# Patient Record
Sex: Female | Born: 1950
Health system: Southern US, Community
[De-identification: ages and names within clinical notes are randomized; demographics above are authoritative.]

## PROBLEM LIST (undated history)

## (undated) DIAGNOSIS — E039 Hypothyroidism, unspecified: Secondary | ICD-10-CM

## (undated) DIAGNOSIS — D649 Anemia, unspecified: Secondary | ICD-10-CM

## (undated) DIAGNOSIS — K449 Diaphragmatic hernia without obstruction or gangrene: Secondary | ICD-10-CM

## (undated) DIAGNOSIS — M545 Low back pain, unspecified: Secondary | ICD-10-CM

## (undated) DIAGNOSIS — M8580 Other specified disorders of bone density and structure, unspecified site: Secondary | ICD-10-CM

## (undated) DIAGNOSIS — K921 Melena: Secondary | ICD-10-CM

## (undated) DIAGNOSIS — K635 Polyp of colon: Secondary | ICD-10-CM

## (undated) DIAGNOSIS — K5900 Constipation, unspecified: Secondary | ICD-10-CM

## (undated) DIAGNOSIS — K219 Gastro-esophageal reflux disease without esophagitis: Secondary | ICD-10-CM

## (undated) DIAGNOSIS — H269 Unspecified cataract: Secondary | ICD-10-CM

## (undated) DIAGNOSIS — M810 Age-related osteoporosis without current pathological fracture: Secondary | ICD-10-CM

## (undated) DIAGNOSIS — T7840XA Allergy, unspecified, initial encounter: Secondary | ICD-10-CM

## (undated) DIAGNOSIS — I447 Left bundle-branch block, unspecified: Secondary | ICD-10-CM

## (undated) DIAGNOSIS — I509 Heart failure, unspecified: Secondary | ICD-10-CM

## (undated) DIAGNOSIS — E785 Hyperlipidemia, unspecified: Secondary | ICD-10-CM

## (undated) DIAGNOSIS — R232 Flushing: Secondary | ICD-10-CM

## (undated) DIAGNOSIS — G459 Transient cerebral ischemic attack, unspecified: Secondary | ICD-10-CM

## (undated) DIAGNOSIS — D219 Benign neoplasm of connective and other soft tissue, unspecified: Secondary | ICD-10-CM

## (undated) DIAGNOSIS — I10 Essential (primary) hypertension: Secondary | ICD-10-CM

## (undated) DIAGNOSIS — M1990 Unspecified osteoarthritis, unspecified site: Secondary | ICD-10-CM

## (undated) DIAGNOSIS — B009 Herpesviral infection, unspecified: Secondary | ICD-10-CM

## (undated) DIAGNOSIS — I639 Cerebral infarction, unspecified: Secondary | ICD-10-CM

## (undated) DIAGNOSIS — R7303 Prediabetes: Secondary | ICD-10-CM

## (undated) DIAGNOSIS — M7070 Other bursitis of hip, unspecified hip: Secondary | ICD-10-CM

## (undated) DIAGNOSIS — F13129 Sedative, hypnotic or anxiolytic abuse with intoxication, unspecified: Secondary | ICD-10-CM

## (undated) HISTORY — DX: Sedative, hypnotic or anxiolytic abuse with intoxication, unspecified: F13.129

## (undated) HISTORY — DX: Left bundle-branch block, unspecified: I44.7

## (undated) HISTORY — DX: Other bursitis of hip, unspecified hip: M70.70

## (undated) HISTORY — DX: Cerebral infarction, unspecified: I63.9

## (undated) HISTORY — PX: COLONOSCOPY: SHX174

## (undated) HISTORY — DX: Other specified disorders of bone density and structure, unspecified site: M85.80

## (undated) HISTORY — DX: Allergy, unspecified, initial encounter: T78.40XA

## (undated) HISTORY — DX: Unspecified cataract: H26.9

## (undated) HISTORY — DX: Constipation, unspecified: K59.00

## (undated) HISTORY — PX: BRAIN SURGERY: SHX531

## (undated) HISTORY — DX: Low back pain: M54.5

## (undated) HISTORY — DX: Herpesviral infection, unspecified: B00.9

## (undated) HISTORY — DX: Gastro-esophageal reflux disease without esophagitis: K21.9

## (undated) HISTORY — DX: Anemia, unspecified: D64.9

## (undated) HISTORY — PX: POLYPECTOMY: SHX149

## (undated) HISTORY — DX: Benign neoplasm of connective and other soft tissue, unspecified: D21.9

## (undated) HISTORY — PX: BREAST SURGERY: SHX581

## (undated) HISTORY — DX: Prediabetes: R73.03

## (undated) HISTORY — DX: Diaphragmatic hernia without obstruction or gangrene: K44.9

## (undated) HISTORY — DX: Low back pain, unspecified: M54.50

## (undated) HISTORY — DX: Essential (primary) hypertension: I10

## (undated) HISTORY — DX: Transient cerebral ischemic attack, unspecified: G45.9

## (undated) HISTORY — DX: Hyperlipidemia, unspecified: E78.5

## (undated) HISTORY — DX: Hypothyroidism, unspecified: E03.9

## (undated) HISTORY — DX: Polyp of colon: K63.5

## (undated) HISTORY — PX: APPENDECTOMY: SHX54

## (undated) HISTORY — DX: Melena: K92.1

## (undated) HISTORY — DX: Age-related osteoporosis without current pathological fracture: M81.0

## (undated) HISTORY — DX: Unspecified osteoarthritis, unspecified site: M19.90

## (undated) HISTORY — PX: EYE SURGERY: SHX253

## (undated) HISTORY — DX: Flushing: R23.2

---

## 1987-12-18 HISTORY — PX: ABDOMINAL HYSTERECTOMY: SHX81

## 1999-12-18 HISTORY — PX: PITUITARY SURGERY: SHX203

## 2000-03-05 ENCOUNTER — Encounter: Admission: RE | Admit: 2000-03-05 | Discharge: 2000-03-05 | Payer: Self-pay | Admitting: Neurosurgery

## 2000-03-05 ENCOUNTER — Encounter: Payer: Self-pay | Admitting: Neurosurgery

## 2000-08-13 ENCOUNTER — Ambulatory Visit (HOSPITAL_COMMUNITY): Admission: RE | Admit: 2000-08-13 | Discharge: 2000-08-13 | Payer: Self-pay | Admitting: Neurosurgery

## 2000-08-13 ENCOUNTER — Encounter: Payer: Self-pay | Admitting: Neurosurgery

## 2000-12-17 HISTORY — PX: THYROID LOBECTOMY: SHX420

## 2001-02-14 ENCOUNTER — Encounter: Payer: Self-pay | Admitting: Neurosurgery

## 2001-02-14 ENCOUNTER — Ambulatory Visit (HOSPITAL_COMMUNITY): Admission: RE | Admit: 2001-02-14 | Discharge: 2001-02-14 | Payer: Self-pay | Admitting: Neurosurgery

## 2001-08-15 ENCOUNTER — Other Ambulatory Visit: Admission: RE | Admit: 2001-08-15 | Discharge: 2001-08-15 | Payer: Self-pay | Admitting: Endocrinology

## 2001-08-26 ENCOUNTER — Other Ambulatory Visit: Admission: RE | Admit: 2001-08-26 | Discharge: 2001-08-26 | Payer: Self-pay | Admitting: Gynecology

## 2001-09-01 ENCOUNTER — Encounter: Admission: RE | Admit: 2001-09-01 | Discharge: 2001-09-01 | Payer: Self-pay | Admitting: Gynecology

## 2001-09-01 ENCOUNTER — Encounter: Payer: Self-pay | Admitting: Gynecology

## 2001-10-10 ENCOUNTER — Encounter (INDEPENDENT_AMBULATORY_CARE_PROVIDER_SITE_OTHER): Payer: Self-pay | Admitting: *Deleted

## 2001-10-11 ENCOUNTER — Inpatient Hospital Stay (HOSPITAL_COMMUNITY): Admission: RE | Admit: 2001-10-11 | Discharge: 2001-10-12 | Payer: Self-pay | Admitting: Otolaryngology

## 2001-12-17 HISTORY — PX: CHOLECYSTECTOMY: SHX55

## 2002-06-26 ENCOUNTER — Encounter: Payer: Self-pay | Admitting: Surgery

## 2002-06-29 ENCOUNTER — Encounter (INDEPENDENT_AMBULATORY_CARE_PROVIDER_SITE_OTHER): Payer: Self-pay | Admitting: Specialist

## 2002-06-29 ENCOUNTER — Observation Stay (HOSPITAL_COMMUNITY): Admission: RE | Admit: 2002-06-29 | Discharge: 2002-06-30 | Payer: Self-pay | Admitting: Surgery

## 2002-06-29 ENCOUNTER — Encounter: Payer: Self-pay | Admitting: Surgery

## 2002-09-23 ENCOUNTER — Ambulatory Visit (HOSPITAL_COMMUNITY): Admission: RE | Admit: 2002-09-23 | Discharge: 2002-09-23 | Payer: Self-pay | Admitting: Internal Medicine

## 2002-09-23 ENCOUNTER — Encounter: Payer: Self-pay | Admitting: Internal Medicine

## 2002-11-09 ENCOUNTER — Encounter: Admission: RE | Admit: 2002-11-09 | Discharge: 2002-12-30 | Payer: Self-pay | Admitting: *Deleted

## 2002-11-11 ENCOUNTER — Other Ambulatory Visit: Admission: RE | Admit: 2002-11-11 | Discharge: 2002-11-11 | Payer: Self-pay | Admitting: Gynecology

## 2002-12-17 HISTORY — PX: KNEE SURGERY: SHX244

## 2003-04-01 ENCOUNTER — Encounter: Admission: RE | Admit: 2003-04-01 | Discharge: 2003-04-01 | Payer: Self-pay | Admitting: Gynecology

## 2003-04-01 ENCOUNTER — Encounter: Payer: Self-pay | Admitting: Gynecology

## 2003-07-04 ENCOUNTER — Inpatient Hospital Stay (HOSPITAL_COMMUNITY): Admission: EM | Admit: 2003-07-04 | Discharge: 2003-07-08 | Payer: Self-pay | Admitting: Psychiatry

## 2003-07-12 ENCOUNTER — Other Ambulatory Visit (HOSPITAL_COMMUNITY): Admission: RE | Admit: 2003-07-12 | Discharge: 2003-07-26 | Payer: Self-pay | Admitting: Psychiatry

## 2003-07-12 ENCOUNTER — Encounter (HOSPITAL_COMMUNITY): Admission: RE | Admit: 2003-07-12 | Discharge: 2003-07-12 | Payer: Self-pay | Admitting: Psychiatry

## 2003-07-22 ENCOUNTER — Other Ambulatory Visit: Admission: RE | Admit: 2003-07-22 | Discharge: 2003-07-22 | Payer: Self-pay | Admitting: Gynecology

## 2003-10-18 ENCOUNTER — Other Ambulatory Visit: Admission: RE | Admit: 2003-10-18 | Discharge: 2003-10-18 | Payer: Self-pay | Admitting: Gynecology

## 2004-10-03 ENCOUNTER — Encounter: Admission: RE | Admit: 2004-10-03 | Discharge: 2004-10-03 | Payer: Self-pay | Admitting: Occupational Medicine

## 2005-08-06 ENCOUNTER — Other Ambulatory Visit: Admission: RE | Admit: 2005-08-06 | Discharge: 2005-08-06 | Payer: Self-pay | Admitting: Gynecology

## 2005-12-17 HISTORY — PX: SHOULDER SURGERY: SHX246

## 2006-01-17 ENCOUNTER — Emergency Department (HOSPITAL_COMMUNITY): Admission: EM | Admit: 2006-01-17 | Discharge: 2006-01-17 | Payer: Self-pay | Admitting: Emergency Medicine

## 2006-02-19 ENCOUNTER — Encounter: Admission: RE | Admit: 2006-02-19 | Discharge: 2006-02-19 | Payer: Self-pay | Admitting: Gynecology

## 2006-08-09 ENCOUNTER — Other Ambulatory Visit: Admission: RE | Admit: 2006-08-09 | Discharge: 2006-08-09 | Payer: Self-pay | Admitting: Gynecology

## 2006-08-13 ENCOUNTER — Ambulatory Visit (HOSPITAL_BASED_OUTPATIENT_CLINIC_OR_DEPARTMENT_OTHER): Admission: RE | Admit: 2006-08-13 | Discharge: 2006-08-13 | Payer: Self-pay | Admitting: Orthopedic Surgery

## 2006-12-17 DIAGNOSIS — M8580 Other specified disorders of bone density and structure, unspecified site: Secondary | ICD-10-CM

## 2006-12-17 HISTORY — DX: Other specified disorders of bone density and structure, unspecified site: M85.80

## 2006-12-27 ENCOUNTER — Other Ambulatory Visit: Admission: RE | Admit: 2006-12-27 | Discharge: 2006-12-27 | Payer: Self-pay | Admitting: Obstetrics and Gynecology

## 2007-09-08 ENCOUNTER — Other Ambulatory Visit: Admission: RE | Admit: 2007-09-08 | Discharge: 2007-09-08 | Payer: Self-pay | Admitting: Obstetrics and Gynecology

## 2008-07-27 ENCOUNTER — Encounter: Admission: RE | Admit: 2008-07-27 | Discharge: 2008-07-27 | Payer: Self-pay | Admitting: Obstetrics and Gynecology

## 2008-09-27 ENCOUNTER — Encounter: Payer: Self-pay | Admitting: Women's Health

## 2008-09-27 ENCOUNTER — Ambulatory Visit: Payer: Self-pay | Admitting: Women's Health

## 2008-09-27 ENCOUNTER — Other Ambulatory Visit: Admission: RE | Admit: 2008-09-27 | Discharge: 2008-09-27 | Payer: Self-pay | Admitting: Obstetrics and Gynecology

## 2008-11-16 ENCOUNTER — Ambulatory Visit: Payer: Self-pay | Admitting: Women's Health

## 2008-11-25 ENCOUNTER — Ambulatory Visit: Payer: Self-pay | Admitting: Obstetrics and Gynecology

## 2009-09-05 ENCOUNTER — Encounter: Admission: RE | Admit: 2009-09-05 | Discharge: 2009-09-05 | Payer: Self-pay | Admitting: Obstetrics and Gynecology

## 2009-09-30 ENCOUNTER — Encounter: Payer: Self-pay | Admitting: Women's Health

## 2009-09-30 ENCOUNTER — Ambulatory Visit: Payer: Self-pay | Admitting: Women's Health

## 2009-09-30 ENCOUNTER — Other Ambulatory Visit: Admission: RE | Admit: 2009-09-30 | Discharge: 2009-09-30 | Payer: Self-pay | Admitting: Obstetrics and Gynecology

## 2010-06-22 ENCOUNTER — Encounter (INDEPENDENT_AMBULATORY_CARE_PROVIDER_SITE_OTHER): Payer: Self-pay | Admitting: *Deleted

## 2010-10-17 ENCOUNTER — Encounter: Admission: RE | Admit: 2010-10-17 | Discharge: 2010-10-17 | Payer: Self-pay | Admitting: Obstetrics and Gynecology

## 2010-10-20 ENCOUNTER — Ambulatory Visit: Payer: Self-pay | Admitting: Women's Health

## 2010-10-20 ENCOUNTER — Other Ambulatory Visit: Admission: RE | Admit: 2010-10-20 | Discharge: 2010-10-20 | Payer: Self-pay | Admitting: Obstetrics and Gynecology

## 2010-11-15 ENCOUNTER — Encounter: Admission: RE | Admit: 2010-11-15 | Discharge: 2010-11-15 | Payer: Self-pay | Admitting: Orthopedic Surgery

## 2010-12-12 ENCOUNTER — Ambulatory Visit: Payer: Self-pay | Admitting: Women's Health

## 2010-12-17 HISTORY — PX: NASAL SEPTUM SURGERY: SHX37

## 2011-01-06 ENCOUNTER — Encounter: Payer: Self-pay | Admitting: Obstetrics and Gynecology

## 2011-01-16 NOTE — Letter (Signed)
Summary: Colonoscopy Letter  [facility]  [address] [place], [place] [address]   Phone: [phone]  Fax: [phone]      June 22, 2010 MRN: [id number]   [facility] [address] [place], [place]  [address]   Dear Ms. Treece,   According to your medical record, it is time for you to schedule a Colonoscopy. The American Cancer Society recommends this procedure as a method to detect early colon cancer. Patients with a family history of colon cancer, or a personal history of colon polyps or inflammatory bowel disease are at increased risk.  This letter has beeen generated based on the recommendations made at the time of your procedure. If you feel that in your particular situation this may no longer apply, please contact our office.  Please call our office at [phone] to schedule this appointment or to update your records at your earliest convenience.  Thank you for cooperating with [place] to provide you with the very best care possible.   Sincerely,  Hedwig Morton. Juanda Chance, M.D.  [facility] [phone]

## 2011-04-05 ENCOUNTER — Ambulatory Visit (INDEPENDENT_AMBULATORY_CARE_PROVIDER_SITE_OTHER): Payer: 59 | Admitting: Sports Medicine

## 2011-04-05 ENCOUNTER — Encounter: Payer: Self-pay | Admitting: Sports Medicine

## 2011-04-05 DIAGNOSIS — M7660 Achilles tendinitis, unspecified leg: Secondary | ICD-10-CM | POA: Insufficient documentation

## 2011-04-05 MED ORDER — NITROGLYCERIN 0.1 MG/HR TD PT24: MEDICATED_PATCH | TRANSDERMAL | Status: DC

## 2011-04-05 NOTE — Assessment & Plan Note (Signed)
-   Left Achilles insertional tendinopathy - MSK ultrasound showed calcaneal heel spur with surrounding edema at the insertion of Achilles tendon with increased Doppler flow. Also noted to have increased width of the Achilles tendon measuring 0.59 cm. -  Will start nitroglycerin protocol 0.1 mg patch apply quarter patch daily. Patient does have history of migraines and cautioned her on possibility of headaches with the patch. She is instructed to stop medication if migraines are occurring. Also counseled on side effects of rash. Plan to increase to half patch daily in the future if not having any side effects. - Start eccentric heel exercises with a 1 inch book as stated in patient instructions. Do these exercises twice daily. - Given 7/16 inch heel lift for her shoes which was comfortable in office today. If heel left is not helpful, was given prescription for it he will drink brace which he could try. - Will return to work 04/16/11 With restrictions to avoid lift truck in any high steps.  Should wear heel lift in her work boots. - Recommend stopping physical therapy at this time and focusing on her home exercises. - Will followup with [place] in 4 weeks for reevaluation and repeat ultrasound, encouraged call with any question or concerns.

## 2011-04-05 NOTE — Progress Notes (Signed)
  Subjective:    Patient ID: Denise [name], female    DOB: September 04, 1951, 60 y.o.   MRN: [id number]  HPI 60 year old female to office for initial evaluation of a left achilles pain. This has been ongoing for over one year, denies any injury or trauma. Pain is mainly at the base of the heel, but occasionally in her lower calf. She has been following with Dr. Victorino Dike for this.  MRI last fall showed no Achilles tendon tear with signs of Achilles tendinosis. She has completed 6 weeks of physical therapy in the fall without much improvement and is currently in physical therapy having completed 4-weeks, but initiated eccentric exercises only one week ago.  She is wearing small heel this in her shoes which have not been helpful. Patient has been out of work, plans to return 04/16/11.  She works at [employer] and is on her feet 10-12 hours a day. She typically wears steel toed boots. She is taking ibuprofen 400 mg as needed. Does have history of similar pain several years ago, had received custom orthotics at that time, most recent orthotics were made 2 years ago. She has not been using these regularly. At this time Dr. Victorino Dike has recommended surgery versus continuing to live with the pain.  PMH: migraine headaches Social: Employed at [employer]   Review of Systems Per history of present illness, denies any numbness or tingling, denies any weakness. Otherwise review of systems negative    Objective:   Physical Exam GENERAL: Alert and oriented x3, in no acute distress, pleasant SKIN: No rashes or lesions MSK: - Feet/ankles: Left ankle with full range of motion without pain. She has normal strength. She has no laxity. She is tender to palpation along the distal insertion of the Achilles on the calcaneus , Does have some surrounding soft tissue swelling in this area. Achilles tendon doese feel thick to palpation, but no palpable nodules are appreciated. patient is able to do a heel raise but with  pain. Left foot with minimal tenderness along the plantar fascia. She has moderate arches, no transverse arch collapse. No significant callus formation. Right foot and ankle with full range of motion without pain, swelling, tenderness. No tenderness over the insertion of the Achilles tendon. No swelling. - Gait: No leg length difference. Walking with antalgic gait  NEURO Sensation intact to light touch VASCULAR: PULSES 2+ AND SYMMETRIC DORSALIS PEDIS AND POSTERIOR TIBIALIS BILATERALLY.  MSK ULTRASOUND: Lt achilles- visible heel spur of the calcaneus with surrounding hypoechoic fluid , significantly increased Doppler flow is appreciated in this area; area well-visualized on short and long scans. Thickening of the Achilles tendon is noted measuring 0.59 cm at the calcaneus, few small areas of hypoechoic change are noted, but no distinct tears appreciated. No increased Doppler flow in these areas. Right Achilles tendon measuring 0.36 cm , she does have small heel spur on that side, but no surrounding hypoechoic fluid or increased Doppler flow. Plantar fascia well visualized on both feet right measuring 0.28 cm and left measuring 0.33 cm. No signs of tearing. Images saved.        Assessment & Plan:

## 2011-04-05 NOTE — Patient Instructions (Signed)
-   You have a small heel spur & small tearing of the achilles where it attaches to the calcaneus.  This is called achilles insertional tendinopathy. - Wear heel lift in all of your shoes. - Start nitroglycerin patch 1/4 of a patch applied to heel - leave in place 24-hrs and change every day.  Common side effects include rash and/or migraine.  If having severe headache/migraine ok to stop. - Start home exercises as noted: - Begin with easy walking to warm up before exercises - Do calf raises on a small book (about 1-inch high):   - Lower on both feet & raise on both feet  - Begin with 3 sets of 15 - try to increase by 5 repetitions every 3-days if not having pain (goal of 3 sets of 30)  - Do with both straight knee & bent knee - Follow-up with [place] in 4 weeks for repeat ultrasound. - You can try the Achillo-train brace if you are not getting much improvement with the lift alone.  You have the prescription for this. - Call with any questions or concerns.

## 2011-05-04 NOTE — Op Note (Signed)
[facility]  Patient:    AMBRY, DIX Visit Number: [id number] MRN: [id number]          Service Type: MED Location: [phone] Attending Physician:  Barbee Cough Dictated by:   Kinnie Scales Annalee Genta, M.D. Proc. Date: 10/10/01 Admit Date:  10/10/2001 Discharge Date: 10/12/2001   CC:         Alfonse Alpers. Dagoberto Ligas, M.D.   Operative Report  PREOPERATIVE DIAGNOSIS:  Right thyroid neoplasm.  POSTOPERATIVE DIAGNOSIS:  Right thyroid neoplasm.  INDICATION FOR PROCEDURE:  Right thyroid neoplasm.  PROCEDURE:  Right hemithyroidectomy.  ANESTHESIA:  General endotracheal/NIMS.  SURGEON:  Kinnie Scales. Annalee Genta, M.D.  ASSISTANT:  Veverly Fells. Arletha Grippe, M.D.  COMPLICATIONS:  None.  ESTIMATED BLOOD LOSS:  Approximately 50 cc.  DISPOSITION:  Patient transferred from the operating room to the recovery room in stable condition.  BRIEF HISTORY:  Ms. Fahrner is a 60 year old white female who was referred for evaluation of a left thyroid mass.  She had undergone transnasal, trasnssphenoidal hypophysectomy in May 2002 and was referred to Alfonse Alpers. Dagoberto Ligas, M.D., for postoperative hormonal management.  Evaluation in his office revealed a left thyroid mass.  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  The patient was referred for surgical evaluation and further management of this disease process. Examination in the office revealed a 2 cm firm mass within the left thyroid lobe.  Preoperative MRI scanning was performed, and there was no evidence of lymphadenopathy or other abnormalities in the neck, no tracheal deviation or esophageal compression.  Given the patients history and findings on fine needle aspiration, I recommended that we undertake a left hemithyroidectomy for excision of the neoplasm and pathologic confirmation.  The risks, benefits, and possible complications of this surgical procedure were  discussed in detail with the patient, who understood and concurred with our plan for surgery, which was scheduled as above.  DESCRIPTION OF PROCEDURE:  The patient was brought to the operating room on October 10, 2001, and placed in the supine position on the operating table. General endotracheal anesthesia was established without difficulty.  When the patient had been adequately anesthetized with a NIMS endotracheal tube, she was positioned, prepped, and draped in a sterile fashion and injected with 5 cc of 1% lidocaine and 1:100,000 solution of epinephrine injected in subcutaneous fashion in the anterior neck skin.  The endotracheal tube was attached to the NIMS monitor and appropriate anatomic location was confirmed through direct percutaneous stimulation.  The surgical procedure was then begun.  The incision line was demarcated on the patients anterior neck skin in a pre-existing skin crease, creating a low collar incision measuring approximately 10 cm.  The incision was carried through the skin and underlying subcutaneous tissue, and subplatysmal flaps were then developed after transection of the medial aspect of the platysma muscle.  Subplatysmal flaps were elevated inferiorly and superiorly, creating a widely open surgical field.  Self-retaining retractors were then placed, and dissection was carried out in the midline.  The midline fascia was divided, and strap muscles were lateralized.  The anterior aspect of the patients neck, trachea, larynx, and thyroid gland were then palpated.  The patient had a firm mass in the left thyroid lobe.  No other palpable abnormality was noted.  The strap muscles were then elevated from the left aspect of the thyroid gland and reflected laterally.  Dissection was carried out along the thyroid capsule, identifying the middle thyroid vein and  dividing and suture ligating.  Dissection was then carried superiorly along the superior thyroid pedicle.   The superior thyroid artery and vein were also identified and divided and suture ligated, and the thyroid was reflected medially.  Attention was then turned to the posterior and inferior aspect of the gland.  Thyroid pedicle was identified on the inferior aspect.  Parathyroid gland was also identified, and the vascular contributions to the thyroid gland were divided and suture ligated.  There was an aberrant thyroid artery extending from the carotid artery to the midaspect of the thyroid gland, which was traced proximally and distally.  The deep fibrofatty tissue was then dissected, and the recurrent laryngeal nerve was identified and confirmed with the NIMS monitor.  The nerve was then traced distally to the level of the larynx, resecting the overlying thyroid gland and dividing and suture ligating individual blood vessels.  The aberrant left thyroid artery was then divided and suture ligated.  The superior parathyroid gland was identified within the depths of the surgical field, not attached to the thyroid gland proper.  The thyroid gland was then reflected medially and Berrys ligament was dissected from the anterior and lateral tracheal wall, preserving the recurrent laryngeal nerve.  The thyroid isthmus was then dissected, crossclamped with Mayo clamps, and divided.  The specimen was sent to pathology for microscopic evaluation.  The initial frozen section diagnosis was consistent with Hurthle cell neoplasm.  The final pathologic diagnosis was deferred to permanent section.  The thyroid isthmus was suture ligated with 2-0 chromic suture in a horizontal mattressing fashion.  With the left thyroid lobe removed, the wound was thoroughly irrigated with saline solution. Several areas of hemorrhage were cauterized with bipolar cautery.  The recurrent laryngeal nerve was intact throughout its course.  It was stimulated with a NIMS stimulator and was fully funcitonal.  Again the pateints neck  was thoroughly irrigated with saline solution.  A 7 mm Blake drain was brought out  through a separate small stab incision in the midline of the neck and placed to the depth of the surgical field.  The wound was closed in multiple layers, reapproximating the strap muscles in the midline with a 3-0 Vicryl suture, reapproximating the platysma muscle with a 4-0 Vicryl suture in an interrupted fashion, deep subcutaneous closure was then achieved with a 4-0 Vicryl suture. Final skin closure was achieved with a 5-0 Ethilon suture in a running locked fashion to reapproximate the skin.  The wound was then dressed with bacitracin ointment.  The patient was awakened from anesthetic, extubated, and was then transferred from the operating room to the recovery room in stable condition. There were no complications.  Estimated blood loss approximately 50 cc. Dictated by:   Kinnie Scales. Annalee Genta, M.D. Attending Physician:  Barbee Cough DD:  10/10/01 TD:  10/13/01 Job: [id number]

## 2011-05-04 NOTE — Discharge Summary (Signed)
NAME:  Denise [name], Denise [name]                       ACCOUNT NO.:  [id number]   MEDICAL RECORD NO.:  [id number]                   PATIENT TYPE:  IPS   LOCATION:  0508                                 FACILITY:  [facility]   PHYSICIAN:  Geoffery Lyons, M.D.                   DATE OF BIRTH:  02/25/1951   DATE OF ADMISSION:  07/04/2003  DATE OF DISCHARGE:  07/08/2003                                 DISCHARGE SUMMARY   CHIEF COMPLAINT AND PRESENT ILLNESS:  This was the first admission to [facility] for this 60 year old divorced white female  voluntarily admitted.  History of depression and suicidal ideation, feeling  worthless for some time, thought about overdosing, difficulty sleeping,  difficulty with appetite, no purpose in life, alone, having some anxiety.   PAST PSYCHIATRIC HISTORY:  First time at [facility].  No previous  treatment.   ALCOHOL/DRUG HISTORY:  Denies the use or abuse of any substances.   PAST MEDICAL HISTORY:  Migraine headaches.   MEDICATIONS:  Premarin 0.9, Bextra 20 mg per day and Maxalt for headache.   PHYSICAL EXAMINATION:  Performed and failed to show any acute findings.   MENTAL STATUS EXAM:  Alert, cooperative female, casually dressed.  Speech  clear.  Mood depressed.  Affect tearful, depressed.  Thought processes  coherent.  Sense of worthlessness, helplessness.  Cognition well-preserved.   ADMISSION DIAGNOSES:   AXIS I:  Major depression, recurrent.   AXIS II:  No diagnosis.   AXIS III:  Migraines.   AXIS IV:  Moderate.   AXIS V:  Global Assessment of Functioning upon admission 30; highest Global  Assessment of Functioning in the last year 65.   LABORATORY DATA:  CBC within normal limits.  Blood chemistry within normal  limits.  Thyroid profile within normal limits.  Drug screen negative for  substances of abuse.   HOSPITAL COURSE:  She was admitted and started intensive individual and  group psychotherapy.  She was given Ambien  for sleep.  She was maintained on  the Premarin 0.9 mg, the Bextra 20 mg per day, hydrochlorothiazide half of  37.5 mg daily, Soma 1 every day, Maxalt for headache.  She was placed on  Lexapro 5 mg per day.  That was increased to 10 mg per day.  She was taking  Soma 350 mg at bedtime.  She endorsed that she was becoming more depressed  for the last three months, especially the last four weeks.  Hopeless,  helpless, isolated, went to work, pushed herself, get home, continued to be  isolated, more anxious, panic attacks in social settings and, due to the  anxiety, unable to get out and contact people.  Ruminations about killing  herself.  Used Paxil for four weeks but felt very numb, so she discontinued  it.  She evidence a lot of depressed mood, tearfulness.  There was a session  with both  daughters.  They were very supportive of her.  She continued to  endorse depressed mood, tearful, thoughts of grief and loss.  Admitted to  suicidal ruminations.  Also issues of trying to find another job.  On July 08, 2003, she was much better.  Mood was improved.  Her affect was brighter.  Endorsed no suicidal or homicidal ideation.  Appeared to address the issues.  Continued to take the antidepressant.  Increased insight in terms of what  she needed to do and committed to do so.   DISCHARGE DIAGNOSES:   AXIS I:  1. Major depression, recurrent.  2. Anxiety disorder not otherwise specified.   AXIS II:  No diagnosis.   AXIS III:  Migraine headaches.   AXIS IV:  Moderate.   AXIS V:  Global Assessment of Functioning upon discharge 55-60.   DISCHARGE MEDICATIONS:  1. Premarin 0.9 mg daily.  2. Bextra 20 mg daily.  3. Hydrochlorothiazide 12.5 mg daily.  4. Lexapro 10 mg daily.  5. Soma 350 mg at night.  6. Imitrex 50 mg.  7. Ambien 10 mg for sleep.   FOLLOW UP:  [facility].                                               Geoffery Lyons, M.D.    IL/MEDQ  D:  08/18/2003   T:  08/20/2003  Job:  [id number]

## 2011-05-04 NOTE — H&P (Signed)
NAME:  Denise [name], Denise [name]                       ACCOUNT NO.:  [id number]   MEDICAL RECORD NO.:  [id number]                   PATIENT TYPE:  REC   LOCATION:  DFTL                                 FACILITY:  [facility]   PHYSICIAN:  Geoffery Lyons, M.D.                   DATE OF BIRTH:  [birth year]   DATE OF ADMISSION:  07/12/2003  DATE OF DISCHARGE:  07/26/2003                         PSYCHIATRIC ADMISSION ASSESSMENT   IDENTIFYING INFORMATION:  The patient is a 60 year old divorced white female  voluntarily admitted on July 04, 2003.   HISTORY OF PRESENT ILLNESS:  The patient presents with a history of  depression and suicidal thoughts.  Feeling worthless for some time.  The  patient thought about overdosing.  She was having difficulty sleeping with  initial and middle insomnia.  She is having some difficulty with appetite.  She has lost a few pounds.  She feels no purpose in her life.  She feels  very alone.  She has been isolating, having some occasional panic attacks.  She denies any psychotic symptoms.   PAST PSYCHIATRIC HISTORY:  This is the first admission to [facility].  No other psychiatric admissions, no outpatient treatment, no  history of a suicide attempt.   SUBSTANCE ABUSE HISTORY:  The patient is a nonsmoker.  She denies any  alcohol or drug use.   PAST MEDICAL HISTORY:  Primary care Laelia Angelo: Dr. Weyman Pedro in [place].  Medical problems: Migraine headaches.   MEDICATIONS:  1. Premarin 0.9 mg daily.  2. Effexor 20 mg daily.  3. Hydrochlorothiazide 37.5 mg one half tablet daily.  4. Soma one p.o. daily.  5. Maxalt 10 mg one at onset of headache, may repeat in two hours p.r.n.   DRUG ALLERGIES:  CODEINE.   REVIEW OF SYSTEMS:  No cardiac problems.  No pulmonary.  History of  headaches.  No hematological problems.  Problems with hemorrhoids on  occasion.  No GU problems, reproductive.  She has two herniated disks,  possible fibromyalgia.  She reports chronic  pain.  She rates her pain  intensity a 3 on a 1-10 scale.   PHYSICAL EXAMINATION:  GENERAL:  The patient is a middle-aged Caucasian  female in no acute distress, overweight.  VITAL SIGNS:  Temperature 97.9, heart rate 82, respiratory rate 16, blood  pressure 125/87.  She is 162 pounds.  She 5 feet 4 inches tall.  HEENT:  Head is normocephalic, atraumatic  NECK:  Negative lymphadenopathy.  CHEST:  Clear to auscultation.  HEART:  Regular rate and rhythm.  ABDOMEN:  Soft, obese, nontender abdomen.  MUSCULOSKELETAL:  Muscle strength and tone is equal bilaterally.  NEUROLOGIC:  Cranial nerves grossly intact.   LABORATORY DATA:  Urinalysis was few bacteria.  Urine drug screen was  negative.  CBC was within normal limits.  TSH was 4.012.  CMET: Alkaline  phosphatase was mildly elevated at 118 with reference range of  39-117.   SOCIAL HISTORY:  This is a 60 year old divorced white female.  She has two  daughters ages 79 and 35.  She lives alone.  She works in [employer].  No  legal problems.   FAMILY HISTORY:  Family history is unclear.   MENTAL STATUS EXAM:  She is an alert, middle-aged, cooperative female,  casually dressed.  Speech is clear.  Mood is depressed.  Affect is tearful  and depressed..  Thought processes are coherent.  The patient feels  worthless, no purpose in life.  Cognitive functioning: Intact.  Memory is  good.  Judgment and insight are fair.   ADMISSION DIAGNOSES:   AXIS I:  Major depression.   AXIS II:  Deferred.   AXIS III:  Migraine headaches.   AXIS IV:  Lack of support, other psychosocial problems related to self-  esteem.   AXIS V:  Current is 30, this past year is 48.   INITIAL PLAN OF CARE:  Plan is a voluntary admission to [facility] for depression and suicidal thoughts.  Contract for safety, check  every 15 minutes.  Will stabilize mood and thinking so the patient can be  safe.  Will initiate an antidepressant to decrease  depressive symptoms.  Medication compliance, risks and benefits were discussed.  Will also  consider individual therapy and followup with a psychiatrist at either  mental health or a private psychiatrist.     Landry Corporal, N.P.                       Geoffery Lyons, M.D.    JO/MEDQ  D:  09/07/2003  T:  09/08/2003  Job:  [id number]

## 2011-05-04 NOTE — Op Note (Signed)
Denise [name], Denise [name]             ACCOUNT NO.:  [id number]   MEDICAL RECORD NO.:  [id number]          PATIENT TYPE:  AMB   LOCATION:  DSC                          FACILITY:  [facility]   PHYSICIAN:  Katy Fitch. Sypher, M.D. DATE OF BIRTH:  [birth year]   DATE OF PROCEDURE:  08/13/2006  DATE OF DISCHARGE:                                 OPERATIVE REPORT   PREOPERATIVE DIAGNOSIS:  Severe right shoulder adhesive capsulitis with  acromioclavicular arthropathy and probable adhesive bursitis.   POSTOPERATIVE DIAGNOSIS:  Extensive adhesive capsulitis with granulation  tissue and scar obliteration of anterior capsular ligaments with  acromioclavicular arthropathy and subacromial subdeltoid adhesive bursitis.   OPERATION:  1. Examination of right shoulder under anesthesia, followed by gentle      manipulation, followed by arthroscopic debridement of granulation      tissue, and tenolysis of subscapular tendon.  2. Arthroscopic subacromial decompression with bursal debridement and      release of coracoacromial ligament and acromioplasty.  3. Arthroscopic distal clavicle resection.   OPERATING SURGEON:  Dr. Josephine Igo.   ASSISTANT:  Denise Rusk PA-C.   ANESTHESIA:  General endotracheal supplemented by interscalene block.  Supervising anesthesiologist is Dr. Krista Blue.   INDICATIONS:  Denise [name] is a 60 year old woman referred for evaluation  and management of a very stiff and painful right shoulder.   She had a history of hypothyroidism.   For the past 6 months has had progressive shoulder pain with loss of range  of motion.  She was referred by her primary care physician, Dr. Eliberto Ivory of  [facility], for evaluation and management of her painful  shoulder, and on examination was noted to have adhesive capsulitis and AC  arthropathy.   An MRI of her shoulder documented degenerative change at the [facility] joint,  subacromial bursitis, no significant glenohumeral arthritis.   We  offered her a course of physical therapy.  Due to her inability to  tolerate the discomfort of therapy and due to other conflicts at work  limiting her ability to travel for therapy, she decided to proceed directly  to manipulation under anesthesia with arthroscopic capsulectomy and  debridement of granulation tissue, followed by anticipated subacromial  decompression and debridement of an adhesive bursitis.   After informed consent, she is brought to the operating room at this time.   Preoperatively we advised her there was a significant possibility that we  would proceed with resection of the distal clavicle.   PROCEDURE:  Denise [name] is brought to the operating room and placed in  supine position on the operating table.   Following an anesthesia consult by Dr. Krista Blue, an interscalene block was  declined.   After informed consent, she is brought to room 6, placed in supine position  on the operating table, and, under Dr. Robina Ade direct supervision, general  endotracheal anesthesia induced.   The right upper extremity and forequarter were prepped with DuraPrep and  draped with impervious arthroscopy drapes.  Examination of the shoulder  under anesthesia revealed an initial range of motion combined elevation 80  degrees, external rotation 40 degrees, internal rotation nil.  After gentle  manipulation we increased the range of motion to combined abduction of 180  degrees, external rotation at 90 degrees, abduction of 90 degrees, and  internal rotation at 90 degrees, abduction of 80 degrees.   The shoulder was then instrumented with an arthroscopic cannula through a  standard posterior viewing portal.  After irrigation of clot from the joint,  photographic documentation of the adhesive capsulitis predicament was  accomplished.  A suction shaver was used to debride scar tissue obliterating  the subscapularis tendon and the anterior glenohumeral ligaments.  The  rotator interval  was reestablished.   The deep surface of the rotator cuff was inspected and found to have some  minor degenerative deep surface changes.  The subscapularis was intact.  The  supraspinatus had a small flap tear that was debrided with the suction  shaver.  The infraspinatus was intact.  The labrum was otherwise normal.   The scope was removed from the glenohumeral joint and placed in the  subacromial space.  Abundant bursal adhesions were noted.  These were lysed  with the arthroscopic shaver, followed by hemostasis with bipolar cautery.  Capsule of AC joint was prominent and the acromion was forming a type 2  medial acromial spur adjacent to the [facility] joint.  A suction shaver was used at  level the acromion to a type 1 morphology, and the coracoacromial ligament  was released with cutting cautery.  Capsule of AC joint was removed,  followed by resection the distal 15 mm of the clavicle, utilizing an  arthroscopic suction bur.  Hemostasis was achieved with bipolar cautery.  After photographic documentation of the resection, hemostasis achieved,  followed by removal of the arthroscopic equipment.   Denise [name]'s subacromial space was infiltrated with 0.25% Marcaine with  epinephrine.  She is placed in a sling and advised to begin immediate range  of motion excises within 12 hours.   She will return to our office for follow up in 24 hours to begin the therapy  program.  She is provided prescriptions for Dilaudid 2 mg 1-2 tablets p.o. 4-  6 hours (30 tablets without refill), also Motrin 600 mg 1 p.o. q.6 hours  p.r.n. pain (30 tablets without refill), and Keflex 500 mg 1 p.o. q.8 hours  times four days as a prophylactic antibiotic.      Katy Fitch Sypher, M.D.  Electronically Signed    RVS/MEDQ  D:  08/13/2006  T:  08/14/2006  Job:  [id number]   cc:   Dr. Weyman Pedro

## 2011-05-04 NOTE — Op Note (Signed)
[facility]  Patient:    Denise [name], Denise [name] Visit Number: [id number] MRN: [id number]          Service Type: SUR Location: 4W [id number] Attending Physician:  Andre Lefort Proc. Date: 06/29/02 Admit Date:  06/29/2002 Discharge Date: 06/30/2002   CC:         Douglass Rivers, M.D.  Weyman Pedro, M.D.  Alfonse Alpers. Dagoberto Ligas, M.D.  Asencion Gowda, M.D.   Operative Report  PREOPERATIVE DIAGNOSIS:  Chronic cholecystitis/cholelithiasis.  POSTOPERATIVE DIAGNOSIS:  Chronic cholecystitis/cholelithiasis with multiple intra-abdominal adhesions from prior abdominal surgery.  PROCEDURE PERFORMED:  Laparoscopic cholecystectomy with intraoperative cholangiogram.  SURGEON:  Sandria Bales. Ezzard Standing, M.D.  ASSISTANT:  Abigail Miyamoto, M.D.  ANESTHESIA:  General endotracheal.  ESTIMATED BLOOD LOSS:  Minimal.  INDICATION:  Denise [name] is a 60 year old white female who has been evaluated for low back pain.  She had an ultrasound of her pelvis and gallbladder by Dr. Douglass Rivers and was found to have a 3.4 cm gallstone.  She has also had some fatty food intolerance.  She now comes for attempted laparoscopic cholecystectomy with intraoperative cholangiogram.  Of note, she had a pituitary tumor removed in 5/02 at [facility] for benign reasons.  She was on steroid replacement.  She has been off that for at least six months.  In speaking with Dr. Dagoberto Ligas, it did not feel that she needed any coverage during this operation.  She also, because of her back pain was interested in having a colonoscopy.  We discussed this with her preoperatively.  She did not complete a mechanical prep for the operation, therefore, she will not have a colonoscopy at the same time.  DESCRIPTION OF PROCEDURE:  The patient is placed in a supine position and given a general endotracheal anesthetic.  She was given 1 g of Ancef at the initiation of the procedure.  Her abdomen was prepped with  Betadine solution and sterilely draped.  An infraumbilical incision was made with sharp dissection carried down to the abdominal cavity.  I did run into a suture that looked like a parading suture and entered the abdominal cavity.  She did have adhesions up above her umbilicus.  I had to strip these down with my finger. I actually went through the left side of her abdomen to get up into the right upper quadrant.  I was able to find a free space in the right upper quadrant. I placed my three additional trocars, a 10 mm subxiphoid, a 5 mm right mid subcostal, and 5 mm lateral subcostal.  I also placed a 12 mm Hasson trocar at the umbilicus and sewed it in place with a 0 Vicryl suture.  Double expression with the laparoscope was limited to the upper abdomen as the lower abdomen was obscured with adhesions.  The upper abdomen and right/left lobes of the liver were unremarkable.  The stomach was unremarkable.  The gallbladder was white consistent with chronic cholecystitis.  The gallbladder was then grasped and rotated cephalad.  Sharp dissection was carried out.  There were some adhesions from the duodenum up to the gallbladder and these were taken down.  The gallbladder was then grasped.  The cystic artery was identified as was the cystic duct.  The cystic artery was triply endoclipped and divided.  A clip was placed on the gallbladder side of the cystic duct and an intraoperative cholangiogram was obtained.  The intraoperative cholangiogram was obtained with a cutoff taut catheter and inserted through a 14-gauge  Jelco catheter, inserted to the side of the cut cystic duct, and hyapaque solution was used to outline the duct.  Under fluoroscopy, the contrast or hyapaque went down to the cystic duct, into the common bile duct, into the duodenum.  There was no stricture, no mass, and no filling defect, and this was felt to be a normal intraoperative cholangiogram.  With a normal intraoperative  cholangiogram, the taut catheter was removed, and the cystic duct triply endoclipped and divided.  The gallbladder was then bluntly and sharply dissected from the gallbladder bed.  Prior to complete division of the gallbladder to the gallbladder bed, the triangle of Calot and the gallbladder bed were again visualized.  There was no bleeding, no bile leak.  The gallbladder was then divided and placed into an endocatch bag and delivered through the umbilicus.  The patient did have about a 3.5 to 4 cm gallstone which was palpable and a somewhat thickened gallbladder wall.  This was then sent to pathology.  The abdomen was irrigated.  Each trocar was inspected and there was no bleeding at any trocar site.  The umbilical trocar was closed with a 0 Vicryl suture.  I used about 12-14 cc of 0.25% Marcaine as a local anesthetic.  The patient was transferred to the recovery room in good condition.  Sponge and needle count were correct. Attending Physician:  Andre Lefort DD:  06/29/02 TD:  07/02/02 Job: [id number]

## 2011-05-17 ENCOUNTER — Ambulatory Visit: Payer: 59 | Admitting: Sports Medicine

## 2011-12-17 ENCOUNTER — Other Ambulatory Visit: Payer: Self-pay

## 2011-12-17 MED ORDER — LEVOTHYROXINE SODIUM 50 MCG PO TABS: 50.0000 ug | ORAL_TABLET | Freq: Every day | ORAL | Status: DC

## 2011-12-17 NOTE — Telephone Encounter (Signed)
Denise [name], chart is on your desk for review. (Last office visit 10/2010). Thanks. ka

## 2011-12-17 NOTE — Telephone Encounter (Signed)
Please call in her Synthroid 50 mcg daily #30 with 1 refill and please have her schedule an annual exam.

## 2011-12-17 NOTE — Telephone Encounter (Signed)
I was unable to reach patient at the ph numbers available to schedule RGCE.  I called the pharmacy and spoke with Harrold Donath and asked him to put a note with the RX letting patient know she must schedule CE for additional refills.

## 2012-02-11 ENCOUNTER — Other Ambulatory Visit: Payer: Self-pay | Admitting: Internal Medicine

## 2012-02-13 ENCOUNTER — Encounter: Payer: Self-pay | Admitting: Women's Health

## 2012-02-13 ENCOUNTER — Ambulatory Visit (INDEPENDENT_AMBULATORY_CARE_PROVIDER_SITE_OTHER): Payer: 59 | Admitting: Women's Health

## 2012-02-13 VITALS — BP 130/90 | Ht 64.75 in | Wt 191.0 lb

## 2012-02-13 DIAGNOSIS — M810 Age-related osteoporosis without current pathological fracture: Secondary | ICD-10-CM | POA: Insufficient documentation

## 2012-02-13 DIAGNOSIS — Z01419 Encounter for gynecological examination (general) (routine) without abnormal findings: Secondary | ICD-10-CM

## 2012-02-13 DIAGNOSIS — Z780 Asymptomatic menopausal state: Secondary | ICD-10-CM

## 2012-02-13 DIAGNOSIS — B009 Herpesviral infection, unspecified: Secondary | ICD-10-CM | POA: Insufficient documentation

## 2012-02-13 DIAGNOSIS — N809 Endometriosis, unspecified: Secondary | ICD-10-CM | POA: Insufficient documentation

## 2012-02-13 DIAGNOSIS — G43909 Migraine, unspecified, not intractable, without status migrainosus: Secondary | ICD-10-CM | POA: Insufficient documentation

## 2012-02-13 DIAGNOSIS — N951 Menopausal and female climacteric states: Secondary | ICD-10-CM

## 2012-02-13 DIAGNOSIS — E039 Hypothyroidism, unspecified: Secondary | ICD-10-CM | POA: Insufficient documentation

## 2012-02-13 DIAGNOSIS — M8580 Other specified disorders of bone density and structure, unspecified site: Secondary | ICD-10-CM | POA: Insufficient documentation

## 2012-02-13 DIAGNOSIS — D219 Benign neoplasm of connective and other soft tissue, unspecified: Secondary | ICD-10-CM | POA: Insufficient documentation

## 2012-02-13 LAB — TSH: TSH: 2.821 u[IU]/mL (ref 0.350–4.500)

## 2012-02-13 MED ORDER — ESTRADIOL 0.5 MG PO TABS: 0.5000 mg | ORAL_TABLET | Freq: Every day | ORAL | Status: DC

## 2012-02-13 MED ORDER — LEVOTHYROXINE SODIUM 50 MCG PO TABS: 50.0000 ug | ORAL_TABLET | Freq: Every day | ORAL | Status: DC

## 2012-02-13 NOTE — Patient Instructions (Addendum)
Colonoscopy zostovac-shingles vaccine  Health Maintenance, Females A healthy lifestyle and preventative care can promote health and wellness.  Maintain regular health, dental, and eye exams.   Eat a healthy diet. Foods like vegetables, fruits, whole grains, low-fat dairy products, and lean protein foods contain the nutrients you need without too many calories. Decrease your intake of foods high in solid fats, added sugars, and salt. Get information about a proper diet from your caregiver, if necessary.   Regular physical exercise is one of the most important things you can do for your health. Most adults should get at least 150 minutes of moderate-intensity exercise (any activity that increases your heart rate and causes you to sweat) each week. In addition, most adults need muscle-strengthening exercises on 2 or more days a week.    Maintain a healthy weight. The body mass index (BMI) is a screening tool to identify possible weight problems. It provides an estimate of body fat based on height and weight. Your caregiver can help determine your BMI, and can help you achieve or maintain a healthy weight. For adults 20 years and older:   A BMI below 18.5 is considered underweight.   A BMI of 18.5 to 24.9 is normal.   A BMI of 25 to 29.9 is considered overweight.   A BMI of 30 and above is considered obese.   Maintain normal blood lipids and cholesterol by exercising and minimizing your intake of saturated fat. Eat a balanced diet with plenty of fruits and vegetables. Blood tests for lipids and cholesterol should begin at age 78 and be repeated every 5 years. If your lipid or cholesterol levels are high, you are over 50, or you are a high risk for heart disease, you may need your cholesterol levels checked more frequently.Ongoing high lipid and cholesterol levels should be treated with medicines if diet and exercise are not effective.   If you smoke, find out from your caregiver how to quit. If  you do not use tobacco, do not start.   If you are pregnant, do not drink alcohol. If you are breastfeeding, be very cautious about drinking alcohol. If you are not pregnant and choose to drink alcohol, do not exceed 1 drink per day. One drink is considered to be 12 ounces (355 mL) of beer, 5 ounces (148 mL) of wine, or 1.5 ounces (44 mL) of liquor.   Avoid use of street drugs. Do not share needles with anyone. Ask for help if you need support or instructions about stopping the use of drugs.   High blood pressure causes heart disease and increases the risk of stroke. Blood pressure should be checked at least every 1 to 2 years. Ongoing high blood pressure should be treated with medicines, if weight loss and exercise are not effective.   If you are 48 to 60 years old, ask your caregiver if you should take aspirin to prevent strokes.   Diabetes screening involves taking a blood sample to check your fasting blood sugar level. This should be done once every 3 years, after age 60, if you are within normal weight and without risk factors for diabetes. Testing should be considered at a younger age or be carried out more frequently if you are overweight and have at least 1 risk factor for diabetes.   Breast cancer screening is essential preventative care for women. You should practice "breast self-awareness." This means understanding the normal appearance and feel of your breasts and may include breast self-examination. Any changes  detected, no matter how small, should be reported to a caregiver. Women in their 13s and 30s should have a clinical breast exam (CBE) by a caregiver as part of a regular health exam every 1 to 3 years. After age 50, women should have a CBE every year. Starting at age 50, women should consider having a mammogram (breast X-ray) every year. Women who have a family history of breast cancer should talk to their caregiver about genetic screening. Women at a high risk of breast cancer should  talk to their caregiver about having an MRI and a mammogram every year.   The Pap test is a screening test for cervical cancer. Women should have a Pap test starting at age 41. Between ages 75 and 60, Pap tests should be repeated every 2 years. Beginning at age 58, you should have a Pap test every 3 years as long as the past 3 Pap tests have been normal. If you had a hysterectomy for a problem that was not cancer or a condition that could lead to cancer, then you no longer need Pap tests. If you are between ages 31 and 68, and you have had normal Pap tests going back 10 years, you no longer need Pap tests. If you have had past treatment for cervical cancer or a condition that could lead to cancer, you need Pap tests and screening for cancer for at least 20 years after your treatment. If Pap tests have been discontinued, risk factors (such as a new sexual partner) need to be reassessed to determine if screening should be resumed. Some women have medical problems that increase the chance of getting cervical cancer. In these cases, your caregiver may recommend more frequent screening and Pap tests.   The human papillomavirus (HPV) test is an additional test that may be used for cervical cancer screening. The HPV test looks for the virus that can cause the cell changes on the cervix. The cells collected during the Pap test can be tested for HPV. The HPV test could be used to screen women aged 68 years and older, and should be used in women of any age who have unclear Pap test results. After the age of 50, women should have HPV testing at the same frequency as a Pap test.   Colorectal cancer can be detected and often prevented. Most routine colorectal cancer screening begins at the age of 26 and continues through age 23. However, your caregiver may recommend screening at an earlier age if you have risk factors for colon cancer. On a yearly basis, your caregiver may provide home test kits to check for hidden blood  in the stool. Use of a small camera at the end of a tube, to directly examine the colon (sigmoidoscopy or colonoscopy), can detect the earliest forms of colorectal cancer. Talk to your caregiver about this at age 2, when routine screening begins. Direct examination of the colon should be repeated every 5 to 10 years through age 41, unless early forms of pre-cancerous polyps or small growths are found.   Practice safe sex. Use condoms and avoid high-risk sexual practices to reduce the spread of sexually transmitted infections (STIs). Sexually active women aged 64 and younger should be checked for Chlamydia, which is a common sexually transmitted infection. Older women with new or multiple partners should also be tested for Chlamydia. Testing for other STIs is recommended if you are sexually active and at increased risk.   Osteoporosis is a disease in which the  bones lose minerals and strength with aging. This can result in serious bone fractures. The risk of osteoporosis can be identified using a bone density scan. Women ages 63 and over and women at risk for fractures or osteoporosis should discuss screening with their caregivers. Ask your caregiver whether you should be taking a calcium supplement or vitamin D to reduce the rate of osteoporosis.   Menopause can be associated with physical symptoms and risks. Hormone replacement therapy is available to decrease symptoms and risks. You should talk to your caregiver about whether hormone replacement therapy is right for you.   Use sunscreen with a sun protection factor (SPF) of 30 or greater. Apply sunscreen liberally and repeatedly throughout the day. You should seek shade when your shadow is shorter than you. Protect yourself by wearing long sleeves, pants, a wide-brimmed hat, and sunglasses year round, whenever you are outdoors.   Notify your caregiver of new moles or changes in moles, especially if there is a change in shape or color. Also notify your  caregiver if a mole is larger than the size of a pencil eraser.   Stay current with your immunizations.  Document Released: 06/18/2011 Document Revised: 08/15/2011 Document Reviewed: 06/18/2011 [facility] Patient Information 2012 [place], [place].

## 2012-02-13 NOTE — Progress Notes (Signed)
Denise [name] Aug 24, 1951 [id number]    History:    The patient presents for annual exam.  Postmenopausal on estradiol 0.5 mg,  history of a TAH/BSO for fibroids and endometriosis. History of normal mammograms. Osteopenia T score of -1.4 at the left femoral neck with FRAX showing no increase fracture risk -12/11. Several vague complaints of some lightheadedness with headaches,occassional pain in the left breast area/chest wall, continued problems with urinary urgency, frequency and occasional incontinence for years. No stress or urge incontinence noted with urodynamic study in 2009, incomplete emptying of the bladder, post void residual 120 was noted. Physical therapy was recommended but did not follow through. Hypothyroid on Synthroid 50 mcg daily. History of a normal colonoscopy  Past medical history, past surgical history, family history and social history were all reviewed and documented in the EPIC chart. Retired, 2 daughters doing well.   ROS:  A  ROS was performed and pertinent positives and negatives are included in the history.  Exam:  Filed Vitals:   02/13/12 1019  BP: 130/90    General appearance:  Normal Head/Neck:  Normal, without cervical or supraclavicular adenopathy. Thyroid:  Symmetrical, normal in size, without palpable masses or nodularity. Respiratory  Effort:  Normal  Auscultation:  Clear without wheezing or rhonchi Cardiovascular  Auscultation:  Regular rate, without rubs, murmurs or gallops  Edema/varicosities:  Not grossly evident Abdominal  Soft,nontender, without masses, guarding or rebound.  Liver/spleen:  No organomegaly noted  Hernia:  None appreciated  Skin  Inspection:  Grossly normal  Palpation:  Grossly normal Neurologic/psychiatric  Orientation:  Normal with appropriate conversation.  Mood/affect:  Normal  Genitourinary    Breasts: Examined lying and sitting.     Right: Without masses, retractions, discharge or axillary  adenopathy.     Left: Without masses, retractions, discharge or axillary adenopathy.   Inguinal/mons:  Normal without inguinal adenopathy  External genitalia:  Normal  BUS/Urethra/Skene's glands:  Normal  Bladder:  Normal  Vagina:  Normal  Cervix:  Absent  Uterus:  Absent  Adnexa/parametria:     Rt: Without masses or tenderness.   Lt: Without masses or tenderness.  Anus and perineum: Normal  Digital rectal exam: Normal sphincter tone without palpated masses or tenderness  Assessment/Plan:  61 y.o. DW F. G2 P2 for annual exam.     Postmenopausal on estradiol 0.5 daily Hypothyroidism-Synthroid 50 mcg daily. Osteopenia T score of -1.4 12/11 Urinary urgency, frequency and occasional incontinence Occasional left breast/chest area pain  Plan: Estradiol 0.5 by mouth daily prescription, proper use, slight risk for blood clots, strokes, breast cancer reviewed. Vagifem at bedtime x2 weeks and then twice weekly for several months to see if that may decrease urinary symptoms. Instructed to call if no relief. SBE's, annual mammogram, exercise, calcium rich diet, vitamin D 2000 daily encouraged. Repeat DEXA 11/2012. Condoms encouraged to become sexually active. Zostovac and Tdap vaccines recommended. Synthroid 50 mcg by mouth daily, prescription, proper use given and reviewed. Due for repeat screening colonoscopy this year will schedule. Instructed to record when notices left chest/breasts discomfort, any precipitating events, what relieves, time of day. Instructed to call 911 if short of breath, jaw pain or arm pain. keep a record for 1 month. Blood pressure borderline high today at 130/90, will check away from office, follow up with primary care as needed.  TSH only, had numerous labs at primary care.    Harrington Challenger [facility], 12:25 PM 02/13/2012

## 2012-02-14 ENCOUNTER — Other Ambulatory Visit: Payer: Self-pay | Admitting: Women's Health

## 2012-02-14 DIAGNOSIS — E039 Hypothyroidism, unspecified: Secondary | ICD-10-CM

## 2012-02-14 MED ORDER — LEVOTHYROXINE SODIUM 75 MCG PO TABS: 75.0000 ug | ORAL_TABLET | Freq: Every day | ORAL | Status: DC

## 2012-03-04 ENCOUNTER — Other Ambulatory Visit: Payer: Self-pay | Admitting: Obstetrics and Gynecology

## 2012-03-04 DIAGNOSIS — Z1231 Encounter for screening mammogram for malignant neoplasm of breast: Secondary | ICD-10-CM

## 2012-03-13 ENCOUNTER — Ambulatory Visit
Admission: RE | Admit: 2012-03-13 | Discharge: 2012-03-13 | Disposition: A | Discharge status: Home / Self Care | Payer: 59 | Source: Ambulatory Visit | Attending: Obstetrics and Gynecology | Admitting: Obstetrics and Gynecology

## 2012-03-13 DIAGNOSIS — Z1231 Encounter for screening mammogram for malignant neoplasm of breast: Secondary | ICD-10-CM

## 2012-04-15 ENCOUNTER — Other Ambulatory Visit: Payer: Self-pay | Admitting: *Deleted

## 2012-04-16 MED ORDER — ELETRIPTAN HYDROBROMIDE 40 MG PO TABS: 40.0000 mg | ORAL_TABLET | ORAL | Status: DC | PRN

## 2012-04-16 NOTE — Telephone Encounter (Signed)
rx called in

## 2012-04-16 NOTE — Telephone Encounter (Signed)
Telephone call, Has been evaluated by nuerologist about headaches, had a negative MRI, has been prescribed Relpax in the past, primary care used to order, he retired, establishing care with a new primary care.

## 2012-08-04 ENCOUNTER — Encounter: Payer: Self-pay | Admitting: Internal Medicine

## 2012-08-04 ENCOUNTER — Telehealth: Payer: Self-pay | Admitting: Internal Medicine

## 2012-08-04 NOTE — Telephone Encounter (Signed)
Recall Project: no contact with pt, letter mailed °

## 2012-08-27 ENCOUNTER — Telehealth: Payer: Self-pay | Admitting: Internal Medicine

## 2012-08-27 NOTE — Telephone Encounter (Signed)
Calling to report over last 6 months, alternates constipation/diarrhea, acid reflux and upper abdominal pain that is intermittent. For the last 3 days, she has had upper abdominal pain under her breast and above navel. She has nausea and feels bloated when she eats. Reports she is under a lot of stress. She reports she received a letter recently from [place] that she is overdue for colonoscopy. Hx- polyps last colon- 08/06/2003. Scheduled patient with Willette Cluster, NP tomorrow at 11:00 AM.

## 2012-08-28 ENCOUNTER — Encounter: Payer: Self-pay | Admitting: Nurse Practitioner

## 2012-08-28 ENCOUNTER — Ambulatory Visit (INDEPENDENT_AMBULATORY_CARE_PROVIDER_SITE_OTHER): Payer: 59 | Admitting: Nurse Practitioner

## 2012-08-28 ENCOUNTER — Ambulatory Visit: Payer: 59

## 2012-08-28 ENCOUNTER — Encounter: Payer: Self-pay | Admitting: Internal Medicine

## 2012-08-28 VITALS — BP 138/94 | HR 82 | Ht 64.75 in | Wt 197.0 lb

## 2012-08-28 DIAGNOSIS — K219 Gastro-esophageal reflux disease without esophagitis: Secondary | ICD-10-CM

## 2012-08-28 DIAGNOSIS — R197 Diarrhea, unspecified: Secondary | ICD-10-CM

## 2012-08-28 DIAGNOSIS — E039 Hypothyroidism, unspecified: Secondary | ICD-10-CM

## 2012-08-28 DIAGNOSIS — R1013 Epigastric pain: Secondary | ICD-10-CM

## 2012-08-28 LAB — CBC WITH DIFFERENTIAL/PLATELET
Basophils Absolute: 0 10*3/uL (ref 0.0–0.1)
Basophils Relative: 0.4 % (ref 0.0–3.0)
Eosinophils Absolute: 0.5 10*3/uL (ref 0.0–0.7)
Eosinophils Relative: 5.6 % — ABNORMAL HIGH (ref 0.0–5.0)
HCT: 43.6 % (ref 36.0–46.0)
Hemoglobin: 14.6 g/dL (ref 12.0–15.0)
Lymphocytes Relative: 23.8 % (ref 12.0–46.0)
Lymphs Abs: 2 10*3/uL (ref 0.7–4.0)
MCHC: 33.6 g/dL (ref 30.0–36.0)
MCV: 87.3 fl (ref 78.0–100.0)
Monocytes Absolute: 0.4 10*3/uL (ref 0.1–1.0)
Monocytes Relative: 5.3 % (ref 3.0–12.0)
Neutro Abs: 5.4 10*3/uL (ref 1.4–7.7)
Neutrophils Relative %: 64.9 % (ref 43.0–77.0)
Platelets: 255 10*3/uL (ref 150.0–400.0)
RBC: 4.99 Mil/uL (ref 3.87–5.11)
RDW: 13.3 % (ref 11.5–14.6)
WBC: 8.4 10*3/uL (ref 4.5–10.5)

## 2012-08-28 MED ORDER — ESOMEPRAZOLE MAGNESIUM 40 MG PO PACK: 40.0000 mg | PACK | Freq: Every day | ORAL | Status: DC

## 2012-08-28 NOTE — Patient Instructions (Addendum)
Please go to the basement level to have your labs drawn.  We scheduled the Endoscopy with Dr. Lina Sar on 09-10-2012.  Directions and brochure provided. We have given you samples of Nexium,Take 1 cap 30 min before lunch.

## 2012-08-29 LAB — COMPREHENSIVE METABOLIC PANEL
ALT: 15 U/L (ref 0–35)
AST: 18 U/L (ref 0–37)
Albumin: 3.9 g/dL (ref 3.5–5.2)
Alkaline Phosphatase: 145 U/L — ABNORMAL HIGH (ref 39–117)
BUN: 8 mg/dL (ref 6–23)
CO2: 29 mEq/L (ref 19–32)
Calcium: 9 mg/dL (ref 8.4–10.5)
Chloride: 106 mEq/L (ref 96–112)
Creatinine, Ser: 0.7 mg/dL (ref 0.4–1.2)
GFR: 90.38 mL/min (ref >60.00)
Glucose, Bld: 95 mg/dL (ref 70–99)
Potassium: 4.5 mEq/L (ref 3.5–5.1)
Sodium: 141 mEq/L (ref 135–145)
Total Bilirubin: 0.4 mg/dL (ref 0.3–1.2)
Total Protein: 7.4 g/dL (ref 6.0–8.3)

## 2012-08-29 LAB — LIPASE: Lipase: 22 U/L (ref 11.0–59.0)

## 2012-08-29 LAB — TSH: TSH: 3.2 u[IU]/mL (ref 0.35–5.50)

## 2012-08-31 ENCOUNTER — Encounter: Payer: Self-pay | Admitting: Nurse Practitioner

## 2012-09-01 DIAGNOSIS — K219 Gastro-esophageal reflux disease without esophagitis: Secondary | ICD-10-CM | POA: Insufficient documentation

## 2012-09-01 DIAGNOSIS — R197 Diarrhea, unspecified: Secondary | ICD-10-CM | POA: Insufficient documentation

## 2012-09-01 NOTE — Progress Notes (Signed)
09/01/2012 Denise [name] [id number] 10/14/1951   HISTORY OF PRESENT ILLNESS: Patient is a 61 year old female who had a screening colonoscopy almost 10 years ago with Dr. Juanda Chance. Findings included only hyperplastic polyps. She presents today for evaluation of upper abdominal pain, new onset GERD and bowel changes. Patient gives a several month history of intermittent epigastric pain, gnawing / achy in nature. Episode last for several days at a time. Caffeine seems to aggravate the pain. No other aggravating factors she can think of. Food doesn't seem to digest but rather sits in her stomach. She feels bloated and nauseated. Over the last 6 months she has developed alternating constipation and diarrhea. No rectal bleeding, no unusual weight loss. She has seen an occasional black stool but does use Bismuth as needed.  NSAID use is infrequent.   Patient gives a several month history of altered bowel habits. She used to be prone to constipation but now having stools which are more loose in consistency. In addition, BMs are more frequent than normal for her. Most BMs are postprandial. No nocturnal diarrhea to speak of. She hasn't started any new meds recently. No recent dietary changes. She received colonoscopy recall letter.   Past Medical History  Diagnosis Date  . Hypothyroidism   . Osteopenia 2008    -1.2 FEMORAL NECK  . Migraine   . Fibroid   . Endometriosis   . HSV-1 (herpes simplex virus 1) infection    Past Surgical History  Procedure Date  . Abdominal hysterectomy 1989    TAH, BSO  . Shoulder surgery 2007    right  . Pituitary surgery 2001  . Thyroid lobectomy 2002  . Cholecystectomy 2003  . Knee surgery 2004    right  . Nasal septum surgery 2012    reports that she has never smoked. She has never used smokeless tobacco. She reports that she does not drink alcohol or use illicit drugs. family history includes Breast cancer in her maternal aunt; Cancer in her mother; and Heart  disease in her father. Allergies  Allergen Reactions  . Tramadol     HALLUCINATIONS, NIGHTMARES      Outpatient Encounter Prescriptions as of 08/28/2012  Medication Sig Dispense Refill  . eletriptan (RELPAX) 40 MG tablet One tablet by mouth at onset of headache. May repeat in 2 hours if headache persists or recurs. may repeat in 2 hours if necessary  40 tablet  0  . estradiol (ESTRACE) 0.5 MG tablet Take 1 tablet (0.5 mg total) by mouth daily.  30 tablet  12  . levothyroxine (SYNTHROID) 50 MCG tablet Take 1 tablet (50 mcg total) by mouth daily.  30 tablet  12  . ranitidine (ZANTAC) 150 MG tablet Take 150 mg by mouth as needed.      Marland Kitchen esomeprazole (NEXIUM) 40 MG packet Take 40 mg by mouth daily before breakfast.  30 each  0  . DISCONTD: eletriptan (RELPAX) 40 MG tablet One tablet by mouth as needed for migraine headache.  If the headache improves and then returns, dose may be repeated after 2 hours have elapsed since first dose (do not exceed 80 mg per day). may repeat in 2 hours if necessary      . DISCONTD: levothyroxine (SYNTHROID) 75 MCG tablet Take 1 tablet (75 mcg total) by mouth daily. Take Monday, Wed, and Friday only  30 tablet  0  . DISCONTD: nitroGLYCERIN (NITRODUR - DOSED IN MG/24 HR) 0.1 mg/hr Apply 1/4 patch to left achilles - leave in  place for 24-hours.  Apply new patch daily.  15 patch  3     REVIEW OF SYSTEMS  : All other systems reviewed and negative except where noted in the History of Present Illness.   PHYSICAL EXAM: BP 138/94  Pulse 82  Ht 5' 4.75" (1.645 m)  Wt 197 lb (89.359 kg)  BMI 33.04 kg/m2 General: Well developed white female in no acute distress Head: Normocephalic and atraumatic Eyes:  sclerae anicteric,conjunctive pink. Ears: Normal auditory acuity Neck: Supple, no masses.  Lungs: Clear throughout to auscultation Heart: Regular rate and rhythm Abdomen: Soft, non distended, nontender. No masses or hepatomegaly noted. Normal Bowel  sounds Musculoskeletal: Symmetrical with no gross deformities  Skin: No lesions on visible extremities Extremities: No edema or deformities noted Neurological: Alert oriented x 4, grossly nonfocal Cervical Nodes:  No significant cervical adenopathy Psychological:  Alert and cooperative. Normal mood and affect  ASSESSMENT AND PLAN:  70. 61 year old female with several month history of intermittent epigastric pain.  Patient is status post cholecystectomy. Rule out peptic ulcer disease. Pancreatic neoplasm less likely. This could be dyspepsia. Will obtain basic labs today. Trial of a daily PPI. Patient needs an EGD for further evaluation. She is due for colonoscopy for screening purposes. I spoke with patient about getting upper endoscopy done as soon as possible as opposed to waiting until both colonoscopy and upper endoscopy could be done together. Patient agrees, she would like to get upper endoscopy done as soon as possible.The benefits, risks, and potential complications of EGD with possible biopsies and/or dilation were discussed with the patient and she agrees to proceed.   2. altered bowel habits consisting of alternating constipation and diarrhea. Patient has been under a lot of stress. She was tearful at the beginning of her visit. Altered bowel habits could be irritable bowel. Need to exclude underlying pathology such as inflammatory bowel disease, colon neoplasm, et Karie Soda. Further evaluation at time of colonoscopy.  4. Colon cancer screening. Patient is due for screening colonoscopy, she received recall letter. Last colonoscopy August 2004. She had some hyperplastic polyps removed. Colonoscopy will be scheduled for sometime in October or November. The risks, benefits, and alternatives to colonoscopy with possible biopsy and possible polypectomy were discussed with the patient and they consent to proceed.

## 2012-09-01 NOTE — Progress Notes (Signed)
Reviewed and agree with management plan. Olegario Emberson T. Jaiya Mooradian MD FACG 

## 2012-09-10 ENCOUNTER — Encounter: Payer: Self-pay | Admitting: Internal Medicine

## 2012-09-10 ENCOUNTER — Ambulatory Visit (AMBULATORY_SURGERY_CENTER): Payer: 59 | Admitting: Internal Medicine

## 2012-09-10 VITALS — BP 143/76 | HR 68 | Temp 98.2°F | Resp 21 | Ht 64.0 in | Wt 197.0 lb

## 2012-09-10 DIAGNOSIS — D126 Benign neoplasm of colon, unspecified: Secondary | ICD-10-CM

## 2012-09-10 DIAGNOSIS — K2281 Esophageal polyp: Secondary | ICD-10-CM

## 2012-09-10 DIAGNOSIS — K228 Other specified diseases of esophagus: Secondary | ICD-10-CM

## 2012-09-10 DIAGNOSIS — K2960 Other gastritis without bleeding: Secondary | ICD-10-CM

## 2012-09-10 DIAGNOSIS — R1013 Epigastric pain: Secondary | ICD-10-CM

## 2012-09-10 MED ORDER — SODIUM CHLORIDE 0.9 % IV SOLN: 500.0000 mL | INTRAVENOUS | Status: DC

## 2012-09-10 MED ORDER — HYOSCYAMINE SULFATE 0.125 MG SL SUBL: 0.1250 mg | SUBLINGUAL_TABLET | SUBLINGUAL | Status: DC | PRN

## 2012-09-10 NOTE — Progress Notes (Signed)
The pt tolerated the egd very well. Maw   

## 2012-09-10 NOTE — Progress Notes (Signed)
Patient did not experience any of the following events: a burn prior to discharge; a fall within the facility; wrong site/side/patient/procedure/implant event; or a hospital transfer or hospital admission upon discharge from the facility. (G8907) Patient did not have preoperative order for IV antibiotic SSI prophylaxis. (G8918)  

## 2012-09-10 NOTE — Patient Instructions (Signed)

## 2012-09-10 NOTE — Op Note (Signed)
[facility] [address] [place], [address]   ENDOSCOPY PROCEDURE REPORT  PATIENT: Denise, [name]  MR#: [id number] BIRTHDATE: 02-27-1951 , 61  yrs. old GENDER: Female ENDOSCOPIST: Hart Carwin, MD REFERRED BY:  Elby Showers, M.D. PROCEDURE DATE:  09/10/2012 PROCEDURE:  EGD w/ biopsy ASA CLASS:     Class I INDICATIONS:  epigastric pain, improved on nexiem, s/p remote chole.  MEDICATIONS: MAC sedation, administered by CRNA and Propofol (Diprivan) 200 mg IV TOPICAL ANESTHETIC: Cetacaine Spray  DESCRIPTION OF PROCEDURE: After the risks benefits and alternatives of the procedure were thoroughly explained, informed consent was obtained.  The    endoscope was introduced through the mouth and advanced to the second portion of the duodenum. Without limitations.  The instrument was slowly withdrawn as the mucosa was fully examined.      ESOPHAGUS: A firm flat polyp ranging between 3-1mm in size was found in the middle third of the esophagus.  A polypectomy was performed with a cold forceps.  The resection was complete and the polyp tissue was completely retrieved.   A 2 cm hiatal hernia was noted.   STOMACH: Mild gastritis (inflammation) with hemorrhage was found in the gastric antrum.  A biopsy was performed using cold forceps. Sample obtained for helicobacter pylori testing.  Retroflexed views revealed no abnormalities.     The scope was then withdrawn from the patient and the procedure completed.  COMPLICATIONS: There were no complications. ENDOSCOPIC IMPRESSION: 1.   Flat polyp ranging between 3-44mm in size was found in the middle third of the esophagus 2.   2 cm hiatal hernia ,sliding reducible hernis with irregular z-line, biopsies taket to r/o Barrett's esophagus 3.   Gastritis (inflammation) was found in the gastric antrum; biopsy , minimal erythema. Bx's to r/o H.pylori  RECOMMENDATIONS: 1.  Await pathology results 2.  continue  PPI 3.add Levsin SL.125 mg prn spasm  REPEAT EXAM:no recall  eSigned:  Hart Carwin, MD 09/10/2012 8:26 AM   CC:  PATIENT NAME:  Denise, [name] MR#: [id number]

## 2012-09-11 ENCOUNTER — Telehealth: Payer: Self-pay | Admitting: *Deleted

## 2012-09-11 NOTE — Telephone Encounter (Signed)
No answer, left message to call office if questions or concerns. 

## 2012-09-15 ENCOUNTER — Encounter: Payer: Self-pay | Admitting: Internal Medicine

## 2012-09-24 ENCOUNTER — Telehealth: Payer: Self-pay | Admitting: Women's Health

## 2012-09-24 ENCOUNTER — Other Ambulatory Visit: Payer: Self-pay | Admitting: Women's Health

## 2012-09-24 DIAGNOSIS — E039 Hypothyroidism, unspecified: Secondary | ICD-10-CM

## 2012-09-24 MED ORDER — LEVOTHYROXINE SODIUM 75 MCG PO TABS: 75.0000 ug | ORAL_TABLET | Freq: Every day | ORAL | Status: DC

## 2012-09-24 NOTE — Progress Notes (Signed)
Telephone call to review TSH, TSH 3.2 will continue with Synthroid 75 Monday Wednesday Friday and Synthroid 50 Tuesday, Thursday, Saturday and Sunday. Prescriptions reviewed.

## 2012-09-25 NOTE — Telephone Encounter (Signed)
Telephone call to review Synthroid, Synthroid Tuesday, Thursday, Saturday, Sunday. Synthroid 75 Monday Wednesday Friday.

## 2012-10-16 ENCOUNTER — Telehealth: Payer: Self-pay | Admitting: *Deleted

## 2012-10-16 ENCOUNTER — Other Ambulatory Visit: Payer: Self-pay | Admitting: Women's Health

## 2012-10-16 DIAGNOSIS — G43909 Migraine, unspecified, not intractable, without status migrainosus: Secondary | ICD-10-CM

## 2012-10-16 MED ORDER — NARATRIPTAN HCL 2.5 MG PO TABS: 2.5000 mg | ORAL_TABLET | ORAL | Status: DC | PRN

## 2012-10-16 NOTE — Telephone Encounter (Signed)
Pt said her insurance will no longer pay for her relpax 40 mg tablets. Her insurance will pay for Amerge Rx. Pt is requesting this rx if possible. Please advise

## 2012-10-16 NOTE — Telephone Encounter (Signed)
Telephone call, reviewed Amerge 2.5 prescription called in. States migraines have decreased as she has aged but continues with migraines.

## 2012-12-02 ENCOUNTER — Telehealth: Payer: Self-pay | Admitting: *Deleted

## 2012-12-02 MED ORDER — VALACYCLOVIR HCL 500 MG PO TABS: 500.0000 mg | ORAL_TABLET | Freq: Two times a day (BID) | ORAL | Status: DC

## 2012-12-02 NOTE — Telephone Encounter (Signed)
Pt called requesting valtrex 500 mg tablet. Pt has history of HSV. rx sent.

## 2013-01-05 ENCOUNTER — Other Ambulatory Visit: Payer: Self-pay | Admitting: Oncology

## 2013-01-20 ENCOUNTER — Other Ambulatory Visit: Payer: Self-pay | Admitting: *Deleted

## 2013-01-20 MED ORDER — VALACYCLOVIR HCL 500 MG PO TABS: 500.0000 mg | ORAL_TABLET | Freq: Two times a day (BID) | ORAL | Status: DC | PRN

## 2013-01-20 NOTE — Telephone Encounter (Signed)
Pt called requesting refill on Valtrex 500 mg, rx sent.

## 2013-01-21 ENCOUNTER — Other Ambulatory Visit: Payer: Self-pay

## 2013-01-21 MED ORDER — VALACYCLOVIR HCL 500 MG PO TABS: 500.0000 mg | ORAL_TABLET | Freq: Two times a day (BID) | ORAL | Status: DC | PRN

## 2013-01-21 NOTE — Telephone Encounter (Signed)
I will ask front desk scheduler to call patient to schedule CE.

## 2013-01-22 ENCOUNTER — Other Ambulatory Visit: Payer: Self-pay | Admitting: *Deleted

## 2013-01-22 DIAGNOSIS — Z780 Asymptomatic menopausal state: Secondary | ICD-10-CM

## 2013-01-22 MED ORDER — ESTRADIOL 0.5 MG PO TABS: 0.5000 mg | ORAL_TABLET | Freq: Every day | ORAL | Status: DC

## 2013-03-03 ENCOUNTER — Telehealth: Payer: Self-pay | Admitting: *Deleted

## 2013-03-03 NOTE — Telephone Encounter (Signed)
Yes, can you send referral letter?  Thanks  Omega Slager

## 2013-03-03 NOTE — Telephone Encounter (Signed)
(  pt aware you are out of the office) Pt called requesting if you would be will to refer her to a neurologist due to her headaches. Pt has found a MD in [place] has appointment scheduled on 03/09/13 @ 11:30 but she must have referral to confirm.   Okay to send referral? Please advise

## 2013-03-04 NOTE — Telephone Encounter (Signed)
Notes faxed to [facility] at [phone] (fax) [phone] (office #)

## 2013-03-05 NOTE — Telephone Encounter (Signed)
Pt informed referral sent

## 2013-03-26 ENCOUNTER — Other Ambulatory Visit: Payer: Self-pay | Admitting: Orthopedic Surgery

## 2013-03-26 ENCOUNTER — Ambulatory Visit
Admission: RE | Admit: 2013-03-26 | Discharge: 2013-03-26 | Disposition: A | Discharge status: Home / Self Care | Payer: 59 | Source: Ambulatory Visit | Attending: Orthopedic Surgery | Admitting: Orthopedic Surgery

## 2013-03-26 ENCOUNTER — Other Ambulatory Visit: Payer: 59

## 2013-03-26 DIAGNOSIS — M545 Low back pain, unspecified: Secondary | ICD-10-CM

## 2013-04-02 ENCOUNTER — Telehealth: Payer: Self-pay | Admitting: Gastroenterology

## 2013-04-02 NOTE — Telephone Encounter (Signed)
Pt called c/o crampy abdominal pain.  Taking 1 levsin s.l.prn without much improvement.  No nausea, vomiting, pyrosis. Instructed to increase levsin to 2 tabs s.l. q4h prn

## 2013-04-27 ENCOUNTER — Telehealth: Payer: Self-pay | Admitting: Internal Medicine

## 2013-04-27 MED ORDER — HYOSCYAMINE SULFATE 0.125 MG SL SUBL: 0.1250 mg | SUBLINGUAL_TABLET | SUBLINGUAL | Status: DC | PRN

## 2013-04-27 NOTE — Telephone Encounter (Signed)
Per pt she is out of town and is requesting a refill for hyoscyamine (LEVSIN SL) 0.125 MG SL tablet. She is requesting it be sent to the pharmacy near her. [facility]  [facility] [address] [place]  [phone]. Pt is asking that she be called if it can be refilled or not, because she's on vacation and this isn't her usual pharmacy.

## 2013-04-27 NOTE — Telephone Encounter (Signed)
Spoke with patient and refill sent to [facility]. Patient wants to schedule OV also. Scheduled on 05/29/13 at 2:00 PM.

## 2013-05-29 ENCOUNTER — Telehealth: Payer: Self-pay | Admitting: Internal Medicine

## 2013-05-29 ENCOUNTER — Ambulatory Visit: Payer: 59 | Admitting: Internal Medicine

## 2013-05-29 NOTE — Telephone Encounter (Signed)
No charge. 

## 2013-07-01 ENCOUNTER — Telehealth: Payer: Self-pay | Admitting: *Deleted

## 2013-07-01 DIAGNOSIS — Z780 Asymptomatic menopausal state: Secondary | ICD-10-CM

## 2013-07-01 MED ORDER — ESTRADIOL 0.5 MG PO TABS: 0.5000 mg | ORAL_TABLET | Freq: Every day | ORAL | Status: DC

## 2013-07-01 NOTE — Telephone Encounter (Signed)
Pt called requesting refill on estradiol 0.5 mg, annual scheduled 07/17/13. # 30 will be sent with 0 refills.

## 2013-07-17 ENCOUNTER — Ambulatory Visit (INDEPENDENT_AMBULATORY_CARE_PROVIDER_SITE_OTHER): Payer: 59 | Admitting: Women's Health

## 2013-07-17 ENCOUNTER — Encounter: Payer: Self-pay | Admitting: Women's Health

## 2013-07-17 VITALS — BP 134/82 | Ht 64.75 in | Wt 193.0 lb

## 2013-07-17 DIAGNOSIS — N951 Menopausal and female climacteric states: Secondary | ICD-10-CM

## 2013-07-17 DIAGNOSIS — Z833 Family history of diabetes mellitus: Secondary | ICD-10-CM

## 2013-07-17 DIAGNOSIS — Z13220 Encounter for screening for lipoid disorders: Secondary | ICD-10-CM

## 2013-07-17 DIAGNOSIS — Z01419 Encounter for gynecological examination (general) (routine) without abnormal findings: Secondary | ICD-10-CM

## 2013-07-17 DIAGNOSIS — Z780 Asymptomatic menopausal state: Secondary | ICD-10-CM

## 2013-07-17 DIAGNOSIS — E039 Hypothyroidism, unspecified: Secondary | ICD-10-CM

## 2013-07-17 DIAGNOSIS — F411 Generalized anxiety disorder: Secondary | ICD-10-CM

## 2013-07-17 LAB — CBC WITH DIFFERENTIAL/PLATELET
Basophils Absolute: 0 10*3/uL (ref 0.0–0.1)
Basophils Relative: 0 % (ref 0–1)
Eosinophils Absolute: 0.3 10*3/uL (ref 0.0–0.7)
Eosinophils Relative: 4 % (ref 0–5)
HCT: 42.1 % (ref 36.0–46.0)
Hemoglobin: 14.5 g/dL (ref 12.0–15.0)
Lymphocytes Relative: 32 % (ref 12–46)
Lymphs Abs: 2.7 10*3/uL (ref 0.7–4.0)
MCH: 28.5 pg (ref 26.0–34.0)
MCHC: 34.4 g/dL (ref 30.0–36.0)
MCV: 82.9 fL (ref 78.0–100.0)
Monocytes Absolute: 0.4 10*3/uL (ref 0.1–1.0)
Monocytes Relative: 5 % (ref 3–12)
Neutro Abs: 5 10*3/uL (ref 1.7–7.7)
Neutrophils Relative %: 59 % (ref 43–77)
Platelets: 320 10*3/uL (ref 150–400)
RBC: 5.08 MIL/uL (ref 3.87–5.11)
RDW: 13.6 % (ref 11.5–15.5)
WBC: 8.5 10*3/uL (ref 4.0–10.5)

## 2013-07-17 LAB — LIPID PANEL
Cholesterol: 176 mg/dL (ref 0–200)
HDL: 60 mg/dL (ref >39)
LDL Cholesterol: 79 mg/dL (ref 0–99)
Total CHOL/HDL Ratio: 2.9 Ratio
Triglycerides: 183 mg/dL — ABNORMAL HIGH (ref <150)
VLDL: 37 mg/dL (ref 0–40)

## 2013-07-17 LAB — GLUCOSE, RANDOM: Glucose, Bld: 108 mg/dL — ABNORMAL HIGH (ref 70–99)

## 2013-07-17 MED ORDER — ESTRADIOL 0.5 MG PO TABS: 0.5000 mg | ORAL_TABLET | Freq: Every day | ORAL | Status: DC

## 2013-07-17 MED ORDER — LEVOTHYROXINE SODIUM 75 MCG PO TABS: 75.0000 ug | ORAL_TABLET | Freq: Every day | ORAL | Status: DC

## 2013-07-17 MED ORDER — ESCITALOPRAM OXALATE 10 MG PO TABS: 10.0000 mg | ORAL_TABLET | Freq: Every day | ORAL | Status: DC

## 2013-07-17 NOTE — Patient Instructions (Addendum)
Health Recommendations for Postmenopausal Women Respected and ongoing research has looked at the most common causes of death, disability, and poor quality of life in postmenopausal women. The causes include heart disease, diseases of blood vessels, diabetes, depression, cancer, and bone loss (osteoporosis). Many things can be done to help lower the chances of developing these and other common problems: CARDIOVASCULAR DISEASE Heart Disease: A heart attack is a medical emergency. Know the signs and symptoms of a heart attack. Below are things women can do to reduce their risk for heart disease.   Do not smoke. If you smoke, quit.  Aim for a healthy weight. Being overweight causes many preventable deaths. Eat a healthy and balanced diet and drink an adequate amount of liquids.  Get moving. Make a commitment to be more physically active. Aim for 30 minutes of activity on most, if not all days of the week.  Eat for heart health. Choose a diet that is low in saturated fat and cholesterol and eliminate trans fat. Include whole grains, vegetables, and fruits. Read and understand the labels on food containers before buying.  Know your numbers. Ask your caregiver to check your blood pressure, cholesterol (total, HDL, LDL, triglycerides) and blood glucose. Work with your caregiver on improving your entire clinical picture.  High blood pressure. Limit or stop your table salt intake (try salt substitute and food seasonings). Avoid salty foods and drinks. Read labels on food containers before buying. Eating well and exercising can help control high blood pressure. STROKE  Stroke is a medical emergency. Stroke may be the result of a blood clot in a blood vessel in the brain or by a brain hemorrhage (bleeding). Know the signs and symptoms of a stroke. To lower the risk of developing a stroke:  Avoid fatty foods.  Quit smoking.  Control your diabetes, blood pressure, and irregular heart rate. THROMBOPHLEBITIS  (BLOOD CLOT) OF THE LEG  Becoming overweight and leading a stationary lifestyle may also contribute to developing blood clots. Controlling your diet and exercising will help lower the risk of developing blood clots. CANCER SCREENING  Breast Cancer: Take steps to reduce your risk of breast cancer.  You should practice "breast self-awareness." This means understanding the normal appearance and feel of your breasts and should include breast self-examination. Any changes detected, no matter how small, should be reported to your caregiver.  After age 40, you should have a clinical breast exam (CBE) every year.  Starting at age 40, you should consider having a mammogram (breast X-ray) every year.  If you have a family history of breast cancer, talk to your caregiver about genetic screening.  If you are at high risk for breast cancer, talk to your caregiver about having an MRI and a mammogram every year.  Intestinal or Stomach Cancer: Tests to consider are a rectal exam, fecal occult blood, sigmoidoscopy, and colonoscopy. Women who are high risk may need to be screened at an earlier age and more often.  Cervical Cancer:  Beginning at age 30, you should have a Pap test every 3 years as long as the past 3 Pap tests have been normal.  If you have had past treatment for cervical cancer or a condition that could lead to cancer, you need Pap tests and screening for cancer for at least 20 years after your treatment.  If you had a hysterectomy for a problem that was not cancer or a condition that could lead to cancer, then you no longer need Pap tests.    If you are between ages 65 and 70, and you have had normal Pap tests going back 10 years, you no longer need Pap tests.  If Pap tests have been discontinued, risk factors (such as a new sexual partner) need to be reassessed to determine if screening should be resumed.  Some medical problems can increase the chance of getting cervical cancer. In these  cases, your caregiver may recommend more frequent screening and Pap tests.  Uterine Cancer: If you have vaginal bleeding after reaching menopause, you should notify your caregiver.  Ovarian cancer: Other than yearly pelvic exams, there are no reliable tests available to screen for ovarian cancer at this time except for yearly pelvic exams.  Lung Cancer: Yearly chest X-rays can detect lung cancer and should be done on high risk women, such as cigarette smokers and women with chronic lung disease (emphysema).  Skin Cancer: A complete body skin exam should be done at your yearly examination. Avoid overexposure to the sun and ultraviolet light lamps. Use a strong sun block cream when in the sun. All of these things are important in lowering the risk of skin cancer. MENOPAUSE Menopause Symptoms: Hormone therapy products are effective for treating symptoms associated with menopause:  Moderate to severe hot flashes.  Night sweats.  Mood swings.  Headaches.  Tiredness.  Loss of sex drive.  Insomnia.  Other symptoms. Hormone replacement carries certain risks, especially in older women. Women who use or are thinking about using estrogen or estrogen with progestin treatments should discuss that with their caregiver. Your caregiver will help you understand the benefits and risks. The ideal dose of hormone replacement therapy is not known. The Food and Drug Administration (FDA) has concluded that hormone therapy should be used only at the lowest doses and for the shortest amount of time to reach treatment goals.  OSTEOPOROSIS Protecting Against Bone Loss and Preventing Fracture: If you use hormone therapy for prevention of bone loss (osteoporosis), the risks for bone loss must outweigh the risk of the therapy. Ask your caregiver about other medications known to be safe and effective for preventing bone loss and fractures. To guard against bone loss or fractures, the following is recommended:  If  you are less than age 50, take 1000 mg of calcium and at least 600 mg of Vitamin D per day.  If you are greater than age 50 but less than age 70, take 1200 mg of calcium and at least 600 mg of Vitamin D per day.  If you are greater than age 70, take 1200 mg of calcium and at least 800 mg of Vitamin D per day. Smoking and excessive alcohol intake increases the risk of osteoporosis. Eat foods rich in calcium and vitamin D and do weight bearing exercises several times a week as your caregiver suggests. DIABETES Diabetes Melitus: If you have Type I or Type 2 diabetes, you should keep your blood sugar under control with diet, exercise and recommended medication. Avoid too many sweets, starchy and fatty foods. Being overweight can make control more difficult. COGNITION AND MEMORY Cognition and Memory: Menopausal hormone therapy is not recommended for the prevention of cognitive disorders such as Alzheimer's disease or memory loss.  DEPRESSION  Depression may occur at any age, but is common in elderly women. The reasons may be because of physical, medical, social (loneliness), or financial problems and needs. If you are experiencing depression because of medical problems and control of symptoms, talk to your caregiver about this. Physical activity and   exercise may help with mood and sleep. Community and volunteer involvement may help your sense of value and worth. If you have depression and you feel that the problem is getting worse or becoming severe, talk to your caregiver about treatment options that are best for you. ACCIDENTS  Accidents are common and can be serious in the elderly woman. Prepare your house to prevent accidents. Eliminate throw rugs, place hand bars in the bath, shower and toilet areas. Avoid wearing high heeled shoes or walking on wet, snowy, and icy areas. Limit or stop driving if you have vision or hearing problems, or you feel you are unsteady with you movements and  reflexes. HEPATITIS C Hepatitis C is a type of viral infection affecting the liver. It is spread mainly through contact with blood from an infected person. It can be treated, but if left untreated, it can lead to severe liver damage over years. Many people who are infected do not know that the virus is in their blood. If you are a "baby-boomer", it is recommended that you have one screening test for Hepatitis C. IMMUNIZATIONS  Several immunizations are important to consider having during your senior years, including:   Tetanus, diptheria, and pertussis booster shot.  Influenza every year before the flu season begins.  Pneumonia vaccine.  Shingles vaccine.  Others as indicated based on your specific needs. Talk to your caregiver about these. Document Released: 01/25/2006 Document Revised: 11/19/2012 Document Reviewed: 09/20/2008 [employer] Patient Information 2014 [employer].  

## 2013-07-17 NOTE — Progress Notes (Signed)
Denise [name] 04/01/1951 [id number]    History:    The patient presents for annual exam.  TAH with BSO 1989 for endometriosis and fibroids, had recently stopped estradiol 0.5 but states felt better when she was taking. Osteopenic T score -1.4 at left femoral neck. FRAX 6.7%/0.4%. Due for colonoscopy in the process of scheduling. Normal Pap and mammogram history. Thyroid lobectomy in 2002 on Synthroid 75 mcg. History of HSV 1 with rare outbreaks. History of depression states is struggling with some situational depression due to daughters situation.   Past medical history, past surgical history, family history and social history were all reviewed and documented in the EPIC chart. Retired from [employer] and [employer]. Moved closer to her daughter to help with granddaughter, son-in-law does not want family involvement. Cholecystectomy 03, knee surgery 04, shoulder surgery 04/27/23. Mother died of brain tumor at age 54. Father heart disease.   ROS:  A  ROS was performed and pertinent positives and negatives are included in the history.  Exam:  Filed Vitals:   07/17/13 1600  BP: 134/82    General appearance:  Normal Head/Neck:  Normal, without cervical or supraclavicular adenopathy. Thyroid:  Symmetrical, normal in size, without palpable masses or nodularity. Respiratory  Effort:  Normal  Auscultation:  Clear without wheezing or rhonchi Cardiovascular  Auscultation:  Regular rate, without rubs, murmurs or gallops  Edema/varicosities:  Not grossly evident Abdominal  Soft,nontender, without masses, guarding or rebound.  Liver/spleen:  No organomegaly noted  Hernia:  None appreciated  Skin  Inspection:  Grossly normal  Palpation:  Grossly normal Neurologic/psychiatric  Orientation:  Normal with appropriate conversation.  Mood/affect:  Normal  Genitourinary    Breasts: Examined lying and sitting.     Right: Without masses, retractions, discharge or axillary adenopathy.     Left: Without  masses, retractions, discharge or axillary adenopathy.   Inguinal/mons:  Normal without inguinal adenopathy  External genitalia:  Normal  BUS/Urethra/Skene's glands:  Normal  Bladder:  Normal  Vagina:  Normal  Cervix:  absent  Uterus:  absent  Adnexa/parametria:     Rt: Without masses or tenderness.   Lt: Without masses or tenderness.  Anus and perineum: Normal  Digital rectal exam: Normal sphincter tone without palpated masses or tenderness  Assessment/Plan:  62 y.o.DWF G2 P2  for annual exam.     TAH with BSO on estradiol 0.5 Hypothyroid on Synthroid 75 mcg Depression/situational stressors Osteopenia  Plan: Repeat DEXA, will schedule. SBE's, continue annual mammogram, reviewed 3 D tomography- breasts noted to be dense. Reviewed importance of regular exercise, calcium rich diet, vitamin D 2000 daily. Home safety fall prevention discussed. Synthroid 75 mcg by mouth daily, prescription, proper use given and reviewed. CBC, glucose, lipid panel, TSH, UA. Instructed to schedule colonoscopy. Has had counseling in the past, Lexapro 10 mg by mouth daily, if no relief with scheduled counseling. Estradiol 0.5 prescription, proper use, slight risk for blood clots, strokes, breast cancer reviewed.   Harrington Challenger [name], 4:44 PM 07/17/2013

## 2013-07-18 LAB — URINALYSIS W MICROSCOPIC + REFLEX CULTURE
Bacteria, UA: NONE SEEN
Bilirubin Urine: NEGATIVE
Casts: NONE SEEN
Crystals: NONE SEEN
Glucose, UA: NEGATIVE mg/dL
Hgb urine dipstick: NEGATIVE
Ketones, ur: NEGATIVE mg/dL
Leukocytes, UA: NEGATIVE
Nitrite: NEGATIVE
Protein, ur: NEGATIVE mg/dL
Specific Gravity, Urine: 1.014 (ref 1.005–1.030)
Squamous Epithelial / LPF: NONE SEEN
Urobilinogen, UA: 0.2 mg/dL (ref 0.0–1.0)
pH: 6 (ref 5.0–8.0)

## 2013-07-18 LAB — TSH: TSH: 2.296 u[IU]/mL (ref 0.350–4.500)

## 2013-07-20 ENCOUNTER — Encounter: Payer: Self-pay | Admitting: Obstetrics and Gynecology

## 2013-08-10 DIAGNOSIS — G459 Transient cerebral ischemic attack, unspecified: Secondary | ICD-10-CM

## 2013-08-10 HISTORY — DX: Transient cerebral ischemic attack, unspecified: G45.9

## 2013-08-14 ENCOUNTER — Telehealth: Payer: Self-pay

## 2013-08-14 NOTE — Telephone Encounter (Signed)
Patient called states she is currently hospitalized post TIA.  Her physician has taken her off her Estradiol and she has been off x 3d.  That MD put her on Effexor but recommended since it will take awhile to begin working that she might check with you to see if you had any suggestions for patient in regards to something to help with hotflashes.  She said it could be rx, otc or herbal she didn't care if you could suggest anything.  The hotflashes have not started yet but she anticipates them anytime.  She also asked for copy of most recent labs to be mailed to her to take to neurologist visit and I will take care of that.

## 2013-08-14 NOTE — Telephone Encounter (Signed)
Left message in her voice mail with below.

## 2013-08-14 NOTE — Telephone Encounter (Signed)
Effexor will hopefully help prevent hot flushes. Vitamin E twice daily has had some good clinical reviews. Black cohosh.  has not shown significant reduction in hot flushes. Hope she is doing better, we are thinking of her. She has been under increased stress, does her physician feel stress caused TIA? Best not to use estrogen again.

## 2013-08-24 ENCOUNTER — Other Ambulatory Visit: Payer: Self-pay

## 2014-05-11 ENCOUNTER — Other Ambulatory Visit: Payer: Self-pay

## 2014-05-11 DIAGNOSIS — Z1231 Encounter for screening mammogram for malignant neoplasm of breast: Secondary | ICD-10-CM

## 2014-05-12 ENCOUNTER — Ambulatory Visit (INDEPENDENT_AMBULATORY_CARE_PROVIDER_SITE_OTHER): Payer: 59 | Admitting: Endocrinology

## 2014-05-12 ENCOUNTER — Encounter: Payer: Self-pay | Admitting: Endocrinology

## 2014-05-12 ENCOUNTER — Other Ambulatory Visit: Payer: Self-pay

## 2014-05-12 VITALS — BP 130/88 | HR 91 | Temp 98.6°F | Ht 64.75 in | Wt 201.0 lb

## 2014-05-12 DIAGNOSIS — E039 Hypothyroidism, unspecified: Secondary | ICD-10-CM

## 2014-05-12 DIAGNOSIS — D353 Benign neoplasm of craniopharyngeal duct: Secondary | ICD-10-CM

## 2014-05-12 DIAGNOSIS — D352 Benign neoplasm of pituitary gland: Secondary | ICD-10-CM | POA: Insufficient documentation

## 2014-05-12 DIAGNOSIS — R232 Flushing: Secondary | ICD-10-CM

## 2014-05-12 HISTORY — DX: Flushing: R23.2

## 2014-05-12 LAB — BASIC METABOLIC PANEL
BUN: 14 mg/dL (ref 6–23)
CO2: 26 mEq/L (ref 19–32)
Calcium: 9.9 mg/dL (ref 8.4–10.5)
Chloride: 104 mEq/L (ref 96–112)
Creatinine, Ser: 0.7 mg/dL (ref 0.4–1.2)
GFR: 92.93 mL/min (ref >60.00)
Glucose, Bld: 92 mg/dL (ref 70–99)
Potassium: 3.9 mEq/L (ref 3.5–5.1)
Sodium: 140 mEq/L (ref 135–145)

## 2014-05-12 NOTE — Patient Instructions (Addendum)
Please sign a release of information for the 2014 MRI in [place]. blood tests are being requested for you today.  We'll contact you with results. Let's also check a 24-HR urine for adrenaline.  Please bring this bar-coded paper to [facility] downstairs.   I would be happy to see you back here whenever you want.

## 2014-05-12 NOTE — Progress Notes (Signed)
Subjective:    Patient ID: Denise [name], female    DOB: 1950-12-24, 63 y.o.   MRN: [id number]  HPI In approx 2002, pt had resection of a pituitary adenoma, (13 X 15 X 18 MM), at [facility].  Pt says it was nonsecretory.  She did not have XRT.  At about the same time, she had a left thyroid lobectomy for a suspicious nodule.  pathol showed HURTHLE CELL ADENOMA.  She has been on synthroid since then.  The dosage has varied between 50 and 75 mcg/day.  She has not had a dedicated MRI of the pituitary in many years.  However, she had an MRI of the brain after a CVA in 2014 [place], [place]).   she has never taken non-prescribed thyroid hormone therapy.  He has never taken kelp or any other type of non-prescribed thyroid product.   He has never had XRT to the neck.  He has never been on amiodarone or lithium.   She reports moderate flushing of the upper body, and assoc myalgias.   Past Medical History  Diagnosis Date  . Hypothyroidism   . Osteopenia 2008    -1.2 FEMORAL NECK  . Migraine   . Fibroid   . Endometriosis   . HSV-1 (herpes simplex virus 1) infection   . Arthritis   . GERD (gastroesophageal reflux disease)     Past Surgical History  Procedure Laterality Date  . Abdominal hysterectomy  1989    TAH, BSO  . Shoulder surgery  2007    right  . Pituitary surgery  2001  . Thyroid lobectomy  2002  . Cholecystectomy  2003  . Knee surgery  2004    right  . Nasal septum surgery  2012    History   Social History  . Marital Status: Divorced    Spouse Name: N/A    Number of Children: 2  . Years of Education: N/A   Occupational History  .     Social History Main Topics  . Smoking status: Never Smoker   . Smokeless tobacco: Never Used  . Alcohol Use: Yes     Comment: 1 every 3 months  . Drug Use: No  . Sexual Activity: No   Other Topics Concern  . Not on file   Social History Narrative  . No narrative on file    Current Outpatient Prescriptions on File Prior to Visit    Medication Sig Dispense Refill  . levothyroxine (SYNTHROID) 75 MCG tablet Take 1 tablet (75 mcg total) by mouth daily.  30 tablet  12   No current facility-administered medications on file prior to visit.    Allergies  Allergen Reactions  . Tramadol     HALLUCINATIONS, NIGHTMARES    Family History  Problem Relation Age of Onset  . Heart disease Father   . Breast cancer Maternal Aunt   . Cancer Mother     brain tumor  . Colon cancer Neg Hx   . Colon polyps Neg Hx   . Rectal cancer Neg Hx   . Stomach cancer Neg Hx   . Thyroid disease Neg Hx     BP 130/88  Pulse 91  Temp(Src) 98.6 F (37 C) (Oral)  Ht 5' 4.75" (1.645 m)  Wt 201 lb (91.173 kg)  BMI 33.69 kg/m2  SpO2 98%  Review of Systems denies depression, sob, weight gain, arthralgias, easy bruising, and syncope.  She has intermittent memory loss.  She has mild unsteadiness on her feet.  She has bilat dull sounds in both ears.  Headaches are less frequent recently.  RUE weakness (CVA) is resolved.   She has hair loss, cold intolerance, dry skin, rhinorrhea, and constipation.     Objective:   Physical Exam VS: see vs page GEN: no distress HEAD: head: no deformity eyes: no periorbital swelling, no proptosis external nose and ears are normal mouth: no lesion seen NECK: a healed scar is present.  i do not appreciate a nodule in the thyroid or elsewhere in the neck CHEST WALL: no deformity LUNGS:  Clear to auscultation CV: reg rate and rhythm, no murmur ABD: abdomen is soft, nontender.  no hepatosplenomegaly.  not distended.  no hernia.  MUSCULOSKELETAL: muscle bulk and strength are grossly normal.  no obvious joint swelling.  gait is normal and steady EXTEMITIES: no deformity.  no ulcer on the feet.  feet are of normal color and temp.  no edema PULSES: dorsalis pedis intact bilat.  no carotid bruit NEURO:  cn 2-12 grossly intact.   readily moves all 4's.  sensation is intact to touch on the feet SKIN:  Normal texture  and temperature.  No rash or suspicious lesion is visible.   NODES:  None palpable at the neck.   PSYCH: alert, well-oriented.  Does not appear anxious nor depressed.  Lab Results  Component Value Date   TSH 0.98 05/12/2014   Cortisol=6 FSH/LH: menopausal i have reviewed the records in epic from other provider(s).       Assessment & Plan:  Pituitary adenoma: new to me.   i'll need to review records in order to address this.  Pituitary insufficiency is unlikely. Postsurgical hypothyroidism: well-replaced. flushing: new.  pheo should be r/o.   Patient Instructions  Please sign a release of information for the 2014 MRI in [place]. blood tests are being requested for you today.  We'll contact you with results. Let's also check a 24-HR urine for adrenaline.  Please bring this bar-coded [facility] to [facility] downstairs.   I would be happy to see you back here whenever you want.

## 2014-05-13 LAB — T4, FREE: Free T4: 1 ng/dL (ref 0.60–1.60)

## 2014-05-13 LAB — FOLLICLE STIMULATING HORMONE: FSH: 127.1 m[IU]/mL

## 2014-05-13 LAB — CORTISOL: Cortisol, Plasma: 6 ug/dL

## 2014-05-13 LAB — LUTEINIZING HORMONE: LH: 54.2 m[IU]/mL

## 2014-05-13 LAB — TSH: TSH: 0.98 u[IU]/mL (ref 0.35–4.50)

## 2014-05-13 LAB — PROLACTIN: Prolactin: 7 ng/mL

## 2014-05-14 LAB — PROLACTIN: Prolactin: 7 ng/mL

## 2014-05-18 ENCOUNTER — Ambulatory Visit: Payer: 59

## 2014-06-02 ENCOUNTER — Ambulatory Visit: Payer: 59

## 2014-06-02 ENCOUNTER — Ambulatory Visit: Payer: 59 | Admitting: Women's Health

## 2014-06-03 ENCOUNTER — Ambulatory Visit: Payer: 59

## 2014-06-03 ENCOUNTER — Ambulatory Visit
Admission: RE | Admit: 2014-06-03 | Discharge: 2014-06-03 | Disposition: A | Discharge status: Home / Self Care | Payer: 59 | Source: Ambulatory Visit

## 2014-06-03 DIAGNOSIS — Z1231 Encounter for screening mammogram for malignant neoplasm of breast: Secondary | ICD-10-CM

## 2014-06-04 ENCOUNTER — Telehealth: Payer: Self-pay | Admitting: *Deleted

## 2014-06-04 ENCOUNTER — Other Ambulatory Visit: Payer: Self-pay | Admitting: Women's Health

## 2014-06-04 ENCOUNTER — Ambulatory Visit (INDEPENDENT_AMBULATORY_CARE_PROVIDER_SITE_OTHER): Payer: 59 | Admitting: Women's Health

## 2014-06-04 ENCOUNTER — Encounter: Payer: Self-pay | Admitting: Women's Health

## 2014-06-04 DIAGNOSIS — N951 Menopausal and female climacteric states: Secondary | ICD-10-CM

## 2014-06-04 MED ORDER — VENLAFAXINE HCL ER 75 MG PO CP24: ORAL_CAPSULE | ORAL | Status: DC

## 2014-06-04 MED ORDER — DULOXETINE HCL 30 MG PO CPEP: ORAL_CAPSULE | ORAL | Status: DC

## 2014-06-04 NOTE — Patient Instructions (Signed)
Depression, Adult Depression refers to feeling sad, low, down in the dumps, blue, gloomy, or empty. In general, there are two kinds of depression: 1. Depression that we all experience from time to time because of upsetting life experiences, including the loss of a job or the ending of a relationship (normal sadness or normal grief). This kind of depression is considered normal, is short lived, and resolves within a few days to 2 weeks. (Depression experienced after the loss of a loved one is called bereavement. Bereavement often lasts longer than 2 weeks but normally gets better with time.) 2. Clinical depression, which lasts longer than normal sadness or normal grief or interferes with your ability to function at home, at work, and in school. It also interferes with your personal relationships. It affects almost every aspect of your life. Clinical depression is an illness. Symptoms of depression also can be caused by conditions other than normal sadness and grief or clinical depression. Examples of these conditions are listed as follows:  Physical illness--Some physical illnesses, including underactive thyroid gland (hypothyroidism), severe anemia, specific types of cancer, diabetes, uncontrolled seizures, heart and lung problems, strokes, and chronic pain are commonly associated with symptoms of depression.  Side effects of some prescription medicine--In some people, certain types of prescription medicine can cause symptoms of depression.  Substance abuse--Abuse of alcohol and illicit drugs can cause symptoms of depression. SYMPTOMS Symptoms of normal sadness and normal grief include the following:  Feeling sad or crying for short periods of time.  Not caring about anything (apathy).  Difficulty sleeping or sleeping too much.  No longer able to enjoy the things you used to enjoy.  Desire to be by oneself all the time (social isolation).  Lack of energy or motivation.  Difficulty  concentrating or remembering.  Change in appetite or weight.  Restlessness or agitation. Symptoms of clinical depression include the same symptoms of normal sadness or normal grief and also the following symptoms:  Feeling sad or crying all the time.  Feelings of guilt or worthlessness.  Feelings of hopelessness or helplessness.  Thoughts of suicide or the desire to harm yourself (suicidal ideation).  Loss of touch with reality (psychotic symptoms). Seeing or hearing things that are not real (hallucinations) or having false beliefs about your life or the people around you (delusions and paranoia). DIAGNOSIS  The diagnosis of clinical depression usually is based on the severity and duration of the symptoms. Your caregiver also will ask you questions about your medical history and substance use to find out if physical illness, use of prescription medicine, or substance abuse is causing your depression. Your caregiver also may order blood tests. TREATMENT  Typically, normal sadness and normal grief do not require treatment. However, sometimes antidepressant medicine is prescribed for bereavement to ease the depressive symptoms until they resolve. The treatment for clinical depression depends on the severity of your symptoms but typically includes antidepressant medicine, counseling with a mental health professional, or a combination of both. Your caregiver will help to determine what treatment is best for you. Depression caused by physical illness usually goes away with appropriate medical treatment of the illness. If prescription medicine is causing depression, talk with your caregiver about stopping the medicine, decreasing the dose, or substituting another medicine. Depression caused by abuse of alcohol or illicit drugs abuse goes away with abstinence from these substances. Some adults need professional help in order to stop drinking or using drugs. SEEK IMMEDIATE CARE IF:  You have thoughts  about   hurting yourself or others.  You lose touch with reality (have psychotic symptoms).  You are taking medicine for depression and have a serious side effect. FOR MORE INFORMATION National Alliance on Mental Illness: www.nami.org National Institute of Mental Health: www.nimh.nih.gov Document Released: 11/30/2000 Document Revised: 06/03/2012 Document Reviewed: 03/03/2012 ExitCare Patient Information 2015 [employer]. This information is not intended to replace advice given to you by your health care provider. Make sure you discuss any questions you have with your health care provider.  

## 2014-06-04 NOTE — Telephone Encounter (Signed)
Telephone call, will discontinue Effexor and start on Cymbalta 30 daily and increase to 60 mg in 2-3 weeks if needed. Instructed to call if no relief, schedule counseling.

## 2014-06-04 NOTE — Progress Notes (Signed)
Patient ID: Denise [name], female   DOB: Dec 18, 1950, 63 y.o.   MRN: [id number] Presents with several issues, having increased hot flushes, increased anxiety/depression, poor sleep, tearfulness. Has recently moved to [place] to be closer to her daughter, rental situation is not good, stopped ERT greater than 6 months ago after a TIA. Denies suicidal ideation. TAH with BSO 1989, hypothyroid thyroid level is normal per primary care.  Exam: Tearful most of visit.  Menopausal symptoms Anxiety/depression Situational stressors  Plan: Reviewed importance of scheduling  appointment with counselor. Options for hot flushes reviewed, will try vitamin E twice daily, Effexor 75 start with one tablet daily increase to 2 in several weeks. Prescription, proper use given and reviewed, 1 refill, instructed to followup with counselor for possible other recommendations. Reviewed may help with anxiety/depression and hot flushes. Had been on antidepressant in the past.

## 2014-06-04 NOTE — Telephone Encounter (Signed)
Pt was seen today and said after speaking with her daughter about efferxor, she remembered taking this in past. Pt unable to tolerate because it causes her to have bad dreams. Pt asked about cymbalta which she has took in past as well.  If okay with cymbalt it couldn't be the lowest dose because it never worked. Pt said if question call her cell # please advise

## 2014-06-08 ENCOUNTER — Other Ambulatory Visit: Payer: Self-pay

## 2014-06-08 MED ORDER — DULOXETINE HCL 30 MG PO CPEP: ORAL_CAPSULE | ORAL | Status: DC

## 2014-06-08 NOTE — Telephone Encounter (Signed)
Mail order pharmacy requesting Rx.

## 2014-06-09 ENCOUNTER — Encounter: Payer: Self-pay | Admitting: *Deleted

## 2014-06-09 ENCOUNTER — Telehealth: Payer: Self-pay | Admitting: Internal Medicine

## 2014-06-09 DIAGNOSIS — K921 Melena: Secondary | ICD-10-CM

## 2014-06-09 NOTE — Telephone Encounter (Signed)
Spoke with patient and she reports an episode of upper abdominal pain under breast bone, nausea and black stools yesterday. She took Gaviscon and felt better. She reports she has been having these episodes 3-4 times in the last 6 months. Denies use of Pepto bismol. States stool is only black during the episodes. She reports a hiatal hernia in past. Scheduled with Alonza Bogus, PA on 06/11/14 at 3:00 PM.

## 2014-06-09 NOTE — Telephone Encounter (Signed)
Please obtain CBC tomorrow.

## 2014-06-10 ENCOUNTER — Telehealth: Payer: Self-pay | Admitting: Endocrinology

## 2014-06-10 ENCOUNTER — Other Ambulatory Visit: Payer: Self-pay

## 2014-06-10 LAB — METANEPHRINES, URINE, 24 HOUR
Metaneph Total, Ur: 254 mcg/24 h (ref 224–832)
Metanephrines, Ur: 42 mcg/24 h — ABNORMAL LOW (ref 90–315)
Normetanephrine, 24H Ur: 212 mcg/24 h (ref 122–676)

## 2014-06-10 LAB — CATECHOLAMINES, FRACTIONATED, URINE, 24 HOUR
Calculated Total (E+NE): 34 mcg/24 h (ref 26–121)
Creatinine, Urine mg/day-CATEUR: 0.82 g/(24.h) (ref 0.63–2.50)
Dopamine, 24 hr Urine: 190 mcg/24 h (ref 52–480)
Norepinephrine, 24 hr Ur: 34 mcg/24 h (ref 15–100)
Total Volume - CF 24Hr U: 1900 mL

## 2014-06-10 MED ORDER — DULOXETINE HCL 30 MG PO CPEP: ORAL_CAPSULE | ORAL | Status: DC

## 2014-06-10 NOTE — Telephone Encounter (Signed)
Patient is requesting last lab results 

## 2014-06-10 NOTE — Telephone Encounter (Signed)
Called pt and informed that results for 24hr urine results are not back yet.

## 2014-06-10 NOTE — Telephone Encounter (Signed)
Patient given recommendation. Lab in epic.

## 2014-06-11 ENCOUNTER — Telehealth: Payer: Self-pay

## 2014-06-11 ENCOUNTER — Ambulatory Visit: Payer: 59 | Admitting: Gastroenterology

## 2014-06-11 NOTE — Telephone Encounter (Signed)
Pt called concerning lab results. Pt was advised that 24 urine was normal, but pt states her issue with being hot then cold persists. Pt states her episodes come about every 15 to 20 mins and lasts about 1 to 2 mins and would like to know where to go from here. Please advise, Thanks!

## 2014-06-13 NOTE — Telephone Encounter (Signed)
please call patient: no hormonal explanation for your symptoms was found.  Please ask your pcp.

## 2014-06-14 ENCOUNTER — Telehealth: Payer: Self-pay | Admitting: Endocrinology

## 2014-06-14 NOTE — Telephone Encounter (Signed)
please call patient: Please understand that we have checked what is within my area.  If you need to pursue other possible causes, please see a pcp.

## 2014-06-14 NOTE — Telephone Encounter (Signed)
Patient is returning [name] call    Thank You :)

## 2014-06-14 NOTE — Telephone Encounter (Signed)
Called pt back and gave lab results and Dr. Cordelia Pen instructions.

## 2014-06-14 NOTE — Telephone Encounter (Addendum)
Pt advised. She states that she does not have a PCP. Pt wanted to know if there was anything else that needed to be tested or could be tested to find out where her problem is coming from. Pt states that it could possibly be coming from her pituitary due to her having surgery. Pt states that she is concerned about this and just wants to get to the bottom of it.  Please advise, Thanks!

## 2014-06-14 NOTE — Telephone Encounter (Signed)
Requested call back to discuss.  

## 2014-06-15 ENCOUNTER — Telehealth: Payer: Self-pay | Admitting: *Deleted

## 2014-06-15 NOTE — Telephone Encounter (Signed)
Pt called to follow up regarding hot flashes said that her blood work from endocrinologist was normal. Pt asked if she could start on low dose Rx for hot flashes? Please advise

## 2014-06-15 NOTE — Telephone Encounter (Signed)
Telephone call, reviewed  history of TIA  Best to no longer use estrogen, will continue on Cymbalta which may also help with hot flushes.

## 2014-06-15 NOTE — Telephone Encounter (Signed)
Pt advised.

## 2014-06-25 ENCOUNTER — Other Ambulatory Visit: Payer: Self-pay

## 2014-06-25 DIAGNOSIS — E038 Other specified hypothyroidism: Secondary | ICD-10-CM

## 2014-06-25 MED ORDER — LEVOTHYROXINE SODIUM 75 MCG PO TABS: 75.0000 ug | ORAL_TABLET | Freq: Every day | ORAL | Status: DC

## 2014-07-23 ENCOUNTER — Encounter: Payer: 59 | Admitting: Women's Health

## 2014-08-10 ENCOUNTER — Other Ambulatory Visit: Payer: Self-pay | Admitting: Women's Health

## 2014-08-10 ENCOUNTER — Telehealth: Payer: Self-pay | Admitting: *Deleted

## 2014-08-10 MED ORDER — DULOXETINE HCL 60 MG PO CPEP: 60.0000 mg | ORAL_CAPSULE | Freq: Every day | ORAL | Status: DC

## 2014-08-10 MED ORDER — DULOXETINE HCL 30 MG PO CPEP: ORAL_CAPSULE | ORAL | Status: DC

## 2014-08-10 NOTE — Telephone Encounter (Signed)
Pt called to follow up from telephone encounter 06/04/14 still taking Cymbalta 30 daily and increase to 60 mg in 2-3 weeks if needed and no relief. Pt asked if dose could increase? Please advise

## 2014-08-10 NOTE — Telephone Encounter (Signed)
Telephone call, states took 2 of the Cymbalta 60, thinking it was  Cymbalta 30, when on the higher dose no hot flushes at all, questions if she should take a higher dose, will try Cymbalta 90, strongly encouraged counseling, in the process of moving back to [place], prescription called in for cymbalta 90

## 2014-09-09 ENCOUNTER — Other Ambulatory Visit: Payer: Self-pay | Admitting: Endocrinology

## 2014-10-15 ENCOUNTER — Telehealth: Payer: Self-pay | Admitting: *Deleted

## 2014-10-15 NOTE — Telephone Encounter (Signed)
[facility] [phone] called stating pt is requesting Rx for Amerge 2.5 mg. I did not see where pt was prescribed this by [employer]on Alas, I asked pt to call.

## 2014-10-18 ENCOUNTER — Encounter: Payer: Self-pay | Admitting: *Deleted

## 2015-08-02 ENCOUNTER — Other Ambulatory Visit: Payer: Self-pay | Admitting: Endocrinology

## 2015-08-02 ENCOUNTER — Telehealth: Payer: Self-pay | Admitting: Endocrinology

## 2015-08-02 NOTE — Telephone Encounter (Signed)
Megan,  Please see below.  Thanks

## 2015-08-02 NOTE — Telephone Encounter (Signed)
Please refill x 2 mos F/u of is due

## 2015-08-02 NOTE — Telephone Encounter (Signed)
Patient called and would like a refill on her Rx  Rx: Synthroid  Pharmacy: [place]

## 2015-08-02 NOTE — Telephone Encounter (Signed)
Error - wrong provider

## 2015-08-02 NOTE — Telephone Encounter (Signed)
Please advise if we can refill the pt's synthroid medication. Last office visit was 05/12/2014.

## 2015-08-03 MED ORDER — LEVOTHYROXINE SODIUM 75 MCG PO TABS: ORAL_TABLET | ORAL | Status: DC

## 2015-08-03 NOTE — Telephone Encounter (Signed)
Medication refilled and appointment letter mailed to the pt.

## 2015-08-08 ENCOUNTER — Ambulatory Visit: Payer: 59 | Admitting: Nurse Practitioner

## 2015-08-10 ENCOUNTER — Ambulatory Visit: Payer: 59 | Admitting: Nurse Practitioner

## 2015-08-12 ENCOUNTER — Encounter: Payer: Self-pay | Admitting: Nurse Practitioner

## 2015-08-12 ENCOUNTER — Encounter (INDEPENDENT_AMBULATORY_CARE_PROVIDER_SITE_OTHER): Payer: Self-pay

## 2015-08-12 ENCOUNTER — Ambulatory Visit (INDEPENDENT_AMBULATORY_CARE_PROVIDER_SITE_OTHER): Payer: 59 | Admitting: Nurse Practitioner

## 2015-08-12 VITALS — BP 150/120 | HR 103 | Temp 98.2°F | Resp 16 | Ht 65.0 in | Wt 200.8 lb

## 2015-08-12 DIAGNOSIS — I10 Essential (primary) hypertension: Secondary | ICD-10-CM

## 2015-08-12 DIAGNOSIS — Z7689 Persons encountering health services in other specified circumstances: Secondary | ICD-10-CM

## 2015-08-12 DIAGNOSIS — M5442 Lumbago with sciatica, left side: Secondary | ICD-10-CM

## 2015-08-12 DIAGNOSIS — D352 Benign neoplasm of pituitary gland: Secondary | ICD-10-CM

## 2015-08-12 DIAGNOSIS — E890 Postprocedural hypothyroidism: Secondary | ICD-10-CM | POA: Diagnosis not present

## 2015-08-12 DIAGNOSIS — D259 Leiomyoma of uterus, unspecified: Secondary | ICD-10-CM

## 2015-08-12 DIAGNOSIS — M8580 Other specified disorders of bone density and structure, unspecified site: Secondary | ICD-10-CM

## 2015-08-12 DIAGNOSIS — Z7189 Other specified counseling: Secondary | ICD-10-CM

## 2015-08-12 DIAGNOSIS — G43009 Migraine without aura, not intractable, without status migrainosus: Secondary | ICD-10-CM

## 2015-08-12 MED ORDER — METOPROLOL SUCCINATE ER 25 MG PO TB24: 25.0000 mg | ORAL_TABLET | Freq: Every day | ORAL | Status: DC

## 2015-08-12 MED ORDER — ATORVASTATIN CALCIUM 10 MG PO TABS: 10.0000 mg | ORAL_TABLET | Freq: Every day | ORAL | Status: DC

## 2015-08-12 MED ORDER — DULOXETINE HCL 60 MG PO CPEP: 60.0000 mg | ORAL_CAPSULE | Freq: Every day | ORAL | Status: DC

## 2015-08-12 NOTE — Patient Instructions (Addendum)
Please make an appointment to follow up with me in 2 weeks.   I will look at your BP on Monday after your visit with Dr. Loanne Drilling and if it is controlled I will call in a prednisone taper.  Welcome to [facility]!

## 2015-08-12 NOTE — Progress Notes (Signed)
Pre visit review using our clinic review tool, if applicable. No additional management support is needed unless otherwise documented below in the visit note. 

## 2015-08-12 NOTE — Progress Notes (Signed)
Patient ID: Denise [name], female    DOB: May 10, 1951  Age: 64 y.o. MRN: [id number]  CC: Establish Care   HPI Denise [name] presents for establishing care and chief complaints of medication refills and back pain.  1) New pt info:   Immunizations- unknown   Mammogram- 2015  Pap- Hysterectomy  Colonoscopy- 2004?  2) Chronic Problems-  TIA- in 2014   Migraines- since age 40, 3-4 a week (right), denies auras   3) Acute Problems-  HTN- has been on Toprol XL since 2014    Refills- 1 month to [facility]   Crying often- not taken medications in 6 weeks   Back pain- x 6 weeks saw Urgent Care, flexeril is not helpful; PT in past, stretching currently- helpful, ice/heat- helpful   Onset- 1.5 years ago  Location-  Left side low lumbar, radiating to posterior knee Duration - Constant  Characteristics- Stabbing with exertion, sitting- ache  Aggravating factors- Sitting, going up steps, bending  Relieving factors- laying on stomach  Severity- 10/10 at worst   MRI in [place]- pinched nerve per pinch   Care team-  Endocrinology- Dr. Loanne Drilling   Gynecology- Elon Alas  History Denise [name] has a past medical history of Hypothyroidism; Osteopenia (2008); Migraine; Fibroid; Endometriosis; HSV-1 (herpes simplex virus 1) infection; Arthritis; GERD (gastroesophageal reflux disease); Hyperplastic colon polyp; and Hiatal hernia.   She has past surgical history that includes Abdominal hysterectomy (1989); Shoulder surgery (2007); Pituitary surgery (2001); Thyroid lobectomy (2002); Cholecystectomy (2003); Knee surgery (2004); and Nasal septum surgery (2012).   Her family history includes Breast cancer in her maternal aunt; Cancer in her mother; Heart disease in her father; Parkinson's disease in her sister. There is no history of Colon cancer, Colon polyps, Rectal cancer, Stomach cancer, or Thyroid disease.She reports that she has never smoked. She has never used smokeless tobacco. She reports that she  drinks alcohol. She reports that she does not use illicit drugs.  Outpatient Prescriptions Prior to Visit  Medication Sig Dispense Refill  . aspirin 81 MG tablet Take 81 mg by mouth daily.    . cholecalciferol (VITAMIN D) 1000 UNITS tablet Take 1,000 Units by mouth daily.    . cyclobenzaprine (FLEXERIL) 10 MG tablet     . lansoprazole (PREVACID) 15 MG capsule Take 15 mg by mouth daily at 12 noon.    Marland Kitchen levothyroxine (SYNTHROID) 75 MCG tablet Take 1 tablet by mouth  daily 30 tablet 1  . atorvastatin (LIPITOR) 10 MG tablet Take 10 mg by mouth daily.    . DULoxetine (CYMBALTA) 60 MG capsule Take 1 capsule (60 mg total) by mouth daily. 90 capsule 3  . metoprolol succinate (TOPROL-XL) 25 MG 24 hr tablet Take 25 mg by mouth daily.    . DULoxetine (CYMBALTA) 30 MG capsule Take one tablet daily for 2 weeks increase to 2 tablets daily thereafter (Patient not taking: Reported on 08/12/2015) 90 capsule 0   No facility-administered medications prior to visit.    ROS Review of Systems  Constitutional: Negative for fever, chills, diaphoresis and fatigue.  Respiratory: Negative for chest tightness, shortness of breath and wheezing.   Cardiovascular: Negative for chest pain, palpitations and leg swelling.  Gastrointestinal: Negative for nausea, vomiting and diarrhea.  Musculoskeletal: Positive for back pain. Negative for myalgias, joint swelling, gait problem and neck pain.  Skin: Negative for rash.  Neurological: Negative for dizziness, weakness, numbness and headaches.       Denies losing bowel/bladder function, saddle anesthesia, or weakness of  extremities.   Psychiatric/Behavioral: Positive for sleep disturbance. Negative for suicidal ideas. The patient is nervous/anxious.     Objective:  BP 150/120 mmHg  Pulse 103  Temp(Src) 98.2 F (36.8 C)  Resp 16  Ht 5\' 5"  (1.651 m)  Wt 200 lb 12.8 oz (91.082 kg)  BMI 33.41 kg/m2  SpO2 97%  Physical Exam  Constitutional: She is oriented to person,  place, and time. She appears well-developed and well-nourished. No distress.  HENT:  Head: Normocephalic and atraumatic.  Right Ear: External ear normal.  Left Ear: External ear normal.  Cardiovascular: Normal rate, regular rhythm, normal heart sounds and intact distal pulses.  Exam reveals no gallop and no friction rub.   No murmur heard. Pulmonary/Chest: Effort normal and breath sounds normal. No respiratory distress. She has no wheezes. She has no rales. She exhibits no tenderness.  Neurological: She is alert and oriented to person, place, and time. No cranial nerve deficit. She exhibits normal muscle tone. Coordination normal.  Iliopsoas 5/5 Bilateral, Tib anterior 5/5 bilateral, EHL 5/5 bilateral, no ankle clonus, intact heel/toe/sequential walking, sensation intact upper and lower extremities. Straight leg raise negative bilaterally.   Skin: Skin is warm and dry. No rash noted. She is not diaphoretic.  Psychiatric: She has a normal mood and affect. Her behavior is normal. Judgment and thought content normal.  Tearful when discussing health today   Assessment & Plan:   Denise [name] was seen today for establish care.  Diagnoses and all orders for this visit:  Essential hypertension  Migraine without aura and without status migrainosus, not intractable  Postoperative hypothyroidism  Pituitary adenoma  Osteopenia  Uterine leiomyoma, unspecified location  Encounter to establish care  Left-sided low back pain with left-sided sciatica  Other orders -     atorvastatin (LIPITOR) 10 MG tablet; Take 1 tablet (10 mg total) by mouth daily. -     DULoxetine (CYMBALTA) 60 MG capsule; Take 1 capsule (60 mg total) by mouth daily. -     metoprolol succinate (TOPROL-XL) 25 MG 24 hr tablet; Take 1 tablet (25 mg total) by mouth daily.  I have discontinued Denise [name]'s ketorolac. I have also changed her atorvastatin and metoprolol succinate. Additionally, I am having her maintain her  cyclobenzaprine, aspirin, cholecalciferol, lansoprazole, levothyroxine, and DULoxetine.  Meds ordered this encounter  Medications  . DISCONTD: ketorolac (TORADOL) 10 MG tablet    Sig: Take 10 mg by mouth every 6 (six) hours as needed.  Marland Kitchen atorvastatin (LIPITOR) 10 MG tablet    Sig: Take 1 tablet (10 mg total) by mouth daily.    Dispense:  30 tablet    Refill:  0    Order Specific Question:  Supervising Provider    Answer:  Deborra Medina L [[id number]]  . DULoxetine (CYMBALTA) 60 MG capsule    Sig: Take 1 capsule (60 mg total) by mouth daily.    Dispense:  30 capsule    Refill:  0    Order Specific Question:  Supervising Provider    Answer:  Deborra Medina L [[id number]]  . metoprolol succinate (TOPROL-XL) 25 MG 24 hr tablet    Sig: Take 1 tablet (25 mg total) by mouth daily.    Dispense:  30 tablet    Refill:  0    Order Specific Question:  Supervising Provider    Answer:  Crecencio Mc [[id number]]     Follow-up: Return in about 2 weeks (around 08/26/2015) for HTN.

## 2015-08-15 ENCOUNTER — Ambulatory Visit (INDEPENDENT_AMBULATORY_CARE_PROVIDER_SITE_OTHER): Payer: 59 | Admitting: Endocrinology

## 2015-08-15 ENCOUNTER — Encounter: Payer: Self-pay | Admitting: Endocrinology

## 2015-08-15 VITALS — BP 136/87 | HR 91 | Temp 98.3°F | Ht 65.0 in | Wt 200.0 lb

## 2015-08-15 DIAGNOSIS — E890 Postprocedural hypothyroidism: Secondary | ICD-10-CM

## 2015-08-15 DIAGNOSIS — D352 Benign neoplasm of pituitary gland: Secondary | ICD-10-CM

## 2015-08-15 LAB — BASIC METABOLIC PANEL
BUN: 19 mg/dL (ref 6–23)
CO2: 28 mEq/L (ref 19–32)
Calcium: 9.7 mg/dL (ref 8.4–10.5)
Chloride: 103 mEq/L (ref 96–112)
Creatinine, Ser: 0.71 mg/dL (ref 0.40–1.20)
GFR: 88.06 mL/min (ref >60.00)
Glucose, Bld: 82 mg/dL (ref 70–99)
Potassium: 4 mEq/L (ref 3.5–5.1)
Sodium: 138 mEq/L (ref 135–145)

## 2015-08-15 LAB — T4, FREE: Free T4: 1.28 ng/dL (ref 0.60–1.60)

## 2015-08-15 LAB — TSH: TSH: 0.84 u[IU]/mL (ref 0.35–4.50)

## 2015-08-15 NOTE — Patient Instructions (Addendum)
Please sign a release of information for the 2014 MRI in [place].  blood tests are requested for you today.  We'll let you know about the results.   Please return in 1 year.

## 2015-08-15 NOTE — Progress Notes (Signed)
Subjective:    Patient ID: Denise [name], female    DOB: September 10, 1951, 64 y.o.   MRN: [id number]  HPI In returns for f/u of pituitary adenoma, (resected in 2002, at [facility] [place] X 15 X 18 MM; pt says it was nonsecretory; she did not have XRT.    She has not had a dedicated MRI of the pituitary in many years.  However, she had an MRI of the brain after a CVA in 2014 [place], [place]).  Later in 2002, she had a left thyroid lobectomy for a suspicious nodule.  pathol showed HURTHLE CELL ADENOMA.  She has been on synthroid since then.  The dosage has varied between 50 and 75 mcg/day.  Pt ran out of synthroid, but has been back on x 2 weeks.  She feels better back on it.  In particular, depression is improved.   She has moderate hair loss, throughout the head, and assoc headache.   Past Medical History  Diagnosis Date  . Hypothyroidism   . Osteopenia 2008    -1.2 FEMORAL NECK  . Migraine   . Fibroid   . Endometriosis   . HSV-1 (herpes simplex virus 1) infection   . Arthritis   . GERD (gastroesophageal reflux disease)   . Hyperplastic colon polyp   . Hiatal hernia     Past Surgical History  Procedure Laterality Date  . Abdominal hysterectomy  1989    TAH, BSO  . Shoulder surgery  2007    right  . Pituitary surgery  2001  . Thyroid lobectomy  2002  . Cholecystectomy  2003  . Knee surgery  2004    right  . Nasal septum surgery  2012    Social History   Social History  . Marital Status: Divorced    Spouse Name: N/A  . Number of Children: 2  . Years of Education: N/A   Occupational History  .     Social History Main Topics  . Smoking status: Never Smoker   . Smokeless tobacco: Never Used  . Alcohol Use: Yes     Comment: 1 every 3 months  . Drug Use: No  . Sexual Activity: No   Other Topics Concern  . Not on file   Social History Narrative    Current Outpatient Prescriptions on File Prior to Visit  Medication Sig Dispense Refill  . aspirin 81 MG tablet Take 81 mg  by mouth daily.    Marland Kitchen atorvastatin (LIPITOR) 10 MG tablet Take 1 tablet (10 mg total) by mouth daily. 30 tablet 0  . cholecalciferol (VITAMIN D) 1000 UNITS tablet Take 1,000 Units by mouth daily.    . cyclobenzaprine (FLEXERIL) 10 MG tablet     . DULoxetine (CYMBALTA) 60 MG capsule Take 1 capsule (60 mg total) by mouth daily. 30 capsule 0  . lansoprazole (PREVACID) 15 MG capsule Take 15 mg by mouth daily at 12 noon.    Marland Kitchen levothyroxine (SYNTHROID) 75 MCG tablet Take 1 tablet by mouth  daily 30 tablet 1  . metoprolol succinate (TOPROL-XL) 25 MG 24 hr tablet Take 1 tablet (25 mg total) by mouth daily. 30 tablet 0   No current facility-administered medications on file prior to visit.    Allergies  Allergen Reactions  . Tramadol     HALLUCINATIONS, NIGHTMARES    Family History  Problem Relation Age of Onset  . Heart disease Father   . Breast cancer Maternal Aunt   . Cancer Mother  brain tumor  . Colon cancer Neg Hx   . Colon polyps Neg Hx   . Rectal cancer Neg Hx   . Stomach cancer Neg Hx   . Thyroid disease Neg Hx   . Parkinson's disease Sister     BP 136/87 mmHg  Pulse 91  Temp(Src) 98.3 F (36.8 C) (Oral)  Ht 5\' 5"  (1.651 m)  Wt 200 lb (90.719 kg)  BMI 33.28 kg/m2  SpO2 94%   Review of Systems She has heat intolerance.  No weight change.      Objective:   Physical Exam VITAL SIGNS:  See vs page GENERAL: no distress Neck: a healed scar is present.  i do not appreciate a nodule in the thyroid or elsewhere in the neck.     Lab Results  Component Value Date   TSH 0.84 08/15/2015      Assessment & Plan:  Pituitary adenoma.  She has slight headache.  This should be eval separately if clinically indicated Hair loss, new, not thyroid-related. Postsurgical hypothyroidism, well-replaced.   Patient is advised the following: Patient Instructions  Please sign a release of information for the 2014 MRI in [place].  blood tests are requested for you today.  We'll let you  know about the results.   Please return in 1 year.     addendum: Please continue the same synthroid.

## 2015-08-16 ENCOUNTER — Telehealth: Payer: Self-pay | Admitting: Nurse Practitioner

## 2015-08-16 ENCOUNTER — Other Ambulatory Visit: Payer: Self-pay | Admitting: Nurse Practitioner

## 2015-08-16 NOTE — Telephone Encounter (Signed)
Error

## 2015-08-17 ENCOUNTER — Telehealth: Payer: Self-pay

## 2015-08-17 ENCOUNTER — Other Ambulatory Visit: Payer: Self-pay | Admitting: Nurse Practitioner

## 2015-08-17 MED ORDER — PREDNISONE 10 MG PO TABS: ORAL_TABLET | ORAL | Status: DC

## 2015-08-17 NOTE — Telephone Encounter (Signed)
-----   Message from Rubbie Battiest, NP sent at 08/17/2015 11:09 AM EDT ----- Please let pt know that I sent in a prednisone taper and she should take as follows:  Prednisone with breakfast  6 tablets on day 1 and decrease by 1 tablet daily until gone. Take all together not separated. No NSAIDs with this, tylenol is okay.  Thanks!

## 2015-08-17 NOTE — Telephone Encounter (Signed)
Informed pt of medication and directions. She wanted to let you know the migraine medication that she takes is Amerge. She wanted to know if you could refill for 30 tablet because her co-pay if $50 is the same if she gets 10 or 30.

## 2015-08-18 ENCOUNTER — Other Ambulatory Visit: Payer: Self-pay | Admitting: Nurse Practitioner

## 2015-08-18 MED ORDER — NARATRIPTAN HCL 2.5 MG PO TABS: 2.5000 mg | ORAL_TABLET | ORAL | Status: DC | PRN

## 2015-08-18 NOTE — Telephone Encounter (Signed)
Is she taking 1 mg or 2.5 mg? Thanks

## 2015-08-18 NOTE — Telephone Encounter (Signed)
She has been out of it for a while and wasn't positive but was pretty sure it was 2.5 because her headaches are pretty severe when she has them.

## 2015-08-25 ENCOUNTER — Encounter: Payer: Self-pay | Admitting: Nurse Practitioner

## 2015-08-25 ENCOUNTER — Telehealth: Payer: Self-pay

## 2015-08-25 ENCOUNTER — Ambulatory Visit: Payer: 59 | Admitting: Nurse Practitioner

## 2015-08-25 DIAGNOSIS — Z0000 Encounter for general adult medical examination without abnormal findings: Secondary | ICD-10-CM | POA: Insufficient documentation

## 2015-08-25 DIAGNOSIS — I10 Essential (primary) hypertension: Secondary | ICD-10-CM | POA: Insufficient documentation

## 2015-08-25 DIAGNOSIS — M549 Dorsalgia, unspecified: Secondary | ICD-10-CM | POA: Insufficient documentation

## 2015-08-25 NOTE — Assessment & Plan Note (Signed)
Patient has been having acute on chronic back pain. Has recently seen in urgent care and medication was not helpful. Due to high blood pressure today will wait until blood pressure is under control at next appointment on the 29th. Will see Dr. Cordelia Pen note and decide on prednisone taper at that time. Patient is okay with this. Conservative therapy and use the Flexeril at this time.

## 2015-08-25 NOTE — Assessment & Plan Note (Addendum)
Patient stable on levothyroxine 75 g tablets daily. Patient sees Dr. Loanne Drilling and will be obtaining labs for thyroid soon.

## 2015-08-25 NOTE — Telephone Encounter (Signed)
-----   Message from Ivonne Andrew, NT sent at 08/25/2015  9:03 AM EDT ----- Regarding: Patient Cancellation Patient cancelled appt.this morning at [phone]

## 2015-08-25 NOTE — Assessment & Plan Note (Signed)
Patient has gynecology on board.

## 2015-08-25 NOTE — Assessment & Plan Note (Signed)
Patient seen Dr. Loanne Drilling. According to note in 2015 it was resected in 2002 at [facility]

## 2015-08-25 NOTE — Assessment & Plan Note (Signed)
Patient stable on Amerge. Sent to pharmacy.

## 2015-08-25 NOTE — Assessment & Plan Note (Signed)
Patient is currently taking vitamin D.

## 2015-08-25 NOTE — Assessment & Plan Note (Signed)
Discussed acute and chronic issues. Reviewed health maintenance measures, PFSHx, and immunizations. Obtain records from previous facility.   

## 2015-08-25 NOTE — Assessment & Plan Note (Addendum)
Patient's blood pressure was same on repeat. Patient had not taken medication in 6 weeks. Refill was sent to pharmacy.

## 2015-09-09 ENCOUNTER — Other Ambulatory Visit: Payer: Self-pay | Admitting: Nurse Practitioner

## 2015-09-19 ENCOUNTER — Other Ambulatory Visit: Payer: Self-pay | Admitting: Endocrinology

## 2015-09-19 ENCOUNTER — Other Ambulatory Visit: Payer: Self-pay | Admitting: Nurse Practitioner

## 2015-10-16 ENCOUNTER — Other Ambulatory Visit: Payer: Self-pay | Admitting: Nurse Practitioner

## 2015-10-16 ENCOUNTER — Other Ambulatory Visit: Payer: Self-pay | Admitting: Endocrinology

## 2015-10-17 NOTE — Telephone Encounter (Signed)
Please advise refill, patient canceled last visit?

## 2015-10-25 ENCOUNTER — Other Ambulatory Visit: Payer: Self-pay | Admitting: Nurse Practitioner

## 2015-11-18 ENCOUNTER — Other Ambulatory Visit: Payer: Self-pay | Admitting: Endocrinology

## 2015-11-18 ENCOUNTER — Other Ambulatory Visit: Payer: Self-pay | Admitting: Nurse Practitioner

## 2015-11-22 ENCOUNTER — Other Ambulatory Visit: Payer: Self-pay | Admitting: Nurse Practitioner

## 2015-11-29 ENCOUNTER — Ambulatory Visit (INDEPENDENT_AMBULATORY_CARE_PROVIDER_SITE_OTHER): Payer: 59 | Admitting: Nurse Practitioner

## 2015-11-29 ENCOUNTER — Ambulatory Visit
Admission: RE | Admit: 2015-11-29 | Discharge: 2015-11-29 | Disposition: A | Discharge status: Home / Self Care | Payer: 59 | Source: Ambulatory Visit | Attending: Nurse Practitioner | Admitting: Nurse Practitioner

## 2015-11-29 ENCOUNTER — Encounter: Payer: Self-pay | Admitting: Nurse Practitioner

## 2015-11-29 VITALS — BP 124/70 | HR 85 | Temp 98.0°F | Wt 203.0 lb

## 2015-11-29 DIAGNOSIS — M79662 Pain in left lower leg: Secondary | ICD-10-CM

## 2015-11-29 DIAGNOSIS — R600 Localized edema: Secondary | ICD-10-CM | POA: Insufficient documentation

## 2015-11-29 LAB — D-DIMER, QUANTITATIVE (NOT AT ARMC): D-Dimer, Quant: 0.28 ug/mL-FEU (ref 0.00–0.48)

## 2015-11-29 MED ORDER — CYCLOBENZAPRINE HCL 10 MG PO TABS: 10.0000 mg | ORAL_TABLET | Freq: Every day | ORAL | Status: DC

## 2015-11-29 NOTE — Progress Notes (Signed)
Patient ID: Denise [name], female    DOB: 08/17/1951  Age: 64 y.o. MRN: [id number]  CC: Leg Pain   HPI Denise [name] presents for CC of left leg pain.   1) Sharp stabbing pain intermittent.   Onset- 4 days  Location- left  Duration - 5-6 x an hour, but lasts a second or two  Characteristics- Sharp  Aggravating factors- Flexing foot  Relieving factors- rest Severity- Intense  Denies redness, warmth, swelling  Treatment to date:  None   Has h/o sciatica and TIA  History Denise [name] has a past medical history of Hypothyroidism; Osteopenia (2008); Migraine; Fibroid; Endometriosis; HSV-1 (herpes simplex virus 1) infection; Arthritis; GERD (gastroesophageal reflux disease); Hyperplastic colon polyp; and Hiatal hernia.   She has past surgical history that includes Abdominal hysterectomy (1989); Shoulder surgery (2007); Pituitary surgery (2001); Thyroid lobectomy (2002); Cholecystectomy (2003); Knee surgery (2004); and Nasal septum surgery (2012).   Her family history includes Breast cancer in her maternal aunt; Cancer in her mother; Heart disease in her father; Parkinson's disease in her sister. There is no history of Colon cancer, Colon polyps, Rectal cancer, Stomach cancer, or Thyroid disease.She reports that she has never smoked. She has never used smokeless tobacco. She reports that she drinks alcohol. She reports that she does not use illicit drugs.  Outpatient Prescriptions Prior to Visit  Medication Sig Dispense Refill  . aspirin 81 MG tablet Take 81 mg by mouth daily.    Marland Kitchen atorvastatin (LIPITOR) 10 MG tablet TAKE 1 TABLET BY MOUTH DAILY.( NEED TO BE SEEN BY DOCTOR FOR FURTHER REFILLS) 30 tablet 0  . cholecalciferol (VITAMIN D) 1000 UNITS tablet Take 1,000 Units by mouth daily.    . DULoxetine (CYMBALTA) 60 MG capsule TAKE ONE CAPSULE BY MOUTH DAILY 90 capsule 2  . lansoprazole (PREVACID) 15 MG capsule Take 15 mg by mouth daily at 12 noon.    . metoprolol succinate  (TOPROL-XL) 25 MG 24 hr tablet TAKE 1 TABLET(25 MG) BY MOUTH DAILY 30 tablet 0  . naratriptan (AMERGE) 2.5 MG tablet Take 1 tablet (2.5 mg total) by mouth as needed for migraine. Take one (1) tablet at onset of headache; if returns or does not resolve, may repeat after 4 hours; do not exceed five (5) mg in 24 hours. 30 tablet 0  . SYNTHROID 75 MCG tablet TAKE 1 TABLET BY MOUTH DAILY 30 tablet 0  . cyclobenzaprine (FLEXERIL) 10 MG tablet     . metoprolol succinate (TOPROL-XL) 25 MG 24 hr tablet TAKE 1 TABLET(25 MG) BY MOUTH DAILY 30 tablet 0  . metoprolol succinate (TOPROL-XL) 25 MG 24 hr tablet TAKE 1 TABLET(25 MG) BY MOUTH DAILY 30 tablet 0  . predniSONE (DELTASONE) 10 MG tablet Take 6 tablets by mouth on day 1 with breakfast then decrease by 1 tablet each day until gone. 21 tablet 0   No facility-administered medications prior to visit.    ROS Review of Systems  Constitutional: Negative for fever, chills, diaphoresis and fatigue.  Respiratory: Negative for chest tightness, shortness of breath and wheezing.   Cardiovascular: Negative for chest pain, palpitations and leg swelling.  Gastrointestinal: Negative for nausea, vomiting and diarrhea.  Musculoskeletal: Positive for myalgias.  Skin: Negative for rash.  Neurological: Negative for dizziness, weakness, numbness and headaches.  Psychiatric/Behavioral: The patient is not nervous/anxious.     Objective:  BP 124/70 mmHg  Pulse 85  Temp(Src) 98 F (36.7 C) (Oral)  Wt 203 lb (92.08 kg)  SpO2 94%  Physical Exam  Constitutional: She is oriented to person, place, and time. She appears well-developed and well-nourished. No distress.  HENT:  Head: Normocephalic and atraumatic.  Right Ear: External ear normal.  Left Ear: External ear normal.  Cardiovascular: Normal rate, regular rhythm and normal heart sounds.  Exam reveals no gallop and no friction rub.   No murmur heard. Pulmonary/Chest: Effort normal and breath sounds normal. No  respiratory distress. She has no wheezes. She has no rales. She exhibits no tenderness.  Musculoskeletal: Normal range of motion. She exhibits tenderness. She exhibits no edema.  + Homans on left  Tenderness of left calf  Normal ROM of LE bilaterally  Neurological: She is alert and oriented to person, place, and time. No cranial nerve deficit. She exhibits normal muscle tone. Coordination normal.  Skin: Skin is warm and dry. No rash noted. She is not diaphoretic.  Psychiatric: She has a normal mood and affect. Her behavior is normal. Judgment and thought content normal.   Assessment & Plan:   [name] was seen today for leg pain.  Diagnoses and all orders for this visit:  Calf pain, left -     US Venous Img Lower Unilateral Left; Future -     D-Dimer, Quantitative -     Ferritin -     B12  Other orders -     cyclobenzaprine (FLEXERIL) 10 MG tablet; Take 1 tablet (10 mg total) by mouth at bedtime.   I have discontinued Ms. Axon's predniSONE. I have also changed her cyclobenzaprine. Additionally, I am having her maintain her aspirin, cholecalciferol, lansoprazole, naratriptan, metoprolol succinate, SYNTHROID, atorvastatin, and DULoxetine.  Meds ordered this encounter  Medications  . cyclobenzaprine (FLEXERIL) 10 MG tablet    Sig: Take 1 tablet (10 mg total) by mouth at bedtime.    Dispense:  30 tablet    Refill:  0    Order Specific Question:  Supervising Provider    Answer:  Crecencio Mc [[id number]]     Follow-up: Return if symptoms worsen or fail to improve.

## 2015-11-29 NOTE — Patient Instructions (Signed)
Please visit the lab.   You will hear from either myself or someone else about results.  Flexeril was refilled for some muscle relaxation.   Will follow up after results.

## 2015-11-30 ENCOUNTER — Other Ambulatory Visit: Payer: Self-pay | Admitting: Nurse Practitioner

## 2015-11-30 ENCOUNTER — Telehealth: Payer: Self-pay | Admitting: Nurse Practitioner

## 2015-11-30 DIAGNOSIS — M79662 Pain in left lower leg: Secondary | ICD-10-CM

## 2015-11-30 LAB — VITAMIN B12: Vitamin B-12: 475 pg/mL (ref 211–911)

## 2015-11-30 LAB — FERRITIN: Ferritin: 29.6 ng/mL (ref 10.0–291.0)

## 2015-11-30 NOTE — Telephone Encounter (Signed)
Spoke with pt regarding results.  

## 2015-12-06 DIAGNOSIS — M79669 Pain in unspecified lower leg: Secondary | ICD-10-CM | POA: Insufficient documentation

## 2015-12-06 NOTE — Assessment & Plan Note (Addendum)
Rule out DVT by ultrasound and d-dimer at this time. Will obtain B12 and ferritin to see if she needs any vitamin replacement for cramping of calf and numbness sensations. FU prn worsening/failure to improve. Flexeril sent to pharmacy for myalgias. Discussed risks, benefits, side effects.

## 2016-01-09 ENCOUNTER — Telehealth: Payer: Self-pay

## 2016-01-09 NOTE — Telephone Encounter (Signed)
Pt stated that she has a history of TIA's and that she was feeling unbalanced and dizzy in the middle of the night. I stated that with a Hx of TIA's she should go to the ED or Urgent Care right away for evaluation. Also setup an appointment for Lorane Gell to talk about cardiology consult.

## 2016-01-10 ENCOUNTER — Other Ambulatory Visit: Payer: Self-pay | Admitting: Nurse Practitioner

## 2016-01-10 ENCOUNTER — Other Ambulatory Visit: Payer: Self-pay | Admitting: Endocrinology

## 2016-01-11 NOTE — Telephone Encounter (Signed)
Thank you :)

## 2016-01-19 ENCOUNTER — Other Ambulatory Visit: Payer: Self-pay | Admitting: Neurology

## 2016-01-19 DIAGNOSIS — Z87898 Personal history of other specified conditions: Secondary | ICD-10-CM

## 2016-01-26 ENCOUNTER — Telehealth: Payer: Self-pay | Admitting: *Deleted

## 2016-01-26 ENCOUNTER — Other Ambulatory Visit: Payer: Self-pay | Admitting: Nurse Practitioner

## 2016-01-26 ENCOUNTER — Encounter: Payer: Self-pay | Admitting: Nurse Practitioner

## 2016-01-26 NOTE — Telephone Encounter (Signed)
I will complete this if she could bring [place] the form to sign

## 2016-01-26 NOTE — Telephone Encounter (Signed)
You last saw her on 11/29/2015.  Please advise if you will complete this.  Thanks

## 2016-01-26 NOTE — Telephone Encounter (Signed)
Patient will have jury duty on feb 20, she has requested a note stating that she can not sit through jury duty, because of he back issues. Patient stated that she can not sit or walk for long periods at a time.  Patient contact [phone]

## 2016-01-26 NOTE — Telephone Encounter (Signed)
Spoke with patient, no form is needed just a letter on letterhead with her reason.  Thanks, she would like to pick up Monday am if possible

## 2016-01-30 NOTE — Telephone Encounter (Signed)
Spoke with patient, letter up front.

## 2016-02-02 ENCOUNTER — Ambulatory Visit: Payer: 59 | Admitting: Nurse Practitioner

## 2016-02-09 ENCOUNTER — Other Ambulatory Visit: Payer: Self-pay | Admitting: Endocrinology

## 2016-02-09 ENCOUNTER — Other Ambulatory Visit: Payer: Self-pay | Admitting: Nurse Practitioner

## 2016-02-10 NOTE — Telephone Encounter (Signed)
Medication filled to pharmacy as requested.   

## 2016-02-13 ENCOUNTER — Ambulatory Visit: Payer: 59

## 2016-02-27 ENCOUNTER — Ambulatory Visit: Payer: 59 | Admitting: Physical Therapy

## 2016-02-29 ENCOUNTER — Encounter: Payer: 59 | Admitting: Physical Therapy

## 2016-03-05 ENCOUNTER — Encounter: Payer: 59 | Admitting: Physical Therapy

## 2016-03-07 ENCOUNTER — Encounter: Payer: 59 | Admitting: Physical Therapy

## 2016-03-12 ENCOUNTER — Encounter: Payer: 59 | Admitting: Physical Therapy

## 2016-03-14 ENCOUNTER — Encounter: Payer: 59 | Admitting: Physical Therapy

## 2016-04-13 ENCOUNTER — Other Ambulatory Visit: Payer: Self-pay | Admitting: Endocrinology

## 2016-04-18 ENCOUNTER — Other Ambulatory Visit: Payer: Self-pay | Admitting: Nurse Practitioner

## 2016-05-07 ENCOUNTER — Other Ambulatory Visit: Payer: Self-pay | Admitting: Endocrinology

## 2016-05-08 ENCOUNTER — Other Ambulatory Visit: Payer: Self-pay | Admitting: *Deleted

## 2016-05-08 NOTE — Telephone Encounter (Signed)
[facility] [phone] or fax [phone] Refill request atorvastatin (LIPITOR) 10 MG tablet  Left message on machine for patient.  Last office visit was 11/29/15 with Lorane Gell NP.  Last Lipid Panel was 07/17/13.  Patient should schedule an appointment to establish with a new provider for further refills.

## 2016-05-08 NOTE — Telephone Encounter (Signed)
Note faxed to pharmacy for patient to call the office and schedule an appointment with a new provider.

## 2016-05-10 ENCOUNTER — Encounter: Payer: Self-pay | Admitting: Gastroenterology

## 2016-05-11 ENCOUNTER — Other Ambulatory Visit: Payer: Self-pay

## 2016-05-11 DIAGNOSIS — Z1231 Encounter for screening mammogram for malignant neoplasm of breast: Secondary | ICD-10-CM

## 2016-05-15 ENCOUNTER — Ambulatory Visit
Admission: RE | Admit: 2016-05-15 | Discharge: 2016-05-15 | Disposition: A | Discharge status: Home / Self Care | Payer: 59 | Source: Ambulatory Visit

## 2016-05-15 ENCOUNTER — Other Ambulatory Visit: Payer: Self-pay | Admitting: Women's Health

## 2016-05-15 DIAGNOSIS — Z1231 Encounter for screening mammogram for malignant neoplasm of breast: Secondary | ICD-10-CM

## 2016-05-15 DIAGNOSIS — N644 Mastodynia: Secondary | ICD-10-CM

## 2016-05-18 ENCOUNTER — Telehealth: Payer: Self-pay | Admitting: *Deleted

## 2016-05-18 NOTE — Telephone Encounter (Signed)
Pt called c/o breast tenderness requesting order, transferred pt to appointments to schedule, last seen in 2014

## 2016-05-22 ENCOUNTER — Encounter: Payer: Self-pay | Admitting: Primary Care

## 2016-05-22 ENCOUNTER — Ambulatory Visit (INDEPENDENT_AMBULATORY_CARE_PROVIDER_SITE_OTHER): Payer: 59 | Admitting: Primary Care

## 2016-05-22 VITALS — BP 132/84 | HR 77 | Temp 97.6°F | Ht 65.0 in | Wt 199.4 lb

## 2016-05-22 DIAGNOSIS — I10 Essential (primary) hypertension: Secondary | ICD-10-CM | POA: Diagnosis not present

## 2016-05-22 DIAGNOSIS — G43009 Migraine without aura, not intractable, without status migrainosus: Secondary | ICD-10-CM | POA: Diagnosis not present

## 2016-05-22 DIAGNOSIS — E039 Hypothyroidism, unspecified: Secondary | ICD-10-CM

## 2016-05-22 DIAGNOSIS — E785 Hyperlipidemia, unspecified: Secondary | ICD-10-CM

## 2016-05-22 DIAGNOSIS — R232 Flushing: Secondary | ICD-10-CM

## 2016-05-22 DIAGNOSIS — K219 Gastro-esophageal reflux disease without esophagitis: Secondary | ICD-10-CM

## 2016-05-22 MED ORDER — ATORVASTATIN CALCIUM 10 MG PO TABS: ORAL_TABLET | ORAL | Status: DC

## 2016-05-22 MED ORDER — METOPROLOL SUCCINATE ER 25 MG PO TB24: ORAL_TABLET | ORAL | Status: DC

## 2016-05-22 MED ORDER — LEVOTHYROXINE SODIUM 75 MCG PO TABS: 75.0000 ug | ORAL_TABLET | Freq: Every day | ORAL | Status: DC

## 2016-05-22 NOTE — Progress Notes (Signed)
Pre visit review using our clinic review tool, if applicable. No additional management support is needed unless otherwise documented below in the visit note. 

## 2016-05-22 NOTE — Assessment & Plan Note (Signed)
Partial lobectomy in 2002. Currently managed on 75 mcg of levothyroxine. Will repeat TSH at upcoming physical. TSH from August 2016 stable.

## 2016-05-22 NOTE — Assessment & Plan Note (Signed)
Stable on Prevacid 15 mg. Occasionally will require 2 tablets. She is working to reduce triggers.

## 2016-05-22 NOTE — Patient Instructions (Signed)
I sent refills of your medication to the pharmacy.  Please schedule a physical with me in late August or early September at your convenience. You may also schedule a lab only appointment 3-4 days prior. We will discuss your lab results in detail during your physical.  It was a pleasure to meet you today! Please don't hesitate to call me with any questions. Welcome to [facility]!

## 2016-05-22 NOTE — Assessment & Plan Note (Addendum)
Blood pressure stable in the office today. Continue metoprolol succinate.

## 2016-05-22 NOTE — Assessment & Plan Note (Signed)
Suspect hot flashes. Will have her discuss with GYN at upcoming appointment in several weeks. Once managed on estrogen replacement therapy.

## 2016-05-22 NOTE — Assessment & Plan Note (Signed)
Stable on current regimen. Continue. ?

## 2016-05-22 NOTE — Progress Notes (Signed)
Subjective:    Patient ID: Denise [name], female    DOB: 05/07/1951, 65 y.o.   MRN: [id number]  HPI  Denise [name] is a 65 year old female who presents today to transfer care from [facility]. Her last physical was over one year ago.  1) Essential Hypertension: Diagnosed 3 years ago. Currently managed on metoprolol succinate 25 mg. History of TIA in 2014. Denies chest pain, shortness of breath. She does occasionally experience a flutter that lasts for several seconds.   2) Hyperlipidemia: Diagnosed in 2014. Currently managed on atorvastatin 10 mg. No recent Lipid panel on file.   3) Hypothyroidism: History of pituitary tumor in 2002, with removal at [facility]. Diagnosed in 2002. She's also had the left lobe of her thyroid removed. Currently managed on levothyroxine 75 mcg. Her last TSH in August 2016 was normal. Denies fatigue, hair loss, cold intolerance.  4) GERD: Currently managed on Prevacid 15 mg daily. She will sometimes take 2 tablets daily. She's been working to reduce her consumption of sodas and other triggers. Without her medication she will experience epigastric discomfort and nausea.  5) Migraines and Frequent Headaches: Present since early to mid 30's. She is currently managed on naratriptan 2.5 mg as needed. She is also managed on Cymbalta 60 mg daily for chronic, frequent headaches. She has noticed an improvement in headaches overall. Her migraines occur once to twice monthly.  6) Hot Flashes: Hysterectomy at age 33. Once managed on estrogen replacement. Was taken off in 2014 after TIA. She's continued to experience hot flashes since removal of estrogen therapy. She is to follow-up with her GYN next week and will discuss symptoms.  Review of Systems  Constitutional: Negative for unexpected weight change.  Respiratory: Negative for shortness of breath.   Cardiovascular: Negative for chest pain.  Gastrointestinal:       GERD.  Endocrine: Negative for cold intolerance.    Genitourinary:       Hysterectomy. Hot flashes.  Neurological:       Frequent headaches stable       Past Medical History  Diagnosis Date  . Hypothyroidism   . Osteopenia 2008    -1.2 FEMORAL NECK  . Migraine   . Fibroid   . Endometriosis   . HSV-1 (herpes simplex virus 1) infection   . Arthritis   . GERD (gastroesophageal reflux disease)   . Hyperplastic colon polyp   . Hiatal hernia   . Low back pain   . Bursitis of hip     Left     Social History   Social History  . Marital Status: Divorced    Spouse Name: N/A  . Number of Children: 2  . Years of Education: N/A   Occupational History  .     Social History Main Topics  . Smoking status: Never Smoker   . Smokeless tobacco: Never Used  . Alcohol Use: Yes     Comment: 1 every 3 months  . Drug Use: No  . Sexual Activity: No   Other Topics Concern  . Not on file   Social History Narrative   Single.   2 daughters, 4 grandchildren.   Once worked for [employer].   Enjoys Ambulance person.     Past Surgical History  Procedure Laterality Date  . Abdominal hysterectomy  1989    TAH, BSO  . Shoulder surgery  2007    right  . Pituitary surgery  2001  . Thyroid lobectomy  2002  . Cholecystectomy  2003  . Knee surgery  2004    right  . Nasal septum surgery  2012    Family History  Problem Relation Age of Onset  . Heart disease Father   . Breast cancer Maternal Aunt   . Cancer Mother     brain tumor  . Colon cancer Neg Hx   . Colon polyps Neg Hx   . Rectal cancer Neg Hx   . Stomach cancer Neg Hx   . Thyroid disease Neg Hx   . Parkinson's disease Sister     Allergies  Allergen Reactions  . Tramadol     HALLUCINATIONS, NIGHTMARES    Current Outpatient Prescriptions on File Prior to Visit  Medication Sig Dispense Refill  . aspirin 81 MG tablet Take 81 mg by mouth daily.    . cyclobenzaprine (FLEXERIL) 10 MG tablet Take 1 tablet (10 mg total) by mouth at bedtime. 30  tablet 0  . DULoxetine (CYMBALTA) 60 MG capsule TAKE ONE CAPSULE BY MOUTH DAILY 90 capsule 2  . lansoprazole (PREVACID) 15 MG capsule Take 15 mg by mouth daily at 12 noon.    . naratriptan (AMERGE) 2.5 MG tablet TAKE 1 TABLET BY MOUTH AS NEEDED FOR MIGRAINE. MAY REPEAT IN 4 HOURS IF SYMPTOMS PERSIST. MAX 2 TABLETS IN 24 HOURS 30 tablet 0  . cholecalciferol (VITAMIN D) 1000 UNITS tablet Take 1,000 Units by mouth daily. Reported on 05/22/2016     No current facility-administered medications on file prior to visit.    BP 132/84 mmHg  Pulse 77  Temp(Src) 97.6 F (36.4 C) (Oral)  Ht 5\' 5"  (1.651 m)  Wt 199 lb 6.4 oz (90.447 kg)  BMI 33.18 kg/m2  SpO2 96%    Objective:   Physical Exam  Constitutional: She appears well-nourished.  Neck: Neck supple.  Cardiovascular: Normal rate and regular rhythm.   Pulmonary/Chest: Effort normal and breath sounds normal.  Skin: Skin is warm and dry.  Psychiatric: She has a normal mood and affect.          Assessment & Plan:

## 2016-05-23 ENCOUNTER — Ambulatory Visit: Payer: Self-pay | Admitting: Women's Health

## 2016-05-28 ENCOUNTER — Ambulatory Visit (AMBULATORY_SURGERY_CENTER): Payer: Self-pay | Admitting: *Deleted

## 2016-05-28 VITALS — Ht 65.0 in | Wt 197.8 lb

## 2016-05-28 DIAGNOSIS — Z1211 Encounter for screening for malignant neoplasm of colon: Secondary | ICD-10-CM

## 2016-05-28 MED ORDER — NA SULFATE-K SULFATE-MG SULF 17.5-3.13-1.6 GM/177ML PO SOLN: 1.0000 | Freq: Once | ORAL | Status: DC

## 2016-05-28 NOTE — Progress Notes (Signed)
No egg or soy allergy known to patient  No issues with past sedation with any surgeries  or procedures, no intubation problems - with pituitary surgery hard to wake  No diet pills per patient No home 02 use per patient  No blood thinners per patient  Pt states issues with constipation - she states 90 % of the time she is constipated- she uses stool softeners and she still has hard stools, occ has diarrhea with diet changes but mostly deals with the constipation - pt states she has a dull achy pain in the rectal area , no bleeding, no hemorrhoids that she is aware of - due to constipation issues, did a 2 day prep

## 2016-06-06 ENCOUNTER — Ambulatory Visit: Payer: Self-pay | Admitting: Women's Health

## 2016-06-06 ENCOUNTER — Encounter: Payer: Self-pay | Admitting: Gastroenterology

## 2016-06-09 ENCOUNTER — Other Ambulatory Visit: Payer: Self-pay | Admitting: Endocrinology

## 2016-06-15 ENCOUNTER — Ambulatory Visit (AMBULATORY_SURGERY_CENTER): Payer: 59 | Admitting: Gastroenterology

## 2016-06-15 ENCOUNTER — Encounter: Payer: Self-pay | Admitting: Gastroenterology

## 2016-06-15 VITALS — BP 126/71 | HR 67 | Temp 98.9°F | Resp 15 | Ht 65.0 in | Wt 197.0 lb

## 2016-06-15 DIAGNOSIS — Z1210 Encounter for screening for malignant neoplasm of intestinal tract, unspecified: Secondary | ICD-10-CM

## 2016-06-15 DIAGNOSIS — K621 Rectal polyp: Secondary | ICD-10-CM | POA: Diagnosis not present

## 2016-06-15 DIAGNOSIS — D128 Benign neoplasm of rectum: Secondary | ICD-10-CM

## 2016-06-15 DIAGNOSIS — D129 Benign neoplasm of anus and anal canal: Secondary | ICD-10-CM

## 2016-06-15 DIAGNOSIS — D120 Benign neoplasm of cecum: Secondary | ICD-10-CM

## 2016-06-15 DIAGNOSIS — D122 Benign neoplasm of ascending colon: Secondary | ICD-10-CM | POA: Diagnosis not present

## 2016-06-15 MED ORDER — SODIUM CHLORIDE 0.9 % IV SOLN: 500.0000 mL | INTRAVENOUS | Status: DC

## 2016-06-15 NOTE — Patient Instructions (Signed)
YOU HAD AN ENDOSCOPIC PROCEDURE TODAY AT [facility]:   Refer to the procedure report that was given to you for any specific questions about what was found during the examination.  If the procedure report does not answer your questions, please call your gastroenterologist to clarify.  If you requested that your care partner not be given the details of your procedure findings, then the procedure report has been included in a sealed envelope for you to review at your convenience later.  YOU SHOULD EXPECT: Some feelings of bloating in the abdomen. Passage of more gas than usual.  Walking can help get rid of the air that was put into your GI tract during the procedure and reduce the bloating. If you had a lower endoscopy (such as a colonoscopy or flexible sigmoidoscopy) you may notice spotting of blood in your stool or on the toilet paper. If you underwent a bowel prep for your procedure, you may not have a normal bowel movement for a few days.  Please Note:  You might notice some irritation and congestion in your nose or some drainage.  This is from the oxygen used during your procedure.  There is no need for concern and it should clear up in a day or so.  SYMPTOMS TO REPORT IMMEDIATELY:   Following lower endoscopy (colonoscopy or flexible sigmoidoscopy):  Excessive amounts of blood in the stool  Significant tenderness or worsening of abdominal pains  Swelling of the abdomen that is new, acute  Fever of 100F or higher    For urgent or emergent issues, a gastroenterologist can be reached at any hour by calling [phone].   DIET: Your first meal following the procedure should be a small meal and then it is ok to progress to your normal diet. Heavy or fried foods are harder to digest and may make you feel nauseous or bloated.  Likewise, meals heavy in dairy and vegetables can increase bloating.  Drink plenty of fluids but you should avoid alcoholic beverages for 24  hours.  ACTIVITY:  You should plan to take it easy for the rest of today and you should NOT DRIVE or use heavy machinery until tomorrow (because of the sedation medicines used during the test).    FOLLOW UP: Our staff will call the number listed on your records the next business day following your procedure to check on you and address any questions or concerns that you may have regarding the information given to you following your procedure. If we do not reach you, we will leave a message.  However, if you are feeling well and you are not experiencing any problems, there is no need to return our call.  We will assume that you have returned to your regular daily activities without incident.  If any biopsies were taken you will be contacted by phone or by letter within the next 1-3 weeks.  Please call us at [phone] if you have not heard about the biopsies in 3 weeks.    SIGNATURES/CONFIDENTIALITY: You and/or your care partner have signed paperwork which will be entered into your electronic medical record.  These signatures attest to the fact that that the information above on your After Visit Summary has been reviewed and is understood.  Full responsibility of the confidentiality of this discharge information lies with you and/or your care-partner.   Information on polyps ,diverticulosis,hemorrhoids ,and high fiber diet given to you today  May use Miralax (over the counter ) for constipation  or a fiber supplement   Information on banding of hemorrhoids given to you today as Dr. Havery Moros talked to you about

## 2016-06-15 NOTE — Op Note (Signed)
[facility] Patient Name: Denise [name] Procedure Date: 06/15/2016 2:24 PM MRN: [id number] Endoscopist: Remo Lipps P. Havery Moros , MD Age: 65 Referring MD:  Date of Birth: August 21, 1951 Gender: Female Account #: [id number] Procedure:                Colonoscopy Indications:              Screening for malignant neoplasm in the colon Medicines:                Monitored Anesthesia Care Procedure:                Pre-Anesthesia Assessment:                           - Prior to the procedure, a History and Physical                            was performed, and patient medications and                            allergies were reviewed. The patient's tolerance of                            previous anesthesia was also reviewed. The risks                            and benefits of the procedure and the sedation                            options and risks were discussed with the patient.                            All questions were answered, and informed consent                            was obtained. Prior Anticoagulants: The patient has                            taken aspirin, last dose was 1 day prior to                            procedure. ASA Grade Assessment: II - A patient                            with mild systemic disease. After reviewing the                            risks and benefits, the patient was deemed in                            satisfactory condition to undergo the procedure.                           After obtaining informed consent, the colonoscope  was passed under direct vision. Throughout the                            procedure, the patient's blood pressure, pulse, and                            oxygen saturations were monitored continuously. The                            Model PCF-H190L [id number] scope was introduced                            through the anus and advanced to the the cecum,   identified by appendiceal orifice and ileocecal                            valve. The colonoscopy was performed without                            difficulty. The patient tolerated the procedure                            well. The quality of the bowel preparation was                            adequate. The ileocecal valve, appendiceal orifice,                            and rectum were photographed. Scope In: 2:33:36 PM Scope Out: 2:58:00 PM Scope Withdrawal Time: 0 hours 20 minutes 56 seconds  Total Procedure Duration: 0 hours 24 minutes 24 seconds  Findings:                 The perianal and digital rectal examinations were                            normal.                           A 3 mm polyp was found in the cecum. The polyp was                            sessile. The polyp was removed with a cold biopsy                            forceps. Resection and retrieval were complete.                           Two sessile polyps were found in the ascending                            colon. The polyps were 4 to 5 mm in size. These  polyps were removed with a cold snare. Resection                            and retrieval were complete.                           A 4 mm polyp was found in the rectum. The polyp was                            sessile. The polyp was removed with a cold snare.                            Resection and retrieval were complete.                           A few small-mouthed diverticula were found in the                            sigmoid colon.                           Non-bleeding internal hemorrhoids were found during                            retroflexion.                           The exam was otherwise without abnormality. Complications:            No immediate complications. Estimated blood loss:                            Minimal. Estimated Blood Loss:     Estimated blood loss was minimal. Impression:               - One 3 mm  polyp in the cecum, removed with a cold                            biopsy forceps. Resected and retrieved.                           - Two 4 to 5 mm polyps in the ascending colon,                            removed with a cold snare. Resected and retrieved.                           - One 4 mm polyp in the rectum, removed with a cold                            snare. Resected and retrieved.                           - Diverticulosis in the sigmoid colon.                           -  Non-bleeding internal hemorrhoids.                           - The examination was otherwise normal. Recommendation:           - Patient has a contact number available for                            emergencies. The signs and symptoms of potential                            delayed complications were discussed with the                            patient. Return to normal activities tomorrow.                            Written discharge instructions were provided to the                            patient.                           - Resume previous diet.                           - Continue present medications.                           - Await pathology results.                           - Repeat colonoscopy is recommended for                            surveillance. The colonoscopy date will be                            determined after pathology results from today's                            exam become available for review. Remo Lipps P. Malayja Freund, MD 06/15/2016 3:02:09 PM This report has been signed electronically.

## 2016-06-15 NOTE — Progress Notes (Signed)
Called to room to assist during endoscopic procedure.  Patient ID and intended procedure confirmed with present staff. Received instructions for my participation in the procedure from the performing physician.  

## 2016-06-15 NOTE — Progress Notes (Signed)
A and O x3. Report to RN. Tolerated MAC anesthesia well. 

## 2016-06-18 ENCOUNTER — Telehealth: Payer: Self-pay | Admitting: *Deleted

## 2016-06-18 NOTE — Telephone Encounter (Signed)
  Follow up Call-  Call back number 06/15/2016  Post procedure Call Back phone  # [phone]  Permission to leave phone message Yes     Patient questions:  Do you have a fever, pain , or abdominal swelling? No. Pain Score  0 *  Have you tolerated food without any problems? Yes.    Have you been able to return to your normal activities? Yes.    Do you have any questions about your discharge instructions: Diet   No. Medications  No. Follow up visit  No.  Do you have questions or concerns about your Care? No.  Actions: * If pain score is 4 or above: No action needed, pain <4.

## 2016-06-21 ENCOUNTER — Encounter: Payer: Self-pay | Admitting: Gastroenterology

## 2016-09-13 DIAGNOSIS — M19049 Primary osteoarthritis, unspecified hand: Secondary | ICD-10-CM | POA: Diagnosis not present

## 2016-09-13 DIAGNOSIS — M79642 Pain in left hand: Secondary | ICD-10-CM | POA: Diagnosis not present

## 2016-09-13 DIAGNOSIS — M65352 Trigger finger, left little finger: Secondary | ICD-10-CM | POA: Diagnosis not present

## 2016-09-13 DIAGNOSIS — M65342 Trigger finger, left ring finger: Secondary | ICD-10-CM | POA: Diagnosis not present

## 2016-09-13 DIAGNOSIS — M79641 Pain in right hand: Secondary | ICD-10-CM | POA: Diagnosis not present

## 2016-09-13 DIAGNOSIS — M6530 Trigger finger, unspecified finger: Secondary | ICD-10-CM | POA: Diagnosis not present

## 2016-10-19 ENCOUNTER — Other Ambulatory Visit: Payer: Self-pay | Admitting: Nurse Practitioner

## 2016-11-13 ENCOUNTER — Other Ambulatory Visit: Payer: Self-pay | Admitting: *Deleted

## 2016-11-13 DIAGNOSIS — G43901 Migraine, unspecified, not intractable, with status migrainosus: Secondary | ICD-10-CM

## 2016-11-13 DIAGNOSIS — R51 Headache: Principal | ICD-10-CM

## 2016-11-13 DIAGNOSIS — R519 Headache, unspecified: Secondary | ICD-10-CM

## 2016-11-13 MED ORDER — DULOXETINE HCL 60 MG PO CPEP: 60.0000 mg | ORAL_CAPSULE | Freq: Every day | ORAL | 1 refills | Status: DC

## 2016-11-13 MED ORDER — NARATRIPTAN HCL 2.5 MG PO TABS: ORAL_TABLET | ORAL | 0 refills | Status: DC

## 2016-11-13 NOTE — Telephone Encounter (Signed)
Patient left a voicemail stating that she has moved temporally to [place], but plans on keeping Denise [name] as her PCP. Patient stated that she is almost out of two of her medications Duloxetine and Naratriptan and did not need them filled last time she saw Denise [name].  Patient stated that these will be new scripts fot Denise [name] to prescribe, but she has been on them for a while and Denise [name] is aware. Patient stated that she usually gets 30 of the [place] at a time. Patient needs the scripts sent to [facility]/telephone number [phone]

## 2016-11-20 DIAGNOSIS — J0100 Acute maxillary sinusitis, unspecified: Secondary | ICD-10-CM | POA: Diagnosis not present

## 2017-01-17 DIAGNOSIS — Z23 Encounter for immunization: Secondary | ICD-10-CM | POA: Diagnosis not present

## 2017-02-07 ENCOUNTER — Encounter: Payer: 59 | Admitting: Women's Health

## 2017-02-25 ENCOUNTER — Ambulatory Visit (INDEPENDENT_AMBULATORY_CARE_PROVIDER_SITE_OTHER): Payer: Medicare Other | Admitting: Primary Care

## 2017-02-25 ENCOUNTER — Other Ambulatory Visit: Payer: Self-pay | Admitting: Primary Care

## 2017-02-25 ENCOUNTER — Encounter: Payer: Self-pay | Admitting: Primary Care

## 2017-02-25 VITALS — BP 134/80 | HR 73 | Temp 97.5°F | Ht 65.0 in | Wt 209.1 lb

## 2017-02-25 DIAGNOSIS — R002 Palpitations: Secondary | ICD-10-CM

## 2017-02-25 DIAGNOSIS — R079 Chest pain, unspecified: Secondary | ICD-10-CM

## 2017-02-25 DIAGNOSIS — N644 Mastodynia: Secondary | ICD-10-CM

## 2017-02-25 DIAGNOSIS — Z23 Encounter for immunization: Secondary | ICD-10-CM | POA: Diagnosis not present

## 2017-02-25 DIAGNOSIS — E785 Hyperlipidemia, unspecified: Secondary | ICD-10-CM | POA: Diagnosis not present

## 2017-02-25 DIAGNOSIS — Z0000 Encounter for general adult medical examination without abnormal findings: Secondary | ICD-10-CM | POA: Diagnosis not present

## 2017-02-25 DIAGNOSIS — R7309 Other abnormal glucose: Secondary | ICD-10-CM | POA: Diagnosis not present

## 2017-02-25 DIAGNOSIS — E2839 Other primary ovarian failure: Secondary | ICD-10-CM | POA: Diagnosis not present

## 2017-02-25 DIAGNOSIS — I10 Essential (primary) hypertension: Secondary | ICD-10-CM | POA: Diagnosis not present

## 2017-02-25 HISTORY — DX: Chest pain, unspecified: R07.9

## 2017-02-25 LAB — TSH: TSH: 3.14 mIU/L

## 2017-02-25 LAB — COMPREHENSIVE METABOLIC PANEL
ALT: 11 U/L (ref 6–29)
AST: 16 U/L (ref 10–35)
Albumin: 4 g/dL (ref 3.6–5.1)
Alkaline Phosphatase: 192 U/L — ABNORMAL HIGH (ref 33–130)
BUN: 14 mg/dL (ref 7–25)
CO2: 29 mmol/L (ref 20–31)
Calcium: 9.3 mg/dL (ref 8.6–10.4)
Chloride: 104 mmol/L (ref 98–110)
Creat: 0.77 mg/dL (ref 0.50–0.99)
Glucose, Bld: 107 mg/dL — ABNORMAL HIGH (ref 65–99)
Potassium: 4.2 mmol/L (ref 3.5–5.3)
Sodium: 141 mmol/L (ref 135–146)
Total Bilirubin: 0.5 mg/dL (ref 0.2–1.2)
Total Protein: 7 g/dL (ref 6.1–8.1)

## 2017-02-25 LAB — CBC
HCT: 42.3 % (ref 35.0–45.0)
Hemoglobin: 13.8 g/dL (ref 11.7–15.5)
MCH: 27.1 pg (ref 27.0–33.0)
MCHC: 32.6 g/dL (ref 32.0–36.0)
MCV: 83.1 fL (ref 80.0–100.0)
MPV: 10.1 fL (ref 7.5–12.5)
Platelets: 303 10*3/uL (ref 140–400)
RBC: 5.09 MIL/uL (ref 3.80–5.10)
RDW: 13.8 % (ref 11.0–15.0)
WBC: 8.6 10*3/uL (ref 3.8–10.8)

## 2017-02-25 LAB — LIPID PANEL
Cholesterol: 135 mg/dL (ref <200)
HDL: 53 mg/dL (ref >50)
LDL Cholesterol: 61 mg/dL (ref <100)
Total CHOL/HDL Ratio: 2.5 Ratio (ref <5.0)
Triglycerides: 104 mg/dL (ref <150)
VLDL: 21 mg/dL (ref <30)

## 2017-02-25 NOTE — Patient Instructions (Signed)
Complete lab work prior to leaving today. I will notify you of your results once received.   Stop by the front desk and speak with either Denise [name] or Denise [name] regarding your referral to cardiology.  Call the [facility] in [place] to schedule your mammogram and bone density testing.  You were provided with a pneumonia vaccination today, we will repeat this in 1 year.  Continue your efforts towards a healthy diet by reducing consumption of fast food, fried food, processed snack foods, sugary drinks. Increase consumption of fresh vegetables and fruits, whole grains, water.  Ensure you are drinking 64 ounces of water daily.  Start exercising. You should be getting 150 minutes of moderate intensity exercise weekly.  Take a look at the advanced directives packet.  Follow up in 1 year for your annual exam or sooner if needed.  It was a pleasure to see you today!

## 2017-02-25 NOTE — Assessment & Plan Note (Signed)
Stable today. Continue Toprol XL.

## 2017-02-25 NOTE — Assessment & Plan Note (Signed)
Prevnar due today, Td UTD. Mammogram and Bone Density testing due and ordered. Colonoscopy UTD. Discussed the importance of a healthy diet and regular exercise in order for weight loss, and to reduce the risk of other medical diseases. Referral placed to cardiology for further evaluation of chest pain given symptoms and abnormal ECG. She will schedule eye and dental exams. Labs pending.  I have personally reviewed and have noted: 1. The patient's medical and social history 2. Their use of alcohol, tobacco or illicit drugs 3. Their current medications and supplements 4. The patient's functional ability including ADL's, fall  risks, home safety risks and hearing or visual  impairment. 5. Diet and physical activities 6. Evidence for depression or mood disorder  All recommendations provided at end of visit.

## 2017-02-25 NOTE — Progress Notes (Signed)
Pre visit review using our clinic review tool, if applicable. No additional management support is needed unless otherwise documented below in the visit note. 

## 2017-02-25 NOTE — Assessment & Plan Note (Signed)
Due for repeat bone density scan, ordered and pending. Continue vitamin D.

## 2017-02-25 NOTE — Progress Notes (Signed)
Patient ID: Denise [name], female   DOB: 18-Aug-1951, 66 y.o.   MRN: [id number]  Denise [name] is a 66 year old female who presents today for her Welcome to Medicare Visit and a chief complaint of chest pain.  HPI:  Past Medical History:  Diagnosis Date  . Allergy    seasonal  . Arthritis   . Bursitis of hip    Left  . Constipation    90%of the time c/o constipation, occ has diarrhea - uses stool softener   . Endometriosis   . Fibroid   . GERD (gastroesophageal reflux disease)   . Hiatal hernia   . HSV-1 (herpes simplex virus 1) infection   . Hyperlipidemia   . Hyperplastic colon polyp   . Hypertension   . Hypothyroidism   . Low back pain   . Migraine   . Osteopenia 2008   -1.2 FEMORAL NECK  . TIA (transient ischemic attack) 08-10-2013    Current Outpatient Prescriptions  Medication Sig Dispense Refill  . aspirin 81 MG tablet Take 81 mg by mouth daily.    Marland Kitchen atorvastatin (LIPITOR) 10 MG tablet TAKE 1 TABLET BY MOUTH DAILY. 90 tablet 3  . cetirizine (ZYRTEC) 10 MG tablet Take 10 mg by mouth as needed for allergies. Reported on 05/28/2016    . cholecalciferol (VITAMIN D) 1000 UNITS tablet Take 1,000 Units by mouth daily. Reported on 05/28/2016    . cyclobenzaprine (FLEXERIL) 10 MG tablet Take 1 tablet (10 mg total) by mouth at bedtime. 30 tablet 0  . DULoxetine (CYMBALTA) 60 MG capsule Take 1 capsule (60 mg total) by mouth daily. 90 capsule 1  . fluticasone (FLONASE) 50 MCG/ACT nasal spray Place 1 spray into both nostrils daily. Reported on 05/28/2016    . lansoprazole (PREVACID) 15 MG capsule Take 15 mg by mouth daily at 12 noon.    . metoprolol succinate (TOPROL-XL) 25 MG 24 hr tablet TAKE 1 TABLET(25 MG) BY MOUTH DAILY 90 tablet 3  . naratriptan (AMERGE) 2.5 MG tablet TAKE 1 TABLET BY MOUTH AS NEEDED FOR MIGRAINE. MAY REPEAT IN 4 HOURS IF SYMPTOMS PERSIST. MAX 2 TABLETS IN 24 HOURS 30 tablet 0  . SYNTHROID 75 MCG tablet TAKE 1 TABLET BY MOUTH DAILY 30 tablet 6   No current  facility-administered medications for this visit.     Allergies  Allergen Reactions  . Tramadol     HALLUCINATIONS, NIGHTMARES    Family History  Problem Relation Age of Onset  . Heart disease Father   . Breast cancer Maternal Aunt   . Cancer Mother     brain tumor  . Colon cancer Neg Hx   . Colon polyps Neg Hx   . Rectal cancer Neg Hx   . Stomach cancer Neg Hx   . Thyroid disease Neg Hx   . Esophageal cancer Neg Hx   . Parkinson's disease Sister     Social History   Social History  . Marital status: Divorced    Spouse name: N/A  . Number of children: 2  . Years of education: N/A   Occupational History  .  Retired   Social History Main Topics  . Smoking status: Never Smoker  . Smokeless tobacco: Never Used  . Alcohol use 0.0 oz/week     Comment: 1 every 3 months  . Drug use: No  . Sexual activity: No   Other Topics Concern  . Not on file   Social History Narrative   Single.  2 daughters, 4 grandchildren.   Once worked for [employer].   Enjoys Ambulance person.     Hospitiliaztions: None  Health Maintenance:    Flu: Completed in February 2018  Tetanus: Unsure, believes it's been within 10 years.   Pneumovax: Due in 2019  Prevnar: Never completed, due.  Zostavax: Never completed, has had shingles.  Bone Density: Completed in 2011, due.   Colonoscopy: Completed in 2017, due in 2022  Eye Doctor: Due, plans on scheduling.  Dental Exam: Has not completed in 1 year, plans on scheduling.  Mammogram: Completed in 2015, due.   Pap: Hysterectomy   Hep C Screening: Due    Providers: Alma Friendly, PCP; Hand Surgery; Dr. Melrose Nakayama, Neurology   I have personally reviewed and have noted: 1. The patient's medical and social history 2. Their use of alcohol, tobacco or illicit drugs 3. Their current medications and supplements 4. The patient's functional ability including ADL's, fall risks, home safety risks  and hearing or visual  impairment. 5. Diet and physical activities 6. Evidence for depression or mood disorder  Subjective:   Review of Systems:   Constitutional: Denies fever, malaise, fatigue, headache or abrupt weight changes.  HEENT: Denies eye pain, eye redness, ear pain, ringing in the ears, wax buildup, runny nose, nasal congestion, bloody nose, or sore throat. Respiratory: Denies difficulty breathing, shortness of breath, cough or sputum production.   Cardiovascular: Experiences intermittent left upper chest pain/tenderness that lasts 1-2 days, this has been going on for years. She describes her pain as sharp. The last episode occurred 4 days ago that lasted 2 days. The pain radiated down the left upper extremity and up the left side of her neck. This will occur once monthly on average that will occur at rest and exertion. She will occasionally feel nauseated with her chest pain, denies diaphoresis. She has history of TIA. She does have GERD that she thinks has become worse, but this pain is different. She has no symptoms today. Gastrointestinal: Denies abdominal pain, bloating, constipation, diarrhea or blood in the stool. She does have GERD that doesn't feel well controlled on Prevacid.  GU: Denies urgency, frequency, pain with urination, burning sensation, blood in urine, odor or discharge. Musculoskeletal: Denies decrease in range of motion, difficulty with gait, muscle pain or joint pain and swelling.  Skin: Denies redness, rashes, lesions or ulcercations.  Neurological: Denies dizziness, difficulty with memory, difficulty with speech or problems with balance and coordination.  Psychiatric: Denies concerns for anxiety or depression.   No other specific complaints in a complete review of systems (except as listed in HPI above).  Objective:  PE:   There were no vitals taken for this visit. Wt Readings from Last 3 Encounters:  06/15/16 197 lb (89.4 kg)  05/28/16 197 lb 12.8 oz (89.7 kg)  05/22/16 199  lb 6.4 oz (90.4 kg)    General: Appears their stated age, well developed, well nourished in NAD. Skin: Warm, dry and intact. No rashes, lesions or ulcerations noted. HEENT: Head: normal shape and size; Eyes: sclera white, no icterus, conjunctiva pink, PERRLA and EOMs intact; Ears: Tm's gray and intact, normal light reflex; Nose: mucosa pink and moist, septum midline; Throat/Mouth: Teeth present, mucosa pink and moist, no exudate, lesions or ulcerations noted.  Neck: Normal range of motion. Neck supple, trachea midline.  Cardiovascular: Normal rate and rhythm. S1,S2 noted.  No murmur, rubs or gallops noted. No JVD or BLE edema. No carotid bruits noted. Pulmonary/Chest: Normal  effort and positive vesicular breath sounds. No respiratory distress. No wheezes, rales or ronchi noted.  Abdomen: Soft and nontender. Normal bowel sounds., Musculoskeletal: Normal range of motion. No signs of joint swelling. No difficulty with gait.  Neurological: Alert and oriented. Cranial nerves II-XII intact. Coordination normal.  Psychiatric: Mood and affect normal. Behavior is normal. Judgment and thought content normal.   EKG: NSR, LBBB. No obvious ST abnormality.  BMET    Component Value Date/Time   NA 138 08/15/2015 1410   K 4.0 08/15/2015 1410   CL 103 08/15/2015 1410   CO2 28 08/15/2015 1410   GLUCOSE 82 08/15/2015 1410   BUN 19 08/15/2015 1410   CREATININE 0.71 08/15/2015 1410   CALCIUM 9.7 08/15/2015 1410    Lipid Panel     Component Value Date/Time   CHOL 176 07/17/2013 1644   TRIG 183 (H) 07/17/2013 1644   HDL 60 07/17/2013 1644   CHOLHDL 2.9 07/17/2013 1644   VLDL 37 07/17/2013 1644   LDLCALC 79 07/17/2013 1644    CBC    Component Value Date/Time   WBC 8.5 07/17/2013 1644   RBC 5.08 07/17/2013 1644   HGB 14.5 07/17/2013 1644   HCT 42.1 07/17/2013 1644   PLT 320 07/17/2013 1644   MCV 82.9 07/17/2013 1644   MCH 28.5 07/17/2013 1644   MCHC 34.4 07/17/2013 1644   RDW 13.6 07/17/2013  1644   LYMPHSABS 2.7 07/17/2013 1644   MONOABS 0.4 07/17/2013 1644   EOSABS 0.3 07/17/2013 1644   BASOSABS 0.0 07/17/2013 1644    Hgb A1C No results found for: HGBA1C    Assessment and Plan:   Medicare Annual Wellness Visit:  Diet: She endorses a healthy diet Breakfast: Oatmeal, fruit Lunch: Skips, fruit Dinner: Some fast food, meat, vegetables, starch Snacks: Occasionally, fruit Desserts: Chocolate daily Beverages: Coffee Physical activity: She does not regularly exercise. Depression/mood screen: Negative Hearing: Intact to whispered voice Visual acuity: Grossly normal, performs annual eye exam  ADLs: Capable Fall risk: None Home safety: Good Cognitive evaluation: Intact to orientation, naming, recall and repetition EOL planning: Adv directives not completed, packet provided today. She is a full code/ I agree  Preventative Medicine:  Prevnar due today, Td UTD. Mammogram and Bone Density testing due and ordered. Colonoscopy UTD. Discussed the importance of a healthy diet and regular exercise in order for weight loss, and to reduce the risk of other medical diseases. Referral placed to cardiology for further evaluation of chest pain given symptoms and abnormal ECG. She will schedule eye and dental exams. Labs pending.   Next appointment: To be seen by cardiology. Follow up in 1 year if work up unremarkable.

## 2017-02-25 NOTE — Assessment & Plan Note (Signed)
Uncontrolled on Prevacid. Will have her try omeprazole 20 mg, advance to 1 tablet BID if needed. She will update if no improvement.

## 2017-02-25 NOTE — Assessment & Plan Note (Signed)
TSH pending. Continue Synthroid 75 mcg for now.

## 2017-02-25 NOTE — Assessment & Plan Note (Signed)
Does well on Cymbalta and Amerge. Recent increase in price of Cymbalta, may need to switch, she will update if this is the case.

## 2017-02-25 NOTE — Assessment & Plan Note (Signed)
Intermittent for years, recent episode with radiation of pain as mentioned in HPI. ECG today with LBBB, regular rate, no obvious ST abnormality. Check labs which are pending. Given symptoms, history of TIA and HTN, and ECG without comparison, will send to cardiology for further work up. She is stable for outpatient treatment at this time as she has been asymptomatic for 2 days.

## 2017-02-26 ENCOUNTER — Other Ambulatory Visit: Payer: Self-pay | Admitting: Primary Care

## 2017-02-26 DIAGNOSIS — N644 Mastodynia: Secondary | ICD-10-CM

## 2017-02-27 ENCOUNTER — Telehealth: Payer: Self-pay | Admitting: *Deleted

## 2017-02-27 LAB — HEMOGLOBIN A1C
Hgb A1c MFr Bld: 5.5 % (ref <5.7)
Mean Plasma Glucose: 111 mg/dL

## 2017-02-27 MED ORDER — SYNTHROID 75 MCG PO TABS: 75.0000 ug | ORAL_TABLET | Freq: Every day | ORAL | 3 refills | Status: DC

## 2017-02-27 NOTE — Telephone Encounter (Signed)
Patient called stating that she is changing pharmacies and wants hard copies of her scripts. Advised patient that she can have the scripts transferred from her existing pharmacy to her new pharmacy with a phone call. Patient stated that she does need a written script for Synthroid and wants to get brand name because that is what she has always used. Patient stated that her phone number has changed and wants to update her number and thought she gave the new number at check in the other day. New phone number is [phone]. Patient stated that she has not heard anything about setting up her mammogram yet.

## 2017-02-27 NOTE — Telephone Encounter (Signed)
Rx printed and ready for pick up. Placed in [place]. She should be hearing back soon for her mammogram, if not then she is welcome to call the [facility] to schedule.

## 2017-02-28 NOTE — Telephone Encounter (Signed)
Spoken and notified patient of Kate's comments. Patient verbalized understanding. Left in the front office.

## 2017-02-28 NOTE — Telephone Encounter (Signed)
Patient returned [name]'s call.  Please call patient back at [phone].

## 2017-03-01 ENCOUNTER — Encounter: Payer: Self-pay | Admitting: Primary Care

## 2017-03-05 ENCOUNTER — Encounter: Payer: 59 | Admitting: Primary Care

## 2017-03-11 DIAGNOSIS — I447 Left bundle-branch block, unspecified: Secondary | ICD-10-CM | POA: Diagnosis not present

## 2017-03-11 DIAGNOSIS — E7800 Pure hypercholesterolemia, unspecified: Secondary | ICD-10-CM | POA: Diagnosis not present

## 2017-03-11 DIAGNOSIS — G459 Transient cerebral ischemic attack, unspecified: Secondary | ICD-10-CM | POA: Diagnosis not present

## 2017-03-11 DIAGNOSIS — R079 Chest pain, unspecified: Secondary | ICD-10-CM | POA: Diagnosis not present

## 2017-03-11 DIAGNOSIS — I10 Essential (primary) hypertension: Secondary | ICD-10-CM | POA: Diagnosis not present

## 2017-03-18 ENCOUNTER — Other Ambulatory Visit: Payer: 59

## 2017-03-19 DIAGNOSIS — I447 Left bundle-branch block, unspecified: Secondary | ICD-10-CM | POA: Diagnosis not present

## 2017-03-19 DIAGNOSIS — R079 Chest pain, unspecified: Secondary | ICD-10-CM | POA: Diagnosis not present

## 2017-03-20 DIAGNOSIS — R079 Chest pain, unspecified: Secondary | ICD-10-CM | POA: Diagnosis not present

## 2017-03-20 DIAGNOSIS — I081 Rheumatic disorders of both mitral and tricuspid valves: Secondary | ICD-10-CM | POA: Diagnosis not present

## 2017-03-27 ENCOUNTER — Other Ambulatory Visit: Payer: Self-pay

## 2017-03-27 DIAGNOSIS — E039 Hypothyroidism, unspecified: Secondary | ICD-10-CM

## 2017-03-27 NOTE — Telephone Encounter (Signed)
[facility] mail order pharmacy left order requesting response to faxed request for synthroid 75 mcg. On 02/27/17 refilled # 90 x 3 [facility] [place]. [facility] is not on profile for pharmacies. Unable to reach pt by phone.

## 2017-03-28 MED ORDER — LEVOTHYROXINE SODIUM 75 MCG PO TABS: 75.0000 ug | ORAL_TABLET | Freq: Every day | ORAL | 3 refills | Status: DC

## 2017-03-28 NOTE — Telephone Encounter (Signed)
Noted. Rx printed and placed in [name]'s inbox. 

## 2017-03-28 NOTE — Telephone Encounter (Signed)
I spoke with pt and she said the name brand Synthroid is too expensive and wants to try the generic brand. Pt said she would like a 90 day printed rx mailed to her home (address verified) and pt will probably take to local [facility] in [place] after cking pricing. Pt request cb when completed.

## 2017-03-29 NOTE — Telephone Encounter (Addendum)
Message left for patient to return my call.  

## 2017-04-03 NOTE — Telephone Encounter (Addendum)
Message left for patient to return my call.  

## 2017-04-04 NOTE — Telephone Encounter (Signed)
Spoken to patient on 04/03/2017. Notified patient that the Rx has been mailed out as she requested. Patient verbalized understanding.

## 2017-05-01 ENCOUNTER — Encounter: Payer: Self-pay | Admitting: Gynecology

## 2017-06-10 ENCOUNTER — Other Ambulatory Visit: Payer: Self-pay | Admitting: Primary Care

## 2017-06-10 DIAGNOSIS — M25512 Pain in left shoulder: Secondary | ICD-10-CM | POA: Diagnosis not present

## 2017-06-10 DIAGNOSIS — R519 Headache, unspecified: Secondary | ICD-10-CM

## 2017-06-10 DIAGNOSIS — G43901 Migraine, unspecified, not intractable, with status migrainosus: Secondary | ICD-10-CM

## 2017-06-10 DIAGNOSIS — R51 Headache: Principal | ICD-10-CM

## 2017-06-21 DIAGNOSIS — M19042 Primary osteoarthritis, left hand: Secondary | ICD-10-CM | POA: Diagnosis not present

## 2017-06-21 DIAGNOSIS — M720 Palmar fascial fibromatosis [Dupuytren]: Secondary | ICD-10-CM | POA: Diagnosis not present

## 2017-06-21 DIAGNOSIS — M79642 Pain in left hand: Secondary | ICD-10-CM | POA: Diagnosis not present

## 2017-07-10 DIAGNOSIS — I447 Left bundle-branch block, unspecified: Secondary | ICD-10-CM | POA: Diagnosis not present

## 2017-07-10 DIAGNOSIS — I10 Essential (primary) hypertension: Secondary | ICD-10-CM | POA: Diagnosis not present

## 2017-07-10 DIAGNOSIS — E7800 Pure hypercholesterolemia, unspecified: Secondary | ICD-10-CM | POA: Diagnosis not present

## 2017-07-10 DIAGNOSIS — G459 Transient cerebral ischemic attack, unspecified: Secondary | ICD-10-CM | POA: Diagnosis not present

## 2017-07-10 DIAGNOSIS — R079 Chest pain, unspecified: Secondary | ICD-10-CM | POA: Diagnosis not present

## 2017-08-07 ENCOUNTER — Other Ambulatory Visit: Payer: Self-pay | Admitting: Primary Care

## 2017-08-07 DIAGNOSIS — E785 Hyperlipidemia, unspecified: Secondary | ICD-10-CM

## 2017-08-14 DIAGNOSIS — M722 Plantar fascial fibromatosis: Secondary | ICD-10-CM | POA: Diagnosis not present

## 2017-08-14 DIAGNOSIS — M79672 Pain in left foot: Secondary | ICD-10-CM | POA: Diagnosis not present

## 2017-08-21 ENCOUNTER — Other Ambulatory Visit: Payer: Self-pay | Admitting: Primary Care

## 2017-08-21 DIAGNOSIS — R519 Headache, unspecified: Secondary | ICD-10-CM

## 2017-08-21 DIAGNOSIS — R51 Headache: Principal | ICD-10-CM

## 2017-08-21 DIAGNOSIS — I10 Essential (primary) hypertension: Secondary | ICD-10-CM

## 2017-08-21 DIAGNOSIS — G43901 Migraine, unspecified, not intractable, with status migrainosus: Secondary | ICD-10-CM

## 2017-08-22 NOTE — Telephone Encounter (Signed)
Noted, refill sent to pharmacy. 

## 2017-08-22 NOTE — Telephone Encounter (Signed)
Ok to refill? Electronically refill request for   Duloxetine (CYMBALTA) 60 MG capsule - Last prescribed on 06/11/2017  metoprolol succinate (TOPROL-XL) 25 MG 24 hr tablet - Last prescribed on 05/22/2016  Last seen on 02/25/2017

## 2017-08-28 DIAGNOSIS — M79672 Pain in left foot: Secondary | ICD-10-CM | POA: Diagnosis not present

## 2017-08-28 DIAGNOSIS — M7662 Achilles tendinitis, left leg: Secondary | ICD-10-CM | POA: Diagnosis not present

## 2017-08-28 DIAGNOSIS — M722 Plantar fascial fibromatosis: Secondary | ICD-10-CM | POA: Diagnosis not present

## 2017-09-09 DIAGNOSIS — M79672 Pain in left foot: Secondary | ICD-10-CM | POA: Diagnosis not present

## 2017-09-11 DIAGNOSIS — M722 Plantar fascial fibromatosis: Secondary | ICD-10-CM | POA: Diagnosis not present

## 2017-09-16 DIAGNOSIS — M79672 Pain in left foot: Secondary | ICD-10-CM | POA: Diagnosis not present

## 2017-09-23 DIAGNOSIS — M722 Plantar fascial fibromatosis: Secondary | ICD-10-CM | POA: Diagnosis not present

## 2017-09-30 DIAGNOSIS — M722 Plantar fascial fibromatosis: Secondary | ICD-10-CM | POA: Diagnosis not present

## 2017-10-04 DIAGNOSIS — H2510 Age-related nuclear cataract, unspecified eye: Secondary | ICD-10-CM | POA: Diagnosis not present

## 2017-10-04 DIAGNOSIS — H524 Presbyopia: Secondary | ICD-10-CM | POA: Diagnosis not present

## 2017-10-07 DIAGNOSIS — M722 Plantar fascial fibromatosis: Secondary | ICD-10-CM | POA: Diagnosis not present

## 2017-10-11 DIAGNOSIS — M79672 Pain in left foot: Secondary | ICD-10-CM | POA: Diagnosis not present

## 2017-10-18 DIAGNOSIS — M722 Plantar fascial fibromatosis: Secondary | ICD-10-CM | POA: Diagnosis not present

## 2017-10-23 DIAGNOSIS — M722 Plantar fascial fibromatosis: Secondary | ICD-10-CM | POA: Diagnosis not present

## 2017-10-28 DIAGNOSIS — M722 Plantar fascial fibromatosis: Secondary | ICD-10-CM | POA: Diagnosis not present

## 2017-11-26 ENCOUNTER — Other Ambulatory Visit: Payer: Self-pay | Admitting: Primary Care

## 2017-11-26 DIAGNOSIS — E785 Hyperlipidemia, unspecified: Secondary | ICD-10-CM

## 2018-04-09 ENCOUNTER — Other Ambulatory Visit: Payer: Self-pay | Admitting: Primary Care

## 2018-04-09 DIAGNOSIS — E039 Hypothyroidism, unspecified: Secondary | ICD-10-CM

## 2018-04-09 DIAGNOSIS — E785 Hyperlipidemia, unspecified: Secondary | ICD-10-CM

## 2018-04-09 DIAGNOSIS — I10 Essential (primary) hypertension: Secondary | ICD-10-CM

## 2019-01-17 DIAGNOSIS — K559 Vascular disorder of intestine, unspecified: Secondary | ICD-10-CM

## 2019-01-17 HISTORY — DX: Vascular disorder of intestine, unspecified: K55.9

## 2019-01-26 ENCOUNTER — Emergency Department: Payer: Medicare Other

## 2019-01-26 ENCOUNTER — Inpatient Hospital Stay
Admission: EM | Admit: 2019-01-26 | Discharge: 2019-01-28 | DRG: 394 | Disposition: A | Discharge status: Home / Self Care | Payer: Medicare Other | Attending: Internal Medicine | Admitting: Internal Medicine

## 2019-01-26 ENCOUNTER — Other Ambulatory Visit: Payer: Self-pay

## 2019-01-26 DIAGNOSIS — I10 Essential (primary) hypertension: Secondary | ICD-10-CM | POA: Diagnosis present

## 2019-01-26 DIAGNOSIS — K529 Noninfective gastroenteritis and colitis, unspecified: Secondary | ICD-10-CM

## 2019-01-26 DIAGNOSIS — Z885 Allergy status to narcotic agent status: Secondary | ICD-10-CM | POA: Diagnosis not present

## 2019-01-26 DIAGNOSIS — Z8249 Family history of ischemic heart disease and other diseases of the circulatory system: Secondary | ICD-10-CM

## 2019-01-26 DIAGNOSIS — K219 Gastro-esophageal reflux disease without esophagitis: Secondary | ICD-10-CM | POA: Diagnosis present

## 2019-01-26 DIAGNOSIS — Z8673 Personal history of transient ischemic attack (TIA), and cerebral infarction without residual deficits: Secondary | ICD-10-CM

## 2019-01-26 DIAGNOSIS — Z820 Family history of epilepsy and other diseases of the nervous system: Secondary | ICD-10-CM | POA: Diagnosis not present

## 2019-01-26 DIAGNOSIS — R1084 Generalized abdominal pain: Secondary | ICD-10-CM

## 2019-01-26 DIAGNOSIS — E039 Hypothyroidism, unspecified: Secondary | ICD-10-CM | POA: Diagnosis present

## 2019-01-26 DIAGNOSIS — Z803 Family history of malignant neoplasm of breast: Secondary | ICD-10-CM | POA: Diagnosis not present

## 2019-01-26 DIAGNOSIS — E785 Hyperlipidemia, unspecified: Secondary | ICD-10-CM | POA: Diagnosis present

## 2019-01-26 DIAGNOSIS — Z7951 Long term (current) use of inhaled steroids: Secondary | ICD-10-CM

## 2019-01-26 DIAGNOSIS — Z7982 Long term (current) use of aspirin: Secondary | ICD-10-CM | POA: Diagnosis not present

## 2019-01-26 DIAGNOSIS — Z79899 Other long term (current) drug therapy: Secondary | ICD-10-CM

## 2019-01-26 DIAGNOSIS — Z79890 Hormone replacement therapy (postmenopausal): Secondary | ICD-10-CM | POA: Diagnosis not present

## 2019-01-26 DIAGNOSIS — R110 Nausea: Secondary | ICD-10-CM

## 2019-01-26 DIAGNOSIS — K449 Diaphragmatic hernia without obstruction or gangrene: Secondary | ICD-10-CM | POA: Diagnosis present

## 2019-01-26 DIAGNOSIS — K559 Vascular disorder of intestine, unspecified: Principal | ICD-10-CM | POA: Diagnosis present

## 2019-01-26 DIAGNOSIS — R7989 Other specified abnormal findings of blood chemistry: Secondary | ICD-10-CM

## 2019-01-26 DIAGNOSIS — K921 Melena: Secondary | ICD-10-CM | POA: Diagnosis present

## 2019-01-26 DIAGNOSIS — K922 Gastrointestinal hemorrhage, unspecified: Secondary | ICD-10-CM

## 2019-01-26 HISTORY — DX: Noninfective gastroenteritis and colitis, unspecified: K52.9

## 2019-01-26 LAB — COMPREHENSIVE METABOLIC PANEL
ALT: 16 U/L (ref 0–44)
AST: 23 U/L (ref 15–41)
Albumin: 4.1 g/dL (ref 3.5–5.0)
Alkaline Phosphatase: 172 U/L — ABNORMAL HIGH (ref 38–126)
Anion gap: 9 (ref 5–15)
BUN: 24 mg/dL — ABNORMAL HIGH (ref 8–23)
CO2: 22 mmol/L (ref 22–32)
Calcium: 9.1 mg/dL (ref 8.9–10.3)
Chloride: 107 mmol/L (ref 98–111)
Creatinine, Ser: 0.85 mg/dL (ref 0.44–1.00)
GFR calc Af Amer: >60 mL/min (ref >60)
GFR calc non Af Amer: >60 mL/min (ref >60)
Glucose, Bld: 184 mg/dL — ABNORMAL HIGH (ref 70–99)
Potassium: 3.9 mmol/L (ref 3.5–5.1)
Sodium: 138 mmol/L (ref 135–145)
Total Bilirubin: 0.4 mg/dL (ref 0.3–1.2)
Total Protein: 7.5 g/dL (ref 6.5–8.1)

## 2019-01-26 LAB — TYPE AND SCREEN
ABO/RH(D): O NEG
Antibody Screen: NEGATIVE

## 2019-01-26 LAB — GASTROINTESTINAL PANEL BY PCR, STOOL (REPLACES STOOL CULTURE)

## 2019-01-26 LAB — CBC
HCT: 44.4 % (ref 36.0–46.0)
Hemoglobin: 14.4 g/dL (ref 12.0–15.0)
MCH: 27 pg (ref 26.0–34.0)
MCHC: 32.4 g/dL (ref 30.0–36.0)
MCV: 83.3 fL (ref 80.0–100.0)
Platelets: 296 10*3/uL (ref 150–400)
RBC: 5.33 MIL/uL — ABNORMAL HIGH (ref 3.87–5.11)
RDW: 13 % (ref 11.5–15.5)
WBC: 16.5 10*3/uL — ABNORMAL HIGH (ref 4.0–10.5)
nRBC: 0 % (ref 0.0–0.2)

## 2019-01-26 LAB — URINALYSIS, ROUTINE W REFLEX MICROSCOPIC
Bilirubin Urine: NEGATIVE
Glucose, UA: NEGATIVE mg/dL
Hgb urine dipstick: NEGATIVE
Ketones, ur: NEGATIVE mg/dL
Leukocytes, UA: NEGATIVE
Nitrite: NEGATIVE
Protein, ur: NEGATIVE mg/dL
Specific Gravity, Urine: >1.046 — ABNORMAL HIGH (ref 1.005–1.030)
pH: 5 (ref 5.0–8.0)

## 2019-01-26 LAB — LACTIC ACID, PLASMA
Lactic Acid, Venous: 2.3 mmol/L (ref 0.5–1.9)
Lactic Acid, Venous: 1.6 mmol/L (ref 0.5–1.9)

## 2019-01-26 LAB — C DIFFICILE QUICK SCREEN W PCR REFLEX
C Diff antigen: NEGATIVE
C Diff interpretation: NOT DETECTED
C Diff toxin: NEGATIVE

## 2019-01-26 MED ORDER — ONDANSETRON HCL 4 MG PO TABS: 4.0000 mg | ORAL_TABLET | Freq: Four times a day (QID) | ORAL | Status: DC | PRN

## 2019-01-26 MED ORDER — METOPROLOL SUCCINATE ER 25 MG PO TB24
12.5000 mg | ORAL_TABLET | Freq: Every day | ORAL | Status: DC
  Administered 2019-01-26 – 2019-01-28 (×3): 12.5 mg via ORAL
  Filled 2019-01-26 (×4): qty 1

## 2019-01-26 MED ORDER — MORPHINE SULFATE (PF) 4 MG/ML IV SOLN
4.0000 mg | Freq: Once | INTRAVENOUS | Status: AC
  Administered 2019-01-26: 4 mg via INTRAVENOUS
  Filled 2019-01-26: qty 1

## 2019-01-26 MED ORDER — METRONIDAZOLE IN NACL 5-0.79 MG/ML-% IV SOLN
500.0000 mg | Freq: Three times a day (TID) | INTRAVENOUS | Status: DC
  Administered 2019-01-26 – 2019-01-28 (×7): 500 mg via INTRAVENOUS
  Filled 2019-01-26 (×9): qty 100

## 2019-01-26 MED ORDER — PNEUMOCOCCAL VAC POLYVALENT 25 MCG/0.5ML IJ INJ: 0.5000 mL | INJECTION | INTRAMUSCULAR | Status: DC

## 2019-01-26 MED ORDER — INFLUENZA VAC SPLIT HIGH-DOSE 0.5 ML IM SUSY
0.5000 mL | PREFILLED_SYRINGE | INTRAMUSCULAR | Status: DC
  Filled 2019-01-26: qty 0.5

## 2019-01-26 MED ORDER — LISINOPRIL 10 MG PO TABS
5.0000 mg | ORAL_TABLET | Freq: Every day | ORAL | Status: DC
  Administered 2019-01-26 – 2019-01-28 (×3): 5 mg via ORAL
  Filled 2019-01-26 (×4): qty 1

## 2019-01-26 MED ORDER — LEVOFLOXACIN IN D5W 500 MG/100ML IV SOLN
500.0000 mg | INTRAVENOUS | Status: DC
  Administered 2019-01-26 – 2019-01-28 (×3): 500 mg via INTRAVENOUS
  Filled 2019-01-26 (×3): qty 100

## 2019-01-26 MED ORDER — ONDANSETRON HCL 4 MG/2ML IJ SOLN: 4.0000 mg | Freq: Four times a day (QID) | INTRAMUSCULAR | Status: DC | PRN

## 2019-01-26 MED ORDER — ONDANSETRON HCL 4 MG/2ML IJ SOLN
4.0000 mg | INTRAMUSCULAR | Status: AC
  Administered 2019-01-26: 4 mg via INTRAVENOUS
  Filled 2019-01-26: qty 2

## 2019-01-26 MED ORDER — ACETAMINOPHEN 325 MG PO TABS
650.0000 mg | ORAL_TABLET | Freq: Four times a day (QID) | ORAL | Status: DC | PRN
  Administered 2019-01-26: 650 mg via ORAL
  Filled 2019-01-26: qty 2

## 2019-01-26 MED ORDER — MORPHINE SULFATE (PF) 2 MG/ML IV SOLN
2.0000 mg | INTRAVENOUS | Status: DC | PRN
  Administered 2019-01-26 – 2019-01-27 (×3): 2 mg via INTRAVENOUS
  Filled 2019-01-26 (×3): qty 1

## 2019-01-26 MED ORDER — PANTOPRAZOLE SODIUM 40 MG IV SOLR
40.0000 mg | Freq: Two times a day (BID) | INTRAVENOUS | Status: DC
  Administered 2019-01-26 – 2019-01-28 (×5): 40 mg via INTRAVENOUS
  Filled 2019-01-26 (×5): qty 40

## 2019-01-26 MED ORDER — SODIUM CHLORIDE 0.9 % IV SOLN
INTRAVENOUS | Status: DC
  Administered 2019-01-26 – 2019-01-28 (×6): via INTRAVENOUS

## 2019-01-26 MED ORDER — IOPAMIDOL (ISOVUE-300) INJECTION 61%
100.0000 mL | Freq: Once | INTRAVENOUS | Status: AC | PRN
  Administered 2019-01-26: 100 mL via INTRAVENOUS

## 2019-01-26 MED ORDER — ACETAMINOPHEN 650 MG RE SUPP: 650.0000 mg | Freq: Four times a day (QID) | RECTAL | Status: DC | PRN

## 2019-01-26 MED ORDER — LEVOTHYROXINE SODIUM 50 MCG PO TABS
75.0000 ug | ORAL_TABLET | Freq: Every day | ORAL | Status: DC
  Administered 2019-01-27 – 2019-01-28 (×2): 75 ug via ORAL
  Filled 2019-01-26 (×2): qty 2

## 2019-01-26 MED ORDER — OXYCODONE-ACETAMINOPHEN 5-325 MG PO TABS
1.0000 | ORAL_TABLET | Freq: Four times a day (QID) | ORAL | Status: DC | PRN
  Administered 2019-01-27 – 2019-01-28 (×5): 1 via ORAL
  Filled 2019-01-26 (×5): qty 1

## 2019-01-26 NOTE — H&P (Signed)
[place] [facility] [place] NAME: Denise [name]    MR#:  [id number]  DATE OF BIRTH:  July 27, 1951  DATE OF ADMISSION:  01/26/2019  PRIMARY CARE PHYSICIAN: Pleas Koch, NP   REQUESTING/REFERRING PHYSICIAN:   CHIEF COMPLAINT:   Chief Complaint  Patient presents with  . Rectal Bleeding  . Abdominal Pain    HISTORY OF PRESENT ILLNESS: Denise [name]  is a 68 y.o. female with a known history of endometriosis, hiatal hernia, herpes simplex virus infection, hyperlipidemia, hypertension, hypothyroidism, TIA in the past presented to the emergency room for abdominal pain.  Patient has generalized abdominal pain aching in nature 6 out of 10 on a scale of 1-10.  She also had couple of episodes of bloody stools.  She was evaluated with CT abdomen which showed colitis.  Hospitalist service was consulted for further care.  No complaints of any vomiting of blood.  PAST MEDICAL HISTORY:   Past Medical History:  Diagnosis Date  . Allergy    seasonal  . Arthritis   . Bursitis of hip    Left  . Constipation    90%of the time c/o constipation, occ has diarrhea - uses stool softener   . Endometriosis   . Fibroid   . GERD (gastroesophageal reflux disease)   . Hiatal hernia   . HSV-1 (herpes simplex virus 1) infection   . Hyperlipidemia   . Hyperplastic colon polyp   . Hypertension   . Hypothyroidism   . Low back pain   . Migraine   . Osteopenia 2008   -1.2 FEMORAL NECK  . TIA (transient ischemic attack) 08-10-2013    PAST SURGICAL HISTORY:  Past Surgical History:  Procedure Laterality Date  . ABDOMINAL HYSTERECTOMY  1989   [facility], [place]  2003  . COLONOSCOPY    . KNEE SURGERY  2004   right  . NASAL SEPTUM SURGERY  2012  . PITUITARY SURGERY  2001  . POLYPECTOMY    . SHOULDER SURGERY  2007   right  . THYROID LOBECTOMY  2002    SOCIAL HISTORY:  Social History   Tobacco Use  . Smoking status: Never Smoker  .  Smokeless tobacco: Never Used  Substance Use Topics  . Alcohol use: Yes    Alcohol/week: 0.0 standard drinks    Comment: 1 every 3 months    FAMILY HISTORY:  Family History  Problem Relation Age of Onset  . Heart disease Father   . Cancer Mother        brain tumor  . Breast cancer Maternal Aunt   . Parkinson's disease Sister   . Colon cancer Neg Hx   . Colon polyps Neg Hx   . Rectal cancer Neg Hx   . Stomach cancer Neg Hx   . Thyroid disease Neg Hx   . Esophageal cancer Neg Hx     DRUG ALLERGIES:  Allergies  Allergen Reactions  . Tramadol Other (See Comments)    Reaction: NIGHTMARES    REVIEW OF SYSTEMS:   CONSTITUTIONAL: No fever, fatigue or weakness.  EYES: No blurred or double vision.  EARS, NOSE, AND THROAT: No tinnitus or ear pain.  RESPIRATORY: No cough, shortness of breath, wheezing or hemoptysis.  CARDIOVASCULAR: No chest pain, orthopnea, edema.  GASTROINTESTINAL: No nausea, vomiting Has abdominal pain.  Has bloody stool GENITOURINARY: No dysuria, hematuria.  ENDOCRINE: No polyuria, nocturia,  HEMATOLOGY: No anemia, easy bruising or bleeding SKIN: No rash or  lesion. MUSCULOSKELETAL: No joint pain or arthritis.   NEUROLOGIC: No tingling, numbness, weakness.  PSYCHIATRY: No anxiety or depression.   MEDICATIONS AT HOME:  Prior to Admission medications   Medication Sig Start Date End Date Taking? Authorizing Provider  aspirin 81 MG tablet Take 81 mg by mouth daily.   Yes [provider]  lansoprazole (PREVACID) 15 MG capsule Take 15 mg by mouth 2 (two) times daily before a meal.    Yes [provider]  atorvastatin (LIPITOR) 10 MG tablet Take 1 tablet (10 mg total) by mouth daily. NEED APPOINTMENT FOR ANY MORE REFILLS 04/11/18   Pleas Koch, NP  cetirizine (ZYRTEC) 10 MG tablet Take 10 mg by mouth as needed for allergies. Reported on 05/28/2016    [provider]  cholecalciferol (VITAMIN D) 1000 UNITS tablet Take 1,000 Units  by mouth daily. Reported on 05/28/2016    [provider]  cyclobenzaprine (FLEXERIL) 10 MG tablet Take 1 tablet (10 mg total) by mouth at bedtime. 11/29/15   Rubbie Battiest, RN  DULoxetine (CYMBALTA) 60 MG capsule TAKE 1 CAPSULE BY MOUTH EVERY DAY 08/22/17   Pleas Koch, NP  fluticasone (FLONASE) 50 MCG/ACT nasal spray Place 1 spray into both nostrils daily. Reported on 05/28/2016    [provider]  levothyroxine (SYNTHROID, LEVOTHROID) 75 MCG tablet Take 1 tablet (75 mcg total) by mouth daily before breakfast. NEED APPOINTMENT FOR ANY MORE REFILLS 04/11/18   Pleas Koch, NP  metoprolol succinate (TOPROL-XL) 25 MG 24 hr tablet Take 0.5 tablets (12.5 mg total) by mouth daily. NEED APPOINTMENT FOR ANY MORE REFILLS 04/11/18   Pleas Koch, NP  naratriptan (AMERGE) 2.5 MG tablet TAKE 1 TABLET BY MOUTH AS NEEDED FOR MIGRAINE. MAY REPEAT IN 4 HOURS IF SYMPTOMS PERSIST. MAX 2 TABLETS IN 24 HOURS 11/13/16   Pleas Koch, NP      PHYSICAL EXAMINATION:   VITAL SIGNS: Blood pressure (!) 148/83, pulse 75, temperature 97.8 F (36.6 C), temperature source Oral, resp. rate 17, height 5\' 5"  (1.651 m), weight 95.3 kg, SpO2 99 %.  GENERAL:  68 y.o.-year-old patient lying in the bed with no acute distress.  EYES: Pupils equal, round, reactive to light and accommodation. No scleral icterus. Extraocular muscles intact.  HEENT: Head atraumatic, normocephalic. Oropharynx and nasopharynx clear.  NECK:  Supple, no jugular venous distention. No thyroid enlargement, no tenderness.  LUNGS: Normal breath sounds bilaterally, no wheezing, rales,rhonchi or crepitation. No use of accessory muscles of respiration.  CARDIOVASCULAR: S1, S2 normal. No murmurs, rubs, or gallops.  ABDOMEN: Soft, tenderness around umbilicus and lower abdomen, nondistended. Bowel sounds present. No organomegaly or mass.  EXTREMITIES: No pedal edema, cyanosis, or clubbing.  NEUROLOGIC: Cranial nerves II through  XII are intact. Muscle strength 5/5 in all extremities. Sensation intact. Gait not checked.  PSYCHIATRIC: The patient is alert and oriented x 3.  SKIN: No obvious rash, lesion, or ulcer.   LABORATORY PANEL:   CBC Recent Labs  Lab 01/26/19 0357  WBC 16.5*  HGB 14.4  HCT 44.4  PLT 296  MCV 83.3  MCH 27.0  MCHC 32.4  RDW 13.0   ------------------------------------------------------------------------------------------------------------------  Chemistries  Recent Labs  Lab 01/26/19 0357  NA 138  K 3.9  CL 107  CO2 22  GLUCOSE 184*  BUN 24*  CREATININE 0.85  CALCIUM 9.1  AST 23  ALT 16  ALKPHOS 172*  BILITOT 0.4   ------------------------------------------------------------------------------------------------------------------ estimated creatinine clearance is 73.3 mL/min (by  C-G formula based on SCr of 0.85 mg/dL). ------------------------------------------------------------------------------------------------------------------ No results for input(s): TSH, T4TOTAL, T3FREE, THYROIDAB in the last 72 hours.  Invalid input(s): FREET3   Coagulation profile No results for input(s): INR, PROTIME in the last 168 hours. ------------------------------------------------------------------------------------------------------------------- No results for input(s): DDIMER in the last 72 hours. -------------------------------------------------------------------------------------------------------------------  Cardiac Enzymes No results for input(s): CKMB, TROPONINI, MYOGLOBIN in the last 168 hours.  Invalid input(s): CK ------------------------------------------------------------------------------------------------------------------ Invalid input(s): POCBNP  ---------------------------------------------------------------------------------------------------------------  Urinalysis    Component Value Date/Time   COLORURINE YELLOW 07/17/2013 [place]  07/17/2013 1644   LABSPEC 1.014 07/17/2013 1644   PHURINE 6.0 07/17/2013 1644   GLUCOSEU NEG 07/17/2013 1644   HGBUR NEG 07/17/2013 1644   BILIRUBINUR NEG 07/17/2013 1644   KETONESUR NEG 07/17/2013 1644   PROTEINUR NEG 07/17/2013 1644   UROBILINOGEN 0.2 07/17/2013 1644   NITRITE NEG 07/17/2013 1644   LEUKOCYTESUR NEG 07/17/2013 1644     RADIOLOGY: Ct Abdomen Pelvis W Contrast  Result Date: 01/26/2019 CLINICAL DATA:  Diarrhea and rectal bleeding EXAM: CT ABDOMEN AND PELVIS WITH CONTRAST TECHNIQUE: Multidetector CT imaging of the abdomen and pelvis was performed using the standard protocol following bolus administration of intravenous contrast. CONTRAST:  137mL ISOVUE-300 IOPAMIDOL (ISOVUE-300) INJECTION 61% COMPARISON:  None. FINDINGS: Lower chest:  No contributory findings. Hepatobiliary: No focal liver abnormality.Cholecystectomy with normal common bile duct diameter Pancreas: Unremarkable. Spleen: Unremarkable. Adrenals/Urinary Tract: Negative adrenals. No hydronephrosis or stone. Renal cystic densities and other low densities that are too small for densitometry. Unremarkable bladder when accounting for under distention. Stomach/Bowel: Submucosal low-density thickening of the left colon with normal rectum. The appendix is not clearly identified. No pericecal inflammation . Moderately distended stomach without inflammatory changes. Vascular/Lymphatic: Ischemic colitis is considered given the vascular distribution. Major arterial and venous structures are patent, including the IMA, IMV, and left colic vein. No mass or adenopathy. Reproductive:Hysterectomy. Other: No ascites or pneumoperitoneum. Musculoskeletal: No acute abnormalities. IMPRESSION: Colitis centered at the descending colon. Electronically Signed   By: Monte Fantasia M.D.   On: 01/26/2019 05:52    EKG: Orders placed or performed in visit on 02/25/17  . EKG 12-Lead    IMPRESSION AND PLAN: 68 year old female patient with a known  history of endometriosis, hiatal hernia, herpes simplex virus infection, hyperlipidemia, hypertension, hypothyroidism, TIA in the past presented to the emergency room for abdominal pain.    -Acute colitis Admit patient to medical floor Start patient on IV Levaquin Flagyl antibiotics IV fluids Keep patient n.p.o. for now  -Gastrointestinal bleeding secondary to colitis Monitor hemoglobin hematocrit IV Protonix 40 MDQ 12 hourly Gastroenterology consultation  -Abdominal pain Pain management with IV morphine as needed  -DVT prophylaxis Sequential compression device to lower extremities  All the records are reviewed and case discussed with ED provider. Management plans discussed with the patient, family and they are in agreement.  CODE STATUS:Full code    Code Status Orders  (From admission, onward)         Start     Ordered   01/26/19 0912  Full code  Continuous     01/26/19 0912        Code Status History    This patient has a current code status but no historical code status.     TOTAL TIME TAKING CARE OF THIS PATIENT: 52 minutes.    Saundra Shelling M.D on 01/26/2019 at 9:22 AM  Between 7am to 6pm - Pager - [phone]  After 6pm go to [email or web address] -  password [email or web address] [facility]  [facility]  Office  [phone]  CC: Primary care physician; Pleas Koch, NP

## 2019-01-26 NOTE — ED Notes (Signed)
Pt knows that a stool sample is needed.

## 2019-01-26 NOTE — Progress Notes (Signed)
Advanced care plan.  Purpose of the Encounter: CODE STATUS  Parties in Attendance: Patient  Patient's Decision Capacity: Good  Subjective/Patient's story: Presented to emergency room abdominal pain, blood in the stool   Objective/Medical story Patient has bloody diarrhea and abdominal discomfort CT abdomen revealed colitis and needs antibiotics and fluids   Goals of care determination:  Advance care directives goals of care and treatment plan discussed Patient wants everything done which includes CPR, intubation ventilator if the need arises   CODE STATUS: Full code   Time spent discussing advanced care planning: 16 minutes

## 2019-01-26 NOTE — ED Triage Notes (Signed)
Pt states bilateral lower abd pain and diarrhea that began at 2200. Pt states rectal bleeding that began this am. Pt states does have nausea, no vomiting.

## 2019-01-26 NOTE — ED Notes (Signed)
Sample was provided but urine was mixed in with stool. Pt informed a new sample would be needed.

## 2019-01-26 NOTE — Progress Notes (Signed)
Dr Allen Norris gave an order for a clear liquid diet

## 2019-01-26 NOTE — Consult Note (Signed)
Lucilla Lame, MD [facility]  [address], [place], [place] [address] Phone: [phone] Fax : [phone]  Consultation  Referring Provider:     Dr. Estanislado Pandy Primary Care Physician:  Pleas Koch, NP Primary Gastroenterologist: [facility]         Reason for Consultation:     Rectal bleeding diarrhea and abdominal pain  Date of Admission:  01/26/2019 Date of Consultation:  01/26/2019         HPI:   Denise [name] is a 68 y.o. female who reports having a colonoscopy 3 years ago in [place].  At that time the patient was found to have an adenomatous polyp.  The patient also reports that she has chronic heartburn and has been on over-the-counter Prevacid.  She states that she sometimes feels like food is getting stuck and this is been going on for over a year.  The patient now reports that she was admitted to the hospital because she started to have severe abdominal pain when she was out to dinner yesterday.  She went to the bathroom and had diarrhea without it showing any blood.  The patient then continued to have lower abdominal cramps and went to the bathroom again and she saw her blood at that time.  The patient endorses pain of 10 out of 10 when the pain started yesterday but she is now stating that it is a 7 or 8 out of 10.  She reports that the blood is still present but the frequency of the diarrhea has decreased.  She was admitted and started on antibiotics and was found to have an elevated white cell count of 16.5 with a elevated lactic acid that has come back down to normal.  She denies ever having any symptoms like this in the past.  Past Medical History:  Diagnosis Date  . Allergy    seasonal  . Arthritis   . Bursitis of hip    Left  . Constipation    90%of the time c/o constipation, occ has diarrhea - uses stool softener   . Endometriosis   . Fibroid   . GERD (gastroesophageal reflux disease)   . Hiatal hernia   . HSV-1 (herpes simplex virus 1) infection    . Hyperlipidemia   . Hyperplastic colon polyp   . Hypertension   . Hypothyroidism   . Low back pain   . Migraine   . Osteopenia 2008   -1.2 FEMORAL NECK  . TIA (transient ischemic attack) 08-10-2013    Past Surgical History:  Procedure Laterality Date  . ABDOMINAL HYSTERECTOMY  1989   TAH, [place]  2003  . COLONOSCOPY    . KNEE SURGERY  2004   right  . NASAL SEPTUM SURGERY  2012  . PITUITARY SURGERY  2001  . POLYPECTOMY    . SHOULDER SURGERY  2007   right  . THYROID LOBECTOMY  2002    Prior to Admission medications   Medication Sig Start Date End Date Taking? Authorizing Provider  aspirin 81 MG tablet Take 81 mg by mouth daily.   Yes [provider]  diclofenac sodium (VOLTAREN) 1 % GEL Apply 1 g topically 4 (four) times daily as needed for pain. 06/21/17  Yes [provider]  gabapentin (NEURONTIN) 100 MG capsule Take 100 mg by mouth 3 (three) times daily. 01/19/16  Yes [provider]  lansoprazole (PREVACID) 15 MG capsule Take 15 mg by mouth 2 (two) times daily  before a meal.    Yes [provider]  lisinopril (PRINIVIL,ZESTRIL) 5 MG tablet Take 5 mg by mouth daily.   Yes [provider]  atorvastatin (LIPITOR) 10 MG tablet Take 1 tablet (10 mg total) by mouth daily. NEED APPOINTMENT FOR ANY MORE REFILLS 04/11/18   Pleas Koch, NP  cetirizine (ZYRTEC) 10 MG tablet Take 10 mg by mouth as needed for allergies. Reported on 05/28/2016    [provider]  cholecalciferol (VITAMIN D) 1000 UNITS tablet Take 1,000 Units by mouth daily. Reported on 05/28/2016    [provider]  cyclobenzaprine (FLEXERIL) 10 MG tablet Take 1 tablet (10 mg total) by mouth at bedtime. Patient taking differently: Take 10 mg by mouth 3 (three) times daily as needed.  11/29/15   Rubbie Battiest, RN  DULoxetine (CYMBALTA) 60 MG capsule TAKE 1 CAPSULE BY MOUTH EVERY DAY 08/22/17   Pleas Koch, NP  esomeprazole (NEXIUM) 40 MG  capsule Take 40 mg by mouth every morning. Before breakfast    [provider]  fluticasone (FLONASE) 50 MCG/ACT nasal spray Place 1 spray into both nostrils daily as needed. Reported on 05/28/2016     [provider]  levothyroxine (SYNTHROID, LEVOTHROID) 75 MCG tablet Take 1 tablet (75 mcg total) by mouth daily before breakfast. NEED APPOINTMENT FOR ANY MORE REFILLS 04/11/18   Pleas Koch, NP  metoprolol succinate (TOPROL-XL) 25 MG 24 hr tablet Take 0.5 tablets (12.5 mg total) by mouth daily. NEED APPOINTMENT FOR ANY MORE REFILLS 04/11/18   Pleas Koch, NP  naratriptan (AMERGE) 2.5 MG tablet TAKE 1 TABLET BY MOUTH AS NEEDED FOR MIGRAINE. MAY REPEAT IN 4 HOURS IF SYMPTOMS PERSIST. MAX 2 TABLETS IN 24 HOURS 11/13/16   Pleas Koch, NP    Family History  Problem Relation Age of Onset  . Heart disease Father   . Cancer Mother        brain tumor  . Breast cancer Maternal Aunt   . Parkinson's disease Sister   . Colon cancer Neg Hx   . Colon polyps Neg Hx   . Rectal cancer Neg Hx   . Stomach cancer Neg Hx   . Thyroid disease Neg Hx   . Esophageal cancer Neg Hx      Social History   Tobacco Use  . Smoking status: Never Smoker  . Smokeless tobacco: Never Used  Substance Use Topics  . Alcohol use: Yes    Alcohol/week: 0.0 standard drinks    Comment: 1 every 3 months  . Drug use: No    Allergies as of 01/26/2019 - Review Complete 01/26/2019  Allergen Reaction Noted  . Tramadol Other (See Comments) 02/13/2012    Review of Systems:    All systems reviewed and negative except where noted in HPI.   Physical Exam:  Vital signs in last 24 hours: Temp:  [97.8 F (36.6 C)-98.9 F (37.2 C)] 97.8 F (36.6 C) (02/10 1248) Pulse Rate:  [71-98] 98 (02/10 1248) Resp:  [16-18] 18 (02/10 1248) BP: (138-155)/(77-87) 138/78 (02/10 1314) SpO2:  [93 %-100 %] 99 % (02/10 1248) Weight:  [95.3 kg] 95.3 kg (02/10 0350) Last BM Date: 01/26/19 General:    Pleasant, cooperative in NAD Head:  Normocephalic and atraumatic. Eyes:   No icterus.   Conjunctiva pink. PERRLA. Ears:  Normal auditory acuity. Neck:  Supple; no masses or thyroidomegaly Lungs: Respirations even and unlabored. Lungs clear to auscultation bilaterally.   No wheezes, crackles, or rhonchi.  Heart:  Regular rate and rhythm;  Without murmur, clicks, rubs or gallops Abdomen:  Soft, nondistended, positive suprapubic and left lower quadrant tenderness. Normal bowel sounds. No appreciable masses or hepatomegaly.  No rebound or guarding.  Rectal:  Not performed. Msk:  Symmetrical without gross deformities.    Extremities:  Without edema, cyanosis or clubbing. Neurologic:  Alert and oriented x3;  grossly normal neurologically. Skin:  Intact without significant lesions or rashes. Cervical Nodes:  No significant cervical adenopathy. Psych:  Alert and cooperative. Normal affect.  LAB RESULTS: Recent Labs    01/26/19 0357  WBC 16.5*  HGB 14.4  HCT 44.4  PLT 296   BMET Recent Labs    01/26/19 0357  NA 138  K 3.9  CL 107  CO2 22  GLUCOSE 184*  BUN 24*  CREATININE 0.85  CALCIUM 9.1   LFT Recent Labs    01/26/19 0357  PROT 7.5  ALBUMIN 4.1  AST 23  ALT 16  ALKPHOS 172*  BILITOT 0.4   PT/INR No results for input(s): LABPROT, INR in the last 72 hours.  STUDIES: Ct Abdomen Pelvis W Contrast  Result Date: 01/26/2019 CLINICAL DATA:  Diarrhea and rectal bleeding EXAM: CT ABDOMEN AND PELVIS WITH CONTRAST TECHNIQUE: Multidetector CT imaging of the abdomen and pelvis was performed using the standard protocol following bolus administration of intravenous contrast. CONTRAST:  163mL ISOVUE-300 IOPAMIDOL (ISOVUE-300) INJECTION 61% COMPARISON:  None. FINDINGS: Lower chest:  No contributory findings. Hepatobiliary: No focal liver abnormality.Cholecystectomy with normal common bile duct diameter Pancreas: Unremarkable. Spleen: Unremarkable. Adrenals/Urinary Tract: Negative  adrenals. No hydronephrosis or stone. Renal cystic densities and other low densities that are too small for densitometry. Unremarkable bladder when accounting for under distention. Stomach/Bowel: Submucosal low-density thickening of the left colon with normal rectum. The appendix is not clearly identified. No pericecal inflammation . Moderately distended stomach without inflammatory changes. Vascular/Lymphatic: Ischemic colitis is considered given the vascular distribution. Major arterial and venous structures are patent, including the IMA, IMV, and left colic vein. No mass or adenopathy. Reproductive:Hysterectomy. Other: No ascites or pneumoperitoneum. Musculoskeletal: No acute abnormalities. IMPRESSION: Colitis centered at the descending colon. Electronically Signed   By: Monte Fantasia M.D.   On: 01/26/2019 05:52      Impression / Plan:   Assessment: Active Problems:   Colitis   Denise [name] is a 68 y.o. y/o female with a CT scan showing colitis of the descending colon.  The patient's presentation including rectal bleeding diarrhea and abdominal cramps are most consistent with her having ischemic colitis.  This is also consistent with her elevated lactic acid level.  The patient C. difficile was negative.  She also had a negative GI panel.  Plan:  This patient likely has had a episode of ischemic colitis which should get better with hydration and she is presently on antibiotics for her elevated white cell count and probable translocation of bacteria through her ischemic colon.  The patient should be continued on her PPI due to her history of heartburn and should also continue with pain medication and hydration.  The patient has been explained the plan and agrees with it.  There is no reason to plan on any colonoscopy at this time.  If her dysphasia does not improve on her twice a day Protonix at 40 mg then she may need an EGD as an outpatient.  Thank you for involving me in the care of  this patient.      LOS: 0 days  Lucilla Lame, MD  01/26/2019, 1:49 PM    Note: This dictation was prepared with Dragon dictation along with smaller phrase technology. Any transcriptional errors that result from this process are unintentional.

## 2019-01-26 NOTE — Progress Notes (Signed)
Patient requesting ice chips and a heating pad.  Order received from Dr Estanislado Pandy for both

## 2019-01-26 NOTE — ED Notes (Signed)
Pt is AOx4, vss, she is in bed with rails upx1 and c/o pain at 8/10. Call bell is within reach. We will continue to monitor the pt.

## 2019-01-26 NOTE — ED Notes (Signed)
Pt is being admitted to [facility] [address]. Pt and family verbalized understanding of the admission. Pt AOx4, vss, she denies any pain at this time. Report was given to Rochel Brome, RN and she verbalized understanding of the report given.

## 2019-01-26 NOTE — ED Notes (Signed)
Admitting Provider at bedside. 

## 2019-01-26 NOTE — ED Provider Notes (Signed)
[facility] Emergency Department Provider Note  ____________________________________________   I have reviewed the triage vital signs and the nursing notes.   HISTORY  Chief Complaint Rectal Bleeding and Abdominal Pain    HPI Denise [name] is a 68 y.o. female with medical history as listed below who presents for evaluation of acute onset severe abdominal pain and multiple episodes of watery diarrhea with bright red blood per rectum.  She reports that the symptoms started this evening at around 11 PM, about an hour after she and her husband had a late dinner at a restaurant.  They had the same foods except for a blue cheese dressing which she had and he did not.  About an hour later she developed some aching cramping abdominal pain and need to go to the bathroom urgently.  She had multiple episodes of watery stools and crampy abdominal pain with nausea but no vomiting.  Just prior to arrival in the emergency department, after several hours of symptoms, she began to see bright red blood in her stool, and after several more episodes there seem to be mostly just blood and very little stool.  At about the same time she developed the bleeding, she developed sharp stabbing abdominal pains.  She had chills but no fever.  She denies chest pain and shortness of breath.  She has never had symptoms like this in the past.  She has had multiple prior abdominal surgeries.  Nothing in particular makes the symptoms better or worse and she is reporting severe pain upon arrival to the emergency department.  Past Medical History:  Diagnosis Date  . Allergy    seasonal  . Arthritis   . Bursitis of hip    Left  . Constipation    90%of the time c/o constipation, occ has diarrhea - uses stool softener   . Endometriosis   . Fibroid   . GERD (gastroesophageal reflux disease)   . Hiatal hernia   . HSV-1 (herpes simplex virus 1) infection   . Hyperlipidemia   . Hyperplastic colon  polyp   . Hypertension   . Hypothyroidism   . Low back pain   . Migraine   . Osteopenia 2008   -1.2 FEMORAL NECK  . TIA (transient ischemic attack) 08-10-2013    Patient Active Problem List   Diagnosis Date Noted  . Colitis 01/26/2019  . Chest pain 02/25/2017  . Welcome to Medicare preventive visit 08/25/2015  . Essential hypertension 08/25/2015  . Pituitary adenoma ([place]) 05/12/2014  . Facial flushing 05/12/2014  . GERD (gastroesophageal reflux disease) 09/01/2012  . Hypothyroidism   . Osteopenia   . Migraine   . Fibroid   . HSV-1 (herpes simplex virus 1) infection   . Achilles tendon pain 04/05/2011    Past Surgical History:  Procedure Laterality Date  . ABDOMINAL HYSTERECTOMY  1989   TAH, [place]  2003  . COLONOSCOPY    . KNEE SURGERY  2004   right  . NASAL SEPTUM SURGERY  2012  . PITUITARY SURGERY  2001  . POLYPECTOMY    . SHOULDER SURGERY  2007   right  . THYROID LOBECTOMY  2002    Prior to Admission medications   Medication Sig Start Date End Date Taking? Authorizing Provider  aspirin 81 MG tablet Take 81 mg by mouth daily.   Yes [provider]  lansoprazole (PREVACID) 15 MG capsule Take 15 mg by mouth 2 (two) times daily before a meal.  Yes [provider]  atorvastatin (LIPITOR) 10 MG tablet Take 1 tablet (10 mg total) by mouth daily. NEED APPOINTMENT FOR ANY MORE REFILLS 04/11/18   Pleas Koch, NP  cetirizine (ZYRTEC) 10 MG tablet Take 10 mg by mouth as needed for allergies. Reported on 05/28/2016    [provider]  cholecalciferol (VITAMIN D) 1000 UNITS tablet Take 1,000 Units by mouth daily. Reported on 05/28/2016    [provider]  cyclobenzaprine (FLEXERIL) 10 MG tablet Take 1 tablet (10 mg total) by mouth at bedtime. 11/29/15   Rubbie Battiest, RN  DULoxetine (CYMBALTA) 60 MG capsule TAKE 1 CAPSULE BY MOUTH EVERY DAY 08/22/17   Pleas Koch, NP  fluticasone (FLONASE) 50 MCG/ACT nasal spray  Place 1 spray into both nostrils daily. Reported on 05/28/2016    [provider]  levothyroxine (SYNTHROID, LEVOTHROID) 75 MCG tablet Take 1 tablet (75 mcg total) by mouth daily before breakfast. NEED APPOINTMENT FOR ANY MORE REFILLS 04/11/18   Pleas Koch, NP  metoprolol succinate (TOPROL-XL) 25 MG 24 hr tablet Take 0.5 tablets (12.5 mg total) by mouth daily. NEED APPOINTMENT FOR ANY MORE REFILLS 04/11/18   Pleas Koch, NP  naratriptan (AMERGE) 2.5 MG tablet TAKE 1 TABLET BY MOUTH AS NEEDED FOR MIGRAINE. MAY REPEAT IN 4 HOURS IF SYMPTOMS PERSIST. MAX 2 TABLETS IN 24 HOURS 11/13/16   Pleas Koch, NP    Allergies Tramadol  Family History  Problem Relation Age of Onset  . Heart disease Father   . Cancer Mother        brain tumor  . Breast cancer Maternal Aunt   . Parkinson's disease Sister   . Colon cancer Neg Hx   . Colon polyps Neg Hx   . Rectal cancer Neg Hx   . Stomach cancer Neg Hx   . Thyroid disease Neg Hx   . Esophageal cancer Neg Hx     Social History Social History   Tobacco Use  . Smoking status: Never Smoker  . Smokeless tobacco: Never Used  Substance Use Topics  . Alcohol use: Yes    Alcohol/week: 0.0 standard drinks    Comment: 1 every 3 months  . Drug use: No    Review of Systems Constitutional: Chills Eyes: No visual changes. ENT: No sore throat. Cardiovascular: Denies chest pain. Respiratory: Denies shortness of breath. Gastrointestinal: Abdominal pain with diarrhea and bright red blood per rectum as described above. Genitourinary: Negative for dysuria. Musculoskeletal: Negative for neck pain.  Negative for back pain. Integumentary: Negative for rash. Neurological: Negative for headaches, focal weakness or numbness.   ____________________________________________   PHYSICAL EXAM:  VITAL SIGNS: ED Triage Vitals  Enc Vitals Group     BP 01/26/19 0351 (!) 155/87     Pulse Rate 01/26/19 0351 74     Resp 01/26/19 0351 16      Temp 01/26/19 0351 98.9 F (37.2 C)     Temp src --      SpO2 01/26/19 0351 100 %     Weight 01/26/19 0350 95.3 kg (210 lb)     Height 01/26/19 0350 1.651 m (5\' 5" )     Head Circumference --      Peak Flow --      Pain Score 01/26/19 0350 10     Pain Loc --      Pain Edu? --      Excl. in [place]? --     Constitutional: Alert and oriented.  Ill-appearing and in distress from pain. Eyes: Conjunctivae are normal.  Head: Atraumatic. Nose: No congestion/rhinnorhea. Mouth/Throat: Mucous membranes are moist. Neck: No stridor.  No meningeal signs.   Cardiovascular: Normal rate, regular rhythm. Good peripheral circulation. Grossly normal heart sounds. Respiratory: Normal respiratory effort.  No retractions. Lungs CTAB. Gastrointestinal: Soft and nondistended.  Diffuse tenderness to palpation throughout the abdomen but without peritonitis. Rectal: Deferred because I saw bright red blood in the toilet after she had a bowel movement Musculoskeletal: No lower extremity tenderness nor edema. No gross deformities of extremities. Neurologic:  Normal speech and language. No gross focal neurologic deficits are appreciated.  Skin:  Skin is warm, dry and intact. No rash noted.   ____________________________________________   LABS (all labs ordered are listed, but only abnormal results are displayed)  Labs Reviewed  COMPREHENSIVE METABOLIC PANEL - Abnormal; Notable for the following components:      Result Value   Glucose, Bld 184 (*)    BUN 24 (*)    Alkaline Phosphatase 172 (*)    All other components within normal limits  CBC - Abnormal; Notable for the following components:   WBC 16.5 (*)    RBC 5.33 (*)    All other components within normal limits  LACTIC ACID, PLASMA - Abnormal; Notable for the following components:   Lactic Acid, Venous 2.3 (*)    All other components within normal limits  C DIFFICILE QUICK SCREEN W PCR REFLEX  GASTROINTESTINAL PANEL BY PCR, STOOL (REPLACES  STOOL CULTURE)  URINE CULTURE  URINALYSIS, ROUTINE W REFLEX MICROSCOPIC  LACTIC ACID, PLASMA  TYPE AND SCREEN   ____________________________________________  EKG  None - EKG not ordered by ED physician ____________________________________________  RADIOLOGY   ED MD interpretation: Colitis centered at the descending colon, possibly ischemic but with patent major arterial and venous structures.  Official radiology report(s): Ct Abdomen Pelvis W Contrast  Result Date: 01/26/2019 CLINICAL DATA:  Diarrhea and rectal bleeding EXAM: CT ABDOMEN AND PELVIS WITH CONTRAST TECHNIQUE: Multidetector CT imaging of the abdomen and pelvis was performed using the standard protocol following bolus administration of intravenous contrast. CONTRAST:  161mL ISOVUE-300 IOPAMIDOL (ISOVUE-300) INJECTION 61% COMPARISON:  None. FINDINGS: Lower chest:  No contributory findings. Hepatobiliary: No focal liver abnormality.Cholecystectomy with normal common bile duct diameter Pancreas: Unremarkable. Spleen: Unremarkable. Adrenals/Urinary Tract: Negative adrenals. No hydronephrosis or stone. Renal cystic densities and other low densities that are too small for densitometry. Unremarkable bladder when accounting for under distention. Stomach/Bowel: Submucosal low-density thickening of the left colon with normal rectum. The appendix is not clearly identified. No pericecal inflammation . Moderately distended stomach without inflammatory changes. Vascular/Lymphatic: Ischemic colitis is considered given the vascular distribution. Major arterial and venous structures are patent, including the IMA, IMV, and left colic vein. No mass or adenopathy. Reproductive:Hysterectomy. Other: No ascites or pneumoperitoneum. Musculoskeletal: No acute abnormalities. IMPRESSION: Colitis centered at the descending colon. Electronically Signed   By: Monte Fantasia M.D.   On: 01/26/2019 05:52     ____________________________________________   PROCEDURES  Critical Care performed: No   Procedure(s) performed:   Procedures   ____________________________________________   INITIAL IMPRESSION / ASSESSMENT AND PLAN / ED COURSE  As part of my medical decision making, I reviewed the following data within the [facility] notes reviewed and incorporated, Labs reviewed , Old chart reviewed, Discussed with admitting physician  and Notes from prior ED visits    Differential diagnosis includes, but is not limited to, infectious colitis from either  bacterial or viral source, ischemic colitis, diverticular bleed, AV malformation, neoplasm.  The acute onset not long after eating suggest an infectious process, possibly with an enterohemorrhagic pathogen such as E. coli.  However ischemic colitis is also certainly possible although it would not be as result of hypertension given that she has a normal or even elevated blood pressure.  Her blood work is notable for a stable hemoglobin, a lactic of 2.3, a normal comprehensive metabolic panel, and a normal lipase.  CT scan demonstrates colitis in the descending colon.  Given that she is afebrile and we do not yet have a source (she was not able to provide a stool specimen by the time of disposition), I am reluctant to provide antibiotics because if this is an enterohemorrhagic pathogen, antibiotics may be inappropriate and actually cause more harm.  I think it is reasonable to provide fluid resuscitation and reassess once she is able to provide a stool sample.  Additionally, the possibility of ischemic colitis must be further evaluated.  I have given her 1 L normal saline IV bolus and morphine 4 mg IV x2 doses which has mostly alleviated her discomfort.  I have asked her to remain n.p.o. and I will discuss the case with the hospitalist service.  Clinical Course as of Jan 26 821  Mon Jan 26, 2019  [id number] Discussed the case in  person just now with Dr. Marcille Blanco with the hospitalist service.  He will have one of the daytime hospitalists admit the patient.     [CF]    Clinical Course User Index [CF] Hinda Kehr, MD    ____________________________________________  FINAL CLINICAL IMPRESSION(S) / ED DIAGNOSES  Final diagnoses:  Colitis  Gastrointestinal hemorrhage, unspecified gastrointestinal hemorrhage type  Hematochezia  Generalized abdominal pain  Nausea  Elevated lactic acid level     MEDICATIONS GIVEN DURING THIS VISIT:  Medications      morphine 4 MG/ML injection 4 mg (4 mg Intravenous Given 01/26/19 0537)  ondansetron (ZOFRAN) injection 4 mg (4 mg Intravenous Given 01/26/19 0537)  iopamidol (ISOVUE-300) 61 % injection 100 mL (100 mLs Intravenous Contrast Given 01/26/19 0540)  morphine 4 MG/ML injection 4 mg (4 mg Intravenous Given 01/26/19 0744)     ED Discharge Orders    None       Note:  This document was prepared using Dragon voice recognition software and may include unintentional dictation errors.   Hinda Kehr, MD 01/26/19 [id number]

## 2019-01-26 NOTE — ED Notes (Signed)
Pt stated that the last time she went to use the bathroom instead of stool coming out just blood came out and that was around 0300.

## 2019-01-27 LAB — CBC
HCT: 38 % (ref 36.0–46.0)
Hemoglobin: 12.4 g/dL (ref 12.0–15.0)
MCH: 27.1 pg (ref 26.0–34.0)
MCHC: 32.6 g/dL (ref 30.0–36.0)
MCV: 83 fL (ref 80.0–100.0)
Platelets: 249 10*3/uL (ref 150–400)
RBC: 4.58 MIL/uL (ref 3.87–5.11)
RDW: 13.6 % (ref 11.5–15.5)
WBC: 15.8 10*3/uL — ABNORMAL HIGH (ref 4.0–10.5)
nRBC: 0 % (ref 0.0–0.2)

## 2019-01-27 LAB — BASIC METABOLIC PANEL
Anion gap: 4 — ABNORMAL LOW (ref 5–15)
BUN: 10 mg/dL (ref 8–23)
CO2: 23 mmol/L (ref 22–32)
Calcium: 8 mg/dL — ABNORMAL LOW (ref 8.9–10.3)
Chloride: 110 mmol/L (ref 98–111)
Creatinine, Ser: 0.61 mg/dL (ref 0.44–1.00)
GFR calc Af Amer: >60 mL/min (ref >60)
GFR calc non Af Amer: >60 mL/min (ref >60)
Glucose, Bld: 141 mg/dL — ABNORMAL HIGH (ref 70–99)
Potassium: 3.9 mmol/L (ref 3.5–5.1)
Sodium: 137 mmol/L (ref 135–145)

## 2019-01-27 LAB — URINE CULTURE
Culture: <10000 — AB
Special Requests: NORMAL

## 2019-01-27 MED ORDER — ATORVASTATIN CALCIUM 10 MG PO TABS
10.0000 mg | ORAL_TABLET | Freq: Every day | ORAL | Status: DC
  Administered 2019-01-27: 10 mg via ORAL
  Filled 2019-01-27: qty 1

## 2019-01-27 MED ORDER — DULOXETINE HCL 30 MG PO CPEP
60.0000 mg | ORAL_CAPSULE | Freq: Every day | ORAL | Status: DC
  Administered 2019-01-27 – 2019-01-28 (×2): 60 mg via ORAL
  Filled 2019-01-27 (×3): qty 2

## 2019-01-27 NOTE — Progress Notes (Signed)
Denise Lame, MD [facility]   [address] [place], [place] [address] Phone: [phone] Fax : [phone]   Subjective: The patient continues to have some rectal bleeding but her hemoglobin is normal.  Her pain is decreased and she is not having nausea.  The patient was able to tolerate clear liquid diet.  The patient likely has ischemic colitis that is being treated with Levaquin and Flagyl.   Objective: Vital signs in last 24 hours: Vitals:   01/26/19 1314 01/26/19 2048 01/27/19 0415 01/27/19 1152  BP: 138/78 123/65 112/62 112/72  Pulse:  82 83 75  Resp:  (!) 22 19 16   Temp:  98.7 F (37.1 C) 98.1 F (36.7 C) 98.8 F (37.1 C)  TempSrc:  Oral Oral Oral  SpO2:  97% 96% 97%  Weight:      Height:       Weight change:   Intake/Output Summary (Last 24 hours) at 01/27/2019 1501 Last data filed at 01/27/2019 8921 Gross per 24 hour  Intake 2847.35 ml  Output 1550 ml  Net 1297.35 ml     Exam: Heart:: Regular rate and rhythm, S1S2 present or without murmur or extra heart sounds Lungs: normal and clear to auscultation and percussion Abdomen: Soft mildly tender without rebound without guarding   Lab Results: @LABTEST2 @ Micro Results: Recent Results (from the past 240 hour(s))  C difficile quick scan w PCR reflex     Status: None   Collection Time: 01/26/19  5:15 AM  Result Value Ref Range Status   C Diff antigen NEGATIVE NEGATIVE Final   C Diff toxin NEGATIVE NEGATIVE Final   C Diff interpretation No C. difficile detected.  Final    Comment: Performed at [facility], [address] [place], [place] [address]  Gastrointestinal Panel by PCR , Stool     Status: None   Collection Time: 01/26/19  5:15 AM  Result Value Ref Range Status   Campylobacter species NOT DETECTED NOT DETECTED Final   Plesimonas shigelloides NOT DETECTED NOT DETECTED Final   Salmonella species NOT DETECTED NOT DETECTED Final   Yersinia enterocolitica NOT DETECTED NOT DETECTED Final    Vibrio species NOT DETECTED NOT DETECTED Final   Vibrio cholerae NOT DETECTED NOT DETECTED Final   Enteroaggregative E coli (EAEC) NOT DETECTED NOT DETECTED Final   Enteropathogenic E coli (EPEC) NOT DETECTED NOT DETECTED Final   Enterotoxigenic E coli (ETEC) NOT DETECTED NOT DETECTED Final   Shiga like toxin producing E coli (STEC) NOT DETECTED NOT DETECTED Final   Shigella/Enteroinvasive E coli (EIEC) NOT DETECTED NOT DETECTED Final   Cryptosporidium NOT DETECTED NOT DETECTED Final   Cyclospora cayetanensis NOT DETECTED NOT DETECTED Final   Entamoeba histolytica NOT DETECTED NOT DETECTED Final   Giardia lamblia NOT DETECTED NOT DETECTED Final   Adenovirus F40/41 NOT DETECTED NOT DETECTED Final   Astrovirus NOT DETECTED NOT DETECTED Final   Norovirus GI/GII NOT DETECTED NOT DETECTED Final   Rotavirus A NOT DETECTED NOT DETECTED Final   Sapovirus (I, II, IV, and V) NOT DETECTED NOT DETECTED Final    Comment: Performed at [facility], [address], [place], [place] [address]  Urine Culture     Status: Abnormal   Collection Time: 01/26/19  5:15 AM  Result Value Ref Range Status   Specimen Description   Final    URINE, CLEAN CATCH Performed at [facility], [address], [place], [place] [address]    Special Requests   Final  Normal Performed at [facility], [address], [place], [place] [address]    Culture (A)  Final    <10,000 COLONIES/mL INSIGNIFICANT GROWTH Performed at [facility] [address], [place], [place] [address]    Report Status 01/27/2019 FINAL  Final   Studies/Results: Ct Abdomen Pelvis W Contrast  Result Date: 01/26/2019 CLINICAL DATA:  Diarrhea and rectal bleeding EXAM: CT ABDOMEN AND PELVIS WITH CONTRAST TECHNIQUE: Multidetector CT imaging of the abdomen and pelvis was performed using the standard protocol following bolus administration of intravenous contrast. CONTRAST:  160mL ISOVUE-300 IOPAMIDOL  (ISOVUE-300) INJECTION 61% COMPARISON:  None. FINDINGS: Lower chest:  No contributory findings. Hepatobiliary: No focal liver abnormality.Cholecystectomy with normal common bile duct diameter Pancreas: Unremarkable. Spleen: Unremarkable. Adrenals/Urinary Tract: Negative adrenals. No hydronephrosis or stone. Renal cystic densities and other low densities that are too small for densitometry. Unremarkable bladder when accounting for under distention. Stomach/Bowel: Submucosal low-density thickening of the left colon with normal rectum. The appendix is not clearly identified. No pericecal inflammation . Moderately distended stomach without inflammatory changes. Vascular/Lymphatic: Ischemic colitis is considered given the vascular distribution. Major arterial and venous structures are patent, including the IMA, IMV, and left colic vein. No mass or adenopathy. Reproductive:Hysterectomy. Other: No ascites or pneumoperitoneum. Musculoskeletal: No acute abnormalities. IMPRESSION: Colitis centered at the descending colon. Electronically Signed   By: Monte Fantasia M.D.   On: 01/26/2019 05:52   Medications: I have reviewed the patient's current medications. Scheduled Meds: . atorvastatin  10 mg Oral q1800  . DULoxetine  60 mg Oral Daily  . Influenza vac split quadrivalent PF  0.5 mL Intramuscular Tomorrow-1000  . levothyroxine  75 mcg Oral QAC breakfast  . lisinopril  5 mg Oral Daily  . metoprolol succinate  12.5 mg Oral Daily  . pantoprazole (PROTONIX) IV  40 mg Intravenous Q12H  . pneumococcal 23 valent vaccine  0.5 mL Intramuscular Tomorrow-1000   Continuous Infusions: . sodium chloride 100 mL/hr at 01/27/19 0826  . levofloxacin (LEVAQUIN) IV 500 mg (01/27/19 0936)  . metronidazole 500 mg (01/27/19 1146)   PRN Meds:.acetaminophen **OR** acetaminophen, morphine injection, ondansetron **OR** ondansetron (ZOFRAN) IV, oxyCODONE-acetaminophen   Assessment: Active Problems:   Colitis    Hematochezia    Plan: This patient came in with rectal bleeding and suprapubic and left-sided abdominal pain.  The CT scan showed descending colon inflammation.  The patient states that the pain is now only in the left side and no longer in the suprapubic area the patient also states that she has having less bleeding.  This is all consistent with the patient having ischemic colitis.  I would continue supportive treatment.  Nothing further to do from a GI point of view.  I will sign off.  Please call if any further GI concerns or questions.  We would like to thank you for the opportunity to participate in the care of Denise [name].     LOS: 1 day   Denise [name] 01/27/2019, 3:01 PM

## 2019-01-27 NOTE — Progress Notes (Signed)
[place] [facility] [place] NAME: Denise [name]    MR#:  [id number]  DATE OF BIRTH:  1951/11/24  SUBJECTIVE:  CHIEF COMPLAINT:   Chief Complaint  Patient presents with  . Rectal Bleeding  . Abdominal Pain  Seen and evaluated today Has decreased abdominal pain Rectal bleed is also minimalized No vomitings No fever  REVIEW OF SYSTEMS:    ROS  CONSTITUTIONAL: No documented fever. No fatigue, weakness. No weight gain, no weight loss.  EYES: No blurry or double vision.  ENT: No tinnitus. No postnasal drip. No redness of the oropharynx.  RESPIRATORY: No cough, no wheeze, no hemoptysis. No dyspnea.  CARDIOVASCULAR: No chest pain. No orthopnea. No palpitations. No syncope.  GASTROINTESTINAL: No nausea, no vomiting or diarrhea. Decreased abdominal pain. No new episodes of rectal bleed GENITOURINARY: No dysuria or hematuria.  ENDOCRINE: No polyuria or nocturia. No heat or cold intolerance.  HEMATOLOGY: No anemia. No bruising. No bleeding.  INTEGUMENTARY: No rashes. No lesions.  MUSCULOSKELETAL: No arthritis. No swelling. No gout.  NEUROLOGIC: No numbness, tingling, or ataxia. No seizure-type activity.  PSYCHIATRIC: No anxiety. No insomnia. No ADD.   DRUG ALLERGIES:   Allergies  Allergen Reactions  . Tramadol Other (See Comments)    Reaction: NIGHTMARES    VITALS:  Blood pressure 112/72, pulse 75, temperature 98.8 F (37.1 C), temperature source Oral, resp. rate 16, height 5\' 5"  (1.651 m), weight 95.3 kg, SpO2 97 %.  PHYSICAL EXAMINATION:   Physical Exam  GENERAL:  68 y.o.-year-old patient lying in the bed with no acute distress.  EYES: Pupils equal, round, reactive to light and accommodation. No scleral icterus. Extraocular muscles intact.  HEENT: Head atraumatic, normocephalic. Oropharynx and nasopharynx clear.  NECK:  Supple, no jugular venous distention. No thyroid enlargement, no tenderness.  LUNGS: Normal breath sounds  bilaterally, no wheezing, rales, rhonchi. No use of accessory muscles of respiration.  CARDIOVASCULAR: S1, S2 normal. No murmurs, rubs, or gallops.  ABDOMEN: Soft, decreased tenderness around umbilicus, nondistended. Bowel sounds present. No organomegaly or mass.  EXTREMITIES: No cyanosis, clubbing or edema b/l.    NEUROLOGIC: Cranial nerves II through XII are intact. No focal Motor or sensory deficits b/l.   PSYCHIATRIC: The patient is alert and oriented x 3.  SKIN: No obvious rash, lesion, or ulcer.   LABORATORY PANEL:   CBC Recent Labs  Lab 01/27/19 0525  WBC 15.8*  HGB 12.4  HCT 38.0  PLT 249   ------------------------------------------------------------------------------------------------------------------ Chemistries  Recent Labs  Lab 01/26/19 0357 01/27/19 0525  NA 138 137  K 3.9 3.9  CL 107 110  CO2 22 23  GLUCOSE 184* 141*  BUN 24* 10  CREATININE 0.85 0.61  CALCIUM 9.1 8.0*  AST 23  --   ALT 16  --   ALKPHOS 172*  --   BILITOT 0.4  --    ------------------------------------------------------------------------------------------------------------------  Cardiac Enzymes No results for input(s): TROPONINI in the last 168 hours. ------------------------------------------------------------------------------------------------------------------  RADIOLOGY:  Ct Abdomen Pelvis W Contrast  Result Date: 01/26/2019 CLINICAL DATA:  Diarrhea and rectal bleeding EXAM: CT ABDOMEN AND PELVIS WITH CONTRAST TECHNIQUE: Multidetector CT imaging of the abdomen and pelvis was performed using the standard protocol following bolus administration of intravenous contrast. CONTRAST:  174mL ISOVUE-300 IOPAMIDOL (ISOVUE-300) INJECTION 61% COMPARISON:  None. FINDINGS: Lower chest:  No contributory findings. Hepatobiliary: No focal liver abnormality.Cholecystectomy with normal common bile duct diameter Pancreas: Unremarkable. Spleen: Unremarkable. Adrenals/Urinary Tract: Negative adrenals. No  hydronephrosis or stone.  Renal cystic densities and other low densities that are too small for densitometry. Unremarkable bladder when accounting for under distention. Stomach/Bowel: Submucosal low-density thickening of the left colon with normal rectum. The appendix is not clearly identified. No pericecal inflammation . Moderately distended stomach without inflammatory changes. Vascular/Lymphatic: Ischemic colitis is considered given the vascular distribution. Major arterial and venous structures are patent, including the IMA, IMV, and left colic vein. No mass or adenopathy. Reproductive:Hysterectomy. Other: No ascites or pneumoperitoneum. Musculoskeletal: No acute abnormalities. IMPRESSION: Colitis centered at the descending colon. Electronically Signed   By: Monte Fantasia M.D.   On: 01/26/2019 05:52     ASSESSMENT AND PLAN:  68 year old female patient with history of endometriosis, hiatal hernia, herpes simplex viral infection in the past, hyperlipidemia, hypertension, hypothyroidism, TIA currently under hospitalist service for abdominal pain, rectal bleed.  -Acute colitis improving Status post gastroenterology follow-up Continue IV Levaquin and Flagyl antibiotics IV fluids  -Gastrointestinal bleeding secondary to colitis Monitor hemoglobin hematocrit Continue IV Protonix 40 MDQ 12 hourly  -DVT prophylaxis sequential compression device to lower extremities  -Leukocytosis secondary to colitis improving  All the records are reviewed and case discussed with Care Management/Social Worker. Management plans discussed with the patient, family and they are in agreement.  CODE STATUS: Full code  DVT Prophylaxis: SCDs  TOTAL TIME TAKING CARE OF THIS PATIENT: 37 minutes.   POSSIBLE D/C IN 2 to 3 DAYS, DEPENDING ON CLINICAL CONDITION.  Saundra Shelling M.D on 01/27/2019 at 1:19 PM  Between 7am to 6pm - Pager - [phone]  After 6pm go to [email or web address] - password [email or web address]  [facility]  Office  [phone]  CC: Primary care physician; Pleas Koch, NP  Note: This dictation was prepared with Dragon dictation along with smaller phrase technology. Any transcriptional errors that result from this process are unintentional.

## 2019-01-28 LAB — CBC
HCT: 35.2 % — ABNORMAL LOW (ref 36.0–46.0)
Hemoglobin: 11.2 g/dL — ABNORMAL LOW (ref 12.0–15.0)
MCH: 27 pg (ref 26.0–34.0)
MCHC: 31.8 g/dL (ref 30.0–36.0)
MCV: 84.8 fL (ref 80.0–100.0)
Platelets: 199 10*3/uL (ref 150–400)
RBC: 4.15 MIL/uL (ref 3.87–5.11)
RDW: 13.5 % (ref 11.5–15.5)
WBC: 12.5 10*3/uL — ABNORMAL HIGH (ref 4.0–10.5)
nRBC: 0 % (ref 0.0–0.2)

## 2019-01-28 LAB — HIV ANTIBODY (ROUTINE TESTING W REFLEX): HIV Screen 4th Generation wRfx: NONREACTIVE

## 2019-01-28 LAB — BASIC METABOLIC PANEL
Anion gap: 4 — ABNORMAL LOW (ref 5–15)
BUN: 6 mg/dL — ABNORMAL LOW (ref 8–23)
CO2: 25 mmol/L (ref 22–32)
Calcium: 8 mg/dL — ABNORMAL LOW (ref 8.9–10.3)
Chloride: 111 mmol/L (ref 98–111)
Creatinine, Ser: 0.66 mg/dL (ref 0.44–1.00)
GFR calc Af Amer: >60 mL/min (ref >60)
GFR calc non Af Amer: >60 mL/min (ref >60)
Glucose, Bld: 107 mg/dL — ABNORMAL HIGH (ref 70–99)
Potassium: 3.9 mmol/L (ref 3.5–5.1)
Sodium: 140 mmol/L (ref 135–145)

## 2019-01-28 MED ORDER — OXYCODONE-ACETAMINOPHEN 5-325 MG PO TABS: 1.0000 | ORAL_TABLET | Freq: Four times a day (QID) | ORAL | 0 refills | Status: DC | PRN

## 2019-01-28 MED ORDER — LEVOFLOXACIN 500 MG PO TABS: 500.0000 mg | ORAL_TABLET | Freq: Every day | ORAL | 0 refills | Status: DC

## 2019-01-28 MED ORDER — METRONIDAZOLE 500 MG PO TABS: 500.0000 mg | ORAL_TABLET | Freq: Three times a day (TID) | ORAL | 0 refills | Status: DC

## 2019-01-28 NOTE — Discharge Summary (Signed)
[place] [facility] [place] NAME: Denise [name]    MR#:  [id number]  DATE OF BIRTH:  03-06-1951  DATE OF ADMISSION:  01/26/2019 ADMITTING PHYSICIAN: Saundra Shelling, MD  DATE OF DISCHARGE: 01/28/2019 12:52 PM  PRIMARY CARE PHYSICIAN: Pleas Koch, NP   ADMISSION DIAGNOSIS:  Hematochezia [K92.1] Colitis [K52.9] Nausea [R11.0] Generalized abdominal pain [R10.84] Elevated lactic acid level [R79.89] Gastrointestinal hemorrhage, unspecified gastrointestinal hemorrhage type [K92.2]  DISCHARGE DIAGNOSIS:  Active Problems:   Colitis   Hematochezia Elevated lactic acid Abdominal pain Gastrointestinal bleeding secondary to colitis  SECONDARY DIAGNOSIS:   Past Medical History:  Diagnosis Date  . Allergy    seasonal  . Arthritis   . Bursitis of hip    Left  . Constipation    90%of the time c/o constipation, occ has diarrhea - uses stool softener   . Endometriosis   . Fibroid   . GERD (gastroesophageal reflux disease)   . Hiatal hernia   . HSV-1 (herpes simplex virus 1) infection   . Hyperlipidemia   . Hyperplastic colon polyp   . Hypertension   . Hypothyroidism   . Low back pain   . Migraine   . Osteopenia 2008   -1.2 FEMORAL NECK  . TIA (transient ischemic attack) 08-10-2013     ADMITTING HISTORY Denise [name]  is a 68 y.o. female with a known history of endometriosis, hiatal hernia, herpes simplex virus infection, hyperlipidemia, hypertension, hypothyroidism, TIA in the past presented to the emergency room for abdominal pain.  Patient has generalized abdominal pain aching in nature 6 out of 10 on a scale of 1-10.  She also had couple of episodes of bloody stools.  She was evaluated with CT abdomen which showed colitis.  Hospitalist service was consulted for further care.  No complaints of any vomiting of blood.  HOSPITAL COURSE:  Patient was admitted to medical floor.  Patient was started on IV Levaquin and Flagyl antibiotics.   Patient was worked up with CT abdomen.  GI consultation was done during hospitalization.  Serial hemoglobin hematocrit was monitored.  Leukocytosis resolved with IV antibiotics.  Abdominal pain improved.  Patient started on diet.  Rectal bleeding also stopped.  Patient hemodynamically stable going home.  During the hospitalization GI consultation was done no acute intervention recommended.  CONSULTS OBTAINED:  Treatment Team:  Lucilla Lame, MD  DRUG ALLERGIES:   Allergies  Allergen Reactions  . Tramadol Other (See Comments)    Reaction: NIGHTMARES    DISCHARGE MEDICATIONS:   Allergies as of 01/28/2019      Reactions   Tramadol Other (See Comments)   Reaction: NIGHTMARES      Medication List    STOP taking these medications   fluticasone 50 MCG/ACT nasal spray Commonly known as:  FLONASE     TAKE these medications   aspirin 81 MG tablet Take 81 mg by mouth daily.   atorvastatin 10 MG tablet Commonly known as:  LIPITOR Take 1 tablet (10 mg total) by mouth daily. NEED APPOINTMENT FOR ANY MORE REFILLS   cetirizine 10 MG tablet Commonly known as:  ZYRTEC Take 10 mg by mouth as needed for allergies. Reported on 05/28/2016   cholecalciferol 1000 units tablet Commonly known as:  VITAMIN D Take 1,000 Units by mouth daily. Reported on 05/28/2016   cyclobenzaprine 10 MG tablet Commonly known as:  FLEXERIL Take 1 tablet (10 mg total) by mouth at bedtime. What changed:    when to take  this  reasons to take this   diclofenac sodium 1 % Gel Commonly known as:  VOLTAREN Apply 1 g topically 4 (four) times daily as needed for pain.   DULoxetine 60 MG capsule Commonly known as:  CYMBALTA TAKE 1 CAPSULE BY MOUTH EVERY DAY   esomeprazole 40 MG capsule Commonly known as:  NEXIUM Take 40 mg by mouth every morning. Before breakfast   gabapentin 100 MG capsule Commonly known as:  NEURONTIN Take 100 mg by mouth 3 (three) times daily.   lansoprazole 15 MG capsule Commonly  known as:  PREVACID Take 15 mg by mouth 2 (two) times daily before a meal.   levofloxacin 500 MG tablet Commonly known as:  LEVAQUIN Take 1 tablet (500 mg total) by mouth daily for 5 days.   levothyroxine 75 MCG tablet Commonly known as:  SYNTHROID, LEVOTHROID Take 1 tablet (75 mcg total) by mouth daily before breakfast. NEED APPOINTMENT FOR ANY MORE REFILLS   lisinopril 5 MG tablet Commonly known as:  PRINIVIL,ZESTRIL Take 5 mg by mouth daily.   metoprolol succinate 25 MG 24 hr tablet Commonly known as:  TOPROL-XL Take 0.5 tablets (12.5 mg total) by mouth daily. NEED APPOINTMENT FOR ANY MORE REFILLS   metroNIDAZOLE 500 MG tablet Commonly known as:  FLAGYL Take 1 tablet (500 mg total) by mouth 3 (three) times daily for 5 days.   naratriptan 2.5 MG tablet Commonly known as:  AMERGE TAKE 1 TABLET BY MOUTH AS NEEDED FOR MIGRAINE. MAY REPEAT IN 4 HOURS IF SYMPTOMS PERSIST. MAX 2 TABLETS IN 24 HOURS   oxyCODONE-acetaminophen 5-325 MG tablet Commonly known as:  PERCOCET/ROXICET Take 1 tablet by mouth every 6 (six) hours as needed for up to 5 days for moderate pain.       Today  Patient seen today No abdominal pain No nausea vomiting No rectal bleed Tolerating diet okay Hemodynamically stable  VITAL SIGNS:  Blood pressure (!) 145/82, pulse 81, temperature 98.3 F (36.8 C), temperature source Oral, resp. rate 18, height 5\' 5"  (1.651 m), weight 95.3 kg, SpO2 95 %.  I/O:    Intake/Output Summary (Last 24 hours) at 01/28/2019 1325 Last data filed at 01/28/2019 0900 Gross per 24 hour  Intake 2781.15 ml  Output -  Net 2781.15 ml    PHYSICAL EXAMINATION:  Physical Exam  GENERAL:  68 y.o.-year-old patient lying in the bed with no acute distress.  LUNGS: Normal breath sounds bilaterally, no wheezing, rales,rhonchi or crepitation. No use of accessory muscles of respiration.  CARDIOVASCULAR: S1, S2 normal. No murmurs, rubs, or gallops.  ABDOMEN: Soft, non-tender,  non-distended. Bowel sounds present. No organomegaly or mass.  NEUROLOGIC: Moves all 4 extremities. PSYCHIATRIC: The patient is alert and oriented x 3.  SKIN: No obvious rash, lesion, or ulcer.   DATA REVIEW:   CBC Recent Labs  Lab 01/28/19 0413  WBC 12.5*  HGB 11.2*  HCT 35.2*  PLT 199    Chemistries  Recent Labs  Lab 01/26/19 0357  01/28/19 0413  NA 138   < > 140  K 3.9   < > 3.9  CL 107   < > 111  CO2 22   < > 25  GLUCOSE 184*   < > 107*  BUN 24*   < > 6*  CREATININE 0.85   < > 0.66  CALCIUM 9.1   < > 8.0*  AST 23  --   --   ALT 16  --   --   [facility]  172*  --   --   BILITOT 0.4  --   --    < > = values in this interval not displayed.    Cardiac Enzymes No results for input(s): TROPONINI in the last 168 hours.  Microbiology Results  Results for orders placed or performed during the hospital encounter of 01/26/19  C difficile quick scan w PCR reflex     Status: None   Collection Time: 01/26/19  5:15 AM  Result Value Ref Range Status   C Diff antigen NEGATIVE NEGATIVE Final   C Diff toxin NEGATIVE NEGATIVE Final   C Diff interpretation No C. difficile detected.  Final    Comment: Performed at [facility], [address] [place], [place] [address]  Gastrointestinal Panel by PCR , Stool     Status: None   Collection Time: 01/26/19  5:15 AM  Result Value Ref Range Status   Campylobacter species NOT DETECTED NOT DETECTED Final   Plesimonas shigelloides NOT DETECTED NOT DETECTED Final   Salmonella species NOT DETECTED NOT DETECTED Final   Yersinia enterocolitica NOT DETECTED NOT DETECTED Final   Vibrio species NOT DETECTED NOT DETECTED Final   Vibrio cholerae NOT DETECTED NOT DETECTED Final   Enteroaggregative E coli (EAEC) NOT DETECTED NOT DETECTED Final   Enteropathogenic E coli (EPEC) NOT DETECTED NOT DETECTED Final   Enterotoxigenic E coli (ETEC) NOT DETECTED NOT DETECTED Final   Shiga like toxin producing E coli (STEC) NOT DETECTED NOT DETECTED  Final   Shigella/Enteroinvasive E coli (EIEC) NOT DETECTED NOT DETECTED Final   Cryptosporidium NOT DETECTED NOT DETECTED Final   Cyclospora cayetanensis NOT DETECTED NOT DETECTED Final   Entamoeba histolytica NOT DETECTED NOT DETECTED Final   Giardia lamblia NOT DETECTED NOT DETECTED Final   Adenovirus F40/41 NOT DETECTED NOT DETECTED Final   Astrovirus NOT DETECTED NOT DETECTED Final   Norovirus GI/GII NOT DETECTED NOT DETECTED Final   Rotavirus A NOT DETECTED NOT DETECTED Final   Sapovirus (I, II, IV, and V) NOT DETECTED NOT DETECTED Final    Comment: Performed at [facility], [address] [place], [place] [address]  Urine Culture     Status: Abnormal   Collection Time: 01/26/19  5:15 AM  Result Value Ref Range Status   Specimen Description   Final    URINE, CLEAN CATCH Performed at [facility], [address], [place], [place] [address]    Special Requests   Final    Normal Performed at [facility], [address], [place], [place] [address]    Culture (A)  Final    <10,000 COLONIES/mL INSIGNIFICANT GROWTH Performed at [facility], [address], [place],  [address]    Report Status 01/27/2019 FINAL  Final    RADIOLOGY:  No results found.  Follow up with PCP in 1 week.  Management plans discussed with the patient, family and they are in agreement.  CODE STATUS: Full code    Code Status Orders  (From admission, onward)         Start     Ordered   01/26/19 0912  Full code  Continuous     01/26/19 0912        Code Status History    This patient has a current code status but no historical code status.      TOTAL TIME TAKING CARE OF THIS PATIENT ON DAY OF DISCHARGE: more than 34 minutes.   Saundra Shelling M.D on  01/28/2019 at 1:25 PM  Between 7am to 6pm - Pager - [phone]  After 6pm go to [email or web address] - password [email or web address] [facility]  Office  [phone]  CC: Primary care  physician; Pleas Koch, NP  Note: This dictation was prepared with Dragon dictation along with smaller phrase technology. Any transcriptional errors that result from this process are unintentional.

## 2019-01-28 NOTE — Progress Notes (Signed)
Patient cleared for discharge. IV removed. Paper prescriptions given. Discharge instructions given. All patient questions answered. Discharged to home via POV.

## 2019-01-30 ENCOUNTER — Telehealth: Payer: Self-pay

## 2019-01-30 NOTE — Telephone Encounter (Signed)
Transition Care Management Follow-up Telephone Call   Date discharged? 01/28/2019   How have you been since you were released from the hospital? Patient feels terrible. Feels like she is worse then when she was in the hospital. A lot of pain in the LLQ and some in the RLQ with pressure. She was prescribed Percocet (15 tablets) which she took 1 tablet every 6 hours yesterday-01/29/2019- but none today so far. Pain does get better with the medication but never completely goes away. No vomiting, no fever present. Some nausea present. She is trying to follow soft diet but is afraid to eat too much in case she gets sick (mainly eating things like apple sauce). She is drinking a lot of water. She is not sure if her pain is also coming from not having a bowel movement since about 01/25/2019. She usually has a bowel movement every 2 to 3 days. But she also has not had much at all to eat in the past few days per patient. She is scheduled for 02/05/2019 to come and see [place] for follow up, do we need to move this up? Also patient stated she was going to call Dr. Lenord Carbo is the one that saw her at the hospital and discuss these symptoms also. I advised patient that I am not sure if they would be able to help since she is not a patient of his outside of the hospital visit, and that she actually has been seen by [facility] in the past for colonoscopy/endoscopy. Patient was going to still call Dr .Allen Norris and see what they tell her. Patient was advised that I would check with Alma Friendly to see how we need to proceed at this point and give her a call. She is also still taking Levaquin and Flagyl as prescribed.   Do you understand why you were in the hospital? yes   Do you understand the discharge instructions? yes   Where were you discharged to? home   Items Reviewed:  Medications reviewed: yes  Allergies reviewed: yes  Dietary changes reviewed: yes-soft diet  Referrals reviewed: yes   Functional  Questionnaire:   Activities of Daily Living (ADLs):   She states they are independent in the following: dressing, grooming, hygiene, bathing, toileting, ambulation., feeding. States they require assistance with the following: none   Any transportation issues/concerns?: no   Any patient concerns? Yes-see note above.   Confirmed importance and date/time of follow-up visits scheduled yes  Provider Appointment booked with Alma Friendly on 02/05/2019.  Confirmed with patient if condition begins to worsen call PCP or go to the ER.  Patient was given the office number and encouraged to call back with question or concerns.  : yes

## 2019-01-30 NOTE — Addendum Note (Signed)
Addended by: Helene Shoe on: 01/30/2019 02:23 PM   Modules accepted: Orders

## 2019-01-30 NOTE — Telephone Encounter (Signed)
Unfortunately we cannot refill her Percocet as she just had this filled on 01/28/19, I don't typically prescribe this anyway. Have her limit her use of the Percocet and use Tylenol in between. Make sure she knows that the Percocet has Tylenol so don't exceed 3000 mg of Tylenol in 24 hours.  We can discuss Monday.

## 2019-01-30 NOTE — Telephone Encounter (Addendum)
Pt continues with abd pain and pt will be out of pain med over weekend and wants to know if can get refill of oxycodone apap  [facility] and pt rescheduled HFU to 02/02/19 at 3:20 (approved by Gentry Fitz NP). If pt develops worse abd pain or has bleeding pt will go to ED.

## 2019-01-30 NOTE — Telephone Encounter (Signed)
Pt notified as instructed and voiced understanding. Pt will keep appt on 02/02/19.

## 2019-01-30 NOTE — Telephone Encounter (Signed)
   Please review below. Thank you    How have you been since you were released from the hospital? Patient feels terrible. Feels like she is worse then when she was in the hospital. A lot of pain in the LLQ and some in the RLQ with pressure. She was prescribed Percocet (15 tablets) which she took 1 tablet every 6 hours yesterday-01/29/2019- but none today so far. Pain does get better with the medication but never completely goes away. No vomiting, no fever present. Some nausea present. She is trying to follow soft diet but is afraid to eat too much in case she gets sick (mainly eating things like apple sauce). She is drinking a lot of water. She is not sure if her pain is also coming from not having a bowel movement since about 01/25/2019. She usually has a bowel movement every 2 to 3 days. But she also has not had much at all to eat in the past few days per patient. She is scheduled for 02/05/2019 to come and see [place] for follow up, do we need to move this up? Also patient stated she was going to call Dr. Lenord Carbo is the one that saw her at the hospital and discuss these symptoms also. I advised patient that I am not sure if they would be able to help since she is not a patient of his outside of the hospital visit, and that she actually has been seen by [facility] in the past for colonoscopy/endoscopy. Patient was going to still call Dr .Allen Norris and see what they tell her. Patient was advised that I would check with Alma Friendly to see how we need to proceed at this point and give her a call. She is also still taking Levaquin and Flagyl as prescribed.

## 2019-01-30 NOTE — Telephone Encounter (Signed)
Noted  

## 2019-02-02 ENCOUNTER — Ambulatory Visit (INDEPENDENT_AMBULATORY_CARE_PROVIDER_SITE_OTHER): Payer: Medicare Other | Admitting: Primary Care

## 2019-02-02 ENCOUNTER — Encounter: Payer: Self-pay | Admitting: Primary Care

## 2019-02-02 VITALS — BP 136/92 | HR 88 | Temp 98.1°F | Ht 65.0 in | Wt 198.5 lb

## 2019-02-02 DIAGNOSIS — R1310 Dysphagia, unspecified: Secondary | ICD-10-CM

## 2019-02-02 DIAGNOSIS — K219 Gastro-esophageal reflux disease without esophagitis: Secondary | ICD-10-CM

## 2019-02-02 DIAGNOSIS — G43901 Migraine, unspecified, not intractable, with status migrainosus: Secondary | ICD-10-CM

## 2019-02-02 DIAGNOSIS — K529 Noninfective gastroenteritis and colitis, unspecified: Secondary | ICD-10-CM

## 2019-02-02 DIAGNOSIS — R51 Headache: Secondary | ICD-10-CM | POA: Diagnosis not present

## 2019-02-02 DIAGNOSIS — E890 Postprocedural hypothyroidism: Secondary | ICD-10-CM | POA: Diagnosis not present

## 2019-02-02 DIAGNOSIS — I10 Essential (primary) hypertension: Secondary | ICD-10-CM

## 2019-02-02 DIAGNOSIS — R519 Headache, unspecified: Secondary | ICD-10-CM

## 2019-02-02 DIAGNOSIS — Z09 Encounter for follow-up examination after completed treatment for conditions other than malignant neoplasm: Secondary | ICD-10-CM

## 2019-02-02 DIAGNOSIS — Z23 Encounter for immunization: Secondary | ICD-10-CM

## 2019-02-02 DIAGNOSIS — E785 Hyperlipidemia, unspecified: Secondary | ICD-10-CM | POA: Insufficient documentation

## 2019-02-02 MED ORDER — LISINOPRIL 10 MG PO TABS: 10.0000 mg | ORAL_TABLET | Freq: Every day | ORAL | 0 refills | Status: DC

## 2019-02-02 MED ORDER — DULOXETINE HCL 60 MG PO CPEP: ORAL_CAPSULE | ORAL | 3 refills | Status: DC

## 2019-02-02 NOTE — Assessment & Plan Note (Signed)
Compliant to levothyroxine 75 mcg however is taking inappropriately. Discussed proper directions for taking levothyroxine, she verbalized understanding. Repeat TSH pending.

## 2019-02-02 NOTE — Patient Instructions (Signed)
Stop by the lab prior to leaving today. I will notify you of your results once received.   We've increased the dose of your lisinopril to 10 mg. I sent a new prescription to your pharmacy.  Make sure to take your levothyroxine with water only, no food or medications for 30 minutes. No heartburn medication, vitamins, calcium, iron pills, magnesium within four hours.  Continue your antibiotics as prescribed.  Please notify me if you don't hear anything within a few months regarding your repeat colonoscopy.  Schedule a follow up visit for 2-3 weeks for blood pressure check.  It was a pleasure to see you today!

## 2019-02-02 NOTE — Assessment & Plan Note (Signed)
Also with history of TIA years ago. Compliant to atorvastatin 10, continue same. Repeat lipids pending.

## 2019-02-02 NOTE — Assessment & Plan Note (Signed)
Recent hospital visit, diagnosed with ischemic colitis. Overall unremarkable hospital stay, improved prior to discharge. She is compliant to her oral antibiotics and will complete her regimen as prescribed. Discussed to continue a bland diet for another 24 to 48 hours, slowly introduce her regular diet as tolerated as this should help with constipation.  Discussed to avoid laxatives.  Continue Colace. She will likely need a repeat colonoscopy later this summer, she will update if she does not receive a reminder letter.  At that point we will place a referral for both colonoscopy and endoscopy.  All hospital labs, notes, imaging reviewed.

## 2019-02-02 NOTE — Assessment & Plan Note (Addendum)
Chronic, also with dysphagia. Continue Nexium 20 mg twice daily OTC, consider pantoprazole if needed. She will update.  She will likely need endoscopy later this year, she spoke with GI during her recent hospital stay.

## 2019-02-02 NOTE — Progress Notes (Signed)
Subjective:    Patient ID: Denise [name], female    DOB: [birth year], 68 y.o.   MRN: [id number]  HPI  Denise [name] is a 68 year old female who presents today for [facility] Follow Up and general follow up. She has not been seen in our office since March of 2018. She moved back from [place] one month ago.   1) TCM Hospital Follow Up:   She presented to [facility] ED on January 26, 2019 with a chief complaint of diarrhea, severe abdominal pain, rectal bleeding.  Her symptoms began about an hour after eating dinner at a [employer] with her husband.  She experienced multiple watery stools with abdominal cramping and nausea.  No vomiting.  During her stay in the emergency department she underwent CT scan of her abdomen/pelvis which revealed a descending colon colitis.  She was treated with IV fluids and IV morphine and admitted for further evaluation.  During her hospital stay she was treated with IV Levaquin and Flagyl, and IV Protonix.  GI consulted who suspected ischemic colitis and agreed to continue IV antibiotics.  Oral pantoprazole was recommended at a dose of 40 mg twice daily due to dysphasia.  If no improvement in dysphasia then outpatient upper endoscopy was recommended.  She tested negative for C. difficile and other bacteria/viral etiology.  She was discharged home on January 28, 2019 with prescriptions for oral Levaquin and Flagyl, and Percocet.  She was advised to follow-up with PCP once discharged.  Since her discharge home she is taking OTC Nexium 1-2 times daily. She has a history of dysphagia and hiatal hernia, endorsed difficulty with swallowing with food getting stuck in her throat during her recent hospital visit. Last endoscopy was in 2013. Last colonoscopy was in June 2017, several polyps with one being precancerous.  Due for repeat colonoscopy in summer 2020.  She is compliant to her antibiotics and has nearly completed the course. She's not had a normal bowel movement  since being home, denies diarrhea. She started Colace two days ago, is also eating a very bland and starchy diet.  2) Essential Hypertension: Currently managed on lisinopril 5 mg and metoprolol succinate 25 mg. She is checking her BP at home and is getting readings of 160's/90's. Home BP readings have been evaluated for the last several months. She denies headaches, dizziness, chest pain. History of LBBB diagnosed by cardiology.   BP Readings from Last 3 Encounters:  02/02/19 (!) 136/92  01/28/19 (!) 145/82  02/25/17 134/80    3) Migraines/Frequent Headaches: Currently managed on duloxetine 60 mg for which she's been taking for the last 3 years. She is needing a refill today. She is also managed on her naratriptan 2.5 mg for which she uses PRN, no recent use.   4) Hyperlipidemia/TIA: TIA 4-5 years ago. Currently managed on atorvastatin 10 mg.  No recent lipid panel on file.  5) Hypothyroidism: Currently managed on levothyroxine 75 mcg. She takes her levothyroxine in the morning with all of her medications and will eat soon after. She will also take her Nexium at the same.  She denies increased fatigue, hair loss, palpitations.  Review of Systems  Constitutional: Negative for fever.  Respiratory: Negative for shortness of breath.   Cardiovascular: Negative for chest pain and palpitations.  Gastrointestinal: Positive for constipation. Negative for abdominal pain, blood in stool, diarrhea and nausea.  Neurological:       Denies recent migraine.        Past Medical  History:  Diagnosis Date  . Allergy    seasonal  . Arthritis   . Bursitis of hip    Left  . Constipation    90%of the time c/o constipation, occ has diarrhea - uses stool softener   . Endometriosis   . Fibroid   . GERD (gastroesophageal reflux disease)   . Hiatal hernia   . HSV-1 (herpes simplex virus 1) infection   . Hyperlipidemia   . Hyperplastic colon polyp   . Hypertension   . Hypothyroidism   . Low back pain     . Migraine   . Osteopenia 2008   -1.2 FEMORAL NECK  . TIA (transient ischemic attack) 08-10-2013     Social History   Socioeconomic History  . Marital status: Divorced    Spouse name: Not on file  . Number of children: 2  . Years of education: Not on file  . Highest education level: Not on file  Occupational History    Employer: RETIRED  Social Needs  . Financial resource strain: Not hard at all  . Food insecurity:    Worry: Never true    Inability: Never true  . Transportation needs:    Medical: No    Non-medical: No  Tobacco Use  . Smoking status: Never Smoker  . Smokeless tobacco: Never Used  Substance and Sexual Activity  . Alcohol use: Yes    Alcohol/week: 0.0 standard drinks    Comment: 1 every 3 months  . Drug use: No  . Sexual activity: Not on file  Lifestyle  . Physical activity:    Days per week: Patient refused    Minutes per session: Patient refused  . Stress: Not at all  Relationships  . Social connections:    Talks on phone: Not on file    Gets together: Not on file    Attends religious service: Not on file    Active member of club or organization: Not on file    Attends meetings of clubs or organizations: Not on file    Relationship status: Not on file  . Intimate partner violence:    Fear of current or ex partner: Not on file    Emotionally abused: Not on file    Physically abused: Not on file    Forced sexual activity: Not on file  Other Topics Concern  . Not on file  Social History Narrative   Single.   2 daughters, 4 grandchildren.   Once worked for [employer].   Enjoys Ambulance person.     Past Surgical History:  Procedure Laterality Date  . ABDOMINAL HYSTERECTOMY  1989   TAH, [place]  2003  . COLONOSCOPY    . KNEE SURGERY  2004   right  . NASAL SEPTUM SURGERY  2012  . PITUITARY SURGERY  2001  . POLYPECTOMY    . SHOULDER SURGERY  2007   right  . THYROID LOBECTOMY  2002    Family  History  Problem Relation Age of Onset  . Heart disease Father   . Cancer Mother        brain tumor  . Breast cancer Maternal Aunt   . Parkinson's disease Sister   . Colon cancer Neg Hx   . Colon polyps Neg Hx   . Rectal cancer Neg Hx   . Stomach cancer Neg Hx   . Thyroid disease Neg Hx   . Esophageal cancer Neg Hx     Allergies  Allergen Reactions  . Tramadol Other (See Comments)    Reaction: NIGHTMARES    Current Outpatient Medications on File Prior to Visit  Medication Sig Dispense Refill  . aspirin 81 MG tablet Take 81 mg by mouth daily.    Marland Kitchen atorvastatin (LIPITOR) 10 MG tablet Take 1 tablet (10 mg total) by mouth daily. NEED APPOINTMENT FOR ANY MORE REFILLS 30 tablet 0  . cetirizine (ZYRTEC) 10 MG tablet Take 10 mg by mouth as needed for allergies. Reported on 05/28/2016    . cholecalciferol (VITAMIN D) 1000 UNITS tablet Take 1,000 Units by mouth daily. Reported on 05/28/2016    . diclofenac sodium (VOLTAREN) 1 % GEL Apply 1 g topically 4 (four) times daily as needed for pain.    Marland Kitchen esomeprazole (NEXIUM) 20 MG capsule Take 20 mg by mouth daily at 12 noon.    Marland Kitchen levothyroxine (SYNTHROID, LEVOTHROID) 75 MCG tablet Take 1 tablet (75 mcg total) by mouth daily before breakfast. NEED APPOINTMENT FOR ANY MORE REFILLS 30 tablet 0  . metoprolol succinate (TOPROL-XL) 25 MG 24 hr tablet Take 0.5 tablets (12.5 mg total) by mouth daily. NEED APPOINTMENT FOR ANY MORE REFILLS 30 tablet 0  . naratriptan (AMERGE) 2.5 MG tablet TAKE 1 TABLET BY MOUTH AS NEEDED FOR MIGRAINE. MAY REPEAT IN 4 HOURS IF SYMPTOMS PERSIST. MAX 2 TABLETS IN 24 HOURS 30 tablet 0   No current facility-administered medications on file prior to visit.     BP (!) 136/92 (BP Location: Right Arm, Patient Position: Sitting, Cuff Size: Normal)   Pulse 88   Temp 98.1 F (36.7 C) (Oral)   Ht 5\' 5"  (1.651 m)   Wt 198 lb 8 oz (90 kg)   PF 97 L/min   BMI 33.03 kg/m    Objective:   Physical Exam  Constitutional: She appears  well-nourished.  Neck: Neck supple.  Cardiovascular: Normal rate and regular rhythm.  Respiratory: Effort normal and breath sounds normal.  Skin: Skin is warm and dry.  Psychiatric: She has a normal mood and affect.           Assessment & Plan:

## 2019-02-02 NOTE — Assessment & Plan Note (Signed)
Above goal in the office today, also evidence of uncontrolled readings during recent hospital visit. Increase lisinopril to 10 mg, continue metoprolol succinate 25 mg. Follow-up in 2 to 3 weeks for blood pressure check.  She will bring home readings.

## 2019-02-02 NOTE — Assessment & Plan Note (Signed)
Infrequent, doing well on duloxetine 60 mg. Continue same, refill sent to pharmacy. No recent use of triptan.

## 2019-02-03 LAB — LIPID PANEL
Cholesterol: 78 mg/dL (ref 0–200)
HDL: 26.8 mg/dL — ABNORMAL LOW (ref >39.00)
LDL Cholesterol: 13 mg/dL (ref 0–99)
NonHDL: 51.46
Total CHOL/HDL Ratio: 3
Triglycerides: 190 mg/dL — ABNORMAL HIGH (ref 0.0–149.0)
VLDL: 38 mg/dL (ref 0.0–40.0)

## 2019-02-03 LAB — TSH: TSH: 4.12 u[IU]/mL (ref 0.35–4.50)

## 2019-02-03 LAB — HEMOGLOBIN A1C: Hgb A1c MFr Bld: 5.9 % (ref 4.6–6.5)

## 2019-02-05 ENCOUNTER — Inpatient Hospital Stay: Payer: Medicare Other | Admitting: Primary Care

## 2019-02-18 ENCOUNTER — Ambulatory Visit: Payer: Medicare Other | Admitting: Primary Care

## 2019-02-26 ENCOUNTER — Ambulatory Visit: Payer: Medicare Other | Admitting: Primary Care

## 2019-02-28 ENCOUNTER — Other Ambulatory Visit: Payer: Self-pay | Admitting: Primary Care

## 2019-02-28 DIAGNOSIS — I10 Essential (primary) hypertension: Secondary | ICD-10-CM

## 2019-03-03 ENCOUNTER — Telehealth: Payer: Self-pay

## 2019-03-03 DIAGNOSIS — E039 Hypothyroidism, unspecified: Secondary | ICD-10-CM

## 2019-03-03 DIAGNOSIS — I10 Essential (primary) hypertension: Secondary | ICD-10-CM

## 2019-03-03 DIAGNOSIS — G43901 Migraine, unspecified, not intractable, with status migrainosus: Secondary | ICD-10-CM

## 2019-03-03 DIAGNOSIS — R519 Headache, unspecified: Secondary | ICD-10-CM

## 2019-03-03 DIAGNOSIS — R51 Headache: Secondary | ICD-10-CM

## 2019-03-03 DIAGNOSIS — E785 Hyperlipidemia, unspecified: Secondary | ICD-10-CM

## 2019-03-03 NOTE — Telephone Encounter (Signed)
Needs office follow up visit for BP check and lab. Please schedule. Will send another 30 day supply but will need visit for additional refills.

## 2019-03-03 NOTE — Telephone Encounter (Signed)
Pt left v/m requesting cb about changing pharmacies to [facility] for refills on pts meds.Please advise.

## 2019-03-03 NOTE — Telephone Encounter (Signed)
Last prescribed on 02/02/2019. Last office visit on 02/02/2019. No future appointment

## 2019-03-05 MED ORDER — METOPROLOL SUCCINATE ER 25 MG PO TB24: 12.5000 mg | ORAL_TABLET | Freq: Every day | ORAL | 1 refills | Status: DC

## 2019-03-05 MED ORDER — ATORVASTATIN CALCIUM 10 MG PO TABS: 10.0000 mg | ORAL_TABLET | Freq: Every day | ORAL | 1 refills | Status: DC

## 2019-03-05 MED ORDER — LEVOTHYROXINE SODIUM 75 MCG PO TABS: 75.0000 ug | ORAL_TABLET | Freq: Every day | ORAL | 1 refills | Status: DC

## 2019-03-05 MED ORDER — DULOXETINE HCL 60 MG PO CPEP: ORAL_CAPSULE | ORAL | 1 refills | Status: DC

## 2019-03-05 NOTE — Telephone Encounter (Signed)
Refill as requested 

## 2019-03-09 ENCOUNTER — Other Ambulatory Visit: Payer: Self-pay

## 2019-03-09 ENCOUNTER — Ambulatory Visit (INDEPENDENT_AMBULATORY_CARE_PROVIDER_SITE_OTHER): Payer: Medicare Other | Admitting: Primary Care

## 2019-03-09 DIAGNOSIS — I10 Essential (primary) hypertension: Secondary | ICD-10-CM

## 2019-03-09 MED ORDER — LISINOPRIL 10 MG PO TABS: ORAL_TABLET | ORAL | 0 refills | Status: DC

## 2019-03-09 NOTE — Patient Instructions (Addendum)
Continue taking lisinopril 10 mg daily and metoprolol succinate 25 mg daily for blood pressure.  Please send me your blood pressure readings through the MyChart portal.  Please notify me if you start to see consistent readings at or above 140/90.  Please schedule a follow-up visit with me for blood pressure check in June 2020 as discussed.  It was nice speaking with you!  [name], NP-C

## 2019-03-09 NOTE — Progress Notes (Signed)
Subjective:    Patient ID: Denise [name], female    DOB: 10/09/1951, 68 y.o.   MRN: [id number]  HPI  Denise [name] is a 68 year old female who presents today via phone for follow up of hypertension. She consented to treatment for the phone visit. She is at home, I am in the office.  Called patient at 1:35 pm, no answer voicemail left for patient to return call. Called patient at 2 pm, no answer.  Called patient at 4:39 pm and she answered.   She was last evaluated in the office on February 02, 2019 for hospital follow-up.  During that visit her blood pressure was noted to be above goal, she also had history of elevated readings during prior hospital visit.  Given elevated readings we increased her lisinopril to 10 mg and continued her metoprolol succinate at 25 mg.  We advise for her to follow back up in the office within a couple of weeks for reevaluation.  Since her last visit she's checked her BP infrequently at home and has been getting readings of 130's/80's. She denies dizziness, chest pain, shortness of breath.   Review of Systems  Eyes: Negative for visual disturbance.  Respiratory: Negative for shortness of breath.   Cardiovascular: Negative for chest pain.  Neurological: Negative for dizziness and headaches.       Past Medical History:  Diagnosis Date  . Allergy    seasonal  . Arthritis   . Bursitis of hip    Left  . Constipation    90%of the time c/o constipation, occ has diarrhea - uses stool softener   . Endometriosis   . Facial flushing 05/12/2014  . Fibroid   . GERD (gastroesophageal reflux disease)   . Hematochezia   . Hiatal hernia   . HSV-1 (herpes simplex virus 1) infection   . Hyperlipidemia   . Hyperplastic colon polyp   . Hypertension   . Hypothyroidism   . Low back pain   . Migraine   . Osteopenia 2008   -1.2 FEMORAL NECK  . TIA (transient ischemic attack) 08-10-2013     Social History   Socioeconomic History  . Marital status: Divorced     Spouse name: Not on file  . Number of children: 2  . Years of education: Not on file  . Highest education level: Not on file  Occupational History    Employer: RETIRED  Social Needs  . Financial resource strain: Not hard at all  . Food insecurity:    Worry: Never true    Inability: Never true  . Transportation needs:    Medical: No    Non-medical: No  Tobacco Use  . Smoking status: Never Smoker  . Smokeless tobacco: Never Used  Substance and Sexual Activity  . Alcohol use: Yes    Alcohol/week: 0.0 standard drinks    Comment: 1 every 3 months  . Drug use: No  . Sexual activity: Not on file  Lifestyle  . Physical activity:    Days per week: Patient refused    Minutes per session: Patient refused  . Stress: Not at all  Relationships  . Social connections:    Talks on phone: Not on file    Gets together: Not on file    Attends religious service: Not on file    Active member of club or organization: Not on file    Attends meetings of clubs or organizations: Not on file    Relationship status: Not on  file  . Intimate partner violence:    Fear of current or ex partner: Not on file    Emotionally abused: Not on file    Physically abused: Not on file    Forced sexual activity: Not on file  Other Topics Concern  . Not on file  Social History Narrative   Single.   2 daughters, 4 grandchildren.   Once worked for [employer].   Enjoys Ambulance person.     Past Surgical History:  Procedure Laterality Date  . ABDOMINAL HYSTERECTOMY  1989   TAH, [place]  2003  . COLONOSCOPY    . KNEE SURGERY  2004   right  . NASAL SEPTUM SURGERY  2012  . PITUITARY SURGERY  2001  . POLYPECTOMY    . SHOULDER SURGERY  2007   right  . THYROID LOBECTOMY  2002    Family History  Problem Relation Age of Onset  . Heart disease Father   . Cancer Mother        brain tumor  . Breast cancer Maternal Aunt   . Parkinson's disease Sister   . Colon  cancer Neg Hx   . Colon polyps Neg Hx   . Rectal cancer Neg Hx   . Stomach cancer Neg Hx   . Thyroid disease Neg Hx   . Esophageal cancer Neg Hx     Allergies  Allergen Reactions  . Tramadol Other (See Comments)    Reaction: NIGHTMARES    Current Outpatient Medications on File Prior to Visit  Medication Sig Dispense Refill  . aspirin 81 MG tablet Take 81 mg by mouth daily.    Marland Kitchen atorvastatin (LIPITOR) 10 MG tablet Take 1 tablet (10 mg total) by mouth daily. 90 tablet 1  . cetirizine (ZYRTEC) 10 MG tablet Take 10 mg by mouth as needed for allergies. Reported on 05/28/2016    . cholecalciferol (VITAMIN D) 1000 UNITS tablet Take 1,000 Units by mouth daily. Reported on 05/28/2016    . diclofenac sodium (VOLTAREN) 1 % GEL Apply 1 g topically 4 (four) times daily as needed for pain.    . DULoxetine (CYMBALTA) 60 MG capsule TAKE 1 CAPSULE BY MOUTH EVERY DAY for headache prevention. 90 capsule 1  . esomeprazole (NEXIUM) 20 MG capsule Take 20 mg by mouth daily at 12 noon.    Marland Kitchen levothyroxine (SYNTHROID, LEVOTHROID) 75 MCG tablet Take 1 tablet (75 mcg total) by mouth daily before breakfast. 90 tablet 1  . metoprolol succinate (TOPROL-XL) 25 MG 24 hr tablet Take 0.5 tablets (12.5 mg total) by mouth daily. 90 tablet 1  . naratriptan (AMERGE) 2.5 MG tablet TAKE 1 TABLET BY MOUTH AS NEEDED FOR MIGRAINE. MAY REPEAT IN 4 HOURS IF SYMPTOMS PERSIST. MAX 2 TABLETS IN 24 HOURS 30 tablet 0   No current facility-administered medications on file prior to visit.     There were no vitals taken for this visit.   Objective:   Physical Exam  Constitutional: She is oriented to person, place, and time.  Respiratory: Effort normal.  Speaking in complete sentences   Neurological: She is alert and oriented to person, place, and time.  Psychiatric: She has a normal mood and affect.           Assessment & Plan:

## 2019-03-09 NOTE — Assessment & Plan Note (Addendum)
Improved based off of home readings. Will continue lisinopril 10 mg now, refill sent to pharmacy.  We will we will also plan to see her back in the office in June 2020 for follow-up of blood pressure.  Phone call lasted 12 minutes and 12 seconds.

## 2019-03-18 DIAGNOSIS — I10 Essential (primary) hypertension: Secondary | ICD-10-CM

## 2019-03-19 MED ORDER — METOPROLOL SUCCINATE ER 25 MG PO TB24: 25.0000 mg | ORAL_TABLET | Freq: Every day | ORAL | 1 refills | Status: DC

## 2019-03-23 DIAGNOSIS — I10 Essential (primary) hypertension: Secondary | ICD-10-CM

## 2019-03-23 MED ORDER — METOPROLOL SUCCINATE ER 25 MG PO TB24: 25.0000 mg | ORAL_TABLET | Freq: Every day | ORAL | 1 refills | Status: DC

## 2019-03-30 DIAGNOSIS — E039 Hypothyroidism, unspecified: Secondary | ICD-10-CM

## 2019-03-31 MED ORDER — LEVOTHYROXINE SODIUM 75 MCG PO TABS: ORAL_TABLET | ORAL | 1 refills | Status: DC

## 2019-03-31 NOTE — Telephone Encounter (Signed)
Pt left v/m; pt request cb about my chart message sent on 03/30/19; pt also said thyroid med sent in as name brand and cost to pt is $115.00. pt also wants to discuss current address and phone info.

## 2019-03-31 NOTE — Telephone Encounter (Signed)
Spoken to patient and confirm that we had he current address when send the refill back in March.   Resent the Rx as requested.

## 2019-05-21 ENCOUNTER — Other Ambulatory Visit: Payer: Self-pay | Admitting: Primary Care

## 2019-05-21 DIAGNOSIS — I10 Essential (primary) hypertension: Secondary | ICD-10-CM

## 2019-06-01 ENCOUNTER — Other Ambulatory Visit: Payer: Self-pay

## 2019-06-02 ENCOUNTER — Ambulatory Visit (INDEPENDENT_AMBULATORY_CARE_PROVIDER_SITE_OTHER): Payer: Medicare Other | Admitting: Women's Health

## 2019-06-02 ENCOUNTER — Encounter: Payer: Self-pay | Admitting: Women's Health

## 2019-06-02 VITALS — BP 134/80 | Ht 65.0 in | Wt 209.0 lb

## 2019-06-02 DIAGNOSIS — E2839 Other primary ovarian failure: Secondary | ICD-10-CM | POA: Diagnosis not present

## 2019-06-02 DIAGNOSIS — Z13820 Encounter for screening for osteoporosis: Secondary | ICD-10-CM | POA: Diagnosis not present

## 2019-06-02 DIAGNOSIS — Z01419 Encounter for gynecological examination (general) (routine) without abnormal findings: Secondary | ICD-10-CM | POA: Diagnosis not present

## 2019-06-02 NOTE — Patient Instructions (Addendum)
[facility] center  [phone] shingrex vaccine Vit d3 2000 iu daily   Health Maintenance After Age 68 After age 31, you are at a higher risk for certain long-term diseases and infections as well as injuries from falls. Falls are a major cause of broken bones and head injuries in people who are older than age 45. Getting regular preventive care can help to keep you healthy and well. Preventive care includes getting regular testing and making lifestyle changes as recommended by your health care provider. Talk with your health care provider about:  Which screenings and tests you should have. A screening is a test that checks for a disease when you have no symptoms.  A diet and exercise plan that is right for you. What should I know about screenings and tests to prevent falls? Screening and testing are the best ways to find a health problem early. Early diagnosis and treatment give you the best chance of managing medical conditions that are common after age 35. Certain conditions and lifestyle choices may make you more likely to have a fall. Your health care provider may recommend:  Regular vision checks. Poor vision and conditions such as cataracts can make you more likely to have a fall. If you wear glasses, make sure to get your prescription updated if your vision changes.  Medicine review. Work with your health care provider to regularly review all of the medicines you are taking, including over-the-counter medicines. Ask your health care provider about any side effects that may make you more likely to have a fall. Tell your health care provider if any medicines that you take make you feel dizzy or sleepy.  Osteoporosis screening. Osteoporosis is a condition that causes the bones to get weaker. This can make the bones weak and cause them to break more easily.  Blood pressure screening. Blood pressure changes and medicines to control blood pressure can make you feel dizzy.  Strength and balance  checks. Your health care provider may recommend certain tests to check your strength and balance while standing, walking, or changing positions.  Foot health exam. Foot pain and numbness, as well as not wearing proper footwear, can make you more likely to have a fall.  Depression screening. You may be more likely to have a fall if you have a fear of falling, feel emotionally low, or feel unable to do activities that you used to do.  Alcohol use screening. Using too much alcohol can affect your balance and may make you more likely to have a fall. What actions can I take to lower my risk of falls? General instructions  Talk with your health care provider about your risks for falling. Tell your health care provider if: ? You fall. Be sure to tell your health care provider about all falls, even ones that seem minor. ? You feel dizzy, sleepy, or off-balance.  Take over-the-counter and prescription medicines only as told by your health care provider. These include any supplements.  Eat a healthy diet and maintain a healthy weight. A healthy diet includes low-fat dairy products, low-fat (lean) meats, and fiber from whole grains, beans, and lots of fruits and vegetables. Home safety  Remove any tripping hazards, such as rugs, cords, and clutter.  Install safety equipment such as grab bars in bathrooms and safety rails on stairs.  Keep rooms and walkways well-lit. Activity   Follow a regular exercise program to stay fit. This will help you maintain your balance. Ask your health care provider what types  of exercise are appropriate for you.  If you need a cane or walker, use it as recommended by your health care provider.  Wear supportive shoes that have nonskid soles. Lifestyle  Do not drink alcohol if your health care provider tells you not to drink.  If you drink alcohol, limit how much you have: ? 0-1 drink a day for women. ? 0-2 drinks a day for men.  Be aware of how much alcohol is  in your drink. In the [place] one drink equals one typical bottle of beer (12 oz), one-half glass of wine (5 oz), or one shot of hard liquor (1 oz).  Do not use any products that contain nicotine or tobacco, such as cigarettes and e-cigarettes. If you need help quitting, ask your health care provider. Summary  Having a healthy lifestyle and getting preventive care can help to protect your health and wellness after age 66.  Screening and testing are the best way to find a health problem early and help you avoid having a fall. Early diagnosis and treatment give you the best chance for managing medical conditions that are more common for people who are older than age 81.  Falls are a major cause of broken bones and head injuries in people who are older than age 84. Take precautions to prevent a fall at home.  Work with your health care provider to learn what changes you can make to improve your health and wellness and to prevent falls. This information is not intended to replace advice given to you by your health care provider. Make sure you discuss any questions you have with your health care provider. Document Released: 10/16/2017 Document Revised: 10/16/2017 Document Reviewed: 10/16/2017 Elsevier Interactive Patient Education  2019 [employer].

## 2019-06-02 NOTE — Progress Notes (Signed)
Denise [name] 03-19-1951 [id number]    History:    Presents for breast and pelvic exam.  1989 TAH and BSO on no HRT.  Normal Pap and mammogram history, last mammogram 2015.  Biggest complaint is urge incontinence, wearing a pad daily.  Has had urinary studies done in the past which showed some over distention of bladder.  Primary care manages hypertension, hypercholesteremia and hypothyroidism.  History of a TIA.  2017- colonoscopy.  Not sexually active.  Past medical history, past surgical history, family history and social history were all reviewed and documented in the EPIC chart.  One daughter lives in [place], other daughter lives in [place] is a [place].  ROS:  A ROS was performed and pertinent positives and negatives are included.  Exam:  Vitals:   06/02/19 1559  BP: 134/80  Weight: 209 lb (94.8 kg)  Height: 5\' 5"  (1.651 m)   Body mass index is 34.78 kg/m.   General appearance:  Normal Thyroid:  Symmetrical, normal in size, without palpable masses or nodularity. Respiratory  Auscultation:  Clear without wheezing or rhonchi Cardiovascular  Auscultation:  Regular rate, without rubs, murmurs or gallops  Edema/varicosities:  Not grossly evident Abdominal  Soft,nontender, without masses, guarding or rebound.  Liver/spleen:  No organomegaly noted  Hernia:  None appreciated  Skin  Inspection:  Grossly normal   Breasts: Examined lying and sitting.     Right: Without masses, retractions, discharge or axillary adenopathy.     Left: Slight tenderness at 2 o'clock position, dense tissue, without masses, retractions, discharge or axillary adenopathy. Gentitourinary   Inguinal/mons:  Normal without inguinal adenopathy  External genitalia:  Normal  BUS/Urethra/Skene's glands:  Normal  Vagina:  Normal  Cervix: And uterus absent adnexa/parametria:     Rt: Without masses or tenderness.   Lt: Without masses or tenderness.  Anus and perineum: Normal  Digital rectal exam: Normal  sphincter tone without palpated masses or tenderness  Assessment/Plan:  68 y.o. D WF G2 P2 for breast and pelvic exam.  Left breast tenderness at 2 o'clock position/dense tissue 1989 TAH with BSO on no HRT Urge incontinence Hypertension, hypercholesteremia, hypothyroidism, anxiety/depression-primary care manages labs and meds Obesity  Plan: Reviewed bladder irritants, need to decrease abdominal weight may also help, emptying bladder and routine regular times encouraged.  SBEs, reviewed importance of annual screening mammogram will schedule left diagnostic mammogram with ultrasound due to tenderness and questionable increased left breast density.  DEXA, will schedule, reviewed importance of weightbearing and balance type exercise, yoga encouraged.  Home safety and fall prevention discussed.  Decrease calorie/carbs encouraged.    [place], 4:46 PM 06/02/2019

## 2019-06-03 ENCOUNTER — Telehealth: Payer: Self-pay | Admitting: *Deleted

## 2019-06-03 DIAGNOSIS — N644 Mastodynia: Secondary | ICD-10-CM

## 2019-06-03 NOTE — Telephone Encounter (Signed)
-----   Message from Huel Cote, NP sent at 06/02/2019  4:25 PM EDT ----- Left breast tenderness for years off and on, has increased mostly tender at 2 :00v last mammo 2015, MA breast ca. No palpable mass, tissue is dense.  Can go anytime.  Diag mammo and [place]

## 2019-06-03 NOTE — Telephone Encounter (Signed)
Patient scheduled at breast center on 06/10/19 @8 :50am, patient informed.

## 2019-06-04 ENCOUNTER — Encounter: Payer: Self-pay | Admitting: Gastroenterology

## 2019-06-04 LAB — URINE CULTURE
MICRO NUMBER:: 578914
SPECIMEN QUALITY:: ADEQUATE

## 2019-06-04 LAB — URINALYSIS, COMPLETE W/RFL CULTURE
Bacteria, UA: NONE SEEN /HPF
Bilirubin Urine: NEGATIVE
Crystals: NONE SEEN /HPF
Glucose, UA: NEGATIVE
Hgb urine dipstick: NEGATIVE
Hyaline Cast: NONE SEEN /LPF
Ketones, ur: NEGATIVE
Leukocyte Esterase: NEGATIVE
Nitrites, Initial: NEGATIVE
Protein, ur: NEGATIVE
RBC / HPF: NONE SEEN /HPF (ref 0–2)
Specific Gravity, Urine: 1.007 (ref 1.001–1.03)
pH: 6 (ref 5.0–8.0)

## 2019-06-04 LAB — CULTURE INDICATED

## 2019-06-10 ENCOUNTER — Ambulatory Visit
Admission: RE | Admit: 2019-06-10 | Discharge: 2019-06-10 | Disposition: A | Discharge status: Home / Self Care | Payer: Medicare Other | Source: Ambulatory Visit | Attending: Women's Health | Admitting: Women's Health

## 2019-06-10 ENCOUNTER — Other Ambulatory Visit: Payer: Self-pay

## 2019-06-10 DIAGNOSIS — N644 Mastodynia: Secondary | ICD-10-CM

## 2019-08-07 ENCOUNTER — Other Ambulatory Visit: Payer: Self-pay

## 2019-08-07 NOTE — Patient Outreach (Signed)
[facility]) Care Management  08/07/2019  Aavya Verrastro [facility] 12-Nov-1951 [id number]   Medication Adherence call to Mrs. Denise [name] HIPPA Compliant Voice message left with a call back number. [name] is showing past due on Atorvastatin 10 mg and Lisinopril 10 mg under [place].  [place] Management Direct Dial [phone]  Fax [phone] [email or web address]

## 2019-08-28 ENCOUNTER — Telehealth: Payer: Self-pay | Admitting: Primary Care

## 2019-08-28 ENCOUNTER — Other Ambulatory Visit: Payer: Self-pay | Admitting: Family Medicine

## 2019-08-28 DIAGNOSIS — R519 Headache, unspecified: Secondary | ICD-10-CM

## 2019-08-28 DIAGNOSIS — G43901 Migraine, unspecified, not intractable, with status migrainosus: Secondary | ICD-10-CM

## 2019-08-28 MED ORDER — NARATRIPTAN HCL 2.5 MG PO TABS: ORAL_TABLET | ORAL | 0 refills | Status: DC

## 2019-08-28 NOTE — Telephone Encounter (Signed)
Left detailed message on patient's voice mail.

## 2019-08-28 NOTE — Telephone Encounter (Signed)
Please call her and let her know it has been sent to her pharmacy.

## 2019-08-28 NOTE — Telephone Encounter (Signed)
Last filled on 11/13/2016. LOV with Anda Kraft was on 03/09/2019. No future appointments made

## 2019-08-28 NOTE — Telephone Encounter (Signed)
Patient is requesting a prescription refill for Naratripan   She stated that she is having migraines still and would like this sent as soon as possible    [facility] [place]

## 2019-10-03 ENCOUNTER — Other Ambulatory Visit: Payer: Self-pay | Admitting: Primary Care

## 2019-10-03 DIAGNOSIS — E039 Hypothyroidism, unspecified: Secondary | ICD-10-CM

## 2019-10-03 DIAGNOSIS — E785 Hyperlipidemia, unspecified: Secondary | ICD-10-CM

## 2019-10-03 DIAGNOSIS — R519 Headache, unspecified: Secondary | ICD-10-CM

## 2019-10-03 DIAGNOSIS — G43901 Migraine, unspecified, not intractable, with status migrainosus: Secondary | ICD-10-CM

## 2019-10-03 DIAGNOSIS — I10 Essential (primary) hypertension: Secondary | ICD-10-CM

## 2019-10-12 ENCOUNTER — Telehealth: Payer: Self-pay

## 2019-10-12 NOTE — Telephone Encounter (Signed)
Pt said she has been under stress and for couple of months pts BP has been elevated. Pt taking BP meds as prescribed and has not missed taking.earlier today BP 172/117 after rest recked x two 168/97 and now 157/98 P 106. Pt said lightheadedness is better than earlier this morning and slight dull h/a but pt has hx of migraines.No CP or SOB. No covid symptoms except slight dull h/a and no travel and no known exposure to covid. Is it OK for pt to come in with slight,dull h/a and not other covid symtoms? If pt does not here back from office pt will be at [facility] on 10/13/19 at 10:40 AM. ED precautions given.

## 2019-10-12 NOTE — Telephone Encounter (Signed)
Okay to see patient in the office as scheduled.

## 2019-10-13 ENCOUNTER — Other Ambulatory Visit: Payer: Self-pay

## 2019-10-13 ENCOUNTER — Encounter: Payer: Self-pay | Admitting: Primary Care

## 2019-10-13 ENCOUNTER — Ambulatory Visit (INDEPENDENT_AMBULATORY_CARE_PROVIDER_SITE_OTHER): Payer: Medicare Other | Admitting: Primary Care

## 2019-10-13 VITALS — BP 148/90 | HR 82 | Temp 97.2°F | Ht 65.0 in | Wt 207.8 lb

## 2019-10-13 DIAGNOSIS — R7303 Prediabetes: Secondary | ICD-10-CM | POA: Insufficient documentation

## 2019-10-13 DIAGNOSIS — R61 Generalized hyperhidrosis: Secondary | ICD-10-CM

## 2019-10-13 DIAGNOSIS — I10 Essential (primary) hypertension: Secondary | ICD-10-CM | POA: Diagnosis not present

## 2019-10-13 DIAGNOSIS — Z23 Encounter for immunization: Secondary | ICD-10-CM

## 2019-10-13 DIAGNOSIS — E890 Postprocedural hypothyroidism: Secondary | ICD-10-CM

## 2019-10-13 LAB — COMPREHENSIVE METABOLIC PANEL
ALT: 11 U/L (ref 0–35)
AST: 15 U/L (ref 0–37)
Albumin: 4.1 g/dL (ref 3.5–5.2)
Alkaline Phosphatase: 227 U/L — ABNORMAL HIGH (ref 39–117)
BUN: 12 mg/dL (ref 6–23)
CO2: 30 mEq/L (ref 19–32)
Calcium: 9.1 mg/dL (ref 8.4–10.5)
Chloride: 106 mEq/L (ref 96–112)
Creatinine, Ser: 0.63 mg/dL (ref 0.40–1.20)
GFR: 93.9 mL/min (ref >60.00)
Glucose, Bld: 85 mg/dL (ref 70–99)
Potassium: 4.6 mEq/L (ref 3.5–5.1)
Sodium: 141 mEq/L (ref 135–145)
Total Bilirubin: 0.3 mg/dL (ref 0.2–1.2)
Total Protein: 7.1 g/dL (ref 6.0–8.3)

## 2019-10-13 LAB — CBC
HCT: 40 % (ref 36.0–46.0)
Hemoglobin: 13.2 g/dL (ref 12.0–15.0)
MCHC: 32.9 g/dL (ref 30.0–36.0)
MCV: 81.8 fl (ref 78.0–100.0)
Platelets: 314 10*3/uL (ref 150.0–400.0)
RBC: 4.89 Mil/uL (ref 3.87–5.11)
RDW: 14.3 % (ref 11.5–15.5)
WBC: 7.3 10*3/uL (ref 4.0–10.5)

## 2019-10-13 LAB — HEMOGLOBIN A1C: Hgb A1c MFr Bld: 6 % (ref 4.6–6.5)

## 2019-10-13 MED ORDER — LISINOPRIL 20 MG PO TABS: 20.0000 mg | ORAL_TABLET | Freq: Every day | ORAL | 0 refills | Status: DC

## 2019-10-13 NOTE — Addendum Note (Signed)
Addended by: Jacqualin Combes on: 10/13/2019 11:19 AM   Modules accepted: Orders

## 2019-10-13 NOTE — Assessment & Plan Note (Signed)
A1C of 5.9 on prior labs from early 2020, repeat A1C pending.

## 2019-10-13 NOTE — Assessment & Plan Note (Signed)
Chronic for the last year, symptoms do represent hyperhidrosis but she is older for a new diagnosis.  Check labs today to rule out metabolic cause including [id number], CBC, CMP, TSH.  We will also work to gain better BP control as this could be contributing.   Follow up in 2 weeks.

## 2019-10-13 NOTE — Progress Notes (Signed)
Subjective:    Patient ID: Denise [name], female    DOB: 02/28/1951, 68 y.o.   MRN: [id number]  HPI  Denise [name] is a 68 year old female with a history of hypertension, migraine, hypothyroidism, pituitary adenoma who presents today with a chief complaint of elevated blood pressure and excessive perspiration.   1) Essential Hypertension:   She called into our office yesterday with reports of elevated blood pressure readings over the last several months, also with lightheadedness and dull headache. Given these symptoms and elevated readings she was asked to come in today.  Blood pressure readings at home yesterday were running 172/117, 168/97, 157/98. She is managed on lisinopril 10 mg and metoprolol succinate 25 mg. Elevated blood pressure readings intermittently since February 2020, last evaluated for this in March 2020 via phone. At the time she endorsed BP readings of 130's/80's.  BP Readings from Last 3 Encounters:  10/13/19 (!) 148/90  06/02/19 134/80  02/02/19 (!) 136/92   She endorses compliance to her lisinopril and metoprolol. 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 Her sister was recently moved to assisted living.  2) Excessive Perspiration: Chronic for the last year. She would like to discuss sweating. She was once managed on oral hormones for hot flashes post menopause, stopped around five years ago after her TIA. Hysterectomy in her 11's. Symptoms begin with a heat sensation to her scalp, thenbegins sweating profusely down her face, also with sweating of her hands and feet as well as her body. She will be drenched in sweat at times. She's discussed these symptoms with her GYN and endocrinologist and was told that they are unsure of the cause. These symptoms do not feel like typical hot flashes. She does not sweat at night.  She is taking her levothyroxine every morning with her other medications, she is eating  shortly after. She is not taking PPI's, vitamins, calcium within 4 hours.   Review of Systems  Eyes: Negative for visual disturbance.  Respiratory: Negative for shortness of breath.   Cardiovascular: Negative for chest pain.  Skin:       Excessive sweating.  Neurological: Positive for light-headedness and headaches.       Past Medical History:  Diagnosis Date  . Allergy    seasonal  . Arthritis   . Bursitis of hip    Left  . Constipation    90%of the time c/o constipation, occ has diarrhea - uses stool softener   . Endometriosis   . Facial flushing 05/12/2014  . Fibroid   . GERD (gastroesophageal reflux disease)   . Hematochezia   . Hiatal hernia   . HSV-1 (herpes simplex virus 1) infection   . Hyperlipidemia   . Hyperplastic colon polyp   . Hypertension   . Hypothyroidism   . Low back pain   . Migraine   . Osteopenia 2008   -1.2 FEMORAL NECK  . TIA (transient ischemic attack) 08-10-2013     Social History   Socioeconomic History  . Marital status: Divorced    Spouse name: Not on file  . Number of children: 2  . Years of education: Not on file  . Highest education level: Not on file  Occupational History    Employer: RETIRED  Social Needs  . Financial resource strain: Not hard at all  . Food insecurity    Worry: Never true    Inability: Never true  . Transportation  needs    Medical: No    Non-medical: No  Tobacco Use  . Smoking status: Never Smoker  . Smokeless tobacco: Never Used  Substance and Sexual Activity  . Alcohol use: Yes    Alcohol/week: 0.0 standard drinks    Comment: 1 every 3 months  . Drug use: No  . Sexual activity: Not Currently    Comment: intercourse age 47, more than 5 sexual partners,des neg  Lifestyle  . Physical activity    Days per week: Patient refused    Minutes per session: Patient refused  . Stress: Not at all  Relationships  . Social Herbalist on phone: Not on file    Gets together: Not on file    Attends  religious service: Not on file    Active member of club or organization: Not on file    Attends meetings of clubs or organizations: Not on file    Relationship status: Not on file  . Intimate partner violence    Fear of current or ex partner: Not on file    Emotionally abused: Not on file    Physically abused: Not on file    Forced sexual activity: Not on file  Other Topics Concern  . Not on file  Social History Narrative   Single.   2 daughters, 4 grandchildren.   Once worked for [employer].   Enjoys Ambulance person.     Past Surgical History:  Procedure Laterality Date  . ABDOMINAL HYSTERECTOMY  1989   TAH, Dean  2003  . COLONOSCOPY    . KNEE SURGERY  2004   right  . NASAL SEPTUM SURGERY  2012  . PITUITARY SURGERY  2001  . POLYPECTOMY    . SHOULDER SURGERY  2007   right  . THYROID LOBECTOMY  2002    Family History  Problem Relation Age of Onset  . Heart disease Father   . Cancer Mother        brain tumor  . Breast cancer Maternal Aunt   . Parkinson's disease Sister   . Colon cancer Neg Hx   . Colon polyps Neg Hx   . Rectal cancer Neg Hx   . Stomach cancer Neg Hx   . Thyroid disease Neg Hx   . Esophageal cancer Neg Hx     Allergies  Allergen Reactions  . Tramadol Other (See Comments)    Reaction: NIGHTMARES    Current Outpatient Medications on File Prior to Visit  Medication Sig Dispense Refill  . aspirin 81 MG tablet Take 81 mg by mouth daily.    Marland Kitchen atorvastatin (LIPITOR) 10 MG tablet TAKE 1 TABLET BY MOUTH  DAILY 90 tablet 1  . cetirizine (ZYRTEC) 10 MG tablet Take 10 mg by mouth as needed for allergies. Reported on 05/28/2016    . diclofenac sodium (VOLTAREN) 1 % GEL Apply 1 g topically 4 (four) times daily as needed for pain.    . DULoxetine (CYMBALTA) 60 MG capsule TAKE 1 CAPSULE BY MOUTH  EVERY DAY FOR HEADACHE  PREVENTION. 90 capsule 0  . esomeprazole (NEXIUM) 20 MG capsule Take 20 mg by mouth daily at 12  noon.    Marland Kitchen levothyroxine (SYNTHROID) 75 MCG tablet TAKE 1 TABLET BY MOUTH  EVERY MORNING ON AN EMPTY  STOMACH WITH WATER ONLY (NO FOOD OR OTHER MEDICATIONS  FOR 30 MINUTES) 90 tablet 1  . metoprolol succinate (TOPROL-XL) 25 MG 24 hr tablet TAKE  1 TABLET BY MOUTH  DAILY 90 tablet 1  . naratriptan (AMERGE) 2.5 MG tablet TAKE 1 TABLET BY MOUTH AS NEEDED FOR MIGRAINE. MAY REPEAT IN 4 HOURS IF SYMPTOMS PERSIST. MAX 2 TABLETS IN 24 HOURS 20 tablet 0   No current facility-administered medications on file prior to visit.     BP (!) 148/90   Pulse 82   Temp (!) 97.2 F (36.2 C) (Temporal)   Ht 5\' 5"  (1.651 m)   Wt 207 lb 12 oz (94.2 kg)   SpO2 98%   BMI 34.57 kg/m    Objective:   Physical Exam  Constitutional: She appears well-nourished.  Neck: Neck supple.  Cardiovascular: Normal rate and regular rhythm.  Respiratory: Effort normal and breath sounds normal.  Skin: Skin is warm and dry.  Mild visible sweat to forehead   Psychiatric: She has a normal mood and affect.           Assessment & Plan:

## 2019-10-13 NOTE — Assessment & Plan Note (Signed)
Above goal in the office today, also during prior visits. Start with increase in lisinopril to 20 mg, continue metoprolol succinate.   We will have her monitor BP and follow up in 2-3 weeks for BP check. BMP pending.

## 2019-10-13 NOTE — Assessment & Plan Note (Signed)
Continues to take levothyroxine inappropriately, discussed proper administration including taking with water only, no food or other medications for 30 minutes.  Repeat TSH pending.

## 2019-10-13 NOTE — Patient Instructions (Signed)
Stop by the lab prior to leaving today. I will notify you of your results once received.   Be sure to take your levothyroxine (thyroid medication) every morning on an empty stomach with water only. No food or other medications for 30 minutes. No heartburn medication, iron pills, calcium, vitamin D, or magnesium pills within four hours of taking levothyroxine.   We've increased the dose of your lisinopril to 20 mg. I sent a new prescription to your pharmacy. Continue taking metoprolol succinate for blood pressure.  Schedule a follow up visit for 2 weeks for blood pressure check.  It was a pleasure to see you today!

## 2019-10-15 LAB — TSH: TSH: 1.94 u[IU]/mL (ref 0.35–4.50)

## 2019-10-20 ENCOUNTER — Encounter: Payer: Self-pay | Admitting: Gastroenterology

## 2019-10-22 ENCOUNTER — Encounter: Payer: Self-pay | Admitting: Primary Care

## 2019-10-22 ENCOUNTER — Ambulatory Visit (INDEPENDENT_AMBULATORY_CARE_PROVIDER_SITE_OTHER): Payer: Medicare Other | Admitting: Primary Care

## 2019-10-22 ENCOUNTER — Other Ambulatory Visit: Payer: Self-pay

## 2019-10-22 VITALS — BP 140/92 | HR 82 | Temp 97.9°F | Ht 65.0 in | Wt 207.2 lb

## 2019-10-22 DIAGNOSIS — R1032 Left lower quadrant pain: Secondary | ICD-10-CM

## 2019-10-22 LAB — CBC WITH DIFFERENTIAL/PLATELET
Basophils Absolute: 0.1 10*3/uL (ref 0.0–0.1)
Basophils Relative: 0.9 % (ref 0.0–3.0)
Eosinophils Absolute: 0.5 10*3/uL (ref 0.0–0.7)
Eosinophils Relative: 6.5 % — ABNORMAL HIGH (ref 0.0–5.0)
HCT: 40.5 % (ref 36.0–46.0)
Hemoglobin: 13.3 g/dL (ref 12.0–15.0)
Lymphocytes Relative: 25.2 % (ref 12.0–46.0)
Lymphs Abs: 1.8 10*3/uL (ref 0.7–4.0)
MCHC: 32.8 g/dL (ref 30.0–36.0)
MCV: 81.4 fl (ref 78.0–100.0)
Monocytes Absolute: 0.4 10*3/uL (ref 0.1–1.0)
Monocytes Relative: 5.7 % (ref 3.0–12.0)
Neutro Abs: 4.4 10*3/uL (ref 1.4–7.7)
Neutrophils Relative %: 61.7 % (ref 43.0–77.0)
Platelets: 281 10*3/uL (ref 150.0–400.0)
RBC: 4.97 Mil/uL (ref 3.87–5.11)
RDW: 14.2 % (ref 11.5–15.5)
WBC: 7.1 10*3/uL (ref 4.0–10.5)

## 2019-10-22 LAB — HEPATIC FUNCTION PANEL
ALT: 10 U/L (ref 0–35)
AST: 12 U/L (ref 0–37)
Albumin: 4.1 g/dL (ref 3.5–5.2)
Alkaline Phosphatase: 192 U/L — ABNORMAL HIGH (ref 39–117)
Bilirubin, Direct: 0.1 mg/dL (ref 0.0–0.3)
Total Bilirubin: 0.4 mg/dL (ref 0.2–1.2)
Total Protein: 6.8 g/dL (ref 6.0–8.3)

## 2019-10-22 MED ORDER — CIPROFLOXACIN HCL 500 MG PO TABS: 500.0000 mg | ORAL_TABLET | Freq: Two times a day (BID) | ORAL | 0 refills | Status: DC

## 2019-10-22 MED ORDER — METRONIDAZOLE 500 MG PO TABS: 500.0000 mg | ORAL_TABLET | Freq: Three times a day (TID) | ORAL | 0 refills | Status: DC

## 2019-10-22 NOTE — Progress Notes (Signed)
Subjective:    Patient ID: Denise [name], female    DOB: 08-07-1951, 68 y.o.   MRN: [id number]  HPI  Denise [name] is a 68 year old female with a history of ischemic colitis to descending colon,  diverticulosis to sigmoid colon GERD, hypothyroidism, cholecystectomy, prediabetes who presents today with a chief complaint of abdominal pain.   Her pain is located to the left lower abdomen which began about 3-4 days ago after returning from eating out with her husband.  History of ischemic colitis in February 2020, hospitalized. She describes the pain as constant with increased intensity at times. Her pain now feels similar to her pain in February 2020, not as bad.   She's also had 3-4 episodes of diarrhea, constipation over the last 2 days. She's had some nausea without vomiting, abdominal bloating and fatigue. She is eating less due to decreased appetite, is hydrating with water. She's not taken anything OTC for symptoms. She does have a colonoscopy scheduled for December 2020.  BP Readings from Last 3 Encounters:  10/22/19 (!) 140/92  10/13/19 (!) 148/90  06/02/19 134/80     Review of Systems  Constitutional: Negative for fever.  Cardiovascular: Negative for palpitations.  Gastrointestinal: Positive for abdominal pain, constipation, diarrhea and nausea. Negative for blood in stool and vomiting.       Past Medical History:  Diagnosis Date  . Allergy    seasonal  . Arthritis   . Bursitis of hip    Left  . Constipation    90%of the time c/o constipation, occ has diarrhea - uses stool softener   . Endometriosis   . Facial flushing 05/12/2014  . Fibroid   . GERD (gastroesophageal reflux disease)   . Hematochezia   . Hiatal hernia   . HSV-1 (herpes simplex virus 1) infection   . Hyperlipidemia   . Hyperplastic colon polyp   . Hypertension   . Hypothyroidism   . Low back pain   . Migraine   . Osteopenia 2008   -1.2 FEMORAL NECK  . TIA (transient ischemic attack)  08-10-2013     Social History   Socioeconomic History  . Marital status: Divorced    Spouse name: Not on file  . Number of children: 2  . Years of education: Not on file  . Highest education level: Not on file  Occupational History    Employer: RETIRED  Social Needs  . Financial resource strain: Not hard at all  . Food insecurity    Worry: Never true    Inability: Never true  . Transportation needs    Medical: No    Non-medical: No  Tobacco Use  . Smoking status: Never Smoker  . Smokeless tobacco: Never Used  Substance and Sexual Activity  . Alcohol use: Yes    Alcohol/week: 0.0 standard drinks    Comment: 1 every 3 months  . Drug use: No  . Sexual activity: Not Currently    Comment: intercourse age 16, more than 5 sexual partners,des neg  Lifestyle  . Physical activity    Days per week: Patient refused    Minutes per session: Patient refused  . Stress: Not at all  Relationships  . Social Herbalist on phone: Not on file    Gets together: Not on file    Attends religious service: Not on file    Active member of club or organization: Not on file    Attends meetings of clubs or organizations: Not  on file    Relationship status: Not on file  . Intimate partner violence    Fear of current or ex partner: Not on file    Emotionally abused: Not on file    Physically abused: Not on file    Forced sexual activity: Not on file  Other Topics Concern  . Not on file  Social History Narrative   Single.   2 daughters, 4 grandchildren.   Once worked for [employer].   Enjoys Ambulance person.     Past Surgical History:  Procedure Laterality Date  . ABDOMINAL HYSTERECTOMY  1989   TAH, [place]  2003  . COLONOSCOPY    . KNEE SURGERY  2004   right  . NASAL SEPTUM SURGERY  2012  . PITUITARY SURGERY  2001  . POLYPECTOMY    . SHOULDER SURGERY  2007   right  . THYROID LOBECTOMY  2002    Family History  Problem  Relation Age of Onset  . Heart disease Father   . Cancer Mother        brain tumor  . Breast cancer Maternal Aunt   . Parkinson's disease Sister   . Colon cancer Neg Hx   . Colon polyps Neg Hx   . Rectal cancer Neg Hx   . Stomach cancer Neg Hx   . Thyroid disease Neg Hx   . Esophageal cancer Neg Hx     Allergies  Allergen Reactions  . Tramadol Other (See Comments)    Reaction: NIGHTMARES    Current Outpatient Medications on File Prior to Visit  Medication Sig Dispense Refill  . aspirin 81 MG tablet Take 81 mg by mouth daily.    Marland Kitchen atorvastatin (LIPITOR) 10 MG tablet TAKE 1 TABLET BY MOUTH  DAILY 90 tablet 1  . cetirizine (ZYRTEC) 10 MG tablet Take 10 mg by mouth as needed for allergies. Reported on 05/28/2016    . diclofenac sodium (VOLTAREN) 1 % GEL Apply 1 g topically 4 (four) times daily as needed for pain.    . DULoxetine (CYMBALTA) 60 MG capsule TAKE 1 CAPSULE BY MOUTH  EVERY DAY FOR HEADACHE  PREVENTION. 90 capsule 0  . esomeprazole (NEXIUM) 20 MG capsule Take 20 mg by mouth daily at 12 noon.    Marland Kitchen levothyroxine (SYNTHROID) 75 MCG tablet TAKE 1 TABLET BY MOUTH  EVERY MORNING ON AN EMPTY  STOMACH WITH WATER ONLY (NO FOOD OR OTHER MEDICATIONS  FOR 30 MINUTES) 90 tablet 1  . lisinopril (ZESTRIL) 20 MG tablet Take 1 tablet (20 mg total) by mouth daily. For blood pressure. 90 tablet 0  . metoprolol succinate (TOPROL-XL) 25 MG 24 hr tablet TAKE 1 TABLET BY MOUTH  DAILY 90 tablet 1  . naratriptan (AMERGE) 2.5 MG tablet TAKE 1 TABLET BY MOUTH AS NEEDED FOR MIGRAINE. MAY REPEAT IN 4 HOURS IF SYMPTOMS PERSIST. MAX 2 TABLETS IN 24 HOURS 20 tablet 0   No current facility-administered medications on file prior to visit.     BP (!) 140/92   Pulse 82   Temp 97.9 F (36.6 C) (Temporal)   Ht 5\' 5"  (1.651 m)   Wt 207 lb 4 oz (94 kg)   SpO2 97%   BMI 34.49 kg/m    Objective:   Physical Exam  Constitutional: She appears well-nourished. She does not have a sickly appearance.  Neck:  Neck supple.  Cardiovascular: Normal rate and regular rhythm.  Respiratory: Effort normal and breath sounds  normal.  GI: Soft. Normal appearance and bowel sounds are normal. There is abdominal tenderness in the suprapubic area and left lower quadrant.    Skin: Skin is warm and dry.           Assessment & Plan:

## 2019-10-22 NOTE — Patient Instructions (Signed)
Start ciprofloxacin antibiotics for the infection. Take 1 tablet by mouth twice daily for 10 days.  Start metronidazole antibiotics for the infection. Take 1 tablet by mouth three times daily for 10 days.  Stop by the lab prior to leaving today. I will notify you of your results once received.   Please reschedule your visit for one week from today.  Go on a clear liquid diet for now.  It was a pleasure to see you today!   Clear Liquid Diet, Adult A clear liquid diet is a diet that includes only liquids and semi-liquids that you can see through. You do not eat any food on this diet. Most people need to follow this diet for only a short period of time. You may need to follow a clear liquid diet if:  You develop a medical condition right before or after you have surgery.  You were not able to eat food for a long period of time.  You had a condition that gave you nausea, vomiting, or diarrhea.  You are going to have a procedure or exam to look at parts of your digestive system.  You are going to have bowel surgery. The usual goals of this diet are:  To rest the stomach and digestive system as much as possible.  To help you clear the digestive system before a procedure or exam.  To keep you hydrated.  To make sure you get some calories for energy.  To help you return to your normal eating routine. What are tips for following this plan?  A clear liquid is a liquid or semi-liquid, such as gelatin, that you can see through when you hold it up to a light.  A clear liquid diet does not provide all the nutrients that you need. It is important to choose a variety of the liquids or semi-liquids that are allowed on this diet. That way, you will get as many nutrients as possible.  If you are not sure whether you can have certain items, ask your health care provider. If you have difficulty swallowing thin liquids, you will need to thicken them to prevent breathing in (aspiration). What  foods should I eat?   Water and flavored water.  Fruit juices that do not have pulp, such as cranberry juice and apple juice.  Tea and coffee without milk or cream.  Clear bouillon or broth.  Broth-based soups that have been strained.  Flavored gelatin.  Honey.  Sugar water.  Ice or frozen ice pops that do not contain milk, yogurt, fruit pieces, or fruit pulp.  Clear sodas.  Clear sports drinks. The items listed above may not be a complete list of recommended foods and beverages. Contact a dietitian for more information. What food should I avoid?  Juices that have pulp.  Milk.  Cream or cream-based soups.  Yogurt.  Regular foods that are not clear liquids or semi-liquids. The items listed above may not be a complete list of foods and beverages to avoid. Contact a dietitian for more information. Questions to ask your health care provider:  How long do I need to follow this diet?  Are there any medicines that I should change while on this diet? Summary  A clear liquid diet is a diet that includes liquids and semi-liquids that you can see through.  The goal of this diet is to help you recover by resting your digestive system, keeping you hydrated, and providing nutrients.  Avoid liquids with milk, cream, or pulp while  on this diet. This information is not intended to replace advice given to you by your health care provider. Make sure you discuss any questions you have with your health care provider. Document Released: 12/03/2005 Document Revised: 05/26/2018 Document Reviewed: 05/26/2018 Elsevier Patient Education  2020 [employer].

## 2019-10-22 NOTE — Assessment & Plan Note (Signed)
Acute for the last 3-4 days, feels like colitis pain from hospitalization in February 2020. She does have evidence of sigmoid diverticulosis on colonoscopy from 2017. Tenderness on exam.  Given symptoms coupled with exam and history, will treat for presumed diverticulitis. Labs pending. Colonoscopy scheduled for December 2020.

## 2019-10-27 ENCOUNTER — Ambulatory Visit: Payer: Medicare Other | Admitting: Primary Care

## 2019-10-29 ENCOUNTER — Ambulatory Visit: Payer: Medicare Other | Admitting: Primary Care

## 2019-11-16 ENCOUNTER — Other Ambulatory Visit: Payer: Self-pay

## 2019-11-16 ENCOUNTER — Ambulatory Visit (AMBULATORY_SURGERY_CENTER): Payer: Medicare Other | Admitting: *Deleted

## 2019-11-16 VITALS — Temp 97.1°F | Ht 65.0 in | Wt 208.8 lb

## 2019-11-16 DIAGNOSIS — Z1159 Encounter for screening for other viral diseases: Secondary | ICD-10-CM

## 2019-11-16 DIAGNOSIS — Z86010 Personal history of colonic polyps: Secondary | ICD-10-CM

## 2019-11-16 MED ORDER — SUPREP BOWEL PREP KIT 17.5-3.13-1.6 GM/177ML PO SOLN: 1.0000 | Freq: Once | ORAL | 0 refills | Status: AC

## 2019-11-16 NOTE — Progress Notes (Signed)
No egg or soy allergy known to patient  No issues with past sedation with any surgeries  or procedures, no intubation problems  No diet pills per patient No home 02 use per patient  No blood thinners per patient  Pt states  issues with constipation  That alternates with diarrhea- she doesn't use anything OTC despite she has been told to use Miralax daily but she doesn't  No A fib or A flutter  EMMI video sent to pt's e mail  01-2019 in hospital for ischemic colitis- pt continues to have pain to left lower abdomen- it feels like the same area as 01-2019 with the colitis - no rectal bleeding currently- no fever   Due to the COVID-19 pandemic we are asking patients to follow these guidelines. Please only bring one care partner. Please be aware that your care partner may wait in the car in the parking lot or if they feel like they will be too hot to wait in the car, they may wait in the lobby on the 4th floor. All care partners are required to wear a mask the entire time (we do not have any that we can provide them), they need to practice social distancing, and we will do a Covid check for all patient's and care partners when you arrive. Also we will check their temperature and your temperature. If the care partner waits in their car they need to stay in the parking lot the entire time and we will call them on their cell phone when the patient is ready for discharge so they can bring the car to the front of the building. Also all patient's will need to wear a mask into building.

## 2019-11-19 ENCOUNTER — Encounter: Payer: Self-pay | Admitting: Gastroenterology

## 2019-11-26 ENCOUNTER — Encounter: Payer: Medicare Other | Admitting: Gastroenterology

## 2019-12-04 ENCOUNTER — Encounter: Payer: Medicare Other | Admitting: Gastroenterology

## 2020-01-11 ENCOUNTER — Other Ambulatory Visit: Payer: Self-pay

## 2020-01-11 ENCOUNTER — Ambulatory Visit (INDEPENDENT_AMBULATORY_CARE_PROVIDER_SITE_OTHER): Payer: Medicare Other | Admitting: Primary Care

## 2020-01-11 ENCOUNTER — Encounter: Payer: Self-pay | Admitting: Primary Care

## 2020-01-11 DIAGNOSIS — I10 Essential (primary) hypertension: Secondary | ICD-10-CM | POA: Diagnosis not present

## 2020-01-11 DIAGNOSIS — R61 Generalized hyperhidrosis: Secondary | ICD-10-CM | POA: Diagnosis not present

## 2020-01-11 MED ORDER — LOSARTAN POTASSIUM 100 MG PO TABS: 100.0000 mg | ORAL_TABLET | Freq: Every day | ORAL | 0 refills | Status: DC

## 2020-01-11 NOTE — Patient Instructions (Signed)
Stop lisinopril. Start losartan 100 mg for blood pressure.  Continue to monitor your blood pressure at home.  Schedule a follow up visit for blood pressure check for 2 weeks.  It was a pleasure to see you today!

## 2020-01-11 NOTE — Progress Notes (Signed)
Subjective:    Patient ID: Denise [name], female    DOB: [birth year], 69 y.o.   MRN: [id number]  HPI  This visit occurred during the SARS-CoV-2 public health emergency.  Safety protocols were in place, including screening questions prior to the visit, additional usage of staff PPE, and extensive cleaning of exam room while observing appropriate contact time as indicated for disinfecting solutions.   Denise [name] is a 69 year old female with a history of hypertension, migraines, GERD, Hypothyroidism, prediabetes who presents today for follow up of hypertension.  She is currently managed on lisinopril 20 mg and metoprolol succinate 25 mg. She was last evaluated in late October 2020 for elevated BP readings, lisinopril was increased to 20 mg at the time. She was seen one week later for abdominal pain, BP elevated due to pain. She was also noted to have chronically elevated alkaline phos.  Since her last visit she is complaint to her lisinopril and metoprolol succinate. She's checking her BP at home which is running 140's-150's/90's. She has noticed a dry tickle type of cough that began about one year ago.   She denies chest pain, dizziness. She has noticed some headaches.   She continues to experience excessive sweating that began several years ago. During winter months she finds that she doesn't need to wear coats/jackets and in the Summer she's miserable with sweating. She can feel the heat from the inside out. Prior to a few years ago she had no issues.   BP Readings from Last 3 Encounters:  01/11/20 (!) 146/92  10/22/19 (!) 140/92  10/13/19 (!) 148/90     Review of Systems  Eyes: Negative for visual disturbance.  Respiratory: Positive for cough. Negative for shortness of breath.   Cardiovascular: Negative for chest pain.  Neurological: Positive for headaches. Negative for dizziness.       Past Medical History:  Diagnosis Date  . Allergy    seasonal  . Arthritis   .  Bursitis of hip    Left  . Constipation    90%of the time c/o constipation, occ has diarrhea - uses stool softener   . Endometriosis   . Facial flushing 05/12/2014  . Fibroid   . GERD (gastroesophageal reflux disease)   . Hematochezia   . Hiatal hernia   . HSV-1 (herpes simplex virus 1) infection   . Hyperlipidemia   . Hyperplastic colon polyp   . Hypertension   . Hypothyroidism   . Ischemic colitis ([place]) 01/2019  . LBBB (left bundle branch block)   . Low back pain   . Migraine   . Osteopenia 2008   -1.2 FEMORAL NECK  . Stroke [facility])    TIA  . TIA (transient ischemic attack) 08-10-2013     Social History   Socioeconomic History  . Marital status: Divorced    Spouse name: Not on file  . Number of children: 2  . Years of education: Not on file  . Highest education level: Not on file  Occupational History    Employer: RETIRED  Tobacco Use  . Smoking status: Never Smoker  . Smokeless tobacco: Never Used  Substance and Sexual Activity  . Alcohol use: Yes    Alcohol/week: 0.0 standard drinks    Comment: 1 every 3 months  . Drug use: No  . Sexual activity: Not Currently    Comment: intercourse age 30, more than 5 sexual partners,des neg  Other Topics Concern  . Not on file  Social History  Narrative   Single.   2 daughters, 4 grandchildren.   Once worked for [employer].   Enjoys Ambulance person.    Social Determinants of Health   Financial Resource Strain: Low Risk   . Difficulty of Paying Living Expenses: Not hard at all  Food Insecurity: No Food Insecurity  . Worried About Charity fundraiser in the Last Year: Never true  . Ran Out of Food in the Last Year: Never true  Transportation Needs: No Transportation Needs  . Lack of Transportation (Medical): No  . Lack of Transportation (Non-Medical): No  Physical Activity: Unknown  . Days of Exercise per Week: Patient refused  . Minutes of Exercise per Session: Patient refused  Stress: No  Stress Concern Present  . Feeling of Stress : Not at all  Social Connections:   . Frequency of Communication with Friends and Family: Not on file  . Frequency of Social Gatherings with Friends and Family: Not on file  . Attends Religious Services: Not on file  . Active Member of Clubs or Organizations: Not on file  . Attends Archivist Meetings: Not on file  . Marital Status: Not on file  Intimate Partner Violence:   . Fear of Current or Ex-Partner: Not on file  . Emotionally Abused: Not on file  . Physically Abused: Not on file  . Sexually Abused: Not on file    Past Surgical History:  Procedure Laterality Date  . ABDOMINAL HYSTERECTOMY  1989   TAH, [place]  2003  . COLONOSCOPY    . KNEE SURGERY  2004   right  . NASAL SEPTUM SURGERY  2012  . PITUITARY SURGERY  2001  . POLYPECTOMY    . SHOULDER SURGERY  2007   right  . THYROID LOBECTOMY  2002    Family History  Problem Relation Age of Onset  . Heart disease Father   . Cancer Mother        brain tumor  . Breast cancer Maternal Aunt   . Parkinson's disease Sister   . Colon cancer Neg Hx   . Colon polyps Neg Hx   . Rectal cancer Neg Hx   . Stomach cancer Neg Hx   . Thyroid disease Neg Hx   . Esophageal cancer Neg Hx     Allergies  Allergen Reactions  . Tramadol Other (See Comments)    Reaction: NIGHTMARES    Current Outpatient Medications on File Prior to Visit  Medication Sig Dispense Refill  . aspirin 81 MG tablet Take 81 mg by mouth daily.    Marland Kitchen atorvastatin (LIPITOR) 10 MG tablet TAKE 1 TABLET BY MOUTH  DAILY 90 tablet 1  . cetirizine (ZYRTEC) 10 MG tablet Take 10 mg by mouth as needed for allergies. Reported on 05/28/2016    . diclofenac sodium (VOLTAREN) 1 % GEL Apply 1 g topically 4 (four) times daily as needed for pain.    . DULoxetine (CYMBALTA) 60 MG capsule TAKE 1 CAPSULE BY MOUTH  EVERY DAY FOR HEADACHE  PREVENTION. 90 capsule 0  . esomeprazole (NEXIUM) 20 MG capsule Take 20  mg by mouth daily at 12 noon.    Marland Kitchen levothyroxine (SYNTHROID) 75 MCG tablet TAKE 1 TABLET BY MOUTH  EVERY MORNING ON AN EMPTY  STOMACH WITH WATER ONLY (NO FOOD OR OTHER MEDICATIONS  FOR 30 MINUTES) 90 tablet 1  . lisinopril (ZESTRIL) 20 MG tablet Take 1 tablet (20 mg total) by mouth daily. For  blood pressure. 90 tablet 0  . metoprolol succinate (TOPROL-XL) 25 MG 24 hr tablet TAKE 1 TABLET BY MOUTH  DAILY 90 tablet 1  . naratriptan (AMERGE) 2.5 MG tablet TAKE 1 TABLET BY MOUTH AS NEEDED FOR MIGRAINE. MAY REPEAT IN 4 HOURS IF SYMPTOMS PERSIST. MAX 2 TABLETS IN 24 HOURS 20 tablet 0  . Polyethylene Glycol 3350 (MIRALAX PO) Take by mouth. 119 grams for colon 2 day prep     No current facility-administered medications on file prior to visit.    BP (!) 146/92   Pulse 92   Temp (!) 96.5 F (35.8 C) (Temporal)   Ht 5\' 5"  (1.651 m)   Wt 210 lb (95.3 kg)   SpO2 97%   BMI 34.95 kg/m    Objective:   Physical Exam  Constitutional: She appears well-nourished.  Cardiovascular: Normal rate and regular rhythm.  Respiratory: Effort normal and breath sounds normal.  Musculoskeletal:     Cervical back: Neck supple.  Skin: Skin is warm and dry.  Psychiatric: She has a normal mood and affect.           Assessment & Plan:

## 2020-01-11 NOTE — Assessment & Plan Note (Signed)
Worse despite dose increase of lisinopril during her last visit. Do suspect ACE induced cough, also could be causing her sweating.  Stop lisinopril. Start losartan but at 100 mg as a medium dose of lisinopril isn't enough.   She will check BP at home, we will see her back in 2 weeks for BP check and labs.

## 2020-01-11 NOTE — Assessment & Plan Note (Addendum)
Continued.  Question ACE to be causing flushing? Will follow up in a few weeks after stopping ACE today. Labs during her last visit unremarkable.

## 2020-01-14 DIAGNOSIS — I10 Essential (primary) hypertension: Secondary | ICD-10-CM

## 2020-01-18 ENCOUNTER — Ambulatory Visit (INDEPENDENT_AMBULATORY_CARE_PROVIDER_SITE_OTHER): Payer: Medicare Other | Admitting: Family Medicine

## 2020-01-18 ENCOUNTER — Other Ambulatory Visit: Payer: Self-pay

## 2020-01-18 ENCOUNTER — Encounter: Payer: Self-pay | Admitting: Family Medicine

## 2020-01-18 VITALS — BP 130/80 | HR 77 | Temp 96.6°F | Ht 65.0 in | Wt 210.8 lb

## 2020-01-18 DIAGNOSIS — M7062 Trochanteric bursitis, left hip: Secondary | ICD-10-CM

## 2020-01-18 MED ORDER — METHYLPREDNISOLONE ACETATE 40 MG/ML IJ SUSP
80.0000 mg | Freq: Once | INTRAMUSCULAR | Status: AC
  Administered 2020-01-18: 80 mg via INTRA_ARTICULAR

## 2020-01-18 NOTE — Progress Notes (Signed)
Denise Awan T. Treyana Sturgell, MD Primary Care and Sports Medicine [facility] at [facility] [place], [address] Phone: [phone]  FAX: [place] - 69 y.o. female  MRN [id number]  Date of Birth: 04-15-1951  Visit Date: 01/18/2020  PCP: Pleas Koch, NP  Referred by: Pleas Koch, NP  Chief Complaint  Patient presents with  . Hip Pain    Left    This visit occurred during the SARS-CoV-2 public health emergency.  Safety protocols were in place, including screening questions prior to the visit, additional usage of staff PPE, and extensive cleaning of exam room while observing appropriate contact time as indicated for disinfecting solutions.   Subjective:   Denise [name] is a 69 y.o. very pleasant female patient with Body mass index is 35.07 kg/m. who presents with the following:  Had some L hip pain a few years ago.  Has had some occ flare ups. Lateral hip as well.   She is a nice lady she presents with some left-sided hip pain.  She has had this a few times and had a GTB injection several years ago which provided her some symptomatic relief.  She is trying to walk for exercise to help with weight loss.  She now has pain on the lateral aspect of her hip again as well.  She does not have any pain in her groin.  She also has some lateral thigh pain as well.  She has not had any particular injury or traumatic event.  Past Medical History, Surgical History, Social History, Family History, Problem List, Medications, and Allergies have been reviewed and updated if relevant.  Review of Systems is noted in the HPI, as appropriate   Objective:   BP 130/80   Pulse 77   Temp (!) 96.6 F (35.9 C) (Temporal)   Ht 5\' 5"  (1.651 m)   Wt 210 lb 12 oz (95.6 kg)   SpO2 96%   BMI 35.07 kg/m    GEN: WDWN, NAD, Non-toxic, Alert & Oriented x 3 HEENT: Atraumatic, Normocephalic.  Ears and Nose: No external  deformity. EXTR: No clubbing/cyanosis/edema NEURO: Normal gait.  PSYCH: Normally interactive. Conversant. Not depressed or anxious appearing.  Calm demeanor.   HIP EXAM: SIDE: L ROM: Abduction, Flexion, Internal and External range of motion: full Pain with terminal IROM and EROM: no GTB: TTp and along ITB SLR: NEG Knees: No effusion FABER: NT REVERSE FABER: NT, neg Piriformis: NT at direct palpation Str: flexion: 5/5 abduction: 5/5 adduction: 5/5 Strength testing non-tender     Radiology: No results found.  Assessment and Plan:     ICD-10-CM   1. Trochanteric bursitis of left hip  M70.62    Classic example.  Diagnostic and therapeutic injection today.  Reviewed rehab with the patient, and I think primarily some range of motion work will help her.  Her strength is already pretty good.  Aspiration/Injection Procedure Note Lesleyanne Ribbeck 1951-06-01 Date of procedure: 01/18/2020  Procedure: Large Joint Aspiration / Injection of Hip, Trochanteric Bursa, L Indications: Pain  Procedure Details Verbal consent obtained. Risks (including infection, potential atrophy), benefits, and alternatives reviewed. Greater trochanter sterilely prepped with Chloraprep. Ethyl Chloride used for anesthesia. 8 cc of Lidocaine 1% injected with 2 mL of Depo-Medrol 40 mg into trochanteric bursa at area of maximal tenderness at greater trochanter. Needle taken to bone to troch bursa, flows easily. Bursa massaged. No bleeding and no complications. Decreased pain after  injection. Needle: 22 gauge spinal needle Medication: 2 mL of Depo-Medrol 40 mg, equaling Depo-Medrol 80 mg total   Follow-up: No follow-ups on file.  No orders of the defined types were placed in this encounter.  There are no discontinued medications. No orders of the defined types were placed in this encounter.   Signed,  Maud Deed. Mikinzie Maciejewski, MD   Outpatient Encounter Medications as of 01/18/2020  Medication Sig  .  aspirin 81 MG tablet Take 81 mg by mouth daily.  Marland Kitchen atorvastatin (LIPITOR) 10 MG tablet TAKE 1 TABLET BY MOUTH  DAILY  . cetirizine (ZYRTEC) 10 MG tablet Take 10 mg by mouth as needed for allergies. Reported on 05/28/2016  . diclofenac sodium (VOLTAREN) 1 % GEL Apply 1 g topically 4 (four) times daily as needed for pain.  . DULoxetine (CYMBALTA) 60 MG capsule TAKE 1 CAPSULE BY MOUTH  EVERY DAY FOR HEADACHE  PREVENTION.  Marland Kitchen esomeprazole (NEXIUM) 20 MG capsule Take 20 mg by mouth daily at 12 noon.  Marland Kitchen levothyroxine (SYNTHROID) 75 MCG tablet TAKE 1 TABLET BY MOUTH  EVERY MORNING ON AN EMPTY  STOMACH WITH WATER ONLY (NO FOOD OR OTHER MEDICATIONS  FOR 30 MINUTES)  . losartan (COZAAR) 100 MG tablet Take 1 tablet (100 mg total) by mouth daily. For blood pressure.  . metoprolol succinate (TOPROL-XL) 25 MG 24 hr tablet TAKE 1 TABLET BY MOUTH  DAILY  . naratriptan (AMERGE) 2.5 MG tablet TAKE 1 TABLET BY MOUTH AS NEEDED FOR MIGRAINE. MAY REPEAT IN 4 HOURS IF SYMPTOMS PERSIST. MAX 2 TABLETS IN 24 HOURS  . Polyethylene Glycol 3350 (MIRALAX PO) Take by mouth. 119 grams for colon 2 day prep   No facility-administered encounter medications on file as of 01/18/2020.

## 2020-01-21 ENCOUNTER — Encounter: Payer: Self-pay | Admitting: Physician Assistant

## 2020-01-21 ENCOUNTER — Ambulatory Visit: Payer: Medicare Other | Admitting: Physician Assistant

## 2020-01-21 VITALS — BP 140/90 | HR 120 | Temp 99.0°F | Ht 64.5 in | Wt 208.2 lb

## 2020-01-21 DIAGNOSIS — Z01818 Encounter for other preprocedural examination: Secondary | ICD-10-CM

## 2020-01-21 DIAGNOSIS — K219 Gastro-esophageal reflux disease without esophagitis: Secondary | ICD-10-CM | POA: Diagnosis not present

## 2020-01-21 DIAGNOSIS — Z86010 Personal history of colonic polyps: Secondary | ICD-10-CM | POA: Diagnosis not present

## 2020-01-21 DIAGNOSIS — K5732 Diverticulitis of large intestine without perforation or abscess without bleeding: Secondary | ICD-10-CM | POA: Diagnosis not present

## 2020-01-21 DIAGNOSIS — K5730 Diverticulosis of large intestine without perforation or abscess without bleeding: Secondary | ICD-10-CM | POA: Diagnosis not present

## 2020-01-21 HISTORY — DX: Diverticulosis of large intestine without perforation or abscess without bleeding: K57.30

## 2020-01-21 MED ORDER — ESOMEPRAZOLE MAGNESIUM 20 MG PO CPDR: 20.0000 mg | DELAYED_RELEASE_CAPSULE | ORAL | 11 refills | Status: DC

## 2020-01-21 MED ORDER — NA SULFATE-K SULFATE-MG SULF 17.5-3.13-1.6 GM/177ML PO SOLN: 1.0000 | Freq: Once | ORAL | 0 refills | Status: AC

## 2020-01-21 MED ORDER — AMOXICILLIN-POT CLAVULANATE 875-125 MG PO TABS: 1.0000 | ORAL_TABLET | Freq: Two times a day (BID) | ORAL | 0 refills | Status: DC

## 2020-01-21 NOTE — Progress Notes (Signed)
Subjective:    Patient ID: Denise [name], female    DOB: 1951/02/25, 69 y.o.   MRN: [id number]  HPI Denise [name] is a pleasant 69 year old female, established with Dr. Havery Moros who comes in today with left lower quadrant pain. Patient has history of hypertension, GERD, hypothyroidism, adenomatous and sessile serrated polyps, diverticulosis and relates having been diagnosed with an episode of ischemic colitis in February 2020. Last colonoscopy was done in June 2017 with removal of 4 polyps noted to have a few sigmoid diverticuli and internal hemorrhoids.  Path was consistent with tubular adenomas, a sessile serrated adenoma and a hyperplastic polyp.  She is indicated for 3-year interval follow-up.  Patient says she has been having some intermittent left-sided abdominal discomfort over the past couple of months, but over the past 1-1/2 weeks has noticed constant left lower quadrant pain/discomfort which is actually awakened her from sleep.  She has not noticed any change in her pain with p.o. intake, no fever or chills.  No nausea or vomiting.  She does have history of constipation and says it has been a bit worse lately, she is also had an increase in reflux symptoms.  No melena or hematochezia.  She denies any dysuria urgency etc. She has been on 20 mg of Nexium p.o. every morning.  Review of Systems Pertinent positive and negative review of systems were noted in the above HPI section.  All other review of systems was otherwise negative.  Outpatient Encounter Medications as of 01/21/2020  Medication Sig  . aspirin 81 MG tablet Take 81 mg by mouth daily.  Marland Kitchen atorvastatin (LIPITOR) 10 MG tablet TAKE 1 TABLET BY MOUTH  DAILY  . cetirizine (ZYRTEC) 10 MG tablet Take 10 mg by mouth as needed for allergies. Reported on 05/28/2016  . diclofenac sodium (VOLTAREN) 1 % GEL Apply 1 g topically 4 (four) times daily as needed for pain.  . DULoxetine (CYMBALTA) 60 MG capsule TAKE 1 CAPSULE BY MOUTH  EVERY DAY  FOR HEADACHE  PREVENTION.  Marland Kitchen esomeprazole (NEXIUM) 20 MG capsule Take 1 capsule (20 mg total) by mouth every morning.  Marland Kitchen levothyroxine (SYNTHROID) 75 MCG tablet TAKE 1 TABLET BY MOUTH  EVERY MORNING ON AN EMPTY  STOMACH WITH WATER ONLY (NO FOOD OR OTHER MEDICATIONS  FOR 30 MINUTES)  . losartan (COZAAR) 100 MG tablet Take 1 tablet (100 mg total) by mouth daily. For blood pressure.  . metoprolol succinate (TOPROL-XL) 25 MG 24 hr tablet TAKE 1 TABLET BY MOUTH  DAILY  . naratriptan (AMERGE) 2.5 MG tablet TAKE 1 TABLET BY MOUTH AS NEEDED FOR MIGRAINE. MAY REPEAT IN 4 HOURS IF SYMPTOMS PERSIST. MAX 2 TABLETS IN 24 HOURS  . Polyethylene Glycol 3350 (MIRALAX PO) Take by mouth. 119 grams for colon 2 day prep  . [DISCONTINUED] esomeprazole (NEXIUM) 20 MG capsule Take 20 mg by mouth daily at 12 noon.  Marland Kitchen amoxicillin-clavulanate (AUGMENTIN) 875-125 MG tablet Take 1 tablet by mouth 2 (two) times daily.  . Na Sulfate-K Sulfate-Mg Sulf 17.5-3.13-1.6 GM/177ML SOLN Take 1 kit by mouth once for 1 dose.   No facility-administered encounter medications on file as of 01/21/2020.   Allergies  Allergen Reactions  . Tramadol Other (See Comments)    Reaction: NIGHTMARES   Patient Active Problem List   Diagnosis Date Noted  . Diverticulosis of colon without hemorrhage 01/21/2020  . Hx of adenomatous colonic polyps 01/21/2020  . Left lower quadrant abdominal pain 10/22/2019  . Excessive sweating 10/13/2019  . Prediabetes 10/13/2019  .  Hyperlipidemia 02/02/2019  . Colitis 01/26/2019  . Chest pain 02/25/2017  . Welcome to Medicare preventive visit 08/25/2015  . Essential hypertension 08/25/2015  . Pituitary adenoma ([place]) 05/12/2014  . GERD (gastroesophageal reflux disease) 09/01/2012  . Hypothyroidism   . Osteopenia   . Migraine   . Fibroid   . HSV-1 (herpes simplex virus 1) infection    Social History   Socioeconomic History  . Marital status: Divorced    Spouse name: Not on file  . Number of children: 2   . Years of education: Not on file  . Highest education level: Not on file  Occupational History    Employer: RETIRED  Tobacco Use  . Smoking status: Never Smoker  . Smokeless tobacco: Never Used  Substance and Sexual Activity  . Alcohol use: Yes    Alcohol/week: 0.0 standard drinks    Comment: 1 every 3 months  . Drug use: No  . Sexual activity: Not Currently    Comment: intercourse age 75, more than 5 sexual partners,des neg  Other Topics Concern  . Not on file  Social History Narrative   Single.   2 daughters, 4 grandchildren.   Once worked for [employer].   Enjoys Ambulance person.    Social Determinants of Health   Financial Resource Strain: Low Risk   . Difficulty of Paying Living Expenses: Not hard at all  Food Insecurity: No Food Insecurity  . Worried About Charity fundraiser in the Last Year: Never true  . Ran Out of Food in the Last Year: Never true  Transportation Needs: No Transportation Needs  . Lack of Transportation (Medical): No  . Lack of Transportation (Non-Medical): No  Physical Activity: Unknown  . Days of Exercise per Week: Patient refused  . Minutes of Exercise per Session: Patient refused  Stress: No Stress Concern Present  . Feeling of Stress : Not at all  Social Connections:   . Frequency of Communication with Friends and Family: Not on file  . Frequency of Social Gatherings with Friends and Family: Not on file  . Attends Religious Services: Not on file  . Active Member of Clubs or Organizations: Not on file  . Attends Archivist Meetings: Not on file  . Marital Status: Not on file  Intimate Partner Violence:   . Fear of Current or Ex-Partner: Not on file  . Emotionally Abused: Not on file  . Physically Abused: Not on file  . Sexually Abused: Not on file    Denise [name]'s family history includes Breast cancer in her maternal aunt; Cancer in her mother; Heart disease in her father; Parkinson's disease in  her sister.      Objective:    Vitals:   01/21/20 1015  BP: 140/90  Pulse: (!) 120  Temp: 99 F (37.2 C)    Physical Exam Well-developed well-nourished female in no acute distress.  Height, Weight,208 BMI 35,1  HEENT; nontraumatic normocephalic, EOMI, PER R LA, sclera anicteric. Oropharynx; not done Neck; supple, no JVD Cardiovascular; regular rate and rhythm with S1-S2, no murmur rub or gallop Pulmonary; Clear bilaterally Abdomen; soft, she is tender in the left lower quadrant, no guarding or rebound,, nondistended, no palpable mass or hepatosplenomegaly, bowel sounds are active Rectal; not done today Skin; benign exam, no jaundice rash or appreciable lesions Extremities; no clubbing cyanosis or edema skin warm and dry Neuro/Psych; alert and oriented x4, grossly nonfocal mood and affect appropriate  Assessment & Plan:   #26 69 year old white female with 1 and half week history of constant left lower quadrant pain.  She is tender on exam and I suspect she has acute diverticulitis.  #2 history of segmental ischemic colitis February 2020 #3 history of adenomatous and sessile serrated polyps-due for follow-up colonoscopy #4 GERD-chronic with poorly controlled symptoms over the past couple of months  #5 hypertension  Plan; We reviewed an antireflux regimen, she will be given an antireflux diet and asked to tighten up on her reflux precautions. We will send prescription for Nexium 40 mg p.o. every morning Start Augmentin 875 mg p.o. twice daily x10 days. Start MiraLAX 17 g in 8 ounces of water daily or every other day on a scheduled basis. CBC and be met today. Will schedule for follow-up colonoscopy with Dr. Havery Moros, and intentionally schedule this in March to allow her time to resolve this episode of diverticulitis.  Procedure was discussed in detail with the patient including indications risks and benefits and she is agreeable to proceed.  She is advised to call  should she have any worsening of her left-sided abdominal pain over the next couple of weeks or fails to resolve with the course of Augmentin.   Nichlas Pitera S Mckinnon Glick PA-C 01/21/2020   Cc: Pleas Koch, NP

## 2020-01-21 NOTE — Patient Instructions (Addendum)
If you are age 69 or older, your body mass index should be between 23-30. Your Body mass index is 35.19 kg/m. If this is out of the aforementioned range listed, please consider follow up with your Primary Care Provider.  If you are age 69 or younger, your body mass index should be between 19-25. Your Body mass index is 35.19 kg/m. If this is out of the aformentioned range listed, please consider follow up with your Primary Care Provider.   You have been scheduled for a colonoscopy. Please follow written instructions given to you at your visit today.  Please pick up your prep supplies at the pharmacy within the next 1-3 days. If you use inhalers (even only as needed), please bring them with you on the day of your procedure.  We have sent the following medications to your pharmacy for you to pick up at your convenience:  Suprep Nexium40mg  Augmentin 875mg   Please use Miralax 17gms in 8ounces of water daily. We would like for you to start an anti-reflux diet.   Gastroesophageal Reflux Disease, Adult Gastroesophageal reflux (GER) happens when acid from the stomach flows up into the tube that connects the mouth and the stomach (esophagus). Normally, food travels down the esophagus and stays in the stomach to be digested. With GER, food and stomach acid sometimes move back up into the esophagus. You may have a disease called gastroesophageal reflux disease (GERD) if the reflux:  Happens often.  Causes frequent or very bad symptoms.  Causes problems such as damage to the esophagus. When this happens, the esophagus becomes sore and swollen (inflamed). Over time, GERD can make small holes (ulcers) in the lining of the esophagus. What are the causes? This condition is caused by a problem with the muscle between the esophagus and the stomach. When this muscle is weak or not normal, it does not close properly to keep food and acid from coming back up from the stomach. The muscle can be weak because  of:  Tobacco use.  Pregnancy.  Having a certain type of hernia (hiatal hernia).  Alcohol use.  Certain foods and drinks, such as coffee, chocolate, onions, and peppermint. What increases the risk? You are more likely to develop this condition if you:  Are overweight.  Have a disease that affects your connective tissue.  Use NSAID medicines. What are the signs or symptoms? Symptoms of this condition include:  Heartburn.  Difficult or painful swallowing.  The feeling of having a lump in the throat.  A bitter taste in the mouth.  Bad breath.  Having a lot of saliva.  Having an upset or bloated stomach.  Belching.  Chest pain. Different conditions can cause chest pain. Make sure you see your doctor if you have chest pain.  Shortness of breath or noisy breathing (wheezing).  Ongoing (chronic) cough or a cough at night.  Wearing away of the surface of teeth (tooth enamel).  Weight loss. How is this treated? Treatment will depend on how bad your symptoms are. Your doctor may suggest:  Changes to your diet.  Medicine.  Surgery. Follow these instructions at home: Eating and drinking   Follow a diet as told by your doctor. You may need to avoid foods and drinks such as: ? Coffee and tea (with or without caffeine). ? Drinks that contain alcohol. ? Energy drinks and sports drinks. ? Bubbly (carbonated) drinks or sodas. ? Chocolate and cocoa. ? Peppermint and mint flavorings. ? Garlic and onions. ? Horseradish. ? Spicy  and acidic foods. These include peppers, chili powder, curry powder, vinegar, hot sauces, and BBQ sauce. ? Citrus fruit juices and citrus fruits, such as oranges, lemons, and limes. ? Tomato-based foods. These include red sauce, chili, salsa, and pizza with red sauce. ? Fried and fatty foods. These include donuts, french fries, potato chips, and high-fat dressings. ? High-fat meats. These include hot dogs, rib eye steak, sausage, ham, and  bacon. ? High-fat dairy items, such as whole milk, butter, and cream cheese.  Eat small meals often. Avoid eating large meals.  Avoid drinking large amounts of liquid with your meals.  Avoid eating meals during the 2-3 hours before bedtime.  Avoid lying down right after you eat.  Do not exercise right after you eat. Lifestyle   Do not use any products that contain nicotine or tobacco. These include cigarettes, e-cigarettes, and chewing tobacco. If you need help quitting, ask your doctor.  Try to lower your stress. If you need help doing this, ask your doctor.  If you are overweight, lose an amount of weight that is healthy for you. Ask your doctor about a safe weight loss goal. General instructions  Pay attention to any changes in your symptoms.  Take over-the-counter and prescription medicines only as told by your doctor. Do not take aspirin, ibuprofen, or other NSAIDs unless your doctor says it is okay.  Wear loose clothes. Do not wear anything tight around your waist.  Raise (elevate) the head of your bed about 6 inches (15 cm).  Avoid bending over if this makes your symptoms worse.  Keep all follow-up visits as told by your doctor. This is important. Contact a doctor if:  You have new symptoms.  You lose weight and you do not know why.  You have trouble swallowing or it hurts to swallow.  You have wheezing or a cough that keeps happening.  Your symptoms do not get better with treatment.  You have a hoarse voice. Get help right away if:  You have pain in your arms, neck, jaw, teeth, or back.  You feel sweaty, dizzy, or light-headed.  You have chest pain or shortness of breath.  You throw up (vomit) and your throw-up looks like blood or coffee grounds.  You pass out (faint).  Your poop (stool) is bloody or black.  You cannot swallow, drink, or eat. Summary  If a person has gastroesophageal reflux disease (GERD), food and stomach acid move back up into  the esophagus and cause symptoms or problems such as damage to the esophagus.  Treatment will depend on how bad your symptoms are.  Follow a diet as told by your doctor.  Take all medicines only as told by your doctor. This information is not intended to replace advice given to you by your health care provider. Make sure you discuss any questions you have with your health care provider. Document Revised: 06/11/2018 Document Reviewed: 06/11/2018 Elsevier Patient Education  [employer]. Due to recent changes in healthcare laws, you may see the results of your imaging and laboratory studies on MyChart before your provider has had a chance to review them.  We understand that in some cases there may be results that are confusing or concerning to you. Not all laboratory results come back in the same time frame and the provider may be waiting for multiple results in order to interpret others.  Please give [place] 48 hours in order for your provider to thoroughly review all the results before contacting the  office for clarification of your results.

## 2020-01-22 NOTE — Progress Notes (Signed)
Agree with assessment and plan as outlined.  

## 2020-01-25 ENCOUNTER — Other Ambulatory Visit: Payer: Self-pay | Admitting: Primary Care

## 2020-01-25 ENCOUNTER — Ambulatory Visit (INDEPENDENT_AMBULATORY_CARE_PROVIDER_SITE_OTHER): Payer: Medicare Other | Admitting: Primary Care

## 2020-01-25 ENCOUNTER — Other Ambulatory Visit: Payer: Self-pay

## 2020-01-25 ENCOUNTER — Encounter: Payer: Self-pay | Admitting: Primary Care

## 2020-01-25 VITALS — BP 130/80 | HR 108 | Temp 97.9°F | Ht 64.5 in | Wt 209.2 lb

## 2020-01-25 DIAGNOSIS — I10 Essential (primary) hypertension: Secondary | ICD-10-CM

## 2020-01-25 DIAGNOSIS — R519 Headache, unspecified: Secondary | ICD-10-CM

## 2020-01-25 DIAGNOSIS — R61 Generalized hyperhidrosis: Secondary | ICD-10-CM

## 2020-01-25 DIAGNOSIS — G43901 Migraine, unspecified, not intractable, with status migrainosus: Secondary | ICD-10-CM

## 2020-01-25 DIAGNOSIS — R748 Abnormal levels of other serum enzymes: Secondary | ICD-10-CM | POA: Diagnosis not present

## 2020-01-25 DIAGNOSIS — R7303 Prediabetes: Secondary | ICD-10-CM

## 2020-01-25 DIAGNOSIS — E039 Hypothyroidism, unspecified: Secondary | ICD-10-CM

## 2020-01-25 NOTE — Assessment & Plan Note (Signed)
Repeat A1C pending. 

## 2020-01-25 NOTE — Assessment & Plan Note (Signed)
Repeat TSH pending

## 2020-01-25 NOTE — Assessment & Plan Note (Signed)
Improved in the office today, cough has nearly resolved. Continue losartan 100 mg.  CMP pending.

## 2020-01-25 NOTE — Assessment & Plan Note (Addendum)
Chronic for three years, no improvement. Unclear cause as mentioned. Check labs today. Could be secondary to duloxetine as there is a 3% chance for "flushing".  Referral placed to dermatology. Consider cardiology evaluation.

## 2020-01-25 NOTE — Progress Notes (Signed)
Subjective:    Patient ID: Denise [name], female    DOB: 1951/09/28, 69 y.o.   MRN: [id number]  HPI  This visit occurred during the SARS-CoV-2 public health emergency.  Safety protocols were in place, including screening questions prior to the visit, additional usage of staff PPE, and extensive cleaning of exam room while observing appropriate contact time as indicated for disinfecting solutions.   Denise [name] is a 69 year old female with a history of colitis, diverticulosis, pituitary adenoma, hypothyroidism, prediabetes who presents today for follow up of hypertension.  She was last evaluated on 01/11/20 with reports of elevated blood pressure readings, dry cough, excessive sweating. BP was above goal in the office that day. Given symptoms her lisinopril was discontinued and she was initiated on losartan 100 mg. She was asked to follow up today.  Since her last visit she is checking her BP at home and is getting readings of 111/86 (today), 105/88 (today), 136/98, 139/6, 135/99. She continues to not feel well.   She believes she's struggling with depression, has had a hard time since initiation of Covid-19. She misses her family, hasn't seen anyone since early 2021. Her dry cough has nearly resolved. She's noticing palpitations at rest for the last several months, intermittently, thinks this is from her LBBB. She continues to excessively sweat which does not correlate with palpitations, has been present for 1-2 years. She is scheduled for the echocardiogram in late February 2021.  BP Readings from Last 3 Encounters:  01/25/20 130/80  01/21/20 140/90  01/18/20 130/80     Review of Systems  Respiratory: Negative for cough and shortness of breath.   Cardiovascular: Positive for palpitations. Negative for chest pain.  Skin:       Excessive sweating  Neurological: Negative for dizziness and headaches.       Past Medical History:  Diagnosis Date  . Allergy    seasonal  .  Arthritis   . Bursitis of hip    Left  . Constipation    90%of the time c/o constipation, occ has diarrhea - uses stool softener   . Endometriosis   . Facial flushing 05/12/2014  . Fibroid   . GERD (gastroesophageal reflux disease)   . Hematochezia   . Hiatal hernia   . HSV-1 (herpes simplex virus 1) infection   . Hyperlipidemia   . Hyperplastic colon polyp   . Hypertension   . Hypothyroidism   . Ischemic colitis ([place]) 01/2019  . LBBB (left bundle branch block)   . Low back pain   . Migraine   . Osteopenia 2008   -1.2 FEMORAL NECK  . Prediabetes   . Stroke [facility])    TIA  . TIA (transient ischemic attack) 08-10-2013     Social History   Socioeconomic History  . Marital status: Divorced    Spouse name: Not on file  . Number of children: 2  . Years of education: Not on file  . Highest education level: Not on file  Occupational History    Employer: RETIRED  Tobacco Use  . Smoking status: Never Smoker  . Smokeless tobacco: Never Used  Substance and Sexual Activity  . Alcohol use: Yes    Alcohol/week: 0.0 standard drinks    Comment: 1 every 3 months  . Drug use: No  . Sexual activity: Not Currently    Comment: intercourse age 34, more than 5 sexual partners,des neg  Other Topics Concern  . Not on file  Social History Narrative  Single.   2 daughters, 4 grandchildren.   Once worked for [employer].   Enjoys Ambulance person.    Social Determinants of Health   Financial Resource Strain: Low Risk   . Difficulty of Paying Living Expenses: Not hard at all  Food Insecurity: No Food Insecurity  . Worried About Charity fundraiser in the Last Year: Never true  . Ran Out of Food in the Last Year: Never true  Transportation Needs: No Transportation Needs  . Lack of Transportation (Medical): No  . Lack of Transportation (Non-Medical): No  Physical Activity: Unknown  . Days of Exercise per Week: Patient refused  . Minutes of Exercise per  Session: Patient refused  Stress: No Stress Concern Present  . Feeling of Stress : Not at all  Social Connections:   . Frequency of Communication with Friends and Family: Not on file  . Frequency of Social Gatherings with Friends and Family: Not on file  . Attends Religious Services: Not on file  . Active Member of Clubs or Organizations: Not on file  . Attends Archivist Meetings: Not on file  . Marital Status: Not on file  Intimate Partner Violence:   . Fear of Current or Ex-Partner: Not on file  . Emotionally Abused: Not on file  . Physically Abused: Not on file  . Sexually Abused: Not on file    Past Surgical History:  Procedure Laterality Date  . ABDOMINAL HYSTERECTOMY  1989   TAH, [place]  2003  . COLONOSCOPY    . KNEE SURGERY  2004   right  . NASAL SEPTUM SURGERY  2012  . PITUITARY SURGERY  2001  . POLYPECTOMY    . SHOULDER SURGERY  2007   right  . THYROID LOBECTOMY  2002    Family History  Problem Relation Age of Onset  . Heart disease Father   . Cancer Mother        brain tumor  . Breast cancer Maternal Aunt   . Parkinson's disease Sister   . Colon cancer Neg Hx   . Colon polyps Neg Hx   . Rectal cancer Neg Hx   . Stomach cancer Neg Hx   . Thyroid disease Neg Hx   . Esophageal cancer Neg Hx     Allergies  Allergen Reactions  . Tramadol Other (See Comments)    Reaction: NIGHTMARES    Current Outpatient Medications on File Prior to Visit  Medication Sig Dispense Refill  . aspirin 81 MG tablet Take 81 mg by mouth daily.    Marland Kitchen atorvastatin (LIPITOR) 10 MG tablet TAKE 1 TABLET BY MOUTH  DAILY 90 tablet 1  . cetirizine (ZYRTEC) 10 MG tablet Take 10 mg by mouth as needed for allergies. Reported on 05/28/2016    . diclofenac sodium (VOLTAREN) 1 % GEL Apply 1 g topically 4 (four) times daily as needed for pain.    . DULoxetine (CYMBALTA) 60 MG capsule TAKE 1 CAPSULE BY MOUTH  EVERY DAY FOR HEADACHE  PREVENTION. 90 capsule 0  .  esomeprazole (NEXIUM) 20 MG capsule Take 1 capsule (20 mg total) by mouth every morning. 30 capsule 11  . levothyroxine (SYNTHROID) 75 MCG tablet TAKE 1 TABLET BY MOUTH  EVERY MORNING ON AN EMPTY  STOMACH WITH WATER ONLY (NO FOOD OR OTHER MEDICATIONS  FOR 30 MINUTES) 90 tablet 1  . losartan (COZAAR) 100 MG tablet Take 1 tablet (100 mg total) by mouth daily. For blood  pressure. 30 tablet 0  . metoprolol succinate (TOPROL-XL) 25 MG 24 hr tablet TAKE 1 TABLET BY MOUTH  DAILY 90 tablet 1  . naratriptan (AMERGE) 2.5 MG tablet TAKE 1 TABLET BY MOUTH AS NEEDED FOR MIGRAINE. MAY REPEAT IN 4 HOURS IF SYMPTOMS PERSIST. MAX 2 TABLETS IN 24 HOURS 20 tablet 0  . Polyethylene Glycol 3350 (MIRALAX PO) Take by mouth. 119 grams for colon 2 day prep    . amoxicillin-clavulanate (AUGMENTIN) 875-125 MG tablet Take 1 tablet by mouth 2 (two) times daily. (Patient not taking: Reported on 01/25/2020) 20 tablet 0   No current facility-administered medications on file prior to visit.    BP 130/80   Pulse (!) 108   Temp 97.9 F (36.6 C) (Temporal)   Ht 5' 4.5" (1.638 m)   Wt 209 lb 4 oz (94.9 kg)   SpO2 96%   BMI 35.36 kg/m    Objective:   Physical Exam  Constitutional: She appears well-nourished.  Cardiovascular: Normal rate and regular rhythm.  Respiratory: Effort normal and breath sounds normal.  Musculoskeletal:     Cervical back: Neck supple.  Skin: Skin is warm and dry.  Patient perspired during exam today, mild. No obvious facial flushing.  Psychiatric: She has a normal mood and affect.           Assessment & Plan:

## 2020-01-25 NOTE — Patient Instructions (Signed)
Stop by the lab prior to leaving today. I will notify you of your results once received.   Complete the echocardiogram as scheduled.  You will be contacted regarding your referral to dermatology.  Please let us know if you have not been contacted within two weeks.   It was a pleasure to see you today!

## 2020-01-26 ENCOUNTER — Other Ambulatory Visit (INDEPENDENT_AMBULATORY_CARE_PROVIDER_SITE_OTHER): Payer: Medicare Other

## 2020-01-26 DIAGNOSIS — R748 Abnormal levels of other serum enzymes: Secondary | ICD-10-CM

## 2020-01-26 LAB — COMPREHENSIVE METABOLIC PANEL
ALT: 12 U/L (ref 0–35)
AST: 13 U/L (ref 0–37)
Albumin: 4.1 g/dL (ref 3.5–5.2)
Alkaline Phosphatase: 252 U/L — ABNORMAL HIGH (ref 39–117)
BUN: 23 mg/dL (ref 6–23)
CO2: 29 mEq/L (ref 19–32)
Calcium: 9.3 mg/dL (ref 8.4–10.5)
Chloride: 103 mEq/L (ref 96–112)
Creatinine, Ser: 0.79 mg/dL (ref 0.40–1.20)
GFR: 72.26 mL/min (ref >60.00)
Glucose, Bld: 71 mg/dL (ref 70–99)
Potassium: 4.4 mEq/L (ref 3.5–5.1)
Sodium: 138 mEq/L (ref 135–145)
Total Bilirubin: 0.3 mg/dL (ref 0.2–1.2)
Total Protein: 7.6 g/dL (ref 6.0–8.3)

## 2020-01-26 LAB — CBC
HCT: 44 % (ref 36.0–46.0)
Hemoglobin: 14.5 g/dL (ref 12.0–15.0)
MCHC: 33 g/dL (ref 30.0–36.0)
MCV: 82.4 fl (ref 78.0–100.0)
Platelets: 318 10*3/uL (ref 150.0–400.0)
RBC: 5.34 Mil/uL — ABNORMAL HIGH (ref 3.87–5.11)
RDW: 14.5 % (ref 11.5–15.5)
WBC: 11.1 10*3/uL — ABNORMAL HIGH (ref 4.0–10.5)

## 2020-01-26 LAB — HEMOGLOBIN A1C: Hgb A1c MFr Bld: 6.3 % (ref 4.6–6.5)

## 2020-01-26 LAB — GAMMA GT: GGT: 12 U/L (ref 7–51)

## 2020-01-26 LAB — TSH: TSH: 3.36 u[IU]/mL (ref 0.35–4.50)

## 2020-02-02 ENCOUNTER — Other Ambulatory Visit: Payer: Self-pay | Admitting: Primary Care

## 2020-02-02 DIAGNOSIS — R748 Abnormal levels of other serum enzymes: Secondary | ICD-10-CM

## 2020-02-02 DIAGNOSIS — I5189 Other ill-defined heart diseases: Secondary | ICD-10-CM

## 2020-02-02 DIAGNOSIS — I447 Left bundle-branch block, unspecified: Secondary | ICD-10-CM

## 2020-02-02 DIAGNOSIS — I10 Essential (primary) hypertension: Secondary | ICD-10-CM

## 2020-02-02 MED ORDER — LOSARTAN POTASSIUM 100 MG PO TABS: 100.0000 mg | ORAL_TABLET | Freq: Every day | ORAL | 3 refills | Status: DC

## 2020-02-03 ENCOUNTER — Other Ambulatory Visit: Payer: Self-pay | Admitting: Primary Care

## 2020-02-03 DIAGNOSIS — I10 Essential (primary) hypertension: Secondary | ICD-10-CM

## 2020-02-08 ENCOUNTER — Other Ambulatory Visit (INDEPENDENT_AMBULATORY_CARE_PROVIDER_SITE_OTHER): Payer: Medicare Other

## 2020-02-08 ENCOUNTER — Other Ambulatory Visit: Payer: Self-pay

## 2020-02-08 DIAGNOSIS — R748 Abnormal levels of other serum enzymes: Secondary | ICD-10-CM | POA: Diagnosis not present

## 2020-02-09 ENCOUNTER — Ambulatory Visit (INDEPENDENT_AMBULATORY_CARE_PROVIDER_SITE_OTHER): Payer: Medicare Other

## 2020-02-09 ENCOUNTER — Other Ambulatory Visit: Payer: Self-pay

## 2020-02-09 DIAGNOSIS — I447 Left bundle-branch block, unspecified: Secondary | ICD-10-CM

## 2020-02-09 DIAGNOSIS — I5189 Other ill-defined heart diseases: Secondary | ICD-10-CM | POA: Diagnosis not present

## 2020-02-09 DIAGNOSIS — Z86018 Personal history of other benign neoplasm: Secondary | ICD-10-CM

## 2020-02-09 MED ORDER — PERFLUTREN LIPID MICROSPHERE
1.0000 mL | INTRAVENOUS | Status: AC | PRN
  Administered 2020-02-09: 2 mL via INTRAVENOUS

## 2020-02-10 LAB — NUCLEOTIDASE, 5', BLOOD: 5-Nucleotidase: 5 U/L (ref 0–10)

## 2020-02-11 ENCOUNTER — Other Ambulatory Visit: Payer: Self-pay | Admitting: Primary Care

## 2020-02-11 DIAGNOSIS — R748 Abnormal levels of other serum enzymes: Secondary | ICD-10-CM

## 2020-02-11 DIAGNOSIS — R61 Generalized hyperhidrosis: Secondary | ICD-10-CM

## 2020-02-11 DIAGNOSIS — Z86018 Personal history of other benign neoplasm: Secondary | ICD-10-CM

## 2020-02-11 NOTE — Assessment & Plan Note (Signed)
Chronic for years, never addressed by other providers. GGT and nucloetidase 5 negative, also CT scan from 01/2019 with normal appearing liver.  Given these results I am not convinced that she has liver involvement.  We will need to check a bone scan to rule out malignant causes.  We also have a MRI of her brain pending to follow-up on prior non cancerous pituitary adenoma.  My chart message sent to patient, phone call also placed with voicemail left.

## 2020-02-16 ENCOUNTER — Telehealth: Payer: Self-pay | Admitting: Gastroenterology

## 2020-02-16 NOTE — Telephone Encounter (Signed)
Patient called states she is having a lot of lower left abd pain also wanted to know if she should still be having a colonoscopy done.

## 2020-02-16 NOTE — Addendum Note (Signed)
Addended by: Pleas Koch on: 02/16/2020 08:12 AM   Modules accepted: Orders

## 2020-02-17 ENCOUNTER — Other Ambulatory Visit: Payer: Self-pay

## 2020-02-17 ENCOUNTER — Telehealth: Payer: Self-pay

## 2020-02-17 DIAGNOSIS — R1032 Left lower quadrant pain: Secondary | ICD-10-CM

## 2020-02-17 MED ORDER — DICYCLOMINE HCL 10 MG PO CAPS: 10.0000 mg | ORAL_CAPSULE | Freq: Three times a day (TID) | ORAL | 1 refills | Status: DC | PRN

## 2020-02-17 NOTE — Telephone Encounter (Signed)
Called patient and she does want a CT. Scheduled CT-abd/pelvis w/contrast at [facility] on 02/24/20. Patient to arrive at 3:45pm and pick-up 2 bottles of contrast at [facility] radiology before 02/24/20.She is to drink the 1st bottle at 2:00pm and the 2nd bottle at 3:00pm the day of her CT. To be NPO 4 hours before, except for the contrast. Sent the above via MyChart, per patient's request

## 2020-02-17 NOTE — Telephone Encounter (Signed)
Chart reviewed. She has a history of suspected ischemic colitis last year on CT, unfortunately treatment for possible diverticulitis not helpful. Not sure what is driving her pain at this point. She does warrant a colonoscopy, I see that is scheduled for April, but if she is still having severe pain I think repeat CT abdomen / pelvis with IV contrast is reasonable at this time. Can you please order for her. We can try some bentyl 10mg  every 8 hours PRN in the interim to see if that helps. Thanks

## 2020-02-17 NOTE — Telephone Encounter (Signed)
Left message to please call back. °

## 2020-02-17 NOTE — Telephone Encounter (Signed)
Called patient back and she states she is still having LLQ pain. No fever, no blood, BMs normal, waves of nausea. She finished the 10 day course of Augmentin that Amy Esterwood PA gave her, but said it made no difference in the pain. She does have bursitis that has flared up in her left hip that she got a cortisone shot for by her PCP. Said she didn't know if the pain of that, being in the same area could be related ?

## 2020-02-18 ENCOUNTER — Encounter
Admission: RE | Admit: 2020-02-18 | Discharge: 2020-02-18 | Disposition: A | Discharge status: Home / Self Care | Payer: Medicare Other | Source: Ambulatory Visit | Attending: Primary Care | Admitting: Primary Care

## 2020-02-18 ENCOUNTER — Telehealth: Payer: Self-pay

## 2020-02-18 ENCOUNTER — Other Ambulatory Visit: Payer: Self-pay

## 2020-02-18 DIAGNOSIS — R61 Generalized hyperhidrosis: Secondary | ICD-10-CM | POA: Insufficient documentation

## 2020-02-18 DIAGNOSIS — D352 Benign neoplasm of pituitary gland: Secondary | ICD-10-CM | POA: Diagnosis not present

## 2020-02-18 DIAGNOSIS — R748 Abnormal levels of other serum enzymes: Secondary | ICD-10-CM | POA: Insufficient documentation

## 2020-02-18 DIAGNOSIS — Z86018 Personal history of other benign neoplasm: Secondary | ICD-10-CM | POA: Diagnosis not present

## 2020-02-18 MED ORDER — TECHNETIUM TC 99M MEDRONATE IV KIT
20.0000 | PACK | Freq: Once | INTRAVENOUS | Status: AC | PRN
  Administered 2020-02-18: 22.476 via INTRAVENOUS

## 2020-02-18 NOTE — Telephone Encounter (Signed)
-----   Message from [place], Generic sent at 02/17/2020  5:30 PM EST ----- Regarding: Your message may not be read Contact: [phone]    ----- Delivery failure of internet email alert  Tickler type: Message Message Id(WMG): [id number] SMTP Response: [id number] Patient: Denise [name], JERVEY [id number]) Internet alert email: [email or web address]     ----- Original WMG message to the patient ----- Sent: 02/17/2020  4:38 PM From: Hughie Closs, RN To: [facility] Message Type: User Message Subject: CT-abdomin/pelvis Dear [name], Your CT has been scheduled at [facility] on 02/24/20. Please arrive at 3:45pm. You will need to pick-up 2 bottles of contrast at [facility] Radiology before 02/24/20 and drink the 1st bottle at 2:00pm and the 2nd bottle at 3:00pm the day of your CT. Please be NPO after 12:00 noon on the day of your CT, except for drinking the contrast. If you have any questions about this, please call us at [phone]. Denna Haggard RN

## 2020-02-18 NOTE — Telephone Encounter (Signed)
Received message that the MyChart message did not go through. I called patient and gave the CT information by phone

## 2020-02-19 ENCOUNTER — Other Ambulatory Visit: Payer: Self-pay | Admitting: Primary Care

## 2020-02-19 DIAGNOSIS — I10 Essential (primary) hypertension: Secondary | ICD-10-CM

## 2020-02-24 ENCOUNTER — Other Ambulatory Visit: Payer: Self-pay | Admitting: Primary Care

## 2020-02-24 ENCOUNTER — Ambulatory Visit (HOSPITAL_COMMUNITY): Payer: Medicare Other

## 2020-02-24 DIAGNOSIS — R748 Abnormal levels of other serum enzymes: Secondary | ICD-10-CM

## 2020-02-25 ENCOUNTER — Other Ambulatory Visit: Payer: Medicare Other

## 2020-02-25 ENCOUNTER — Other Ambulatory Visit (INDEPENDENT_AMBULATORY_CARE_PROVIDER_SITE_OTHER): Payer: Medicare Other

## 2020-02-25 ENCOUNTER — Other Ambulatory Visit: Payer: Self-pay

## 2020-02-25 DIAGNOSIS — R748 Abnormal levels of other serum enzymes: Secondary | ICD-10-CM | POA: Diagnosis not present

## 2020-03-01 LAB — ALKALINE PHOSPHATASE ISOENZYMES
Alkaline phosphatase (APISO): 197 U/L — ABNORMAL HIGH (ref 37–153)
Bone Isoenzymes: 32 % (ref 28–66)
Intestinal Isoenzymes: 22 % (ref 1–24)
Liver Isoenzymes: 46 % (ref 25–69)

## 2020-03-04 ENCOUNTER — Encounter: Payer: Medicare Other | Admitting: Gastroenterology

## 2020-03-15 ENCOUNTER — Ambulatory Visit
Admission: RE | Admit: 2020-03-15 | Discharge: 2020-03-15 | Disposition: A | Discharge status: Home / Self Care | Payer: Medicare Other | Source: Ambulatory Visit | Attending: Primary Care | Admitting: Primary Care

## 2020-03-15 ENCOUNTER — Other Ambulatory Visit: Payer: Self-pay

## 2020-03-15 DIAGNOSIS — Z86018 Personal history of other benign neoplasm: Secondary | ICD-10-CM

## 2020-03-15 DIAGNOSIS — D352 Benign neoplasm of pituitary gland: Secondary | ICD-10-CM | POA: Diagnosis not present

## 2020-03-15 DIAGNOSIS — E237 Disorder of pituitary gland, unspecified: Secondary | ICD-10-CM | POA: Diagnosis not present

## 2020-03-15 MED ORDER — GADOBENATE DIMEGLUMINE 529 MG/ML IV SOLN
10.0000 mL | Freq: Once | INTRAVENOUS | Status: AC | PRN
  Administered 2020-03-15: 10 mL via INTRAVENOUS

## 2020-03-22 ENCOUNTER — Ambulatory Visit: Payer: Medicare Other | Admitting: Primary Care

## 2020-03-22 DIAGNOSIS — Z0289 Encounter for other administrative examinations: Secondary | ICD-10-CM

## 2020-03-31 ENCOUNTER — Ambulatory Visit: Payer: Medicare Other | Admitting: Dermatology

## 2020-04-06 ENCOUNTER — Encounter: Payer: Medicare Other | Admitting: Gastroenterology

## 2020-04-15 ENCOUNTER — Other Ambulatory Visit: Payer: Self-pay | Admitting: Primary Care

## 2020-04-15 DIAGNOSIS — E785 Hyperlipidemia, unspecified: Secondary | ICD-10-CM

## 2020-04-15 DIAGNOSIS — E039 Hypothyroidism, unspecified: Secondary | ICD-10-CM

## 2020-04-15 IMAGING — CT CT ABD-PELV W/ CM
2 of 5 series · 16 of 46 positions shown, 18 images · IV contrast (APPLIED)
Comparison: None.

CLINICAL DATA: Diarrhea and rectal bleeding

EXAM:
CT ABDOMEN AND PELVIS WITH CONTRAST
TECHNIQUE: Multidetector CT imaging of the abdomen and pelvis was performed
using the standard protocol following bolus administration of
intravenous contrast.
CONTRAST:  100mL MSI9TH-4QQ IOPAMIDOL (MSI9TH-[id number]) INJECTION 61%

[Series 2: routine abd/pel with · axial · 0.76mm/px · z∈[-1074,-644]mm · 13 of 98 slices shown, 15 images]
[im 6/98  soft-tissue]
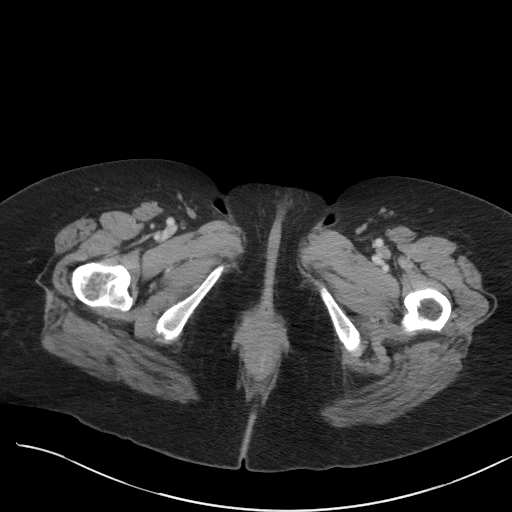
[im 6/98  bone]
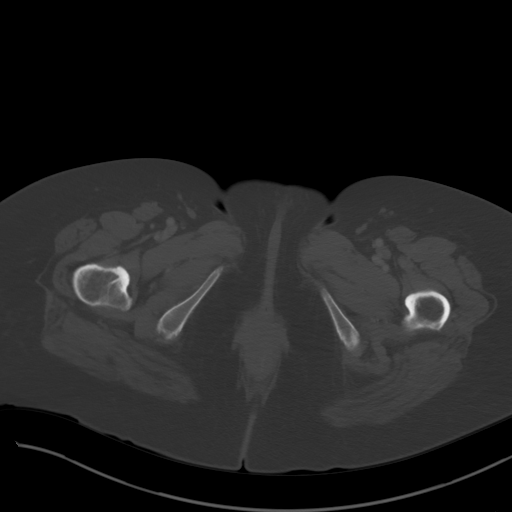
[im 16/98  soft-tissue]
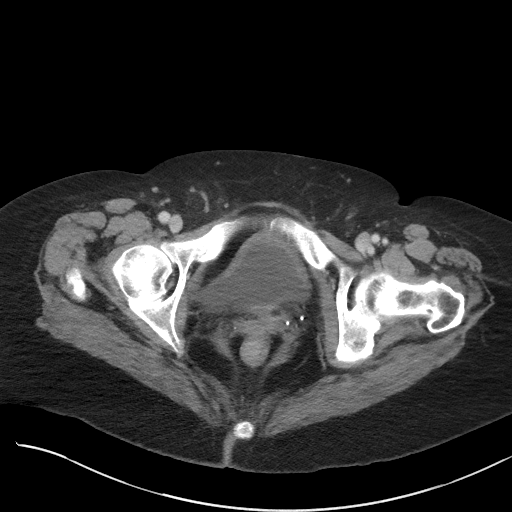
[im 21/98  soft-tissue]
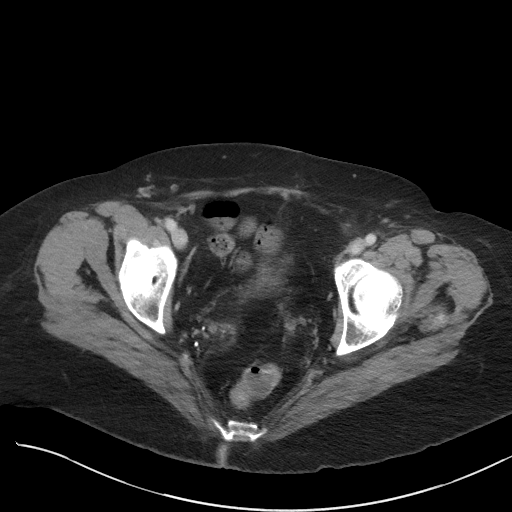
[im 26/98  soft-tissue]
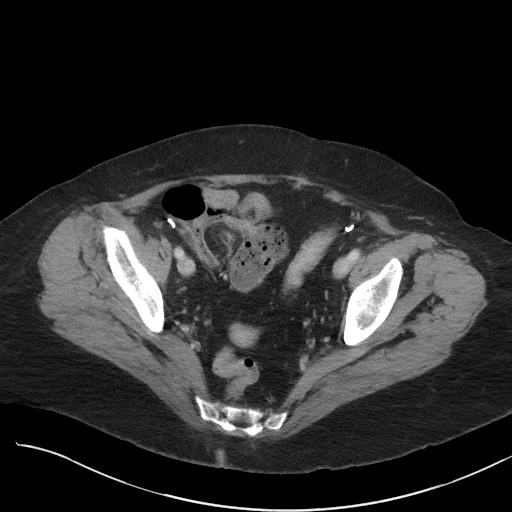
[im 36/98  soft-tissue]
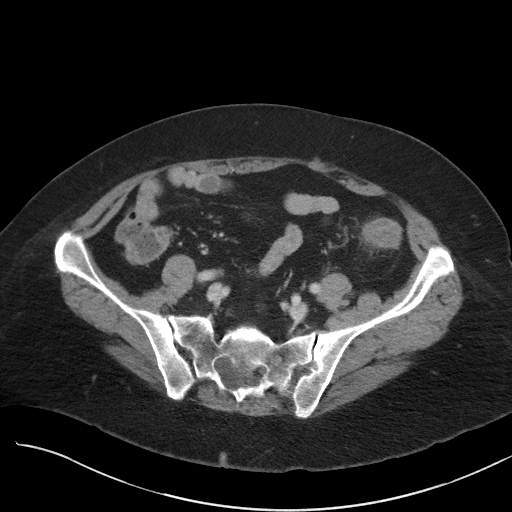
[im 41/98  soft-tissue]
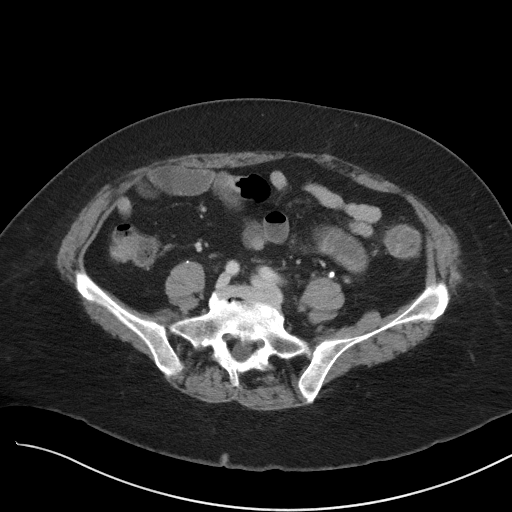
[im 52/98  soft-tissue]
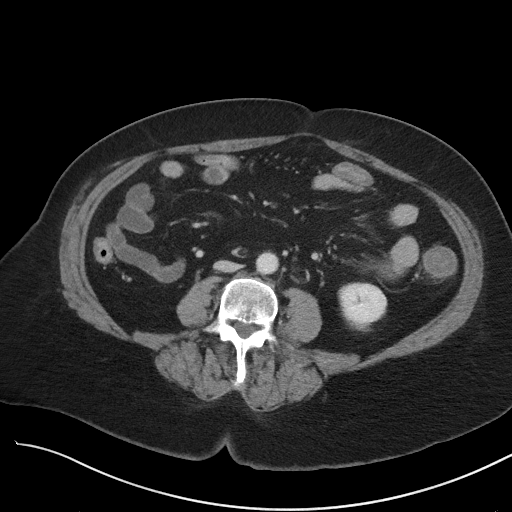
[im 57/98  soft-tissue]
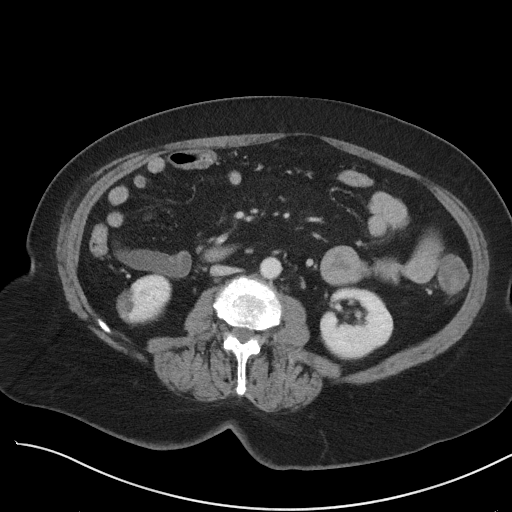
[im 62/98  soft-tissue]
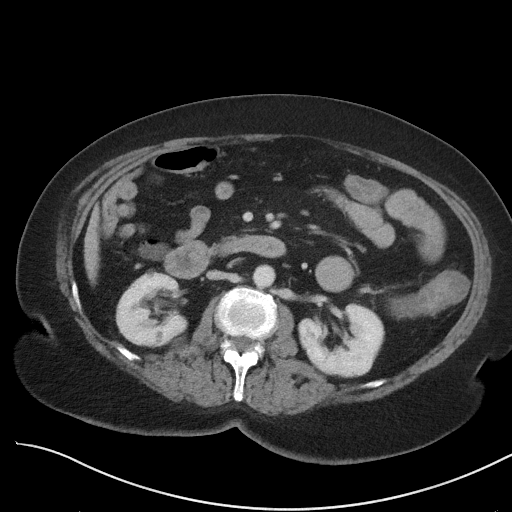
[im 62/98  bone]
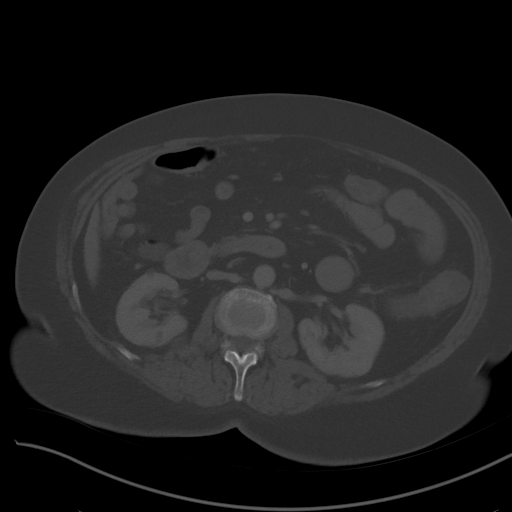
[im 72/98  soft-tissue]
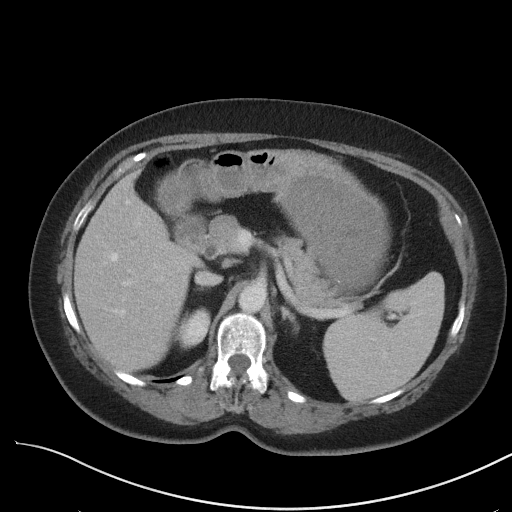
[im 77/98  soft-tissue]
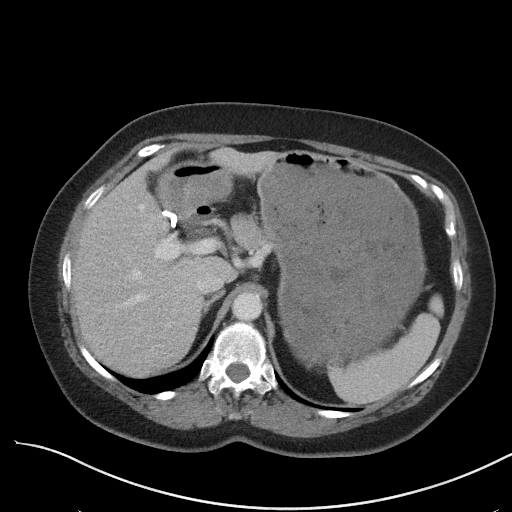
[im 82/98  soft-tissue]
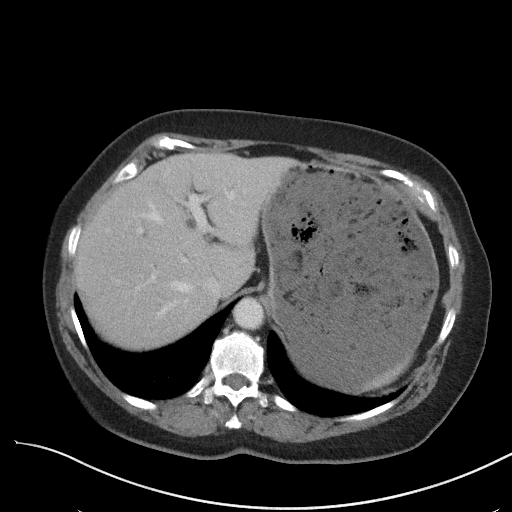
[im 92/98  soft-tissue]
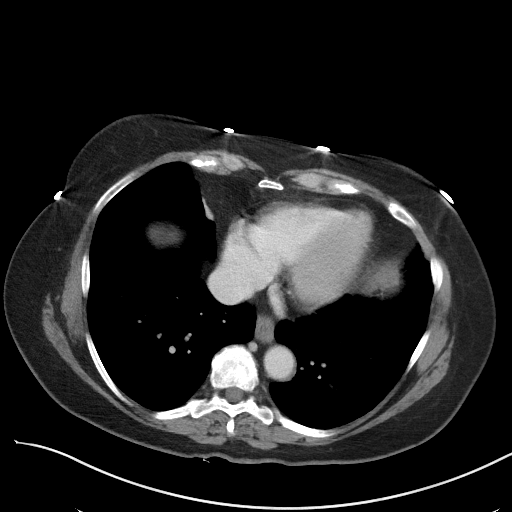

[Series 5: coronal st · coronal · 0.77mm/px · 3 of 94 slices shown]
[im 32/94  soft-tissue]
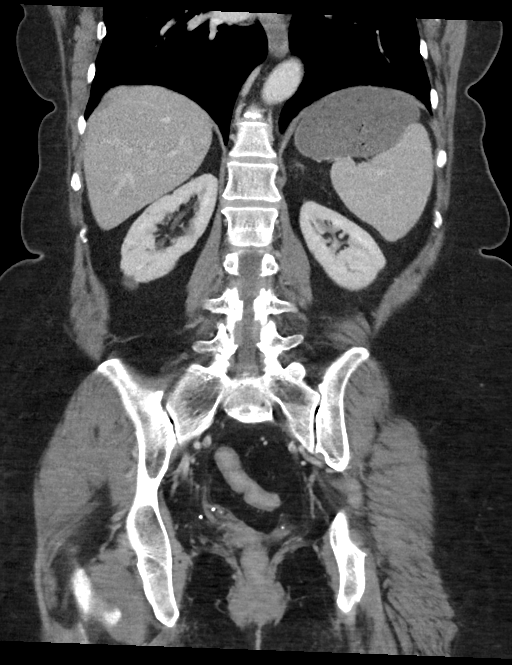
[im 42/94  soft-tissue]
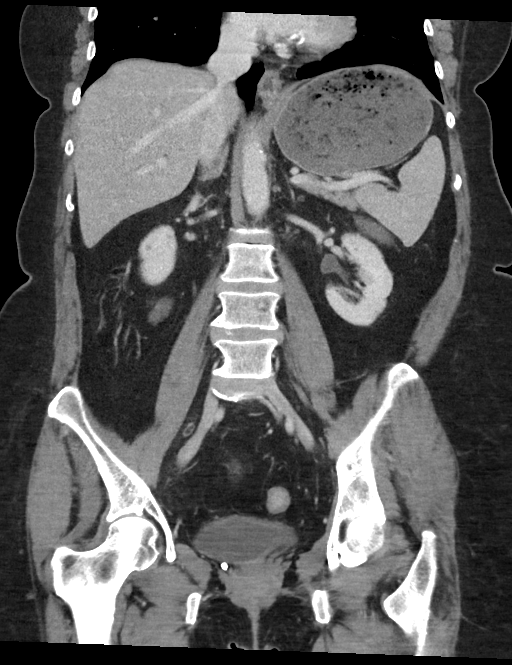
[im 52/94  soft-tissue]
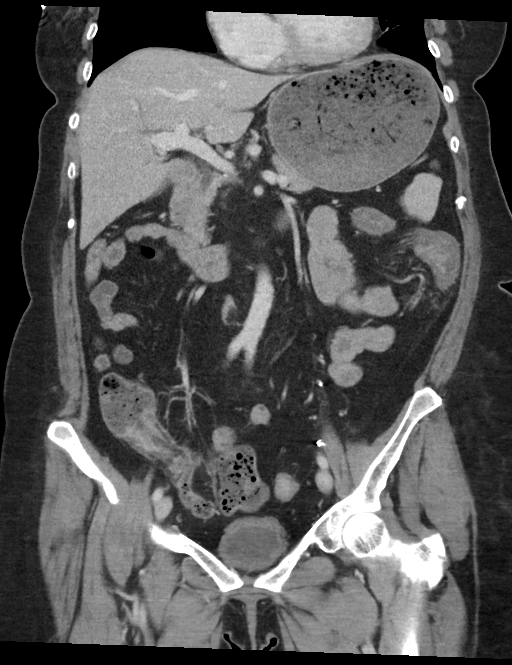

[16 of 46 positions shown; findings below may reference images not displayed]

FINDINGS: Lower chest:  No contributory findings.

Hepatobiliary: No focal liver abnormality.Cholecystectomy with
normal common bile duct diameter

Pancreas: Unremarkable.

Spleen: Unremarkable.

Adrenals/Urinary Tract: Negative adrenals. No hydronephrosis or
stone. Renal cystic densities and other low densities that are too
small for densitometry. Unremarkable bladder when accounting for
under distention.

Stomach/Bowel: Submucosal low-density thickening of the left colon
with normal rectum. The appendix is not clearly identified. No
pericecal inflammation . Moderately distended stomach without
inflammatory changes.

Vascular/Lymphatic: Ischemic colitis is considered given the
vascular distribution. Major arterial and venous structures are
patent, including the IMA, IMV, and left colic vein. No mass or
adenopathy.

Reproductive:Hysterectomy.

Other: No ascites or pneumoperitoneum.

Musculoskeletal: No acute abnormalities.
IMPRESSION: Colitis centered at the descending colon.

## 2020-04-18 ENCOUNTER — Ambulatory Visit (INDEPENDENT_AMBULATORY_CARE_PROVIDER_SITE_OTHER): Payer: Medicare Other | Admitting: Primary Care

## 2020-04-18 ENCOUNTER — Encounter: Payer: Self-pay | Admitting: Primary Care

## 2020-04-18 ENCOUNTER — Other Ambulatory Visit: Payer: Self-pay

## 2020-04-18 VITALS — BP 146/80 | HR 80 | Temp 96.6°F | Ht 64.5 in | Wt 217.5 lb

## 2020-04-18 DIAGNOSIS — I10 Essential (primary) hypertension: Secondary | ICD-10-CM

## 2020-04-18 DIAGNOSIS — R079 Chest pain, unspecified: Secondary | ICD-10-CM

## 2020-04-18 DIAGNOSIS — N644 Mastodynia: Secondary | ICD-10-CM | POA: Diagnosis not present

## 2020-04-18 HISTORY — DX: Mastodynia: N64.4

## 2020-04-18 MED ORDER — AMLODIPINE BESYLATE 5 MG PO TABS: 5.0000 mg | ORAL_TABLET | Freq: Every day | ORAL | 0 refills | Status: DC

## 2020-04-18 NOTE — Progress Notes (Signed)
Subjective:    Patient ID: Denise [name], female    DOB: 10/12/1951, 69 y.o.   MRN: [id number]  HPI  This visit occurred during the SARS-CoV-2 public health emergency.  Safety protocols were in place, including screening questions prior to the visit, additional usage of staff PPE, and extensive cleaning of exam room while observing appropriate contact time as indicated for disinfecting solutions.   Denise [name] is a 69 year old female with a history of migraine, hypertension, GERD, CVA, LBBB, hypothyroidism, elevated Alk Phos, prediabetes, who presents today to discuss elevated blood pressure readings.  She is currently managed on losartan 100 mg and metoprolol succinate 25 mg. She is checking her BP at home and is getting reading ranging 120's-150's/80's-90's.  Her home BP readings today was 154/91 and 146/87. She has noticed symptoms of headache, lightheadedness.   She has noticed left sided chest pain intermittently for months. She had an episode of left sided "dull" chest pain while laying on the sofa watching TV two weeks ago. She had radiation down her left upper extremity, pain lasted about 30 minutes. Chest pain mostly occurs during rest. She did not have symptoms of nausea, GI upset, diaphoresis. She does not see cardiology.   Also with left sided upper breast tenderness that is reproducible upon palpation. Different type of pain that her chest pain mentioned above. She underwent a screening mammogram in June 2020 which was negative.   BP Readings from Last 3 Encounters:  04/18/20 (!) 146/80  01/25/20 130/80  01/21/20 140/90     Review of Systems  Constitutional: Negative for fatigue.  Eyes: Negative for visual disturbance.  Respiratory: Negative for shortness of breath.   Cardiovascular: Positive for chest pain.  Gastrointestinal: Negative for abdominal pain and nausea.  Genitourinary:       Breast tenderness  Neurological: Positive for light-headedness and  headaches.       Past Medical History:  Diagnosis Date  . Allergy    seasonal  . Arthritis   . Bursitis of hip    Left  . Constipation    90%of the time c/o constipation, occ has diarrhea - uses stool softener   . Endometriosis   . Facial flushing 05/12/2014  . Fibroid   . GERD (gastroesophageal reflux disease)   . Hematochezia   . Hiatal hernia   . HSV-1 (herpes simplex virus 1) infection   . Hyperlipidemia   . Hyperplastic colon polyp   . Hypertension   . Hypothyroidism   . Ischemic colitis ([place]) 01/2019  . LBBB (left bundle branch block)   . Low back pain   . Migraine   . Osteopenia 2008   -1.2 FEMORAL NECK  . Prediabetes   . Stroke [facility])    TIA  . TIA (transient ischemic attack) 08-10-2013     Social History   Socioeconomic History  . Marital status: Divorced    Spouse name: Not on file  . Number of children: 2  . Years of education: Not on file  . Highest education level: Not on file  Occupational History    Employer: RETIRED  Tobacco Use  . Smoking status: Never Smoker  . Smokeless tobacco: Never Used  Substance and Sexual Activity  . Alcohol use: Yes    Alcohol/week: 0.0 standard drinks    Comment: 1 every 3 months  . Drug use: No  . Sexual activity: Not Currently    Comment: intercourse age 29, more than 5 sexual partners,des neg  Other  Topics Concern  . Not on file  Social History Narrative   Single.   2 daughters, 4 grandchildren.   Once worked for [employer].   Enjoys Ambulance person.    Social Determinants of Health   Financial Resource Strain:   . Difficulty of Paying Living Expenses:   Food Insecurity:   . Worried About Charity fundraiser in the Last Year:   . Arboriculturist in the Last Year:   Transportation Needs:   . Film/video editor (Medical):   Marland Kitchen Lack of Transportation (Non-Medical):   Physical Activity:   . Days of Exercise per Week:   . Minutes of Exercise per Session:   Stress:   .  Feeling of Stress :   Social Connections:   . Frequency of Communication with Friends and Family:   . Frequency of Social Gatherings with Friends and Family:   . Attends Religious Services:   . Active Member of Clubs or Organizations:   . Attends Archivist Meetings:   Marland Kitchen Marital Status:   Intimate Partner Violence:   . Fear of Current or Ex-Partner:   . Emotionally Abused:   Marland Kitchen Physically Abused:   . Sexually Abused:     Past Surgical History:  Procedure Laterality Date  . ABDOMINAL HYSTERECTOMY  1989   TAH, [place]  2003  . COLONOSCOPY    . KNEE SURGERY  2004   right  . NASAL SEPTUM SURGERY  2012  . PITUITARY SURGERY  2001  . POLYPECTOMY    . SHOULDER SURGERY  2007   right  . THYROID LOBECTOMY  2002    Family History  Problem Relation Age of Onset  . Heart disease Father   . Cancer Mother        brain tumor  . Breast cancer Maternal Aunt   . Parkinson's disease Sister   . Colon cancer Neg Hx   . Colon polyps Neg Hx   . Rectal cancer Neg Hx   . Stomach cancer Neg Hx   . Thyroid disease Neg Hx   . Esophageal cancer Neg Hx     Allergies  Allergen Reactions  . Tramadol Other (See Comments)    Reaction: NIGHTMARES    Current Outpatient Medications on File Prior to Visit  Medication Sig Dispense Refill  . aspirin 81 MG tablet Take 81 mg by mouth daily.    Marland Kitchen atorvastatin (LIPITOR) 10 MG tablet TAKE 1 TABLET BY MOUTH  DAILY 90 tablet 3  . cetirizine (ZYRTEC) 10 MG tablet Take 10 mg by mouth as needed for allergies. Reported on 05/28/2016    . diclofenac sodium (VOLTAREN) 1 % GEL Apply 1 g topically 4 (four) times daily as needed for pain.    Marland Kitchen dicyclomine (BENTYL) 10 MG capsule Take 1 capsule (10 mg total) by mouth every 8 (eight) hours as needed for spasms. 60 capsule 1  . DULoxetine (CYMBALTA) 60 MG capsule TAKE 1 CAPSULE BY MOUTH  DAILY FOR HEADACHE  PREVENTION 90 capsule 1  . esomeprazole (NEXIUM) 20 MG capsule Take 1 capsule (20 mg  total) by mouth every morning. 30 capsule 11  . levothyroxine (SYNTHROID) 75 MCG tablet TAKE 1 TABLET BY MOUTH IN  THE MORNING ON AN EMPTY  STOMACH WITH WATER ONLY (NO FOOD OR OTHER MEDICATIONS   FOR 30 MINUTES) 90 tablet 3  . losartan (COZAAR) 100 MG tablet Take 1 tablet (100 mg total) by mouth daily.  For blood pressure. 90 tablet 3  . metoprolol succinate (TOPROL-XL) 25 MG 24 hr tablet TAKE 1 TABLET BY MOUTH  DAILY 90 tablet 1  . naratriptan (AMERGE) 2.5 MG tablet TAKE 1 TABLET BY MOUTH AS NEEDED FOR MIGRAINE. MAY REPEAT IN 4 HOURS IF SYMPTOMS PERSIST. MAX 2 TABLETS IN 24 HOURS 20 tablet 0  . Polyethylene Glycol 3350 (MIRALAX PO) Take by mouth. 119 grams for colon 2 day prep    . amoxicillin-clavulanate (AUGMENTIN) 875-125 MG tablet Take 1 tablet by mouth 2 (two) times daily. (Patient not taking: Reported on 01/25/2020) 20 tablet 0   No current facility-administered medications on file prior to visit.    BP (!) 146/80   Pulse 80   Temp (!) 96.6 F (35.9 C) (Temporal)   Ht 5' 4.5" (1.638 m)   Wt 217 lb 8 oz (98.7 kg)   SpO2 98%   BMI 36.76 kg/m    Objective:   Physical Exam  Constitutional: She appears well-nourished.  Cardiovascular: Normal rate and regular rhythm.  Respiratory: Effort normal and breath sounds normal. Right breast exhibits no mass and no skin change. Left breast exhibits mass and tenderness. Left breast exhibits no skin change.    Small, soft, immobile mass to left breast at 10-11 o'clock position. Tender.   Musculoskeletal:     Cervical back: Neck supple.  Skin: Skin is warm and dry.  Psychiatric: She has a normal mood and affect.           Assessment & Plan:

## 2020-04-18 NOTE — Assessment & Plan Note (Signed)
Acute with slight mass with tenderness on exam. Orders for diagnostic mammogram and [place] placed.

## 2020-04-18 NOTE — Assessment & Plan Note (Signed)
Intermittent for months. Given history of TIA, obesity, hyperlipidemia will send to cardiology for evaluation.   ECG today with NSR with rate of 73, LBBB, no ST changes. Appears very similar to ECG from 2018.  Labs reviewed from February 2021

## 2020-04-18 NOTE — Patient Instructions (Signed)
You will be contacted regarding your referral to cardiology and for the mammogram.  Please let us know if you have not been contacted within two weeks.   Start amlodipine 5 mg once daily for blood pressure. Continue losartan 100 mg and metoprolol succinate 25 mg.   Continue to monitor your blood pressure.  Schedule a follow up visit for 2 weeks for blood pressure check.  It was a pleasure to see you today!

## 2020-04-18 NOTE — Assessment & Plan Note (Signed)
Above goal in the office today, also with home readings. Start amlodipine 5 mg daily. Continue losartan 100 mg and metoprolol succinate 25 mg.   We will plan to see her back in the office in 2 weeks for BP check, or she will be seen by cardiology for BP check.

## 2020-04-27 ENCOUNTER — Ambulatory Visit
Admission: RE | Admit: 2020-04-27 | Discharge: 2020-04-27 | Disposition: A | Discharge status: Home / Self Care | Payer: Medicare Other | Source: Ambulatory Visit | Attending: Primary Care | Admitting: Primary Care

## 2020-04-27 ENCOUNTER — Other Ambulatory Visit: Payer: Self-pay

## 2020-04-27 ENCOUNTER — Ambulatory Visit: Payer: Medicare Other

## 2020-04-27 ENCOUNTER — Other Ambulatory Visit: Payer: Self-pay | Admitting: Primary Care

## 2020-04-27 DIAGNOSIS — N6320 Unspecified lump in the left breast, unspecified quadrant: Secondary | ICD-10-CM | POA: Diagnosis not present

## 2020-04-27 DIAGNOSIS — N644 Mastodynia: Secondary | ICD-10-CM

## 2020-04-27 DIAGNOSIS — R928 Other abnormal and inconclusive findings on diagnostic imaging of breast: Secondary | ICD-10-CM | POA: Diagnosis not present

## 2020-05-30 ENCOUNTER — Other Ambulatory Visit: Payer: Self-pay

## 2020-05-30 ENCOUNTER — Encounter: Payer: Self-pay | Admitting: Primary Care

## 2020-05-30 ENCOUNTER — Ambulatory Visit (INDEPENDENT_AMBULATORY_CARE_PROVIDER_SITE_OTHER): Payer: Medicare Other | Admitting: Primary Care

## 2020-05-30 VITALS — BP 144/80 | HR 78 | Temp 96.8°F | Ht 64.5 in | Wt 215.8 lb

## 2020-05-30 DIAGNOSIS — I10 Essential (primary) hypertension: Secondary | ICD-10-CM | POA: Diagnosis not present

## 2020-05-30 DIAGNOSIS — R7303 Prediabetes: Secondary | ICD-10-CM

## 2020-05-30 DIAGNOSIS — R61 Generalized hyperhidrosis: Secondary | ICD-10-CM | POA: Diagnosis not present

## 2020-05-30 DIAGNOSIS — Z1159 Encounter for screening for other viral diseases: Secondary | ICD-10-CM

## 2020-05-30 DIAGNOSIS — E039 Hypothyroidism, unspecified: Secondary | ICD-10-CM

## 2020-05-30 LAB — TSH: TSH: 2.01 u[IU]/mL (ref 0.35–4.50)

## 2020-05-30 LAB — HEPATIC FUNCTION PANEL
ALT: 9 U/L (ref 0–35)
AST: 13 U/L (ref 0–37)
Albumin: 4 g/dL (ref 3.5–5.2)
Alkaline Phosphatase: 204 U/L — ABNORMAL HIGH (ref 39–117)
Bilirubin, Direct: 0.1 mg/dL (ref 0.0–0.3)
Total Bilirubin: 0.4 mg/dL (ref 0.2–1.2)
Total Protein: 6.7 g/dL (ref 6.0–8.3)

## 2020-05-30 LAB — CBC
HCT: 38.3 % (ref 36.0–46.0)
Hemoglobin: 12.7 g/dL (ref 12.0–15.0)
MCHC: 33.3 g/dL (ref 30.0–36.0)
MCV: 83.4 fl (ref 78.0–100.0)
Platelets: 301 10*3/uL (ref 150.0–400.0)
RBC: 4.59 Mil/uL (ref 3.87–5.11)
RDW: 13.4 % (ref 11.5–15.5)
WBC: 7.7 10*3/uL (ref 4.0–10.5)

## 2020-05-30 LAB — HEMOGLOBIN A1C: Hgb A1c MFr Bld: 6.3 % (ref 4.6–6.5)

## 2020-05-30 MED ORDER — AMLODIPINE BESYLATE 10 MG PO TABS: 10.0000 mg | ORAL_TABLET | Freq: Every day | ORAL | 0 refills | Status: DC

## 2020-05-30 MED ORDER — GLYCOPYRROLATE 1 MG PO TABS: ORAL_TABLET | ORAL | 0 refills | Status: DC

## 2020-05-30 NOTE — Patient Instructions (Addendum)
We've increased your amlodipine dose to 10 mg. I sent a new prescription to your pharmacy.  Stop by the lab prior to leaving today. I will notify you of your results once received.   Start glycopyrrolate 1 mg tablets for sweating. Take 1 tablet by mouth once or twice daily.   You will be contacted regarding your referral to dermatology.  Please let us know if you have not been contacted within two weeks.   Schedule a follow up visit for 2-3 weeks for blood pressure check.  It was a pleasure to see you today!

## 2020-05-30 NOTE — Assessment & Plan Note (Signed)
Repeat A1C pending.  Discussed the importance of a healthy diet and regular exercise in order for weight loss, and to reduce the risk of any potential medical problems.  

## 2020-05-30 NOTE — Progress Notes (Signed)
Subjective:    Patient ID: Denise [name], female    DOB: 07-09-1951, 69 y.o.   MRN: [id number]  HPI  This visit occurred during the SARS-CoV-2 public health emergency.  Safety protocols were in place, including screening questions prior to the visit, additional usage of staff PPE, and extensive cleaning of exam room while observing appropriate contact time as indicated for disinfecting solutions.   Ms. Fenster is a 69 year old female with a history of migraines, hypertension, hypothyroidism, benign pituitary adenoma, excessive sweating, hyperlipidemia who presents today with multiple symptoms.  Symptoms include distorted vision, hair loss, elevated blood pressure readings. She can pull out handfuls of hair when brushing, washing, and running her hands through her hair. She has chronic hair thinning. She continues to sweat excessively. Her scalp will get very "hot" and then sweating will occur to her head, she'll then feel flushing down the entire body. Sweating occurs with rest, mild, moderate exertion. Symptoms began about three weeks ago, excluding excessive sweating that began years ago.  She has been checking her blood pressure at home recently, has been getting readings of 140's-150's/80's. She denies headaches, chest pain. She was referred to cardiology in early May due to symptoms of chest pain. She has yet to set up a visit.   She is compliant to her amlodipine 5 mg, metoprolol succinate XL 25 mg, and losartan 100 mg.   BP Readings from Last 3 Encounters:  05/30/20 (!) 144/80  04/18/20 (!) 146/80  01/25/20 130/80     Review of Systems  Constitutional: Positive for diaphoresis.  Eyes: Positive for visual disturbance.  Respiratory: Negative for shortness of breath.   Cardiovascular: Negative for chest pain.  Skin:       Excessive sweating, hair thinning  Neurological: Negative for dizziness and headaches.       Past Medical History:  Diagnosis Date  . Allergy     seasonal  . Arthritis   . Bursitis of hip    Left  . Constipation    90%of the time c/o constipation, occ has diarrhea - uses stool softener   . Endometriosis   . Facial flushing 05/12/2014  . Fibroid   . GERD (gastroesophageal reflux disease)   . Hematochezia   . Hiatal hernia   . HSV-1 (herpes simplex virus 1) infection   . Hyperlipidemia   . Hyperplastic colon polyp   . Hypertension   . Hypothyroidism   . Ischemic colitis ([place]) 01/2019  . LBBB (left bundle branch block)   . Low back pain   . Migraine   . Osteopenia 2008   -1.2 FEMORAL NECK  . Prediabetes   . Stroke [facility])    TIA  . TIA (transient ischemic attack) 08-10-2013     Social History   Socioeconomic History  . Marital status: Divorced    Spouse name: Not on file  . Number of children: 2  . Years of education: Not on file  . Highest education level: Not on file  Occupational History    Employer: RETIRED  Tobacco Use  . Smoking status: Never Smoker  . Smokeless tobacco: Never Used  Vaping Use  . Vaping Use: Never used  Substance and Sexual Activity  . Alcohol use: Yes    Alcohol/week: 0.0 standard drinks    Comment: 1 every 3 months  . Drug use: No  . Sexual activity: Not Currently    Comment: intercourse age 47, more than 5 sexual partners,des neg  Other Topics Concern  .  Not on file  Social History Narrative   Single.   2 daughters, 4 grandchildren.   Once worked for [employer].   Enjoys Ambulance person.    Social Determinants of Health   Financial Resource Strain:   . Difficulty of Paying Living Expenses:   Food Insecurity:   . Worried About Charity fundraiser in the Last Year:   . Arboriculturist in the Last Year:   Transportation Needs:   . Film/video editor (Medical):   Marland Kitchen Lack of Transportation (Non-Medical):   Physical Activity:   . Days of Exercise per Week:   . Minutes of Exercise per Session:   Stress:   . Feeling of Stress :   Social  Connections:   . Frequency of Communication with Friends and Family:   . Frequency of Social Gatherings with Friends and Family:   . Attends Religious Services:   . Active Member of Clubs or Organizations:   . Attends Archivist Meetings:   Marland Kitchen Marital Status:   Intimate Partner Violence:   . Fear of Current or Ex-Partner:   . Emotionally Abused:   Marland Kitchen Physically Abused:   . Sexually Abused:     Past Surgical History:  Procedure Laterality Date  . ABDOMINAL HYSTERECTOMY  1989   TAH, [place]  2003  . COLONOSCOPY    . KNEE SURGERY  2004   right  . NASAL SEPTUM SURGERY  2012  . PITUITARY SURGERY  2001  . POLYPECTOMY    . SHOULDER SURGERY  2007   right  . THYROID LOBECTOMY  2002    Family History  Problem Relation Age of Onset  . Heart disease Father   . Cancer Mother        brain tumor  . Breast cancer Maternal Aunt   . Parkinson's disease Sister   . Colon cancer Neg Hx   . Colon polyps Neg Hx   . Rectal cancer Neg Hx   . Stomach cancer Neg Hx   . Thyroid disease Neg Hx   . Esophageal cancer Neg Hx     Allergies  Allergen Reactions  . Tramadol Other (See Comments)    Reaction: NIGHTMARES    Current Outpatient Medications on File Prior to Visit  Medication Sig Dispense Refill  . aspirin 81 MG tablet Take 81 mg by mouth daily.    Marland Kitchen atorvastatin (LIPITOR) 10 MG tablet TAKE 1 TABLET BY MOUTH  DAILY 90 tablet 3  . cetirizine (ZYRTEC) 10 MG tablet Take 10 mg by mouth as needed for allergies. Reported on 05/28/2016    . diclofenac sodium (VOLTAREN) 1 % GEL Apply 1 g topically 4 (four) times daily as needed for pain.    Marland Kitchen dicyclomine (BENTYL) 10 MG capsule Take 1 capsule (10 mg total) by mouth every 8 (eight) hours as needed for spasms. 60 capsule 1  . DULoxetine (CYMBALTA) 60 MG capsule TAKE 1 CAPSULE BY MOUTH  DAILY FOR HEADACHE  PREVENTION 90 capsule 1  . esomeprazole (NEXIUM) 20 MG capsule Take 1 capsule (20 mg total) by mouth every morning. 30  capsule 11  . levothyroxine (SYNTHROID) 75 MCG tablet TAKE 1 TABLET BY MOUTH IN  THE MORNING ON AN EMPTY  STOMACH WITH WATER ONLY (NO FOOD OR OTHER MEDICATIONS   FOR 30 MINUTES) 90 tablet 3  . losartan (COZAAR) 100 MG tablet Take 1 tablet (100 mg total) by mouth daily. For blood pressure. [place]  tablet 3  . metoprolol succinate (TOPROL-XL) 25 MG 24 hr tablet TAKE 1 TABLET BY MOUTH  DAILY 90 tablet 1  . naratriptan (AMERGE) 2.5 MG tablet TAKE 1 TABLET BY MOUTH AS NEEDED FOR MIGRAINE. MAY REPEAT IN 4 HOURS IF SYMPTOMS PERSIST. MAX 2 TABLETS IN 24 HOURS 20 tablet 0  . Polyethylene Glycol 3350 (MIRALAX PO) Take by mouth. 119 grams for colon 2 day prep    . amoxicillin-clavulanate (AUGMENTIN) 875-125 MG tablet Take 1 tablet by mouth 2 (two) times daily. (Patient not taking: Reported on 01/25/2020) 20 tablet 0   No current facility-administered medications on file prior to visit.    BP (!) 144/80   Pulse 78   Temp (!) 96.8 F (36 C) (Temporal)   Ht 5' 4.5" (1.638 m)   Wt 215 lb 12 oz (97.9 kg)   SpO2 98%   BMI 36.46 kg/m    Objective:   Physical Exam  Cardiovascular: Normal rate and regular rhythm.  Respiratory: Effort normal and breath sounds normal.  Musculoskeletal:     Cervical back: Neck supple.  Skin: Skin is warm.  Mild sweating noted to forehead and chest.            Assessment & Plan:

## 2020-05-30 NOTE — Assessment & Plan Note (Signed)
Above goal in the office today despite the addition of amlodipine 5 mg.   Increase amlodipine to 10 mg, continue losartan and metoprolol succinate. We will plan to see her back in 2-3 weeks for blood pressure check.

## 2020-05-30 NOTE — Assessment & Plan Note (Signed)
Repeat TSH pending given hair thinning symptoms.

## 2020-05-30 NOTE — Telephone Encounter (Signed)
Denise [name], see her message. Thanks.

## 2020-05-30 NOTE — Assessment & Plan Note (Signed)
Continued, all work up without obvious cause. She is ready to trial oral treatment. Rx for glycopyrrolate sent to pharmacy.   Referral placed to dermatology.

## 2020-05-31 LAB — HEPATITIS C ANTIBODY
Hepatitis C Ab: NONREACTIVE
SIGNAL TO CUT-OFF: 0.01 (ref <1.00)

## 2020-05-31 LAB — RPR: RPR Ser Ql: NONREACTIVE

## 2020-06-02 ENCOUNTER — Other Ambulatory Visit: Payer: Self-pay | Admitting: Primary Care

## 2020-06-02 DIAGNOSIS — I10 Essential (primary) hypertension: Secondary | ICD-10-CM

## 2020-06-03 NOTE — Telephone Encounter (Signed)
Prior Denise [name] was completed. Waiting on insurance response.

## 2020-06-03 NOTE — Telephone Encounter (Signed)
Denise [name], see message. Will you respond please?

## 2020-06-07 ENCOUNTER — Other Ambulatory Visit: Payer: Self-pay | Admitting: Primary Care

## 2020-06-07 DIAGNOSIS — I10 Essential (primary) hypertension: Secondary | ICD-10-CM

## 2020-06-08 NOTE — Telephone Encounter (Signed)
Anda Kraft,  Sorry. I though I have place the fax regarding the decline in your inbox. It is place there now for you to review.

## 2020-06-21 ENCOUNTER — Ambulatory Visit: Payer: Medicare Other | Admitting: Primary Care

## 2020-07-05 ENCOUNTER — Other Ambulatory Visit: Payer: Self-pay | Admitting: Primary Care

## 2020-07-05 DIAGNOSIS — R519 Headache, unspecified: Secondary | ICD-10-CM

## 2020-07-05 DIAGNOSIS — G43901 Migraine, unspecified, not intractable, with status migrainosus: Secondary | ICD-10-CM

## 2020-07-28 ENCOUNTER — Other Ambulatory Visit: Payer: Self-pay | Admitting: Obstetrics & Gynecology

## 2020-07-28 DIAGNOSIS — E2839 Other primary ovarian failure: Secondary | ICD-10-CM

## 2020-07-28 DIAGNOSIS — Z13820 Encounter for screening for osteoporosis: Secondary | ICD-10-CM

## 2020-08-08 DIAGNOSIS — M519 Unspecified thoracic, thoracolumbar and lumbosacral intervertebral disc disorder: Secondary | ICD-10-CM | POA: Diagnosis not present

## 2020-08-08 DIAGNOSIS — M542 Cervicalgia: Secondary | ICD-10-CM | POA: Diagnosis not present

## 2020-08-16 ENCOUNTER — Other Ambulatory Visit: Payer: Self-pay

## 2020-08-16 DIAGNOSIS — I10 Essential (primary) hypertension: Secondary | ICD-10-CM

## 2020-08-16 MED ORDER — AMLODIPINE BESYLATE 10 MG PO TABS: 10.0000 mg | ORAL_TABLET | Freq: Every day | ORAL | 0 refills | Status: DC

## 2020-08-24 DIAGNOSIS — M5412 Radiculopathy, cervical region: Secondary | ICD-10-CM | POA: Diagnosis not present

## 2020-09-01 DIAGNOSIS — M542 Cervicalgia: Secondary | ICD-10-CM | POA: Diagnosis not present

## 2020-09-01 DIAGNOSIS — M519 Unspecified thoracic, thoracolumbar and lumbosacral intervertebral disc disorder: Secondary | ICD-10-CM | POA: Diagnosis not present

## 2020-09-21 DIAGNOSIS — M545 Low back pain, unspecified: Secondary | ICD-10-CM | POA: Diagnosis not present

## 2020-10-08 DIAGNOSIS — M545 Low back pain, unspecified: Secondary | ICD-10-CM | POA: Diagnosis not present

## 2020-10-14 DIAGNOSIS — R61 Generalized hyperhidrosis: Secondary | ICD-10-CM | POA: Diagnosis not present

## 2020-10-19 DIAGNOSIS — M7062 Trochanteric bursitis, left hip: Secondary | ICD-10-CM | POA: Diagnosis not present

## 2020-10-19 DIAGNOSIS — M545 Low back pain, unspecified: Secondary | ICD-10-CM | POA: Diagnosis not present

## 2020-10-19 DIAGNOSIS — M4727 Other spondylosis with radiculopathy, lumbosacral region: Secondary | ICD-10-CM | POA: Diagnosis not present

## 2020-10-20 DIAGNOSIS — M4727 Other spondylosis with radiculopathy, lumbosacral region: Secondary | ICD-10-CM | POA: Insufficient documentation

## 2020-10-25 DIAGNOSIS — M5030 Other cervical disc degeneration, unspecified cervical region: Secondary | ICD-10-CM | POA: Diagnosis not present

## 2020-11-01 ENCOUNTER — Other Ambulatory Visit: Payer: Self-pay

## 2020-11-01 DIAGNOSIS — I10 Essential (primary) hypertension: Secondary | ICD-10-CM

## 2020-11-02 ENCOUNTER — Other Ambulatory Visit: Payer: Self-pay

## 2020-11-02 ENCOUNTER — Encounter: Payer: Self-pay | Admitting: Primary Care

## 2020-11-02 ENCOUNTER — Ambulatory Visit (INDEPENDENT_AMBULATORY_CARE_PROVIDER_SITE_OTHER): Payer: Medicare Other | Admitting: Primary Care

## 2020-11-02 VITALS — BP 110/72 | HR 99 | Temp 97.5°F | Ht 64.5 in | Wt 213.0 lb

## 2020-11-02 DIAGNOSIS — F419 Anxiety disorder, unspecified: Secondary | ICD-10-CM

## 2020-11-02 DIAGNOSIS — R300 Dysuria: Secondary | ICD-10-CM | POA: Diagnosis not present

## 2020-11-02 DIAGNOSIS — G43009 Migraine without aura, not intractable, without status migrainosus: Secondary | ICD-10-CM

## 2020-11-02 DIAGNOSIS — R519 Headache, unspecified: Secondary | ICD-10-CM | POA: Diagnosis not present

## 2020-11-02 DIAGNOSIS — R61 Generalized hyperhidrosis: Secondary | ICD-10-CM

## 2020-11-02 DIAGNOSIS — F32A Depression, unspecified: Secondary | ICD-10-CM | POA: Insufficient documentation

## 2020-11-02 LAB — POC URINALSYSI DIPSTICK (AUTOMATED)
Blood, UA: POSITIVE
Glucose, UA: NEGATIVE
Ketones, UA: POSITIVE
Nitrite, UA: NEGATIVE
Protein, UA: POSITIVE — AB
Spec Grav, UA: 1.02 (ref 1.010–1.025)
Urobilinogen, UA: 0.2 E.U./dL
pH, UA: 6 (ref 5.0–8.0)

## 2020-11-02 MED ORDER — DULOXETINE HCL 20 MG PO CPEP: ORAL_CAPSULE | ORAL | 0 refills | Status: DC

## 2020-11-02 MED ORDER — AMLODIPINE BESYLATE 10 MG PO TABS: 10.0000 mg | ORAL_TABLET | Freq: Every day | ORAL | 0 refills | Status: DC

## 2020-11-02 MED ORDER — SULFAMETHOXAZOLE-TRIMETHOPRIM 800-160 MG PO TABS: 1.0000 | ORAL_TABLET | Freq: Two times a day (BID) | ORAL | 0 refills | Status: DC

## 2020-11-02 NOTE — Progress Notes (Signed)
Subjective:    Patient ID: Denise [name], female    DOB: 29-Apr-1951, 69 y.o.   MRN: [id number]  HPI  This visit occurred during the SARS-CoV-2 public health emergency.  Safety protocols were in place, including screening questions prior to the visit, additional usage of staff PPE, and extensive cleaning of exam room while observing appropriate contact time as indicated for disinfecting solutions.   Denise [name] is a 69 year old female with a history of colitis, excessive sweating, prediabetes, chronically elevated Alk Phos who presents today with a chief complaint of dysuria. She would also like to discuss coming off of duloxetine.  1) Urinary Urgency: She also reports urinary urgency and hematuria. Symptoms began three days ago with urinary urgency. Yesterday she began to notice dysuria and hematuria. She has a Urology appointment scheduled for tomorrow for evaluation of renal cysts that were noted on recent MRI of her lumbar spine. She was told by [facility] to schedule an appointment with Urology.   She took two 500 mg tablets of metronidazole yesterday, at different times given her hematuria. Has not noticed any effects from this.   She denies nausea, vaginal symptoms, pelvic cramping, abdominal pain. She does have chronic urinary incontinence, worse recently with her newer URI symptoms.   2) Excessive Sweating: Chronic for years, negative work up overall. She has an appointment with dermatology who recommended she try coming off of Duloxetine as this could be contributing to her symptoms. She's been on Duloxetine for years and years for chronic migraines. Over the years her migraines have improved, she's not sure if this is due to retiring from work or due to the duloxetine.  She is concerned about weaning off of duloxetine given chronic anxiety and intermittent seasonal depression symptoms.   Review of Systems  Constitutional: Negative for fever.  Gastrointestinal: Negative for  abdominal pain.  Genitourinary: Positive for dysuria, hematuria and urgency.  Skin:       Excessive sweating  Psychiatric/Behavioral: The patient is nervous/anxious.        Past Medical History:  Diagnosis Date  . Allergy    seasonal  . Arthritis   . Bursitis of hip    Left  . Constipation    90%of the time c/o constipation, occ has diarrhea - uses stool softener   . Endometriosis   . Facial flushing 05/12/2014  . Fibroid   . GERD (gastroesophageal reflux disease)   . Hematochezia   . Hiatal hernia   . HSV-1 (herpes simplex virus 1) infection   . Hyperlipidemia   . Hyperplastic colon polyp   . Hypertension   . Hypothyroidism   . Ischemic colitis ([place]) 01/2019  . LBBB (left bundle branch block)   . Low back pain   . Migraine   . Osteopenia 2008   -1.2 FEMORAL NECK  . Prediabetes   . Stroke [facility])    TIA  . TIA (transient ischemic attack) 08-10-2013     Social History   Socioeconomic History  . Marital status: Divorced    Spouse name: Not on file  . Number of children: 2  . Years of education: Not on file  . Highest education level: Not on file  Occupational History    Employer: RETIRED  Tobacco Use  . Smoking status: Never Smoker  . Smokeless tobacco: Never Used  Vaping Use  . Vaping Use: Never used  Substance and Sexual Activity  . Alcohol use: Yes    Alcohol/week: 0.0 standard drinks  Comment: 1 every 3 months  . Drug use: No  . Sexual activity: Not Currently    Comment: intercourse age 67, more than 5 sexual partners,des neg  Other Topics Concern  . Not on file  Social History Narrative   Single.   2 daughters, 4 grandchildren.   Once worked for [employer].   Enjoys Ambulance person.    Social Determinants of Health   Financial Resource Strain:   . Difficulty of Paying Living Expenses: Not on file  Food Insecurity:   . Worried About Charity fundraiser in the Last Year: Not on file  . Ran Out of Food in the Last  Year: Not on file  Transportation Needs:   . Lack of Transportation (Medical): Not on file  . Lack of Transportation (Non-Medical): Not on file  Physical Activity:   . Days of Exercise per Week: Not on file  . Minutes of Exercise per Session: Not on file  Stress:   . Feeling of Stress : Not on file  Social Connections:   . Frequency of Communication with Friends and Family: Not on file  . Frequency of Social Gatherings with Friends and Family: Not on file  . Attends Religious Services: Not on file  . Active Member of Clubs or Organizations: Not on file  . Attends Archivist Meetings: Not on file  . Marital Status: Not on file  Intimate Partner Violence:   . Fear of Current or Ex-Partner: Not on file  . Emotionally Abused: Not on file  . Physically Abused: Not on file  . Sexually Abused: Not on file    Past Surgical History:  Procedure Laterality Date  . ABDOMINAL HYSTERECTOMY  1989   TAH, [place]  2003  . COLONOSCOPY    . KNEE SURGERY  2004   right  . NASAL SEPTUM SURGERY  2012  . PITUITARY SURGERY  2001  . POLYPECTOMY    . SHOULDER SURGERY  2007   right  . THYROID LOBECTOMY  2002    Family History  Problem Relation Age of Onset  . Heart disease Father   . Cancer Mother        brain tumor  . Breast cancer Maternal Aunt   . Parkinson's disease Sister   . Colon cancer Neg Hx   . Colon polyps Neg Hx   . Rectal cancer Neg Hx   . Stomach cancer Neg Hx   . Thyroid disease Neg Hx   . Esophageal cancer Neg Hx     Allergies  Allergen Reactions  . Tramadol Other (See Comments)    Reaction: NIGHTMARES    Current Outpatient Medications on File Prior to Visit  Medication Sig Dispense Refill  . amLODipine (NORVASC) 10 MG tablet Take 1 tablet (10 mg total) by mouth daily. For blood pressure. 90 tablet 0  . aspirin 81 MG tablet Take 81 mg by mouth daily.    Marland Kitchen atorvastatin (LIPITOR) 10 MG tablet TAKE 1 TABLET BY MOUTH  DAILY 90 tablet 3  .  cetirizine (ZYRTEC) 10 MG tablet Take 10 mg by mouth as needed for allergies. Reported on 05/28/2016    . dicyclomine (BENTYL) 10 MG capsule Take 1 capsule (10 mg total) by mouth every 8 (eight) hours as needed for spasms. 60 capsule 1  . esomeprazole (NEXIUM) 20 MG capsule Take 1 capsule (20 mg total) by mouth every morning. 30 capsule 11  . glycopyrrolate (ROBINUL) 1 MG tablet  Take 1 tablet by mouth once or twice daily for sweating. 60 tablet 0  . levothyroxine (SYNTHROID) 75 MCG tablet TAKE 1 TABLET BY MOUTH IN  THE MORNING ON AN EMPTY  STOMACH WITH WATER ONLY (NO FOOD OR OTHER MEDICATIONS   FOR 30 MINUTES) 90 tablet 3  . losartan (COZAAR) 100 MG tablet TAKE 1 TABLET BY MOUTH EVERY DAY FOR BLOOD PRESSURE 30 tablet 5  . metoprolol succinate (TOPROL-XL) 25 MG 24 hr tablet TAKE 1 TABLET BY MOUTH  DAILY 90 tablet 1  . naratriptan (AMERGE) 2.5 MG tablet TAKE 1 TABLET BY MOUTH AS NEEDED FOR MIGRAINE. MAY REPEAT IN 4 HOURS IF SYMPTOMS PERSIST. MAX 2 TABLETS IN 24 HOURS 20 tablet 0  . Polyethylene Glycol 3350 (MIRALAX PO) Take by mouth. 119 grams for colon 2 day prep     No current facility-administered medications on file prior to visit.    BP 110/72   Pulse 99   Temp (!) 97.5 F (36.4 C) (Temporal)   Ht 5' 4.5" (1.638 m)   Wt 213 lb (96.6 kg)   SpO2 97%   BMI 36.00 kg/m    Objective:   Physical Exam Constitutional:      Appearance: She is not ill-appearing.  Cardiovascular:     Rate and Rhythm: Normal rate and regular rhythm.  Pulmonary:     Effort: Pulmonary effort is normal.     Breath sounds: Normal breath sounds.  Abdominal:     Palpations: Abdomen is soft.     Tenderness: There is no abdominal tenderness. There is no right CVA tenderness or left CVA tenderness.  Musculoskeletal:     Cervical back: Neck supple.  Skin:    General: Skin is warm and dry.  Neurological:     Mental Status: She is alert.            Assessment & Plan:

## 2020-11-02 NOTE — Assessment & Plan Note (Signed)
Chronic for years, has been on duloxetine for migraine prevention for which we will be weaning off. Reduce to 40 mg daily x 2-3 weeks, then 20 mg daily x 2-3 weeks then stop.  I am concerned about symptoms of anxiety and depression occurring with gradual wean. If she experiences these symptoms, then we will consider venlafaxine or SSRI.   She will update.

## 2020-11-02 NOTE — Patient Instructions (Addendum)
Reduce your duloxetine (Cymbalta) to 40 mg, take two capsules (20mg ) once daily for 2-3 weeks, then take 1 capsule once daily for 2-3 weeks then stop.  Start Bactrim DS (sulfamethoxazole/trimethoprim) tablets for urinary tract infection. Take 1 tablet by mouth twice daily for 3 days.  It was a pleasure to see you today!

## 2020-11-02 NOTE — Assessment & Plan Note (Addendum)
Acute with other UTI symptoms x 3-4 days. UA today with 1+ leuks, trace blood. She typically does not have blood in UA's. Symptoms are bothersome.  Culture sent.  Given symptoms coupled with UA today, will treat with bactrim DS tablets x 3 days.

## 2020-11-02 NOTE — Assessment & Plan Note (Signed)
Has an appointment with dermatology. Will wean off duloxetine slowly as this could be contributing to sweats.  She will update.

## 2020-11-02 NOTE — Assessment & Plan Note (Signed)
Well controlled and infrequent over the years. Is also managed on duloxetine for this. Will be weaning off of duloxetine due to chronic excessive sweating.  Slow wean off of duloxetine, reduce to 40 mg x 2-3 weeks, then 20 mg x 2-3 weeks, then stop.  She will update.

## 2020-11-03 LAB — URINE CULTURE
MICRO NUMBER:: 11215600
SPECIMEN QUALITY:: ADEQUATE

## 2020-11-16 ENCOUNTER — Other Ambulatory Visit: Payer: Self-pay | Admitting: Primary Care

## 2020-11-16 DIAGNOSIS — I10 Essential (primary) hypertension: Secondary | ICD-10-CM

## 2020-11-25 DIAGNOSIS — M4727 Other spondylosis with radiculopathy, lumbosacral region: Secondary | ICD-10-CM | POA: Diagnosis not present

## 2020-11-25 DIAGNOSIS — M542 Cervicalgia: Secondary | ICD-10-CM | POA: Diagnosis not present

## 2020-11-25 DIAGNOSIS — M545 Low back pain, unspecified: Secondary | ICD-10-CM | POA: Diagnosis not present

## 2020-11-26 ENCOUNTER — Other Ambulatory Visit: Payer: Self-pay | Admitting: Primary Care

## 2020-11-26 DIAGNOSIS — R519 Headache, unspecified: Secondary | ICD-10-CM

## 2020-12-15 ENCOUNTER — Ambulatory Visit (INDEPENDENT_AMBULATORY_CARE_PROVIDER_SITE_OTHER): Payer: Medicare Other | Admitting: Internal Medicine

## 2020-12-15 ENCOUNTER — Encounter: Payer: Self-pay | Admitting: Internal Medicine

## 2020-12-15 ENCOUNTER — Other Ambulatory Visit: Payer: Self-pay

## 2020-12-15 VITALS — BP 128/80 | HR 87 | Temp 97.0°F | Wt 213.0 lb

## 2020-12-15 DIAGNOSIS — R3915 Urgency of urination: Secondary | ICD-10-CM | POA: Diagnosis not present

## 2020-12-15 DIAGNOSIS — R8290 Unspecified abnormal findings in urine: Secondary | ICD-10-CM

## 2020-12-15 DIAGNOSIS — R350 Frequency of micturition: Secondary | ICD-10-CM

## 2020-12-15 DIAGNOSIS — R3911 Hesitancy of micturition: Secondary | ICD-10-CM

## 2020-12-15 DIAGNOSIS — R32 Unspecified urinary incontinence: Secondary | ICD-10-CM

## 2020-12-15 MED ORDER — CEPHALEXIN 500 MG PO CAPS: 500.0000 mg | ORAL_CAPSULE | Freq: Two times a day (BID) | ORAL | 0 refills | Status: DC

## 2020-12-15 NOTE — Patient Instructions (Signed)

## 2020-12-15 NOTE — Progress Notes (Signed)
HPI  Pt presents to the clinic today with c/o frequency, urgency, hesitancy, cloudy urine, urine odor and urinary incontinence. She denies dysuria or blood in her urine. She denies vaginal discharge, odor or abnormal uterine bleeding. She was seen for similar issues 11/17, urine culture did not grow any bacteria. She reports Bactrim did improve her symptoms at that time. She has an appt with urology in January.    Review of Systems  Past Medical History:  Diagnosis Date  . Allergy    seasonal  . Arthritis   . Bursitis of hip    Left  . Constipation    90%of the time c/o constipation, occ has diarrhea - uses stool softener   . Endometriosis   . Facial flushing 05/12/2014  . Fibroid   . GERD (gastroesophageal reflux disease)   . Hematochezia   . Hiatal hernia   . HSV-1 (herpes simplex virus 1) infection   . Hyperlipidemia   . Hyperplastic colon polyp   . Hypertension   . Hypothyroidism   . Ischemic colitis ([facility]) 01/2019  . LBBB (left bundle branch block)   . Low back pain   . Migraine   . Osteopenia 2008   -1.2 FEMORAL NECK  . Prediabetes   . Stroke [facility])    TIA  . TIA (transient ischemic attack) 08-10-2013    Family History  Problem Relation Age of Onset  . Heart disease Father   . Cancer Mother        brain tumor  . Breast cancer Maternal Aunt   . Parkinson's disease Sister   . Colon cancer Neg Hx   . Colon polyps Neg Hx   . Rectal cancer Neg Hx   . Stomach cancer Neg Hx   . Thyroid disease Neg Hx   . Esophageal cancer Neg Hx     Social History   Socioeconomic History  . Marital status: Divorced    Spouse name: Not on file  . Number of children: 2  . Years of education: Not on file  . Highest education level: Not on file  Occupational History    Employer: RETIRED  Tobacco Use  . Smoking status: Never Smoker  . Smokeless tobacco: Never Used  Vaping Use  . Vaping Use: Never used  Substance and Sexual Activity  . Alcohol use: Yes    Alcohol/week: 0.0  standard drinks    Comment: 1 every 3 months  . Drug use: No  . Sexual activity: Not Currently    Comment: intercourse age 62, more than 5 sexual partners,des neg  Other Topics Concern  . Not on file  Social History Narrative   Single.   2 daughters, 4 grandchildren.   Once worked for [employer].   Enjoys Catering manager.    Social Determinants of Health   Financial Resource Strain: Not on file  Food Insecurity: Not on file  Transportation Needs: Not on file  Physical Activity: Not on file  Stress: Not on file  Social Connections: Not on file  Intimate Partner Violence: Not on file    Allergies  Allergen Reactions  . Tramadol Other (See Comments)    Reaction: NIGHTMARES     Constitutional: Denies fever, malaise, fatigue, headache or abrupt weight changes.   GU: Pt reports urgency, frequency, hesitancy, cloudy urine with odor. Denies burning sensation, blood in urine, or discharge. Skin: Denies redness, rashes, lesions or ulcercations.   No other specific complaints in a complete review of systems (except  as listed in HPI above).    Objective:   Physical Exam BP 128/80   Pulse 87   Temp (!) 97 F (36.1 C) (Temporal)   Wt 213 lb (96.6 kg)   SpO2 98%   BMI 36.00 kg/m   Wt Readings from Last 3 Encounters:  11/02/20 213 lb (96.6 kg)  05/30/20 215 lb 12 oz (97.9 kg)  04/18/20 217 lb 8 oz (98.7 kg)    General: Appears her stated age, obese, in NAD. Cardiovascular: Normal rate.  Pulmonary/Chest: Normal effort. Abdomen: Nontender. No CVA tenderness.        Assessment & Plan:   Urgency, Frequency, Hesitancy, Urine Odor, Cloudy Urine, Incontinence  Urinalysis: not enough urine for urinalysis Will send urine culture eRx sent if for Keflex 500 mg BID x 5 days Drink plenty of fluids Follow up with urology as planned  RTC as needed or if symptoms persist. Webb Silversmith, NP This visit occurred during the SARS-CoV-2 public health  emergency.  Safety protocols were in place, including screening questions prior to the visit, additional usage of staff PPE, and extensive cleaning of exam room while observing appropriate contact time as indicated for disinfecting solutions.

## 2020-12-16 LAB — URINE CULTURE
MICRO NUMBER:: 11370508
SPECIMEN QUALITY:: ADEQUATE

## 2020-12-22 ENCOUNTER — Other Ambulatory Visit: Payer: Self-pay | Admitting: Primary Care

## 2020-12-26 DIAGNOSIS — M542 Cervicalgia: Secondary | ICD-10-CM | POA: Diagnosis not present

## 2020-12-26 DIAGNOSIS — M5030 Other cervical disc degeneration, unspecified cervical region: Secondary | ICD-10-CM | POA: Diagnosis not present

## 2020-12-26 DIAGNOSIS — G959 Disease of spinal cord, unspecified: Secondary | ICD-10-CM | POA: Diagnosis not present

## 2020-12-29 DIAGNOSIS — N281 Cyst of kidney, acquired: Secondary | ICD-10-CM | POA: Diagnosis not present

## 2021-01-17 ENCOUNTER — Encounter: Payer: Self-pay | Admitting: Primary Care

## 2021-01-17 ENCOUNTER — Ambulatory Visit (INDEPENDENT_AMBULATORY_CARE_PROVIDER_SITE_OTHER): Payer: Medicare Other

## 2021-01-17 ENCOUNTER — Other Ambulatory Visit: Payer: Self-pay

## 2021-01-17 DIAGNOSIS — M5416 Radiculopathy, lumbar region: Secondary | ICD-10-CM | POA: Diagnosis not present

## 2021-01-17 DIAGNOSIS — Z23 Encounter for immunization: Secondary | ICD-10-CM

## 2021-01-18 DIAGNOSIS — N281 Cyst of kidney, acquired: Secondary | ICD-10-CM | POA: Diagnosis not present

## 2021-01-18 DIAGNOSIS — Z9049 Acquired absence of other specified parts of digestive tract: Secondary | ICD-10-CM | POA: Diagnosis not present

## 2021-01-18 DIAGNOSIS — I700 Atherosclerosis of aorta: Secondary | ICD-10-CM | POA: Diagnosis not present

## 2021-01-19 DIAGNOSIS — G959 Disease of spinal cord, unspecified: Secondary | ICD-10-CM | POA: Diagnosis not present

## 2021-01-24 DIAGNOSIS — N281 Cyst of kidney, acquired: Secondary | ICD-10-CM | POA: Diagnosis not present

## 2021-02-07 ENCOUNTER — Other Ambulatory Visit: Payer: Self-pay | Admitting: Primary Care

## 2021-02-07 DIAGNOSIS — I10 Essential (primary) hypertension: Secondary | ICD-10-CM

## 2021-02-08 NOTE — Telephone Encounter (Signed)
Patient needs CPE in June 2022 for further refills. Please schedule.

## 2021-02-13 NOTE — Telephone Encounter (Signed)
Called patient to schedule cpe. LVM to call back.  

## 2021-02-16 NOTE — Telephone Encounter (Signed)
Called patient to schedule cpe. LVM to call back. Letter sent.  

## 2021-03-14 ENCOUNTER — Other Ambulatory Visit: Payer: Self-pay | Admitting: Primary Care

## 2021-03-14 DIAGNOSIS — E785 Hyperlipidemia, unspecified: Secondary | ICD-10-CM

## 2021-03-14 DIAGNOSIS — E039 Hypothyroidism, unspecified: Secondary | ICD-10-CM

## 2021-03-15 NOTE — Telephone Encounter (Signed)
Office visit required for further refills.  Due in June for CPE.

## 2021-03-16 NOTE — Telephone Encounter (Signed)
Called patient to schedule cpe. Unable to lvm to call back to schedule appointment. Will attempt again later.

## 2021-03-21 ENCOUNTER — Encounter: Payer: Self-pay | Admitting: Primary Care

## 2021-03-21 NOTE — Telephone Encounter (Signed)
Called patient and lvm to call back to schedule cpe. Letter sent.

## 2021-03-23 DIAGNOSIS — E039 Hypothyroidism, unspecified: Secondary | ICD-10-CM

## 2021-03-23 DIAGNOSIS — R61 Generalized hyperhidrosis: Secondary | ICD-10-CM

## 2021-03-27 DIAGNOSIS — M79641 Pain in right hand: Secondary | ICD-10-CM | POA: Diagnosis not present

## 2021-03-27 DIAGNOSIS — M79642 Pain in left hand: Secondary | ICD-10-CM | POA: Diagnosis not present

## 2021-03-27 DIAGNOSIS — M19049 Primary osteoarthritis, unspecified hand: Secondary | ICD-10-CM | POA: Diagnosis not present

## 2021-03-27 DIAGNOSIS — M19041 Primary osteoarthritis, right hand: Secondary | ICD-10-CM | POA: Diagnosis not present

## 2021-03-27 DIAGNOSIS — M19042 Primary osteoarthritis, left hand: Secondary | ICD-10-CM | POA: Diagnosis not present

## 2021-03-27 DIAGNOSIS — M65352 Trigger finger, left little finger: Secondary | ICD-10-CM | POA: Diagnosis not present

## 2021-03-30 DIAGNOSIS — R059 Cough, unspecified: Secondary | ICD-10-CM | POA: Diagnosis not present

## 2021-03-30 DIAGNOSIS — U071 COVID-19: Secondary | ICD-10-CM

## 2021-03-30 NOTE — Telephone Encounter (Signed)
Pt said she did a home test for covid today and it was positive. Pt said she has a fever but no thermometer; chills,body aches, severe H/A with pain level of 9 - 10. Pt has prod cough with white blood tinged phlegm; pt hears rattles on and off in chest; sinus pressure in face, S/T that it does hurt to swallow and throat feels swollen but not difficulty in breathing or SOB. Pt said lymph glands on both sides of neck are swollen. Pt has runny nose. No diarrhea or vomiting and not loss of taste or smell.pt is going to go to UC in [place] this morning for eval and possible testing of throat or CXR if needed. Pt does request answer from Anda Kraft about her getting an antiviral. Pt request cb.

## 2021-04-03 NOTE — Telephone Encounter (Signed)
[place] Night - Client TELEPHONE ADVICE RECORD [facility] Patient Name: Denise [name] Gender: Female DOB: 1951-02-23 Age: 70 Y 58 M 17 D Return Phone Number: [phone] (Primary) Address: City/ State/ Zip: [place] [address] Client [place] Night - Client Client Site [place] Physician Alma Friendly - NP Contact Type Call Who Is Calling Patient / Member / Family / Caregiver Call Type Triage / Clinical Relationship To Patient Self Return Phone Number [phone] (Primary) Chief Complaint Headache Reason for Call Symptomatic / Request for [place] stated she has covid and was waiting to hear back about an infusion. She is in the at risk category, but she never heard back from her. They sent a message asking when it started. She has severe headache, fever, deep cough, body aches. Cough is deeper today than it has been. No shortness of breath. Symptoms started Wednesday morning and took positive home test Thursday. Translation No Nurse Assessment Nurse: Lenon Curt, RN, Melanie Date/Time (Eastern Time): 03/31/2021 4:48:17 PM Confirm and document reason for call. If symptomatic, describe symptoms. ---5pm yesterday got a message on portal - Alma Friendly. Didn't realize got a message, responded today but the office is closed for the holiday. Does the patient have any new or worsening symptoms? ---Yes Will a triage be completed? ---Yes Related visit to physician within the last 2 weeks? ---Yes Does the PT have any chronic conditions? (i.e. diabetes, asthma, this includes High risk factors for pregnancy, etc.) ---Yes Is this a behavioral health or substance abuse call? ---No Guidelines Guideline Title Affirmed Question Affirmed Notes Nurse Date/Time (Eastern Time) COVID-19 - Diagnosed or Suspected HIGH RISK for severe COVID complications  (e.g., weak immune system, age > 85 years, obesity with BMI > 25, pregnant, Lenon Curt, RN, Threasa Beards 03/31/2021 4:51:10 PM PLEASE NOTE: All timestamps contained within this report are represented as [place] Standard Time. CONFIDENTIALTY NOTICE: This fax transmission is intended only for the addressee. It contains information that is legally privileged, confidential or otherwise protected from use or disclosure. If you are not the intended recipient, you are strictly prohibited from reviewing, disclosing, copying using or disseminating any of this information or taking any action in reliance on or regarding this information. If you have received this fax in error, please notify us immediately by telephone so that we can arrange for its return to [place]. Phone: [phone], Toll-Free: [phone], Fax: [phone] Page: 2 of 3 Call Id: [id number] Guidelines Guideline Title Affirmed Question Affirmed Notes Nurse Date/Time Eilene Ghazi Time) chronic lung disease or other chronic medical condition) (Exception: Already seen by PCP and no new or worsening symptoms.) Disp. Time Eilene Ghazi Time) Disposition Final User 03/31/2021 4:51:28 PM Called On-Call Provider [place], RN, Melanie 03/31/2021 4:57:29 PM Call PCP Now Yes Lenon Curt, RN, Donnajean Lopes Disagree/Comply Comply Caller Understands Yes PreDisposition Call Doctor Care Advice Given Per Guideline CALL PCP NOW: * You need to discuss this with your doctor (or NP/PA). * I'll page the on-call provider now. If you haven't heard from the provider (or me) within 30 minutes, call again. CALL BACK IF: * You become worse CARE ADVICE given per COVID-19 - DIAGNOSED OR SUSPECTED (Adult) guideline. Comments User: Erik Obey, RN Date/Time Eilene Ghazi Time): 03/31/2021 4:58:34 PM Saw something online about [facility] and a treatment. Directed her to call them directly for more details. Knows to be seen if severe s/s occur. Paging DoctorName Phone  DateTime Result/ Outcome Message Type Notes Grier Mitts-  MD [phone] 03/31/2021 4:51:28 PM Called On Call Provider - Reached Doctor Paged Grier Mitts- MD 03/31/2021 4:56:43 PM Spoke with On Call - General Message Result Report given. It may have been ordered, the infusion center reviews the orders and calls the patients for scheduling. It is a holiday weekend so their schedule may be different. Call back to the office on Monday for further assistance. PLEASE NOTE: All timestamps contained within this report are represented as [place] Standard Time. CONFIDENTIALTY NOTICE: This fax transmission is intended only for the addressee. It contains information that is legally privileged, confidential or otherwise protected from use or disclosure. If you are not the intended recipient, you are strictly prohibited from reviewing, disclosing, copying using or disseminating any of this information or taking any action in reliance on or regarding this information. If you have received this fax in error, please notify us immediately by telephone so that we can arrange for its return to [place]. Phone: [phone], Toll-Free: [phone], Fax: [phone] Page: 3 of 3 Call Id:

## 2021-04-04 ENCOUNTER — Telehealth: Payer: Self-pay

## 2021-04-04 NOTE — Telephone Encounter (Signed)
Called to discuss with patient about COVID-19 symptoms and the use of one of the available treatments for those with mild to moderate Covid symptoms and at a high risk of hospitalization.  Pt appears to qualify for outpatient treatment due to co-morbid conditions and/or a member of an at-risk group in accordance with the FDA Emergency Use Authorization.    Symptom onset: 03/29/21 Fever,cough,headache,body aches Vaccinated: Yes Booster? Unknown Immunocompromised? No Qualifiers: HTN  Unable to reach pt - Unable to leave message, voice mailbox is full.   Marcello Moores

## 2021-04-13 ENCOUNTER — Other Ambulatory Visit: Payer: Self-pay

## 2021-04-13 ENCOUNTER — Encounter: Payer: Self-pay | Admitting: Family Medicine

## 2021-04-13 ENCOUNTER — Telehealth (INDEPENDENT_AMBULATORY_CARE_PROVIDER_SITE_OTHER): Payer: Medicare Other | Admitting: Family Medicine

## 2021-04-13 VITALS — BP 133/83 | HR 83

## 2021-04-13 DIAGNOSIS — U099 Post covid-19 condition, unspecified: Secondary | ICD-10-CM | POA: Diagnosis not present

## 2021-04-13 DIAGNOSIS — J0100 Acute maxillary sinusitis, unspecified: Secondary | ICD-10-CM | POA: Diagnosis not present

## 2021-04-13 MED ORDER — AMOXICILLIN-POT CLAVULANATE 875-125 MG PO TABS: 1.0000 | ORAL_TABLET | Freq: Two times a day (BID) | ORAL | 0 refills | Status: AC

## 2021-04-13 NOTE — Progress Notes (Signed)
I connected with Denise [name] on 04/13/21 at 12:20 PM EDT by video and verified that I am speaking with the correct person using two identifiers.   I discussed the limitations, risks, security and privacy concerns of performing an evaluation and management service by video and the availability of in person appointments. I also discussed with the patient that there may be a patient responsible charge related to this service. The patient expressed understanding and agreed to proceed.  Patient location: Home Provider Location: [place] Participants: Lesleigh Noe and Denise [name]   Subjective:     Denise [name] is a 70 y.o. female presenting for Cough, Covid Positive (Tested on 4/14), Sore Throat, and Sinusitis (Green, thick mucous )     HPI   #Covid infection - diagnosed on 4/14 - was having sinus symptoms prior to covid - had a deviated septum surgery in the past - was considering returning to ENT - Frontal HA before and during covid - tenderness on the maxillary - neck swelling  - sore and red throat - swallowing ok but with some pain - left ear pain - cotton ball symptom in the throat - white mucus with covid and thin - now has green, thick mucus which is hard to pass  Treatment: tylenol sinus - but limits due to HTN - this dried her out - tries to limit 2/2 to prediabetes - saline nasal spray - delsum   Notes significant fatigue since covid infeciton  Review of Systems  Constitutional: Negative for chills and fever.  Respiratory: Positive for cough. Negative for shortness of breath.   Cardiovascular: Negative for chest pain.     Social History   Tobacco Use  Smoking Status Never Smoker  Smokeless Tobacco Never Used        Objective:   BP Readings from Last 3 Encounters:  04/13/21 133/83  12/15/20 128/80  11/02/20 110/72   Wt Readings from Last 3 Encounters:  12/15/20 213 lb (96.6 kg)  11/02/20 213 lb (96.6 kg)   05/30/20 215 lb 12 oz (97.9 kg)    BP 133/83   Pulse 83    Physical Exam Constitutional:      Appearance: Normal appearance. She is not ill-appearing.  HENT:     Head: Normocephalic and atraumatic.     Right Ear: External ear normal.     Left Ear: External ear normal.  Eyes:     Conjunctiva/sclera: Conjunctivae normal.  Pulmonary:     Effort: Pulmonary effort is normal. No respiratory distress.  Neurological:     Mental Status: She is alert. Mental status is at baseline.  Psychiatric:        Mood and Affect: Mood normal.        Behavior: Behavior normal.        Thought Content: Thought content normal.        Judgment: Judgment normal.            Assessment & Plan:   Problem List Items Addressed This Visit   None   Visit Diagnoses    Acute non-recurrent maxillary sinusitis    -  Primary   Relevant Medications   promethazine-dextromethorphan (PROMETHAZINE-DM) 6.25-15 MG/5ML syrup   amoxicillin-clavulanate (AUGMENTIN) 875-125 MG tablet   Post-acute COVID-19 syndrome         Symptoms for several months.  Discussed trial of abx to try and clear Advised saline rinse Keep planned ENT appointment if not improved  Pt only  2 weeks out from covid infection. Discussed fatigue can be normal at this time. If improving continue to monitor. If worsening would recommend f/u appt to discuss alternative diagnosis.   Return if symptoms worsen or fail to improve.  Lesleigh Noe, MD

## 2021-04-20 ENCOUNTER — Telehealth: Payer: Self-pay

## 2021-04-20 DIAGNOSIS — I10 Essential (primary) hypertension: Secondary | ICD-10-CM

## 2021-04-20 NOTE — Telephone Encounter (Signed)
Received refill request for Amodipine. Per last note needed blood pressure follow up in office. I have called patient l/m to call office. We need to schedule follow up and will need message sent back to me so I can call in refill to mail order.

## 2021-04-27 NOTE — Telephone Encounter (Signed)
[facility] Day - Client TELEPHONE ADVICE RECORD [facility] Patient Name: Denise [name] Gender: Female DOB: 1951/04/04 Age: 70 Y 29 M 13 D Return Phone Number: [phone] (Primary) Address: City/ State/ Zip: [place] [address] Client [facility] [facility] - Client Client Site [place] - Day Physician Alma Friendly - NP Contact Type Call Who Is Calling Patient / Member / Family / Caregiver Call Type Triage / Clinical Relationship To Patient Self Return Phone Number [phone] (Primary) Chief Complaint Double Vision Reason for Call Symptomatic / Request for [name] states that she has been having double vision for the last couple of days and she is concerned that she could be pre diabetic. Caller was transferred from the office and they do have her an appointment for next Friday but wanted her to be triaged. [facility] Not Listed ED Translation No Nurse Assessment Nurse: Rodney Cruise, RN, Sean Date/Time (Eastern Time): 04/27/2021 11:58:28 AM Confirm and document reason for call. If symptomatic, describe symptoms. ---Caller states having double vision, COVID 6 weeks ago, mouth is dry all the time, headache, history of migraines but this one feels different. Does the patient have any new or worsening symptoms? ---Yes Will a triage be completed? ---Yes Related visit to physician within the last 2 weeks? ---No Does the PT have any chronic conditions? (i.e. diabetes, asthma, this includes High risk factors for pregnancy, etc.) ---Yes List chronic conditions. ---Pre Diabetes, Migraines, HTN, Hyperlipidemia Is this a behavioral health or substance abuse call? ---No Guidelines Guideline Title Affirmed Question Affirmed Notes Nurse Date/Time Eilene Ghazi Time) Vision Loss or Change Double vision Baxter, RN, Hilliard Clark 04/27/2021 12:01:29 PM Disp. Time  Eilene Ghazi Time) Disposition Final User PLEASE NOTE: All timestamps contained within this report are represented as [place] Standard Time. CONFIDENTIALTY NOTICE: This fax transmission is intended only for the addressee. It contains information that is legally privileged, confidential or otherwise protected from use or disclosure. If you are not the intended recipient, you are strictly prohibited from reviewing, disclosing, copying using or disseminating any of this information or taking any action in reliance on or regarding this information. If you have received this fax in error, please notify us immediately by telephone so that we can arrange for its return to [place]. Phone: [phone], Toll-Free: [phone], Fax: [phone] Page: 2 of 2 Call Id: [id number] 04/27/2021 12:03:20 PM Go to ED Now (or PCP triage) Yes Baxter, RN, Lillia Dallas Disagree/Comply Comply Caller Understands Yes PreDisposition InappropriateToAsk Care Advice Given Per Guideline GO TO ED NOW (OR PCP TRIAGE): ANOTHER ADULT SHOULD DRIVE: * It is better and safer if another adult drives instead of you. CARE ADVICE given per Vision Loss or Change (Adult) guideline. Referrals GO TO FACILITY OTHER - SPECIFY

## 2021-04-27 NOTE — Telephone Encounter (Signed)
Noted and appreciate the follow up. Will evaluate.

## 2021-04-27 NOTE — Telephone Encounter (Signed)
I spoke with pt; pt said for 1 year periodically she has had double vision; No dizziness, CP or SOB,mouth is dry all the time, pt has H/A now with pain level of 2 -3. The double vision comes and goes;Pt said she had covid about 6 wks ago and pt has been tired ever since had covid. Pt is supposed to drive 5 - 6 hours tomorrow to go on trip and pt will be by herself if she is able to go. Pt does not have way to ck BS; pt does not want to go to ED due to expense but pt will go to [facility] in [place] for eval and see if any needed testing due to her wanting to travel tomorrow; pt wants to keep appt with Gentry Fitz NP on  05/05/21.sending note to Gentry Fitz NP.

## 2021-05-01 NOTE — Telephone Encounter (Signed)
Pt scheduled for appt on 5/20 

## 2021-05-02 ENCOUNTER — Other Ambulatory Visit: Payer: Self-pay | Admitting: Primary Care

## 2021-05-02 ENCOUNTER — Telehealth: Payer: Self-pay | Admitting: Primary Care

## 2021-05-02 DIAGNOSIS — I10 Essential (primary) hypertension: Secondary | ICD-10-CM

## 2021-05-02 MED ORDER — AMLODIPINE BESYLATE 10 MG PO TABS: 10.0000 mg | ORAL_TABLET | Freq: Every day | ORAL | 0 refills | Status: DC

## 2021-05-02 NOTE — Telephone Encounter (Signed)
Denise [name] called in and stated that she tested positive for covid this morning and she only has fatigue and headache. And she stated that she has paperwork that needs to be filled out. And wanted to know about what to do. If she doenst answer you send a message on mychart

## 2021-05-02 NOTE — Addendum Note (Signed)
Addended by: Pleas Koch on: 05/02/2021 07:28 AM   Modules accepted: Orders

## 2021-05-02 NOTE — Telephone Encounter (Signed)
Noted, refill provided. Will see her on 05/20 as scheduled.

## 2021-05-02 NOTE — Telephone Encounter (Signed)
Called patient she had no idea what I was talking about. Did this get sent under wrong patient?

## 2021-05-02 NOTE — Telephone Encounter (Signed)
Yes, I am fine to see her virtually. We can always work out labs and vitals.

## 2021-05-03 NOTE — Telephone Encounter (Signed)
Patient must be seen in June for CPE/follow-up for further refills Please schedule for June

## 2021-05-03 NOTE — Telephone Encounter (Signed)
Noted  

## 2021-05-03 NOTE — Telephone Encounter (Signed)
Called patient states she is not sure what I am talking about she had covid 5 weeks ago and request to keep office visit in person. Apologized for confusion and  let her know we are happy to see in office.

## 2021-05-05 ENCOUNTER — Ambulatory Visit: Payer: Medicare Other | Admitting: Primary Care

## 2021-05-05 NOTE — Telephone Encounter (Signed)
Mailbox full

## 2021-05-08 ENCOUNTER — Other Ambulatory Visit: Payer: Self-pay

## 2021-05-08 DIAGNOSIS — I10 Essential (primary) hypertension: Secondary | ICD-10-CM

## 2021-05-08 DIAGNOSIS — J310 Chronic rhinitis: Secondary | ICD-10-CM | POA: Diagnosis not present

## 2021-05-08 DIAGNOSIS — J343 Hypertrophy of nasal turbinates: Secondary | ICD-10-CM | POA: Diagnosis not present

## 2021-05-08 DIAGNOSIS — J324 Chronic pansinusitis: Secondary | ICD-10-CM | POA: Diagnosis not present

## 2021-05-11 DIAGNOSIS — H25013 Cortical age-related cataract, bilateral: Secondary | ICD-10-CM | POA: Diagnosis not present

## 2021-05-11 DIAGNOSIS — H524 Presbyopia: Secondary | ICD-10-CM | POA: Diagnosis not present

## 2021-05-11 DIAGNOSIS — H2513 Age-related nuclear cataract, bilateral: Secondary | ICD-10-CM | POA: Diagnosis not present

## 2021-05-11 DIAGNOSIS — H52212 Irregular astigmatism, left eye: Secondary | ICD-10-CM | POA: Diagnosis not present

## 2021-05-17 NOTE — Telephone Encounter (Signed)
Called patient per last refill patient needs appointment. Left message to return call to our office to make that.

## 2021-05-17 NOTE — Telephone Encounter (Signed)
See other open message.

## 2021-05-19 NOTE — Telephone Encounter (Signed)
Mail box is full not able to leave v/m

## 2021-05-22 MED ORDER — AMLODIPINE BESYLATE 10 MG PO TABS: 10.0000 mg | ORAL_TABLET | Freq: Every day | ORAL | 0 refills | Status: DC

## 2021-05-22 NOTE — Telephone Encounter (Signed)
Please send my chart message, then letter if no response.  She just needs to be seen within the next 3 months.

## 2021-05-22 NOTE — Telephone Encounter (Signed)
Have called patient x 3 voice mail is full

## 2021-05-24 NOTE — Telephone Encounter (Signed)
Patient has appointment made for next month

## 2021-05-26 ENCOUNTER — Ambulatory Visit: Payer: Medicare Other | Admitting: Primary Care

## 2021-06-01 ENCOUNTER — Encounter: Payer: Self-pay | Admitting: Primary Care

## 2021-06-01 ENCOUNTER — Other Ambulatory Visit: Payer: Self-pay

## 2021-06-01 ENCOUNTER — Ambulatory Visit (INDEPENDENT_AMBULATORY_CARE_PROVIDER_SITE_OTHER): Payer: Medicare Other | Admitting: Primary Care

## 2021-06-01 VITALS — BP 116/72 | HR 77 | Temp 98.3°F | Wt 212.2 lb

## 2021-06-01 DIAGNOSIS — F32A Depression, unspecified: Secondary | ICD-10-CM | POA: Diagnosis not present

## 2021-06-01 DIAGNOSIS — G479 Sleep disorder, unspecified: Secondary | ICD-10-CM | POA: Diagnosis not present

## 2021-06-01 DIAGNOSIS — F419 Anxiety disorder, unspecified: Secondary | ICD-10-CM | POA: Diagnosis not present

## 2021-06-01 MED ORDER — HYDROXYZINE HCL 10 MG PO TABS: 10.0000 mg | ORAL_TABLET | Freq: Every evening | ORAL | 0 refills | Status: DC | PRN

## 2021-06-01 MED ORDER — DULOXETINE HCL 30 MG PO CPEP: 30.0000 mg | ORAL_CAPSULE | Freq: Every day | ORAL | 0 refills | Status: DC

## 2021-06-01 NOTE — Assessment & Plan Note (Signed)
Deteriorated.   On Cymbalta 60 mg for headaches prevention which can also help with anxiety and depression.   Increase Cymbalta to 90 mg by adding extra 30 mg dose. Rx for hydroxyzine 10 mg provided to use HS for anxiety.  She will update.

## 2021-06-01 NOTE — Progress Notes (Signed)
Subjective:    Patient ID: Denise [name], female    DOB: [birth year], 70 y.o.   MRN: [id number]  HPI  Denise [name] is a very pleasant 70 y.o. female with a history of hypertension, migraines, hypothyroidism, anxiety and depression who presents today to discuss anxiety and sleep.  Chronic history of anxiety and over the last few months symptoms have progressed. She's been under a lot of stress as she is helping to care for her sister who resides in SNF with advanced Parkinson and mild dementia. Her daughter is having marital problems.   Symptoms include palpitations, tearfulness, feeling anxious, difficulty sleeping. Currently managed on Cymbalta 60 mg for which she's been taking for years. Difficulty falling (mind racing thoughts) and staying asleep, will sleep for about two hours, will wake to urinate, struggles with incontinence. Up and down all night. Daytime tiredness. Isn't sure if she snores, has never undergone a sleep study  Has been evaluated by Urology for urinary incontinence in the past, recommended PT but she never pursued. She's tried taking a few OTC treatments for sleep without improvement.   BP Readings from Last 3 Encounters:  06/01/21 116/72  04/13/21 133/83  12/15/20 128/80   Wt Readings from Last 3 Encounters:  06/01/21 212 lb 4 oz (96.3 kg)  12/15/20 213 lb (96.6 kg)  11/02/20 213 lb (96.6 kg)      Review of Systems  Cardiovascular:  Positive for palpitations.  Psychiatric/Behavioral:  Positive for sleep disturbance. The patient is nervous/anxious.         Past Medical History:  Diagnosis Date   Allergy    seasonal   Arthritis    Bursitis of hip    Left   Constipation    90%of the time c/o constipation, occ has diarrhea - uses stool softener    Endometriosis    Facial flushing 05/12/2014   Fibroid    GERD (gastroesophageal reflux disease)    Hematochezia    Hiatal hernia    HSV-1 (herpes simplex virus 1) infection    Hyperlipidemia     Hyperplastic colon polyp    Hypertension    Hypothyroidism    Ischemic colitis ([place]) 01/2019   LBBB (left bundle branch block)    Low back pain    Migraine    Osteopenia 2008   -1.2 FEMORAL NECK   Prediabetes    Stroke [facility])    TIA   TIA (transient ischemic attack) 08-10-2013    Social History   Socioeconomic History   Marital status: Divorced    Spouse name: Not on file   Number of children: 2   Years of education: Not on file   Highest education level: Not on file  Occupational History    Employer: RETIRED  Tobacco Use   Smoking status: Never   Smokeless tobacco: Never  Vaping Use   Vaping Use: Never used  Substance and Sexual Activity   Alcohol use: Yes    Alcohol/week: 0.0 standard drinks    Comment: 1 every 3 months   Drug use: No   Sexual activity: Not Currently    Comment: intercourse age 1, more than 5 sexual partners,des neg  Other Topics Concern   Not on file  Social History Narrative   Single.   2 daughters, 4 grandchildren.   Once worked for [employer].   Enjoys Ambulance person.    Social Determinants of Health   Financial Resource Strain: Not on file  Food Insecurity:  Not on file  Transportation Needs: Not on file  Physical Activity: Not on file  Stress: Not on file  Social Connections: Not on file  Intimate Partner Violence: Not on file    Past Surgical History:  Procedure Laterality Date   ABDOMINAL HYSTERECTOMY  1989   TAH, [place]  2003   COLONOSCOPY     KNEE SURGERY  2004   right   NASAL SEPTUM SURGERY  2012   PITUITARY SURGERY  2001   POLYPECTOMY     SHOULDER SURGERY  2007   right   THYROID LOBECTOMY  2002    Family History  Problem Relation Age of Onset   Heart disease Father    Cancer Mother        brain tumor   Breast cancer Maternal Aunt    Parkinson's disease Sister    Colon cancer Neg Hx    Colon polyps Neg Hx    Rectal cancer Neg Hx    Stomach cancer Neg Hx     Thyroid disease Neg Hx    Esophageal cancer Neg Hx     Allergies  Allergen Reactions   Tramadol Other (See Comments)    Reaction: NIGHTMARES    Current Outpatient Medications on File Prior to Visit  Medication Sig Dispense Refill   amLODipine (NORVASC) 10 MG tablet Take 1 tablet (10 mg total) by mouth daily. For blood pressure. 90 tablet 0   aspirin 81 MG tablet Take 81 mg by mouth daily.     atorvastatin (LIPITOR) 10 MG tablet Take 1 tablet (10 mg total) by mouth daily. For cholesterol. 90 tablet 0   DULoxetine (CYMBALTA) 60 MG capsule TAKE 1 CAPSULE BY MOUTH  DAILY FOR HEADACHE  PREVENTION 90 capsule 3   esomeprazole (NEXIUM) 20 MG capsule Take 1 capsule (20 mg total) by mouth every morning. 30 capsule 11   levothyroxine (SYNTHROID) 75 MCG tablet TAKE 1 TABLET BY MOUTH IN  THE MORNING ON AN EMPTY  STOMACH WITH WATER ONLY. NO FOOD OR OTHER MEDICATIONS   FOR 30 MINUTES 90 tablet 0   metoprolol succinate (TOPROL-XL) 25 MG 24 hr tablet Take 1 tablet (25 mg total) by mouth daily. For blood pressure. 30 tablet 0   naratriptan (AMERGE) 2.5 MG tablet TAKE 1 TABLET BY MOUTH AS NEEDED FOR MIGRAINE. MAY REPEAT IN 4 HOURS IF SYMPTOMS PERSIST. MAX 2 TABLETS IN 24 HOURS 20 tablet 0   No current facility-administered medications on file prior to visit.    BP 116/72 (BP Location: Left Arm, Patient Position: Sitting, Cuff Size: Large)   Pulse 77   Temp 98.3 F (36.8 C) (Temporal)   Wt 212 lb 4 oz (96.3 kg)   SpO2 97%   BMI 35.87 kg/m  Objective:   Physical Exam Cardiovascular:     Rate and Rhythm: Normal rate and regular rhythm.  Pulmonary:     Effort: Pulmonary effort is normal.     Breath sounds: Normal breath sounds.  Musculoskeletal:     Cervical back: Neck supple.  Skin:    General: Skin is warm and dry.  Psychiatric:        Mood and Affect: Mood normal.     Comments: Tearful during visit          Assessment & Plan:      This visit occurred during the SARS-CoV-2 public  health emergency.  Safety protocols were in place, including screening questions prior to the visit, additional usage of staff  PPE, and extensive cleaning of exam room while observing appropriate contact time as indicated for disinfecting solutions.

## 2021-06-01 NOTE — Patient Instructions (Signed)
We increased the dose of your duloxetine (Cymbalta) to 90 mg. I added an extra 30 mg capsule to your 60 mg dose.  You can take hydroxyzine 10 mg at bedtime for anxiety/sleep. Can take 1 or 2 tablets.   Schedule a follow up visit for 1 month.   It was a pleasure to see you today!

## 2021-06-01 NOTE — Assessment & Plan Note (Signed)
Partially secondary to anxiety, but do question sleep apnea.   Start with hydroxyzine 10 mg PRN for sleep/anxiety. Will also work to treat anxiety.   Epworth Sleepiness Score of 13 today.  Consider sleep study, she will update.

## 2021-06-07 ENCOUNTER — Ambulatory Visit: Payer: Medicare Other | Admitting: Endocrinology

## 2021-06-07 ENCOUNTER — Other Ambulatory Visit: Payer: Self-pay | Admitting: Primary Care

## 2021-06-07 DIAGNOSIS — E039 Hypothyroidism, unspecified: Secondary | ICD-10-CM

## 2021-06-07 DIAGNOSIS — E785 Hyperlipidemia, unspecified: Secondary | ICD-10-CM

## 2021-06-15 ENCOUNTER — Other Ambulatory Visit: Payer: Self-pay | Admitting: Primary Care

## 2021-06-15 DIAGNOSIS — I10 Essential (primary) hypertension: Secondary | ICD-10-CM

## 2021-06-23 ENCOUNTER — Ambulatory Visit (INDEPENDENT_AMBULATORY_CARE_PROVIDER_SITE_OTHER): Payer: Medicare Other | Admitting: Primary Care

## 2021-06-23 ENCOUNTER — Other Ambulatory Visit: Payer: Self-pay

## 2021-06-23 VITALS — BP 120/82 | HR 82 | Temp 98.6°F | Ht 64.5 in | Wt 209.0 lb

## 2021-06-23 DIAGNOSIS — G43009 Migraine without aura, not intractable, without status migrainosus: Secondary | ICD-10-CM

## 2021-06-23 DIAGNOSIS — E890 Postprocedural hypothyroidism: Secondary | ICD-10-CM | POA: Diagnosis not present

## 2021-06-23 DIAGNOSIS — R7303 Prediabetes: Secondary | ICD-10-CM

## 2021-06-23 DIAGNOSIS — D352 Benign neoplasm of pituitary gland: Secondary | ICD-10-CM | POA: Diagnosis not present

## 2021-06-23 DIAGNOSIS — Z1231 Encounter for screening mammogram for malignant neoplasm of breast: Secondary | ICD-10-CM

## 2021-06-23 DIAGNOSIS — G479 Sleep disorder, unspecified: Secondary | ICD-10-CM | POA: Diagnosis not present

## 2021-06-23 DIAGNOSIS — K219 Gastro-esophageal reflux disease without esophagitis: Secondary | ICD-10-CM | POA: Diagnosis not present

## 2021-06-23 DIAGNOSIS — Z23 Encounter for immunization: Secondary | ICD-10-CM | POA: Diagnosis not present

## 2021-06-23 DIAGNOSIS — Z860101 Personal history of adenomatous and serrated colon polyps: Secondary | ICD-10-CM

## 2021-06-23 DIAGNOSIS — I10 Essential (primary) hypertension: Secondary | ICD-10-CM

## 2021-06-23 DIAGNOSIS — M8580 Other specified disorders of bone density and structure, unspecified site: Secondary | ICD-10-CM

## 2021-06-23 DIAGNOSIS — F32A Depression, unspecified: Secondary | ICD-10-CM

## 2021-06-23 DIAGNOSIS — E785 Hyperlipidemia, unspecified: Secondary | ICD-10-CM

## 2021-06-23 DIAGNOSIS — R61 Generalized hyperhidrosis: Secondary | ICD-10-CM

## 2021-06-23 DIAGNOSIS — Z0000 Encounter for general adult medical examination without abnormal findings: Secondary | ICD-10-CM

## 2021-06-23 DIAGNOSIS — Z0001 Encounter for general adult medical examination with abnormal findings: Secondary | ICD-10-CM | POA: Insufficient documentation

## 2021-06-23 DIAGNOSIS — Z86010 Personal history of colonic polyps: Secondary | ICD-10-CM | POA: Diagnosis not present

## 2021-06-23 DIAGNOSIS — E2839 Other primary ovarian failure: Secondary | ICD-10-CM

## 2021-06-23 DIAGNOSIS — F419 Anxiety disorder, unspecified: Secondary | ICD-10-CM

## 2021-06-23 LAB — COMPREHENSIVE METABOLIC PANEL
ALT: 11 U/L (ref 0–35)
AST: 14 U/L (ref 0–37)
Albumin: 4.2 g/dL (ref 3.5–5.2)
Alkaline Phosphatase: 218 U/L — ABNORMAL HIGH (ref 39–117)
BUN: 20 mg/dL (ref 6–23)
CO2: 28 mEq/L (ref 19–32)
Calcium: 9.2 mg/dL (ref 8.4–10.5)
Chloride: 104 mEq/L (ref 96–112)
Creatinine, Ser: 0.76 mg/dL (ref 0.40–1.20)
GFR: 79.66 mL/min (ref >60.00)
Glucose, Bld: 99 mg/dL (ref 70–99)
Potassium: 4.5 mEq/L (ref 3.5–5.1)
Sodium: 140 mEq/L (ref 135–145)
Total Bilirubin: 0.3 mg/dL (ref 0.2–1.2)
Total Protein: 7 g/dL (ref 6.0–8.3)

## 2021-06-23 LAB — HEMOGLOBIN A1C: Hgb A1c MFr Bld: 6.1 % (ref 4.6–6.5)

## 2021-06-23 LAB — LIPID PANEL
Cholesterol: 145 mg/dL (ref 0–200)
HDL: 43.3 mg/dL (ref >39.00)
NonHDL: 101.7
Total CHOL/HDL Ratio: 3
Triglycerides: 211 mg/dL — ABNORMAL HIGH (ref 0.0–149.0)
VLDL: 42.2 mg/dL — ABNORMAL HIGH (ref 0.0–40.0)

## 2021-06-23 LAB — TSH: TSH: 2.23 u[IU]/mL (ref 0.35–5.50)

## 2021-06-23 LAB — LDL CHOLESTEROL, DIRECT: Direct LDL: 72 mg/dL

## 2021-06-23 MED ORDER — ZOSTER VAC RECOMB ADJUVANTED 50 MCG/0.5ML IM SUSR: 0.5000 mL | Freq: Once | INTRAMUSCULAR | 1 refills | Status: AC

## 2021-06-23 MED ORDER — RIZATRIPTAN BENZOATE 5 MG PO TABS: ORAL_TABLET | ORAL | 0 refills | Status: DC

## 2021-06-23 NOTE — Assessment & Plan Note (Addendum)
Continued, appears to be anxiety related but also suspect underlying sleep apnea.   Referral placed to neurology given sleep disturbance and evaluation for sleep apnea.   Epworth Sleepiness Score of 21

## 2021-06-23 NOTE — Assessment & Plan Note (Addendum)
No recent migraines, insurance will not cover Amerge 2.5 mg.   Will send in rizatriptan 5 mg to use PRN.    She is compliant to Cymbalta 90 mg which was originally prescribed for migraine prevention. Will be reducing to 60 mg due to excessive sweating. She will update.

## 2021-06-23 NOTE — Progress Notes (Signed)
Subjective:    Patient ID: Denise [name], female    DOB: Aug 08, 1951, 70 y.o.   MRN: [id number]  HPI  Denise [name] is a very pleasant 70 y.o. female who presents today for complete physical.  She continues to experience difficulty falling asleep, staying sleep, daytime tiredness. She is managed on Cymbalta 90 mg daily for anxiety. She has recently started exercising. She lives alone, has never been told she snores. She's taken hydroxyzine without much improvement.   Immunizations: -Influenza: Due this season  -Covid-19: 2 vaccines -Shingles: Never completed  -Pneumonia: Prevnar 13 in 2018, due  Diet: [place]. She recently gave up sodas Exercise: No regular exercise.  Eye exam: Completes annually  Dental exam: Completes annually   Mammogram: Completed in May 2021 Dexa: Due Colonoscopy: Completed in 2017, due in 2020, never completed last year when this was scheduled.   BP Readings from Last 3 Encounters:  06/23/21 120/82  06/01/21 116/72  04/13/21 133/83       Review of Systems  Constitutional:  Negative for unexpected weight change.  HENT:  Negative for rhinorrhea.   Eyes:  Negative for visual disturbance.  Respiratory:  Negative for shortness of breath.   Cardiovascular:  Negative for chest pain.  Gastrointestinal:  Negative for constipation and diarrhea.  Genitourinary:  Negative for difficulty urinating.  Musculoskeletal:  Negative for arthralgias and myalgias.  Skin:  Negative for rash.       Excessive sweating   Allergic/Immunologic: Negative for environmental allergies.  Neurological:  Negative for dizziness and headaches.  Psychiatric/Behavioral:  Positive for sleep disturbance. The patient is nervous/anxious.         Past Medical History:  Diagnosis Date   Allergy    seasonal   Arthritis    Bursitis of hip    Left   Constipation    90%of the time c/o constipation, occ has diarrhea - uses stool softener    Endometriosis    Facial  flushing 05/12/2014   Fibroid    GERD (gastroesophageal reflux disease)    Hematochezia    Hiatal hernia    HSV-1 (herpes simplex virus 1) infection    Hyperlipidemia    Hyperplastic colon polyp    Hypertension    Hypothyroidism    Ischemic colitis ([place]) 01/2019   LBBB (left bundle branch block)    Low back pain    Migraine    Osteopenia 2008   -1.2 FEMORAL NECK   Prediabetes    Stroke [facility])    TIA   TIA (transient ischemic attack) 08-10-2013    Social History   Socioeconomic History   Marital status: Divorced    Spouse name: Not on file   Number of children: 2   Years of education: Not on file   Highest education level: Not on file  Occupational History    Employer: RETIRED  Tobacco Use   Smoking status: Never   Smokeless tobacco: Never  Vaping Use   Vaping Use: Never used  Substance and Sexual Activity   Alcohol use: Yes    Alcohol/week: 0.0 standard drinks    Comment: 1 every 3 months   Drug use: No   Sexual activity: Not Currently    Comment: intercourse age 10, more than 5 sexual partners,des neg  Other Topics Concern   Not on file  Social History Narrative   Single.   2 daughters, 4 grandchildren.   Once worked for [employer].   Enjoys Ambulance person.  Social Determinants of Health   Financial Resource Strain: Not on file  Food Insecurity: Not on file  Transportation Needs: Not on file  Physical Activity: Not on file  Stress: Not on file  Social Connections: Not on file  Intimate Partner Violence: Not on file    Past Surgical History:  Procedure Laterality Date   ABDOMINAL HYSTERECTOMY  1989   TAH, [place]  2003   COLONOSCOPY     KNEE SURGERY  2004   right   NASAL SEPTUM SURGERY  2012   [place]  2007   right   THYROID LOBECTOMY  2002    Family History  Problem Relation Age of Onset   Heart disease Father    Cancer Mother        brain  tumor   Breast cancer Maternal Aunt    Parkinson's disease Sister    Colon cancer Neg Hx    Colon polyps Neg Hx    Rectal cancer Neg Hx    Stomach cancer Neg Hx    Thyroid disease Neg Hx    Esophageal cancer Neg Hx     Allergies  Allergen Reactions   Tramadol Other (See Comments)    Reaction: NIGHTMARES    Current Outpatient Medications on File Prior to Visit  Medication Sig Dispense Refill   amLODipine (NORVASC) 10 MG tablet Take 1 tablet (10 mg total) by mouth daily. For blood pressure. 90 tablet 0   aspirin 81 MG tablet Take 81 mg by mouth daily.     atorvastatin (LIPITOR) 10 MG tablet TAKE 1 TABLET BY MOUTH  DAILY FOR CHOLESTEROL 90 tablet 0   DULoxetine (CYMBALTA) 30 MG capsule Take 1 capsule (30 mg total) by mouth daily. For anxiety, depression, headache prevention. Take with 60 mg. 90 capsule 0   DULoxetine (CYMBALTA) 60 MG capsule TAKE 1 CAPSULE BY MOUTH  DAILY FOR HEADACHE  PREVENTION 90 capsule 3   esomeprazole (NEXIUM) 20 MG capsule Take 1 capsule (20 mg total) by mouth every morning. 30 capsule 11   hydrOXYzine (ATARAX/VISTARIL) 10 MG tablet Take 1-2 tablets (10-20 mg total) by mouth at bedtime as needed for anxiety. 30 tablet 0   levothyroxine (SYNTHROID) 75 MCG tablet Take 1 tablet by mouth every morning on an empty stomach with water only.  No food or other medications for 30 minutes. 90 tablet 0   metoprolol succinate (TOPROL-XL) 25 MG 24 hr tablet TAKE 1 TABLET BY MOUTH  DAILY FOR BLOOD PRESSURE 90 tablet 0   naratriptan (AMERGE) 2.5 MG tablet TAKE 1 TABLET BY MOUTH AS NEEDED FOR MIGRAINE. MAY REPEAT IN 4 HOURS IF SYMPTOMS PERSIST. MAX 2 TABLETS IN 24 HOURS 20 tablet 0   No current facility-administered medications on file prior to visit.    BP 120/82   Pulse 82   Temp 98.6 F (37 C) (Temporal)   Ht 5' 4.5" (1.638 m)   Wt 209 lb (94.8 kg)   SpO2 96%   BMI 35.32 kg/m  Objective:   Physical Exam HENT:     Right Ear: Tympanic membrane and ear canal normal.      Left Ear: Tympanic membrane and ear canal normal.     Nose: Nose normal.  Eyes:     Conjunctiva/sclera: Conjunctivae normal.     Pupils: Pupils are equal, round, and reactive to light.  Neck:     Thyroid: No thyromegaly.  Cardiovascular:  Rate and Rhythm: Normal rate and regular rhythm.     Heart sounds: No murmur heard. Pulmonary:     Effort: Pulmonary effort is normal.     Breath sounds: Normal breath sounds. No rales.  Abdominal:     General: Bowel sounds are normal.     Palpations: Abdomen is soft.     Tenderness: There is no abdominal tenderness.  Musculoskeletal:        General: Normal range of motion.     Cervical back: Neck supple.  Lymphadenopathy:     Cervical: No cervical adenopathy.  Skin:    General: Skin is warm and dry.     Findings: No rash.  Neurological:     Mental Status: She is alert and oriented to person, place, and time.     Cranial Nerves: No cranial nerve deficit.     Deep Tendon Reflexes: Reflexes are normal and symmetric.  Psychiatric:        Mood and Affect: Mood normal.          Assessment & Plan:      This visit occurred during the SARS-CoV-2 public health emergency.  Safety protocols were in place, including screening questions prior to the visit, additional usage of staff PPE, and extensive cleaning of exam room while observing appropriate contact time as indicated for disinfecting solutions.

## 2021-06-23 NOTE — Assessment & Plan Note (Signed)
She is taking levothyroxine 75 mcg correctly for the most part. Continue levothyroxine 75 mcg. Repeat TSH pending.   Discussed correct way to take.

## 2021-06-23 NOTE — Assessment & Plan Note (Signed)
Pneumovax due, provided today. Shingrix due, Rx provided.  Colonoscopy overdue, stressed the importance for her to schedule.  Mammogram and bone density due, ordered.  Discussed the importance of a healthy diet and regular exercise in order for weight loss, and to reduce the risk of further co-morbidity.  Exam today stable. Labs pending.

## 2021-06-23 NOTE — Assessment & Plan Note (Signed)
Repeat MRI in 2021 without recurrence or acute issues.

## 2021-06-23 NOTE — Assessment & Plan Note (Signed)
Chronic, overall stable, was recommended by dermatology to reduce/wean off given excessive sweating.   Will wean back down to 60 mg for now. Consider further dose reduction but will need to change to SSRI. She will update.

## 2021-06-23 NOTE — Assessment & Plan Note (Signed)
Discussed to start calcium and vitamin D, increase weight bearing exercise.   Bone density scan due and pending.

## 2021-06-23 NOTE — Assessment & Plan Note (Signed)
Up to Nexium 20 mg BID with active symptoms.  She will mention to GI when she goes for colonoscopy.

## 2021-06-23 NOTE — Assessment & Plan Note (Signed)
Discussed the importance of a healthy diet and regular exercise in order for weight loss, and to reduce the risk of further co-morbidity. ? ?Repeat A1C pending. ?

## 2021-06-23 NOTE — Patient Instructions (Addendum)
You will be contacted regarding your referral to neurology for the sleep study.  Please let us know if you have not been contacted within two weeks.   [facility] your mamm[facility] and bone density scan.  Call the GI doctor to schedule your colonoscopy.   Stop by the lab prior to leaving today. I will notify you of your results once received.    Take the shingles vaccine to your pharmacy.  Stop taking the duloxetine 30 mg for anxiety.  We can wean down further.   It was a pleasure to see you today!

## 2021-06-23 NOTE — Assessment & Plan Note (Signed)
Well controlled in the office today, continue amlodipine 10 mg, metoprolol 25 mg BID.   Labs pending.

## 2021-06-23 NOTE — Assessment & Plan Note (Signed)
Compliant to atorvastatin 10 mg, continue same.  Repeat lipid panel pending.

## 2021-06-23 NOTE — Assessment & Plan Note (Signed)
Overdue for repeat colonoscopy, she promises to schedule. Stressed the importance of follow up.

## 2021-06-23 NOTE — Assessment & Plan Note (Signed)
Following with dermatology, no obvious cause for symptoms as of yet. Dermatology recommended to wean down on Cymbalta.   She will reduce Cymbalta down to 60 mg from 90 mg. She will update.

## 2021-06-26 DIAGNOSIS — J342 Deviated nasal septum: Secondary | ICD-10-CM | POA: Diagnosis not present

## 2021-06-26 DIAGNOSIS — J341 Cyst and mucocele of nose and nasal sinus: Secondary | ICD-10-CM | POA: Diagnosis not present

## 2021-06-26 DIAGNOSIS — J310 Chronic rhinitis: Secondary | ICD-10-CM | POA: Diagnosis not present

## 2021-06-26 DIAGNOSIS — J324 Chronic pansinusitis: Secondary | ICD-10-CM | POA: Diagnosis not present

## 2021-06-26 DIAGNOSIS — J343 Hypertrophy of nasal turbinates: Secondary | ICD-10-CM | POA: Diagnosis not present

## 2021-06-26 DIAGNOSIS — J329 Chronic sinusitis, unspecified: Secondary | ICD-10-CM | POA: Diagnosis not present

## 2021-06-26 DIAGNOSIS — J3489 Other specified disorders of nose and nasal sinuses: Secondary | ICD-10-CM | POA: Diagnosis not present

## 2021-06-26 NOTE — Addendum Note (Signed)
Addended by: Francella Solian on: 06/26/2021 09:56 AM   Modules accepted: Orders

## 2021-06-29 ENCOUNTER — Other Ambulatory Visit: Payer: Self-pay | Admitting: Primary Care

## 2021-06-29 DIAGNOSIS — F32A Depression, unspecified: Secondary | ICD-10-CM

## 2021-07-23 ENCOUNTER — Other Ambulatory Visit: Payer: Self-pay | Admitting: Primary Care

## 2021-07-23 DIAGNOSIS — I10 Essential (primary) hypertension: Secondary | ICD-10-CM

## 2021-07-31 ENCOUNTER — Other Ambulatory Visit: Payer: Self-pay

## 2021-07-31 ENCOUNTER — Ambulatory Visit (INDEPENDENT_AMBULATORY_CARE_PROVIDER_SITE_OTHER): Payer: Medicare Other | Admitting: Family Medicine

## 2021-07-31 VITALS — BP 120/76 | HR 83 | Temp 97.8°F | Wt 209.5 lb

## 2021-07-31 DIAGNOSIS — N644 Mastodynia: Secondary | ICD-10-CM | POA: Diagnosis not present

## 2021-07-31 DIAGNOSIS — M542 Cervicalgia: Secondary | ICD-10-CM | POA: Diagnosis not present

## 2021-07-31 NOTE — Progress Notes (Signed)
Subjective:     Harriette Gollihar is a 70 y.o. female presenting for Breast Pain (Center of L x 3 weeks ) and Neck Pain (R side x 1 day )     Neck Pain  Pertinent negatives include no fever.   #Breast pain - Left - centrally located - near the nipple - worse with turning over - hx of diagnostic mammo last year  - 4 weeks of symptoms - mammogram is not until January  - hx of pain in the breast - lumpy and tenderness - has reduced caffeine w/o improvement - hx of cyst removal from left breast 20 years ago  #neck pain  - right side - near the ear - Saturday - tender lymph node and swelling   Review of Systems  Constitutional:  Negative for chills, fatigue and fever.  HENT:  Negative for congestion, sinus pressure, sinus pain and sore throat.   Respiratory:  Negative for cough and shortness of breath.   Musculoskeletal:  Positive for neck pain.    Social History   Tobacco Use  Smoking Status Never  Smokeless Tobacco Never        Objective:    BP Readings from Last 3 Encounters:  07/31/21 120/76  06/23/21 120/82  06/01/21 116/72   Wt Readings from Last 3 Encounters:  07/31/21 209 lb 8 oz (95 kg)  06/23/21 209 lb (94.8 kg)  06/01/21 212 lb 4 oz (96.3 kg)    BP 120/76   Pulse 83   Temp 97.8 F (36.6 C) (Temporal)   Wt 209 lb 8 oz (95 kg)   SpO2 97%   BMI 35.41 kg/m    Physical Exam Exam conducted with a chaperone present.  Constitutional:      General: She is not in acute distress.    Appearance: She is well-developed. She is not diaphoretic.  HENT:     Right Ear: Tympanic membrane and external ear normal.     Left Ear: Tympanic membrane and external ear normal.     Nose: Nose normal.  Eyes:     Conjunctiva/sclera: Conjunctivae normal.  Neck:     Comments: Some fullness to the right neck without distinct mass or lesion Cardiovascular:     Rate and Rhythm: Normal rate.  Pulmonary:     Effort: Pulmonary effort is normal.  Chest:   Breasts:    Breasts are symmetrical.     Right: No inverted nipple, mass or skin change.     Left: Tenderness present. No inverted nipple, mass or skin change.  Musculoskeletal:     Cervical back: Neck supple. Tenderness (on the right side) present.  Skin:    General: Skin is warm and dry.     Capillary Refill: Capillary refill takes less than 2 seconds.  Neurological:     Mental Status: She is alert. Mental status is at baseline.  Psychiatric:        Mood and Affect: Mood normal.        Behavior: Behavior normal.          Assessment & Plan:   Problem List Items Addressed This Visit       Other   Breast pain, left - Primary    Hx of diagnostic mammogram which was reassuring but >1 year ago. New pain and ttp w/o distinct lesion. Discussed voltaren gel and getting imaging to evaluate further. If imaging normal, may benefit from OB/gyn consult given persistent symptoms.  Relevant Orders   MM DIAG BREAST TOMO BILATERAL   US BREAST LTD UNI LEFT INC AXILLA   Neck pain on right side    Pt with chronic neck pain and now new swelling. No distinct lesion and new onset. Recommend watch and wait and topical volteran for pain        Return if symptoms worsen or fail to improve.  Lesleigh Noe, MD  This visit occurred during the SARS-CoV-2 public health emergency.  Safety protocols were in place, including screening questions prior to the visit, additional usage of staff PPE, and extensive cleaning of exam room while observing appropriate contact time as indicated for disinfecting solutions.

## 2021-07-31 NOTE — Patient Instructions (Signed)
#  Neck pain - Monitor for worsening pain, swelling, or new symptoms - fevers, chills  - Recommend topical Voltaren Gel  #Breast pain - Topical voltaren gel - Diagnostic mammogram - you will be called

## 2021-07-31 NOTE — Assessment & Plan Note (Signed)
Hx of diagnostic mammogram which was reassuring but >1 year ago. New pain and ttp w/o distinct lesion. Discussed voltaren gel and getting imaging to evaluate further. If imaging normal, may benefit from OB/gyn consult given persistent symptoms.

## 2021-07-31 NOTE — Assessment & Plan Note (Signed)
Pt with chronic neck pain and now new swelling. No distinct lesion and new onset. Recommend watch and wait and topical volteran for pain

## 2021-08-11 ENCOUNTER — Encounter (HOSPITAL_COMMUNITY): Payer: Self-pay | Admitting: Emergency Medicine

## 2021-08-11 ENCOUNTER — Inpatient Hospital Stay (HOSPITAL_COMMUNITY)
Admission: EM | Admit: 2021-08-11 | Discharge: 2021-08-15 | DRG: 917 | Disposition: A | Discharge status: Other Acute Inpatient Hospital | Payer: Medicare Other | Attending: Family Medicine | Admitting: Family Medicine

## 2021-08-11 ENCOUNTER — Emergency Department (HOSPITAL_COMMUNITY): Payer: Medicare Other

## 2021-08-11 ENCOUNTER — Inpatient Hospital Stay (HOSPITAL_COMMUNITY): Payer: Medicare Other

## 2021-08-11 ENCOUNTER — Other Ambulatory Visit: Payer: Self-pay

## 2021-08-11 DIAGNOSIS — Z885 Allergy status to narcotic agent status: Secondary | ICD-10-CM | POA: Diagnosis not present

## 2021-08-11 DIAGNOSIS — M25552 Pain in left hip: Secondary | ICD-10-CM | POA: Diagnosis present

## 2021-08-11 DIAGNOSIS — I499 Cardiac arrhythmia, unspecified: Secondary | ICD-10-CM | POA: Diagnosis not present

## 2021-08-11 DIAGNOSIS — I10 Essential (primary) hypertension: Secondary | ICD-10-CM | POA: Diagnosis present

## 2021-08-11 DIAGNOSIS — T424X2A Poisoning by benzodiazepines, intentional self-harm, initial encounter: Secondary | ICD-10-CM | POA: Diagnosis not present

## 2021-08-11 DIAGNOSIS — G43909 Migraine, unspecified, not intractable, without status migrainosus: Secondary | ICD-10-CM | POA: Diagnosis not present

## 2021-08-11 DIAGNOSIS — I447 Left bundle-branch block, unspecified: Secondary | ICD-10-CM | POA: Diagnosis not present

## 2021-08-11 DIAGNOSIS — F32A Depression, unspecified: Secondary | ICD-10-CM | POA: Diagnosis not present

## 2021-08-11 DIAGNOSIS — Y92009 Unspecified place in unspecified non-institutional (private) residence as the place of occurrence of the external cause: Secondary | ICD-10-CM | POA: Diagnosis not present

## 2021-08-11 DIAGNOSIS — Z7982 Long term (current) use of aspirin: Secondary | ICD-10-CM

## 2021-08-11 DIAGNOSIS — T424X1A Poisoning by benzodiazepines, accidental (unintentional), initial encounter: Secondary | ICD-10-CM

## 2021-08-11 DIAGNOSIS — E890 Postprocedural hypothyroidism: Secondary | ICD-10-CM | POA: Diagnosis present

## 2021-08-11 DIAGNOSIS — E785 Hyperlipidemia, unspecified: Secondary | ICD-10-CM | POA: Diagnosis present

## 2021-08-11 DIAGNOSIS — K219 Gastro-esophageal reflux disease without esophagitis: Secondary | ICD-10-CM | POA: Diagnosis present

## 2021-08-11 DIAGNOSIS — R52 Pain, unspecified: Secondary | ICD-10-CM | POA: Diagnosis present

## 2021-08-11 DIAGNOSIS — Z8673 Personal history of transient ischemic attack (TIA), and cerebral infarction without residual deficits: Secondary | ICD-10-CM | POA: Diagnosis not present

## 2021-08-11 DIAGNOSIS — Z8249 Family history of ischemic heart disease and other diseases of the circulatory system: Secondary | ICD-10-CM

## 2021-08-11 DIAGNOSIS — Z20822 Contact with and (suspected) exposure to covid-19: Secondary | ICD-10-CM | POA: Diagnosis present

## 2021-08-11 DIAGNOSIS — M8580 Other specified disorders of bone density and structure, unspecified site: Secondary | ICD-10-CM | POA: Diagnosis present

## 2021-08-11 DIAGNOSIS — Z743 Need for continuous supervision: Secondary | ICD-10-CM | POA: Diagnosis not present

## 2021-08-11 DIAGNOSIS — R4182 Altered mental status, unspecified: Secondary | ICD-10-CM

## 2021-08-11 DIAGNOSIS — F419 Anxiety disorder, unspecified: Secondary | ICD-10-CM | POA: Diagnosis present

## 2021-08-11 DIAGNOSIS — T50901A Poisoning by unspecified drugs, medicaments and biological substances, accidental (unintentional), initial encounter: Secondary | ICD-10-CM | POA: Diagnosis not present

## 2021-08-11 DIAGNOSIS — D352 Benign neoplasm of pituitary gland: Secondary | ICD-10-CM | POA: Diagnosis not present

## 2021-08-11 DIAGNOSIS — R404 Transient alteration of awareness: Secondary | ICD-10-CM | POA: Diagnosis not present

## 2021-08-11 DIAGNOSIS — T424X4A Poisoning by benzodiazepines, undetermined, initial encounter: Secondary | ICD-10-CM | POA: Diagnosis not present

## 2021-08-11 DIAGNOSIS — G928 Other toxic encephalopathy: Secondary | ICD-10-CM | POA: Diagnosis not present

## 2021-08-11 DIAGNOSIS — G4700 Insomnia, unspecified: Secondary | ICD-10-CM | POA: Diagnosis present

## 2021-08-11 DIAGNOSIS — R6889 Other general symptoms and signs: Secondary | ICD-10-CM | POA: Diagnosis not present

## 2021-08-11 DIAGNOSIS — Z79890 Hormone replacement therapy (postmenopausal): Secondary | ICD-10-CM | POA: Diagnosis not present

## 2021-08-11 DIAGNOSIS — F13129 Sedative, hypnotic or anxiolytic abuse with intoxication, unspecified: Secondary | ICD-10-CM | POA: Diagnosis not present

## 2021-08-11 DIAGNOSIS — R0789 Other chest pain: Secondary | ICD-10-CM | POA: Diagnosis not present

## 2021-08-11 DIAGNOSIS — Z79899 Other long term (current) drug therapy: Secondary | ICD-10-CM

## 2021-08-11 DIAGNOSIS — T50904A Poisoning by unspecified drugs, medicaments and biological substances, undetermined, initial encounter: Secondary | ICD-10-CM

## 2021-08-11 DIAGNOSIS — Z90710 Acquired absence of both cervix and uterus: Secondary | ICD-10-CM | POA: Diagnosis not present

## 2021-08-11 DIAGNOSIS — R079 Chest pain, unspecified: Secondary | ICD-10-CM

## 2021-08-11 DIAGNOSIS — I6782 Cerebral ischemia: Secondary | ICD-10-CM | POA: Diagnosis not present

## 2021-08-11 DIAGNOSIS — E039 Hypothyroidism, unspecified: Secondary | ICD-10-CM | POA: Diagnosis present

## 2021-08-11 HISTORY — DX: Poisoning by benzodiazepines, accidental (unintentional), initial encounter: T42.4X1A

## 2021-08-11 LAB — TROPONIN I (HIGH SENSITIVITY)
Troponin I (High Sensitivity): 5 ng/L (ref <18)
Troponin I (High Sensitivity): 6 ng/L (ref <18)

## 2021-08-11 LAB — URINALYSIS, ROUTINE W REFLEX MICROSCOPIC
Bilirubin Urine: NEGATIVE
Glucose, UA: NEGATIVE mg/dL
Hgb urine dipstick: NEGATIVE
Ketones, ur: NEGATIVE mg/dL
Leukocytes,Ua: NEGATIVE
Nitrite: NEGATIVE
Protein, ur: NEGATIVE mg/dL
Specific Gravity, Urine: 1.006 (ref 1.005–1.030)
pH: 6 (ref 5.0–8.0)

## 2021-08-11 LAB — RAPID URINE DRUG SCREEN, HOSP PERFORMED
Amphetamines: NOT DETECTED
Barbiturates: NOT DETECTED
Benzodiazepines: POSITIVE — AB
Cocaine: NOT DETECTED
Opiates: NOT DETECTED
Tetrahydrocannabinol: NOT DETECTED

## 2021-08-11 LAB — COMPREHENSIVE METABOLIC PANEL
ALT: 15 U/L (ref 0–44)
AST: 18 U/L (ref 15–41)
Albumin: 3.4 g/dL — ABNORMAL LOW (ref 3.5–5.0)
Alkaline Phosphatase: 156 U/L — ABNORMAL HIGH (ref 38–126)
Anion gap: 10 (ref 5–15)
BUN: 10 mg/dL (ref 8–23)
CO2: 23 mmol/L (ref 22–32)
Calcium: 9.2 mg/dL (ref 8.9–10.3)
Chloride: 106 mmol/L (ref 98–111)
Creatinine, Ser: 0.73 mg/dL (ref 0.44–1.00)
GFR, Estimated: >60 mL/min (ref >60)
Glucose, Bld: 120 mg/dL — ABNORMAL HIGH (ref 70–99)
Potassium: 3.6 mmol/L (ref 3.5–5.1)
Sodium: 139 mmol/L (ref 135–145)
Total Bilirubin: 0.5 mg/dL (ref 0.3–1.2)
Total Protein: 6.4 g/dL — ABNORMAL LOW (ref 6.5–8.1)

## 2021-08-11 LAB — CBC WITH DIFFERENTIAL/PLATELET
Abs Immature Granulocytes: 0.02 10*3/uL (ref 0.00–0.07)
Basophils Absolute: 0 10*3/uL (ref 0.0–0.1)
Basophils Relative: 1 %
Eosinophils Absolute: 0.5 10*3/uL (ref 0.0–0.5)
Eosinophils Relative: 7 %
HCT: 42.2 % (ref 36.0–46.0)
Hemoglobin: 13.4 g/dL (ref 12.0–15.0)
Immature Granulocytes: 0 %
Lymphocytes Relative: 29 %
Lymphs Abs: 2.1 10*3/uL (ref 0.7–4.0)
MCH: 26.4 pg (ref 26.0–34.0)
MCHC: 31.8 g/dL (ref 30.0–36.0)
MCV: 83.2 fL (ref 80.0–100.0)
Monocytes Absolute: 0.5 10*3/uL (ref 0.1–1.0)
Monocytes Relative: 6 %
Neutro Abs: 4.1 10*3/uL (ref 1.7–7.7)
Neutrophils Relative %: 57 %
Platelets: 294 10*3/uL (ref 150–400)
RBC: 5.07 MIL/uL (ref 3.87–5.11)
RDW: 14 % (ref 11.5–15.5)
WBC: 7.3 10*3/uL (ref 4.0–10.5)
nRBC: 0 % (ref 0.0–0.2)

## 2021-08-11 LAB — TSH: TSH: 2.807 u[IU]/mL (ref 0.350–4.500)

## 2021-08-11 LAB — ETHANOL: Alcohol, Ethyl (B): <10 mg/dL (ref <10)

## 2021-08-11 LAB — T4, FREE: Free T4: 0.89 ng/dL (ref 0.61–1.12)

## 2021-08-11 LAB — SALICYLATE LEVEL: Salicylate Lvl: <7 mg/dL — ABNORMAL LOW (ref 7.0–30.0)

## 2021-08-11 LAB — ACETAMINOPHEN LEVEL: Acetaminophen (Tylenol), Serum: <10 ug/mL — ABNORMAL LOW (ref 10–30)

## 2021-08-11 LAB — MAGNESIUM: Magnesium: 2 mg/dL (ref 1.7–2.4)

## 2021-08-11 LAB — RESP PANEL BY RT-PCR (FLU A&B, COVID) ARPGX2
Influenza A by PCR: NEGATIVE
Influenza B by PCR: NEGATIVE
SARS Coronavirus 2 by RT PCR: NEGATIVE

## 2021-08-11 LAB — CBG MONITORING, ED: Glucose-Capillary: 116 mg/dL — ABNORMAL HIGH (ref 70–99)

## 2021-08-11 MED ORDER — HYDROXYZINE HCL 10 MG PO TABS: 10.0000 mg | ORAL_TABLET | Freq: Every evening | ORAL | Status: DC | PRN

## 2021-08-11 MED ORDER — PANTOPRAZOLE SODIUM 40 MG PO TBEC
40.0000 mg | DELAYED_RELEASE_TABLET | Freq: Every day | ORAL | Status: DC
  Administered 2021-08-11 – 2021-08-15 (×5): 40 mg via ORAL
  Filled 2021-08-11 (×5): qty 1

## 2021-08-11 MED ORDER — LEVOTHYROXINE SODIUM 75 MCG PO TABS
75.0000 ug | ORAL_TABLET | Freq: Every day | ORAL | Status: DC
  Administered 2021-08-12 – 2021-08-15 (×4): 75 ug via ORAL
  Filled 2021-08-11 (×4): qty 1

## 2021-08-11 MED ORDER — DULOXETINE HCL 60 MG PO CPEP: 60.0000 mg | ORAL_CAPSULE | Freq: Every day | ORAL | Status: DC

## 2021-08-11 MED ORDER — AMLODIPINE BESYLATE 10 MG PO TABS
10.0000 mg | ORAL_TABLET | Freq: Every day | ORAL | Status: DC
  Administered 2021-08-11 – 2021-08-15 (×5): 10 mg via ORAL
  Filled 2021-08-11 (×4): qty 1
  Filled 2021-08-11: qty 2

## 2021-08-11 MED ORDER — SUMATRIPTAN SUCCINATE 50 MG PO TABS: 50.0000 mg | ORAL_TABLET | ORAL | Status: DC | PRN

## 2021-08-11 MED ORDER — SODIUM CHLORIDE 0.9 % IV BOLUS
1000.0000 mL | Freq: Once | INTRAVENOUS | Status: AC
  Administered 2021-08-11: 1000 mL via INTRAVENOUS

## 2021-08-11 MED ORDER — BUTALBITAL-APAP-CAFFEINE 50-325-40 MG PO TABS
1.0000 | ORAL_TABLET | Freq: Four times a day (QID) | ORAL | Status: DC | PRN
  Administered 2021-08-11 – 2021-08-13 (×4): 1 via ORAL
  Filled 2021-08-11 (×4): qty 1

## 2021-08-11 MED ORDER — ACETAMINOPHEN 325 MG PO TABS: 650.0000 mg | ORAL_TABLET | Freq: Four times a day (QID) | ORAL | Status: DC | PRN

## 2021-08-11 MED ORDER — ENOXAPARIN SODIUM 40 MG/0.4ML IJ SOSY
40.0000 mg | PREFILLED_SYRINGE | INTRAMUSCULAR | Status: DC
  Administered 2021-08-11 – 2021-08-15 (×5): 40 mg via SUBCUTANEOUS
  Filled 2021-08-11 (×5): qty 0.4

## 2021-08-11 MED ORDER — POTASSIUM CHLORIDE 10 MEQ/100ML IV SOLN
10.0000 meq | INTRAVENOUS | Status: AC
Start: 2021-08-11 — End: 2021-08-11
  Administered 2021-08-11: 10 meq via INTRAVENOUS
  Filled 2021-08-11 (×2): qty 100

## 2021-08-11 MED ORDER — POTASSIUM CHLORIDE CRYS ER 20 MEQ PO TBCR
40.0000 meq | EXTENDED_RELEASE_TABLET | Freq: Once | ORAL | Status: AC
  Administered 2021-08-11: 40 meq via ORAL
  Filled 2021-08-11: qty 2

## 2021-08-11 MED ORDER — MELATONIN 5 MG PO TABS
5.0000 mg | ORAL_TABLET | Freq: Every day | ORAL | Status: DC
  Administered 2021-08-11 – 2021-08-13 (×3): 5 mg via ORAL
  Filled 2021-08-11 (×3): qty 1

## 2021-08-11 MED ORDER — ONDANSETRON HCL 4 MG/2ML IJ SOLN: 4.0000 mg | Freq: Four times a day (QID) | INTRAMUSCULAR | Status: DC | PRN

## 2021-08-11 MED ORDER — ASPIRIN EC 81 MG PO TBEC
81.0000 mg | DELAYED_RELEASE_TABLET | Freq: Every day | ORAL | Status: DC
  Administered 2021-08-12 – 2021-08-15 (×4): 81 mg via ORAL
  Filled 2021-08-11 (×4): qty 1

## 2021-08-11 MED ORDER — DULOXETINE HCL 60 MG PO CPEP
60.0000 mg | ORAL_CAPSULE | Freq: Every day | ORAL | Status: DC
  Administered 2021-08-12 – 2021-08-15 (×4): 60 mg via ORAL
  Filled 2021-08-11 (×4): qty 1

## 2021-08-11 MED ORDER — ONDANSETRON HCL 4 MG PO TABS: 4.0000 mg | ORAL_TABLET | Freq: Four times a day (QID) | ORAL | Status: DC | PRN

## 2021-08-11 MED ORDER — ATORVASTATIN CALCIUM 10 MG PO TABS: 10.0000 mg | ORAL_TABLET | Freq: Every day | ORAL | Status: DC

## 2021-08-11 MED ORDER — ATORVASTATIN CALCIUM 10 MG PO TABS
10.0000 mg | ORAL_TABLET | Freq: Every day | ORAL | Status: DC
  Administered 2021-08-12 – 2021-08-15 (×4): 10 mg via ORAL
  Filled 2021-08-11 (×4): qty 1

## 2021-08-11 MED ORDER — LORAZEPAM 2 MG/ML IJ SOLN: 2.0000 mg | Freq: Once | INTRAMUSCULAR | Status: DC | PRN

## 2021-08-11 MED ORDER — METOPROLOL SUCCINATE ER 25 MG PO TB24
25.0000 mg | ORAL_TABLET | Freq: Every day | ORAL | Status: DC
  Administered 2021-08-12 – 2021-08-15 (×4): 25 mg via ORAL
  Filled 2021-08-11 (×4): qty 1

## 2021-08-11 MED ORDER — LORAZEPAM 2 MG/ML IJ SOLN
1.0000 mg | INTRAMUSCULAR | Status: DC | PRN
  Administered 2021-08-11: 1 mg via INTRAVENOUS
  Filled 2021-08-11: qty 1

## 2021-08-11 MED ORDER — POTASSIUM CHLORIDE 10 MEQ/100ML IV SOLN
10.0000 meq | INTRAVENOUS | Status: AC
  Administered 2021-08-11: 10 meq via INTRAVENOUS

## 2021-08-11 NOTE — ED Provider Notes (Signed)
[facility] EMERGENCY DEPARTMENT Provider Note   CSN: [id number] Arrival date & time: 08/11/21  [id number]     History Chief Complaint  Patient presents with   Drug Overdose    Denise [name] is a 71 y.o. female presenting from home by EMS concern for altered mental status.  EMS reports that the patient had told her sig other that she took 10 x 0.5 mg Xanax tablets last night (total 5 mg), which is a prescription that belongs to another family member.  This morning the patient appeared somnolent and confused.  EMS was called to the scene.  They report her vital signs of been stable, with some borderline hypertension, normal glucose in route to the hospital.  Report the patient had GCS 8, initially not talking to them, although she can mumble on arrival.   EMS provided a list of her medications which are consistent with the current list on epic, including Cymbalta, atorvastatin, aspirin, amlodipine, Atarax, levothyroxine, metoprolol, and Maxalt.  The patient is confused on arrival and a very poor historian.  She does confirm that she took Xanax last night ("pink pills," which EMS confirmed looked like the xanax tablets), and states that she has a headache.  But cannot provide any further history at this time.    Her significant other Morton Peters) is present at bedside.  Her daughter Minerva Ends tells me the patient does suffer from anxiety, has had a prior hospitalization 15 years ago for SI, no known suicide attempts.  The patient had been arguing with her other daughter this week and was frustrated, but did not express SI to any family members.   HPI     Past Medical History:  Diagnosis Date   Allergy    seasonal   Arthritis    Bursitis of hip    Left   Constipation    90%of the time c/o constipation, occ has diarrhea - uses stool softener    Endometriosis    Facial flushing 05/12/2014   Fibroid    GERD (gastroesophageal reflux disease)    Hematochezia     Hiatal hernia    HSV-1 (herpes simplex virus 1) infection    Hyperlipidemia    Hyperplastic colon polyp    Hypertension    Hypothyroidism    Ischemic colitis ([place]) 01/2019   LBBB (left bundle branch block)    Low back pain    Migraine    Osteopenia 2008   -1.2 FEMORAL NECK   Prediabetes    Stroke [facility])    TIA   TIA (transient ischemic attack) 08-10-2013    Patient Active Problem List   Diagnosis Date Noted   Overdose of benzodiazepine 08/11/2021   Benzodiazepine intoxication ([place])    Neck pain on right side 07/31/2021   Preventative health care 06/23/2021   Sleep disturbance 06/01/2021   Anxiety and depression 11/02/2020   Dysuria 11/02/2020   Lumbosacral spondylosis with radiculopathy 10/20/2020   Breast pain, left 04/18/2020   Elevated alkaline phosphatase level 02/11/2020   Diverticulosis of colon without hemorrhage 01/21/2020   Hx of adenomatous colonic polyps 01/21/2020   Left lower quadrant abdominal pain 10/22/2019   Excessive sweating 10/13/2019   Prediabetes 10/13/2019   Hyperlipidemia 02/02/2019   Colitis 01/26/2019   Chest pain 02/25/2017   Welcome to Medicare preventive visit 08/25/2015   Essential hypertension 08/25/2015   Pituitary adenoma ([place]) 05/12/2014   GERD (gastroesophageal reflux disease) 09/01/2012   Hypothyroidism    Osteopenia  Migraine    Fibroid    HSV-1 (herpes simplex virus 1) infection     Past Surgical History:  Procedure Laterality Date   ABDOMINAL HYSTERECTOMY  1989   TAH, BSO   CHOLECYSTECTOMY  2003   COLONOSCOPY     KNEE SURGERY  2004   right   NASAL SEPTUM SURGERY  2012   PITUITARY SURGERY  2001   POLYPECTOMY     SHOULDER SURGERY  2007   right   THYROID LOBECTOMY  2002     OB History     Gravida  2   Para  2   Term      Preterm      AB  0   Living  2      SAB      IAB      Ectopic      Multiple      Live Births              Family History  Problem Relation Age of Onset   Heart  disease Father    Cancer Mother        brain tumor   Breast cancer Maternal Aunt    Parkinson's disease Sister    Colon cancer Neg Hx    Colon polyps Neg Hx    Rectal cancer Neg Hx    Stomach cancer Neg Hx    Thyroid disease Neg Hx    Esophageal cancer Neg Hx     Social History   Tobacco Use   Smoking status: Never   Smokeless tobacco: Never  Vaping Use   Vaping Use: Never used  Substance Use Topics   Alcohol use: Yes    Alcohol/week: 0.0 standard drinks    Comment: 1 every 3 months   Drug use: No    Home Medications Prior to Admission medications   Medication Sig Start Date End Date Taking? Authorizing Provider  amLODipine (NORVASC) 10 MG tablet TAKE 1 TABLET BY MOUTH  DAILY FOR BLOOD PRESSURE 07/23/21   Pleas Koch, NP  aspirin 81 MG tablet Take 81 mg by mouth daily.    [provider]  atorvastatin (LIPITOR) 10 MG tablet TAKE 1 TABLET BY MOUTH  DAILY FOR CHOLESTEROL 06/08/21   Pleas Koch, NP  DULoxetine (CYMBALTA) 60 MG capsule TAKE 1 CAPSULE BY MOUTH  DAILY FOR HEADACHE  PREVENTION 12/23/20   Pleas Koch, NP  esomeprazole (NEXIUM) 20 MG capsule Take 1 capsule (20 mg total) by mouth every morning. 01/21/20   Esterwood, Amy S, PA-C  hydrOXYzine (ATARAX/VISTARIL) 10 MG tablet Take 1-2 tablets (10-20 mg total) by mouth at bedtime as needed for anxiety. 06/01/21   Pleas Koch, NP  levothyroxine (SYNTHROID) 75 MCG tablet Take 1 tablet by mouth every morning on an empty stomach with water only.  No food or other medications for 30 minutes. 06/08/21   Pleas Koch, NP  metoprolol succinate (TOPROL-XL) 25 MG 24 hr tablet TAKE 1 TABLET BY MOUTH  DAILY FOR BLOOD PRESSURE 06/16/21   Pleas Koch, NP  rizatriptan (MAXALT) 5 MG tablet Take 1 tablet by mouth at migraine onset. May repeat with 1 tablet in 2 hours if needed. 06/23/21   Pleas Koch, NP    Allergies    Tramadol  Review of Systems   Review of Systems  Unable to perform ROS:  Mental status change (level 5 caveat)   Physical Exam Updated Vital Signs BP (!) 142/99  Pulse 94   Temp 97.6 F (36.4 C) (Oral)   Resp 16   SpO2 97%   Physical Exam Constitutional:      General: She is not in acute distress.    Appearance: She is obese.     Comments: Appears intoxicated, confused  HENT:     Head: Normocephalic and atraumatic.  Eyes:     Conjunctiva/sclera: Conjunctivae normal.     Pupils: Pupils are equal, round, and reactive to light.  Cardiovascular:     Rate and Rhythm: Normal rate and regular rhythm.     Pulses: Normal pulses.  Pulmonary:     Effort: Pulmonary effort is normal. No respiratory distress.  Abdominal:     General: There is no distension.     Tenderness: There is no abdominal tenderness.  Skin:    General: Skin is warm and dry.  Neurological:     General: No focal deficit present.     Mental Status: She is alert. Mental status is at baseline.     GCS: GCS eye subscore is 4. GCS verbal subscore is 4. GCS motor subscore is 6.     Cranial Nerves: No facial asymmetry.     Comments: Slow, mumbled speech Wiggles arms and toes    ED Results / Procedures / Treatments   Labs (all labs ordered are listed, but only abnormal results are displayed) Labs Reviewed  COMPREHENSIVE METABOLIC PANEL - Abnormal; Notable for the following components:      Result Value   Glucose, Bld 120 (*)    Total Protein 6.4 (*)    Albumin 3.4 (*)    Alkaline Phosphatase 156 (*)    All other components within normal limits  ACETAMINOPHEN LEVEL - Abnormal; Notable for the following components:   Acetaminophen (Tylenol), Serum <10 (*)    All other components within normal limits  SALICYLATE LEVEL - Abnormal; Notable for the following components:   Salicylate Lvl [id number] (*)    All other components within normal limits  RAPID URINE DRUG SCREEN, HOSP PERFORMED - Abnormal; Notable for the following components:   Benzodiazepines POSITIVE (*)    All other components  within normal limits  CBG MONITORING, ED - Abnormal; Notable for the following components:   Glucose-Capillary 116 (*)    All other components within normal limits  RESP PANEL BY RT-PCR (FLU A&B, COVID) ARPGX2  ETHANOL  URINALYSIS, ROUTINE W REFLEX MICROSCOPIC  CBC WITH DIFFERENTIAL/PLATELET  TSH  T4, FREE  MAGNESIUM  HIV ANTIBODY (ROUTINE TESTING W REFLEX)  COMPREHENSIVE METABOLIC PANEL  CBG MONITORING, ED  TROPONIN I (HIGH SENSITIVITY)  TROPONIN I (HIGH SENSITIVITY)    EKG EKG Interpretation  Date/Time:  Friday August 11 2021 11:06:59 EDT Ventricular Rate:  92 PR Interval:  164 QRS Duration: 141 QT Interval:  420 QTC Calculation: 520 R Axis:   -45 Text Interpretation: Sinus rhythm Left bundle branch block No significant change from prior ecg Confirmed by Octaviano Glow [phone]) on 08/11/2021 11:10:54 AM Also confirmed by Octaviano Glow [phone]), editor Hattie Perch ([id number])  on 08/11/2021 11:17:38 AM  Radiology CT HEAD WO CONTRAST (5MM)  Result Date: 08/11/2021 CLINICAL DATA:  70 year old female with history of altered mental status. EXAM: CT HEAD WITHOUT CONTRAST TECHNIQUE: Contiguous axial images were obtained from the base of the skull through the vertex without intravenous contrast. COMPARISON:  Brain MRI 03/15/2020. FINDINGS: Brain: Patchy areas of decreased attenuation are noted throughout the deep and periventricular white matter of the cerebral hemispheres bilaterally, compatible with  mild chronic microvascular ischemic disease. No evidence of acute infarction, hemorrhage, hydrocephalus, extra-axial collection or mass lesion/mass effect. Vascular: No hyperdense vessel or unexpected calcification. Skull: Normal. Negative for fracture or focal lesion. Sinuses/Orbits: No acute finding. Small amount of mucosal thickening in the maxillary sinuses bilaterally. In the anterior aspect of the right maxillary sinus there is a 1.6 cm mucosal retention cyst or polyp. Other: None.  IMPRESSION: 1. No acute intracranial abnormalities. 2. Mild chronic microvascular ischemic changes in the cerebral white matter again noted. Electronically Signed   By: Vinnie Langton M.D.   On: 08/11/2021 10:04   DG Chest Portable 1 View  Result Date: 08/11/2021 CLINICAL DATA:  70 year old female with history of altered mental status. Drug overdose. EXAM: PORTABLE CHEST 1 VIEW COMPARISON:  Chest x-ray 04/19/2011. FINDINGS: Lung volumes are slightly low. Ill-defined opacities at the left base. Widespread interstitial prominence and diffuse peribronchial cuffing. Possible trace left pleural effusion. No right pleural effusion. No pneumothorax. No evidence of pulmonary edema. Heart size is borderline enlarged. Upper mediastinal contours are within normal limits. IMPRESSION: 1. Diffuse peribronchial cuffing and interstitial prominence may suggest an acute bronchitis. Ill-defined opacities at the left lung base may reflect atelectasis and/or developing airspace consolidation. 2. Possible trace left pleural effusion. Electronically Signed   By: Vinnie Langton M.D.   On: 08/11/2021 09:37   DG HIP UNILAT WITH PELVIS 2-3 VIEWS LEFT  Result Date: 08/11/2021 CLINICAL DATA:  Left hip pain EXAM: DG HIP (WITH OR WITHOUT PELVIS) 2-3V LEFT COMPARISON:  None. FINDINGS: Single AP view of the pelvis demonstrates mild degenerative changes of the bilateral SI joints. Pubic symphysis is unremarkable. Visualized sacrum is intact. Single AP view of the right hip demonstrates no acute osseous abnormality. Calcifications in the pelvis, likely phleboliths. No acute fracture or dislocation of the left hip. Minimal osteophyte formation with well preserved joint space. Soft tissues are unremarkable. IMPRESSION: No acute fracture or dislocation of the left hip. Electronically Signed   By: Yetta Glassman M.D.   On: 08/11/2021 15:09    Procedures .Critical Care  Date/Time: 08/11/2021 5:39 PM Performed by: Wyvonnia Dusky,  MD Authorized by: Wyvonnia Dusky, MD   Critical care provider statement:    Critical care time (minutes):  65   Critical care time was exclusive of:  Separately billable procedures and treating other patients   Critical care was necessary to treat or prevent imminent or life-threatening deterioration of the following conditions:  CNS failure or compromise   Critical care was time spent personally by me on the following activities:  Discussions with consultants, evaluation of patient's response to treatment, examination of patient, ordering and performing treatments and interventions, ordering and review of laboratory studies, ordering and review of radiographic studies, pulse oximetry, re-evaluation of patient's condition, obtaining history from patient or surrogate and review of old charts   Care discussed with: admitting provider   Comments:     Repeat ECG interpretation, IV potassium repletion, repeat neurological assessments   Medications Ordered in ED Medications  potassium chloride 10 mEq in 100 mL IVPB (0 mEq Intravenous Stopped 08/11/21 1505)  aspirin EC tablet 81 mg (has no administration in time range)  SUMAtriptan (IMITREX) tablet 50 mg (has no administration in time range)  amLODipine (NORVASC) tablet 10 mg (has no administration in time range)  metoprolol succinate (TOPROL-XL) 24 hr tablet 25 mg (has no administration in time range)  hydrOXYzine (ATARAX/VISTARIL) tablet 10-20 mg (has no administration in time range)  levothyroxine (SYNTHROID)  tablet 75 mcg (has no administration in time range)  pantoprazole (PROTONIX) EC tablet 40 mg (has no administration in time range)  enoxaparin (LOVENOX) injection 40 mg (has no administration in time range)  ondansetron (ZOFRAN) tablet 4 mg (has no administration in time range)    Or  ondansetron (ZOFRAN) injection 4 mg (has no administration in time range)  potassium chloride SA (KLOR-CON) CR tablet 40 mEq (has no administration in time  range)  acetaminophen (TYLENOL) tablet 650 mg (has no administration in time range)  atorvastatin (LIPITOR) tablet 10 mg (has no administration in time range)  DULoxetine (CYMBALTA) DR capsule 60 mg (has no administration in time range)  sodium chloride 0.9 % bolus 1,000 mL (0 mLs Intravenous Stopped 08/11/21 1223)  potassium chloride 10 mEq in 100 mL IVPB (10 mEq Intravenous New Bag/Given 08/11/21 1547)    ED Course  I have reviewed the triage vital signs and the nursing notes.  Pertinent labs & imaging results that were available during my care of the patient were reviewed by me and considered in my medical decision making (see chart for details).  This patient presents to the Emergency Department with complaint of altered mental status.  This involves an extensive number of treatment options, and is a complaint that carries with it a high risk of complications and morbidity.  The differential diagnosis includes hypoglycemia vs metabolic encephalopathy vs infection (including cystitis) vs ICH vs stroke vs polypharmacy vs other  Blood sugar is normal, less likely hypoglycemia. Heart rate and blood pressure normal on arrival.  This is lower my suspicion somewhat for metoprolol or beta-blocker overdose.  She does not have a prolonged PR interval.  Reviewed her EKG and it shows a sinus rhythm with a prolonged QTc, chronic left bundle branch block noted on prior tracings.  CVA is also on the differential, although she does not have any obvious localizing neurological symptoms and no sign of LVO.  She is outside the window for tPA.  We will pursue a CTH for AMS, but overall my clinical suspicion for stroke is low.  Otherwise her vital signs are normal on arrival, she does not appear to be in shock, does not appear febrile.  Supplemental history is provided by her partner Carloyn Manner and daughter Joslyn Devon (by phone).  I ordered, reviewed, and interpreted labs, including UDS + for benzos, UA without sign of  infection, Mg 2.0, 3.6, Covid negative, Trop , BMP, CBC otherwise unremarkable.  Etoh level negative. I ordered medication IV potassium, IV fluids I ordered imaging studies which included CTH, Xray chest I independently visualized and interpreted imaging which showed no acute brain lesion, some mild pulm edema and questionable bronchitis, and the monitor tracing which showed NSR, HR 80 bpm average I personally reviewed the patients ECG which showed sinus rhythm with no acute ischemic findings.  Normal WBC, no fever or cough to suspect sepsis or bacterial PNA at this time.  She is stable on room air with 97% O2 saturations.   Clinical Course as of 08/11/21 1742  Fri Aug 11, 2021  1052 Troponin I (High Sensitivity): 5 [MT]  1119 Repeat ECG stable, Qtc 520, PR interval normal, same LBBB pattern, no widening of QRS.  Nursing obtaining cath sample.  Patient very tearful now in the room but still cannot provide consistent or understandable history [MT]  1201 Benzodiazepines(!): POSITIVE [MT]  1230 On my reassessment the patient is now awake, extremely tearful.  She is able to tell me that "I  want to live anymore".  She repeatedly states that she did not take metoprolol or any other medications last night aside from the Xanax.  She continues to complain of left-sided chest pain.  Her troponin was negative initially, EKG is still at baseline, aside from the long QTC.  We have spoken to poison control, who made some recommendations regarding optimizing the patient's potassium over 4 and magnesium over 2.0.  IV potassium ordered.  We will need to repeat EKG after the infusion.  Prolonged QTC is more likely seen with both SSRI and Vistaril, less likely to be related to metoprolol.  We will hold IV glucagon or beta-blocker management unless the patient becomes hypotensive or bradycardic.  Will page for admission [MT]  1251 I updated patient's daughter regarding improved MS, plan for medical admission for  observation - cardiac screening and observation.  Consideration of psychiatric evaluation if patient is endorsing SI or concern for intentional overdose. [MT]    Clinical Course User Index [MT] Langston Masker Carola Rhine, MD    Final Clinical Impression(s) / ED Diagnoses Final diagnoses:  Drug overdose, undetermined intent, initial encounter  Chest pain, unspecified type  Benzodiazepine intoxication ([place])  Altered mental status, unspecified altered mental status type    Rx / DC Orders ED Discharge Orders     None        Wyvonnia Dusky, MD 08/11/21 1743

## 2021-08-11 NOTE — Progress Notes (Signed)
Patient threatened to sign AMA. Persuaded patient to stay, and ordered PRN Fiorecet and Melatonin.

## 2021-08-11 NOTE — ED Notes (Signed)
Pt got out of bed, took off all monitoring equipment, stated "I want to go home, let me leave. Take this thing out so I can go." Pt wants IV removed so she can leave, RN notified.

## 2021-08-11 NOTE — H&P (Signed)
History and Physical    Israh [name] [id number] DOB: 09-Apr-1951 DOA: 08/11/2021  PCP: Pleas Koch, NP (Confirm with patient/family/NH records and if not entered, this has to be entered at [facility] point of entry) Patient coming from: Home  I have personally briefly reviewed patient's old medical records in [place]  Chief Complaint: I feel depressed   HPI: Denise [name] is a 70 y.o. female with medical history significant of anxiety/depression, HTN, GERD, hypothyroidism, TIA, presented with Xanax overdose.  Patient was found by a friend in the morning at home confused and sleepy and the friend called 911.  Last night, same friend with the patient found the patient sleepy.  Patient admitted that she felt depressed and took 10 to 15 tablets of 0.25 mg Xanax over 10-12 hours yesterday. Upon being asked about what was her intention to overdose the Xanax, patient bursted to tears. She denied overdose her SSRI or HTN meds.  Patient also reported she might have fallen down last night on her left side and now she felt left hip pain and left sided chest pain. Chest pain is dull like, worsening with movement.  ED Course: Patient remained sleepy and confused.EKG showed borderline prolongation of QTC 515.  [place] contacted and recommended make K>4.5 and other supportive care. CT head showed chronic vascular disease. UDS positive for benzos.  Review of Systems: As per HPI otherwise 14 point review of systems negative.    Past Medical History:  Diagnosis Date   Allergy    seasonal   Arthritis    Bursitis of hip    Left   Constipation    90%of the time c/o constipation, occ has diarrhea - uses stool softener    Endometriosis    Facial flushing 05/12/2014   Fibroid    GERD (gastroesophageal reflux disease)    Hematochezia    Hiatal hernia    HSV-1 (herpes simplex virus 1) infection    Hyperlipidemia    Hyperplastic colon polyp    Hypertension     Hypothyroidism    Ischemic colitis ([place]) 01/2019   LBBB (left bundle branch block)    Low back pain    Migraine    Osteopenia 2008   -1.2 FEMORAL NECK   Prediabetes    Stroke [facility])    TIA   TIA (transient ischemic attack) 08-10-2013    Past Surgical History:  Procedure Laterality Date   ABDOMINAL HYSTERECTOMY  1989   TAH, BSO   CHOLECYSTECTOMY  2003   COLONOSCOPY     KNEE SURGERY  2004   right   NASAL SEPTUM SURGERY  2012   PITUITARY SURGERY  2001   POLYPECTOMY     SHOULDER SURGERY  2007   right   THYROID LOBECTOMY  2002     reports that she has never smoked. She has never used smokeless tobacco. She reports current alcohol use. She reports that she does not use drugs.  Allergies  Allergen Reactions   Tramadol Other (See Comments)    Reaction: NIGHTMARES    Family History  Problem Relation Age of Onset   Heart disease Father    Cancer Mother        brain tumor   Breast cancer Maternal Aunt    Parkinson's disease Sister    Colon cancer Neg Hx    Colon polyps Neg Hx    Rectal cancer Neg Hx    Stomach cancer Neg Hx    Thyroid disease Neg  Hx    Esophageal cancer Neg Hx      Prior to Admission medications   Medication Sig Start Date End Date Taking? Authorizing Provider  amLODipine (NORVASC) 10 MG tablet TAKE 1 TABLET BY MOUTH  DAILY FOR BLOOD PRESSURE 07/23/21   Pleas Koch, NP  aspirin 81 MG tablet Take 81 mg by mouth daily.    [provider]  atorvastatin (LIPITOR) 10 MG tablet TAKE 1 TABLET BY MOUTH  DAILY FOR CHOLESTEROL 06/08/21   Pleas Koch, NP  DULoxetine (CYMBALTA) 60 MG capsule TAKE 1 CAPSULE BY MOUTH  DAILY FOR HEADACHE  PREVENTION 12/23/20   Pleas Koch, NP  esomeprazole (NEXIUM) 20 MG capsule Take 1 capsule (20 mg total) by mouth every morning. 01/21/20   Esterwood, Amy S, PA-C  hydrOXYzine (ATARAX/VISTARIL) 10 MG tablet Take 1-2 tablets (10-20 mg total) by mouth at bedtime as needed for anxiety. 06/01/21   Pleas Koch,  NP  levothyroxine (SYNTHROID) 75 MCG tablet Take 1 tablet by mouth every morning on an empty stomach with water only.  No food or other medications for 30 minutes. 06/08/21   Pleas Koch, NP  metoprolol succinate (TOPROL-XL) 25 MG 24 hr tablet TAKE 1 TABLET BY MOUTH  DAILY FOR BLOOD PRESSURE 06/16/21   Pleas Koch, NP  rizatriptan (MAXALT) 5 MG tablet Take 1 tablet by mouth at migraine onset. May repeat with 1 tablet in 2 hours if needed. 06/23/21   Pleas Koch, NP    Physical Exam: Vitals:   08/11/21 1200 08/11/21 1230 08/11/21 1300 08/11/21 1400  BP: 136/88 136/89 133/77 (!) 136/98  Pulse: 82 80 81 (!) 126  Resp: '18 13 16 16  '$ Temp:      TempSrc:      SpO2: 100% 96% 94% 95%    Constitutional: NAD, calm, comfortable Vitals:   08/11/21 1200 08/11/21 1230 08/11/21 1300 08/11/21 1400  BP: 136/88 136/89 133/77 (!) 136/98  Pulse: 82 80 81 (!) 126  Resp: '18 13 16 16  '$ Temp:      TempSrc:      SpO2: 100% 96% 94% 95%   Eyes: PERRL, lids and conjunctivae normal ENMT: Mucous membranes are moist. Posterior pharynx clear of any exudate or lesions.Normal dentition.  Neck: normal, supple, no masses, no thyromegaly Respiratory: clear to auscultation bilaterally, no wheezing, no crackles. Normal respiratory effort. No accessory muscle use. Left rib cage tenderness Cardiovascular: Regular rate and rhythm, no murmurs / rubs / gallops. No extremity edema. 2+ pedal pulses. No carotid bruits.  Abdomen: no tenderness, no masses palpated. No hepatosplenomegaly. Bowel sounds positive.  Musculoskeletal: no clubbing / cyanosis. No joint deformity upper and lower extremities. Left hip tender with decreased ROM.  Skin: no rashes, lesions, ulcers. No induration Neurologic: CN 2-12 grossly intact. Sensation intact, DTR normal. Strength 5/5 in all 4.  Psychiatric: Normal judgment and insight. Alert and oriented x 3. In tearing    Labs on Admission: I have personally reviewed following labs  and imaging studies  CBC: Recent Labs  Lab 08/11/21 0922  WBC 7.3  NEUTROABS 4.1  HGB 13.4  HCT 42.2  MCV 83.2  PLT [id number]   Basic Metabolic Panel: Recent Labs  Lab 08/11/21 0922 08/11/21 1059  NA 139  --   K 3.6  --   CL 106  --   CO2 23  --   GLUCOSE 120*  --   BUN 10  --   CREATININE 0.73  --  CALCIUM 9.2  --   MG  --  2.0   GFR: Estimated Creatinine Clearance: 73.9 mL/min (by C-G formula based on SCr of 0.73 mg/dL). Liver Function Tests: Recent Labs  Lab 08/11/21 0922  AST 18  ALT 15  ALKPHOS 156*  BILITOT 0.5  PROT 6.4*  ALBUMIN 3.4*   No results for input(s): LIPASE, AMYLASE in the last 168 hours. No results for input(s): AMMONIA in the last 168 hours. Coagulation Profile: No results for input(s): INR, PROTIME in the last 168 hours. Cardiac Enzymes: No results for input(s): CKTOTAL, CKMB, CKMBINDEX, TROPONINI in the last 168 hours. BNP (last 3 results) No results for input(s): PROBNP in the last 8760 hours. HbA1C: No results for input(s): HGBA1C in the last 72 hours. CBG: Recent Labs  Lab 08/11/21 0912  GLUCAP 116*   Lipid Profile: No results for input(s): CHOL, HDL, LDLCALC, TRIG, CHOLHDL, LDLDIRECT in the last 72 hours. Thyroid Function Tests: Recent Labs    08/11/21 0922 08/11/21 0926  TSH  --  2.807  FREET4 0.89  --    Anemia Panel: No results for input(s): VITAMINB12, FOLATE, FERRITIN, TIBC, IRON, RETICCTPCT in the last 72 hours. Urine analysis:    Component Value Date/Time   COLORURINE YELLOW 08/11/2021 1115   APPEARANCEUR CLEAR 08/11/2021 1115   LABSPEC 1.006 08/11/2021 1115   PHURINE 6.0 08/11/2021 1115   GLUCOSEU NEGATIVE 08/11/2021 1115   HGBUR NEGATIVE 08/11/2021 1115   BILIRUBINUR NEGATIVE 08/11/2021 1115   BILIRUBINUR 1+ 11/02/2020 [place] 08/11/2021 1115   PROTEINUR NEGATIVE 08/11/2021 1115   UROBILINOGEN 0.2 11/02/2020 1149   UROBILINOGEN 0.2 07/17/2013 1644   NITRITE NEGATIVE 08/11/2021 1115    LEUKOCYTESUR NEGATIVE 08/11/2021 1115    Radiological Exams on Admission: CT HEAD WO CONTRAST (5MM)  Result Date: 08/11/2021 CLINICAL DATA:  70 year old female with history of altered mental status. EXAM: CT HEAD WITHOUT CONTRAST TECHNIQUE: Contiguous axial images were obtained from the base of the skull through the vertex without intravenous contrast. COMPARISON:  Brain MRI 03/15/2020. FINDINGS: Brain: Patchy areas of decreased attenuation are noted throughout the deep and periventricular white matter of the cerebral hemispheres bilaterally, compatible with mild chronic microvascular ischemic disease. No evidence of acute infarction, hemorrhage, hydrocephalus, extra-axial collection or mass lesion/mass effect. Vascular: No hyperdense vessel or unexpected calcification. Skull: Normal. Negative for fracture or focal lesion. Sinuses/Orbits: No acute finding. Small amount of mucosal thickening in the maxillary sinuses bilaterally. In the anterior aspect of the right maxillary sinus there is a 1.6 cm mucosal retention cyst or polyp. Other: None. IMPRESSION: 1. No acute intracranial abnormalities. 2. Mild chronic microvascular ischemic changes in the cerebral white matter again noted. Electronically Signed   By: Vinnie Langton M.D.   On: 08/11/2021 10:04   DG Chest Portable 1 View  Result Date: 08/11/2021 CLINICAL DATA:  70 year old female with history of altered mental status. Drug overdose. EXAM: PORTABLE CHEST 1 VIEW COMPARISON:  Chest x-ray 04/19/2011. FINDINGS: Lung volumes are slightly low. Ill-defined opacities at the left base. Widespread interstitial prominence and diffuse peribronchial cuffing. Possible trace left pleural effusion. No right pleural effusion. No pneumothorax. No evidence of pulmonary edema. Heart size is borderline enlarged. Upper mediastinal contours are within normal limits. IMPRESSION: 1. Diffuse peribronchial cuffing and interstitial prominence may suggest an acute bronchitis.  Ill-defined opacities at the left lung base may reflect atelectasis and/or developing airspace consolidation. 2. Possible trace left pleural effusion. Electronically Signed   By: Mauri Brooklyn.D.  On: 08/11/2021 09:37    EKG: Independently reviewed. Sinus, chronic LBBB  Assessment/Plan Active Problems:   Overdose of benzodiazepine  (please populate well all problems here in Problem List. (For example, if patient is on BP meds at home and you resume or decide to hold them, it is a problem that needs to be her. Same for CAD, COPD, HLD and so on)  Acute metabolic encephalopathy -Secondary to benzodiazepine overdose, improved. -K=3.6, as per poison control center's recommendation, patient is getting PO and IV replacement. -Probably suicidal intention, will get psy consult.  Left hip pain -Check xray to rule out fracture or dislocation -PRN Tylenol  Prolonged QTC -As recommended by poison control center, will hold off today's beta blocker and SSRI. -Check EKG tonight and tomorrow AM. Patient does have a chronic LBBB.  DVT prophylaxis: Lovenox Code Status: Full Code Family Communication: Daughter over phone Disposition Plan: Expect 1-2 days hospital stay and may need inpatient psy Consults called: Psy Admission status: PCU   Lequita Halt MD [facility] Pager [phone]  08/11/2021, 2:10 PM

## 2021-08-11 NOTE — ED Notes (Signed)
Paged [facility] for RN

## 2021-08-11 NOTE — ED Notes (Signed)
Pt refusing VS states "I am fine go tend to your sick patients, I am fine."

## 2021-08-11 NOTE — ED Notes (Signed)
Patient trying to exit bed. Requesting to leave AMA. Physician notified.

## 2021-08-11 NOTE — ED Notes (Signed)
Pt agitated, very tearful, trying to get out of bed, and not complying to safety for themself.

## 2021-08-11 NOTE — ED Triage Notes (Signed)
Patient BIB [facility] from home, complaint of alprazolam overdose. Report that patient took 10 0.5 mg pills. Unknown what else patient may have taken. Hx of depression.

## 2021-08-11 NOTE — ED Notes (Signed)
Attempted report. Told I would be called back.

## 2021-08-11 NOTE — ED Notes (Signed)
Spoke with Field seismologist from [employer]. Recommends increasing K+ to at least 4.5 and obtain Mag value and repeat EKG upon completion of medications. Observation min 6 hours post arrival.

## 2021-08-12 DIAGNOSIS — I10 Essential (primary) hypertension: Secondary | ICD-10-CM

## 2021-08-12 DIAGNOSIS — T424X4A Poisoning by benzodiazepines, undetermined, initial encounter: Secondary | ICD-10-CM

## 2021-08-12 DIAGNOSIS — K219 Gastro-esophageal reflux disease without esophagitis: Secondary | ICD-10-CM

## 2021-08-12 DIAGNOSIS — E785 Hyperlipidemia, unspecified: Secondary | ICD-10-CM

## 2021-08-12 DIAGNOSIS — E890 Postprocedural hypothyroidism: Secondary | ICD-10-CM

## 2021-08-12 DIAGNOSIS — F419 Anxiety disorder, unspecified: Secondary | ICD-10-CM

## 2021-08-12 DIAGNOSIS — D352 Benign neoplasm of pituitary gland: Secondary | ICD-10-CM

## 2021-08-12 DIAGNOSIS — F32A Depression, unspecified: Secondary | ICD-10-CM

## 2021-08-12 LAB — COMPREHENSIVE METABOLIC PANEL
ALT: 14 U/L (ref 0–44)
AST: 16 U/L (ref 15–41)
Albumin: 3.2 g/dL — ABNORMAL LOW (ref 3.5–5.0)
Alkaline Phosphatase: 162 U/L — ABNORMAL HIGH (ref 38–126)
Anion gap: 7 (ref 5–15)
BUN: 6 mg/dL — ABNORMAL LOW (ref 8–23)
CO2: 24 mmol/L (ref 22–32)
Calcium: 9.3 mg/dL (ref 8.9–10.3)
Chloride: 107 mmol/L (ref 98–111)
Creatinine, Ser: 0.74 mg/dL (ref 0.44–1.00)
GFR, Estimated: >60 mL/min (ref >60)
Glucose, Bld: 124 mg/dL — ABNORMAL HIGH (ref 70–99)
Potassium: 4.2 mmol/L (ref 3.5–5.1)
Sodium: 138 mmol/L (ref 135–145)
Total Bilirubin: 0.7 mg/dL (ref 0.3–1.2)
Total Protein: 6.4 g/dL — ABNORMAL LOW (ref 6.5–8.1)

## 2021-08-12 LAB — HIV ANTIBODY (ROUTINE TESTING W REFLEX): HIV Screen 4th Generation wRfx: NONREACTIVE

## 2021-08-12 MED ORDER — POLYETHYLENE GLYCOL 3350 17 G PO PACK
17.0000 g | PACK | Freq: Every day | ORAL | Status: DC
  Administered 2021-08-12 – 2021-08-15 (×4): 17 g via ORAL
  Filled 2021-08-12 (×4): qty 1

## 2021-08-12 NOTE — Consult Note (Addendum)
[facility] Face-to-Face Psychiatry Consult   Reason for Consult: Intentional overdose Referring Physician: Internal medicine Patient Identification: Denise [name] MRN:  [id number] Principal Diagnosis: Overdose of benzodiazepine Diagnosis:  Principal Problem:   Overdose of benzodiazepine Active Problems:   Hypothyroidism   GERD (gastroesophageal reflux disease)   Pituitary adenoma ([place])   Essential hypertension   Hyperlipidemia   Anxiety and depression   Benzodiazepine intoxication ([place])   Total Time spent with patient: 15 minutes  Subjective:   Denise [name] is a 70 y.o. female was admitted and placed under Involuntary commitment due (IVC) due to intentional overdose on Xanax medication.  She reports the medication belongs to her sister. [name] reports a verbal altercation between she and her daughter. Stated "  Things just got out of hand."  Reports she has 2 daughters and has a pretty good relationship with her daughter who resides in [place].   [name] stated she became overwhelmed and stressed related to the disagreement between she and her daughter who resides in [place] but travels back and forth to [place].  Patient did not elaborate and appears to be minimizing symptoms.  She continues to be tearful throughout this assessment.  Stated " I am just ready to go home."  [name] reports she is currently followed by her primary care provider who prescribes Cymbalta 60 mg for depression and migraines. Patient denied previous inpatient admissions.  Denied history of self injures behaviors and/or suicide attempts.  Denied that she is followed by therapy and/or psychiatry currently.  Denied illicit drug use or substance abuse history.  [name] presents tearful and remorseful regarding her actions.  She states she is feels better today and is ready to go home.  States she is hopeful to reside with her other daughter in [place]. GA. discussed inpatient admission.  Patient was reluctant to  plan.  NP will attempt to follow-up with daughter for additional collateral.  Support, encouragement and reassurance was provided.    Past Psychiatric History:   Risk to Self:   Risk to Others:   Prior Inpatient Therapy:   Prior Outpatient Therapy:    Past Medical History:  Past Medical History:  Diagnosis Date   Allergy    seasonal   Arthritis    Bursitis of hip    Left   Constipation    90%of the time c/o constipation, occ has diarrhea - uses stool softener    Endometriosis    Facial flushing 05/12/2014   Fibroid    GERD (gastroesophageal reflux disease)    Hematochezia    Hiatal hernia    HSV-1 (herpes simplex virus 1) infection    Hyperlipidemia    Hyperplastic colon polyp    Hypertension    Hypothyroidism    Ischemic colitis ([place]) 01/2019   LBBB (left bundle branch block)    Low back pain    Migraine    Osteopenia 2008   -1.2 FEMORAL NECK   Prediabetes    Stroke [facility])    TIA   TIA (transient ischemic attack) 08-10-2013    Past Surgical History:  Procedure Laterality Date   ABDOMINAL HYSTERECTOMY  1989   TAH, BSO   CHOLECYSTECTOMY  2003   COLONOSCOPY     KNEE SURGERY  2004   right   NASAL SEPTUM SURGERY  2012   PITUITARY SURGERY  2001   POLYPECTOMY     SHOULDER SURGERY  2007   right   THYROID LOBECTOMY  2002   Family History:  Family History  Problem Relation Age of Onset   Heart disease Father    Cancer Mother        brain tumor   Breast cancer Maternal Aunt    Parkinson's disease Sister    Colon cancer Neg Hx    Colon polyps Neg Hx    Rectal cancer Neg Hx    Stomach cancer Neg Hx    Thyroid disease Neg Hx    Esophageal cancer Neg Hx    Family Psychiatric  History:  Social History:  Social History   Substance and Sexual Activity  Alcohol Use Yes   Alcohol/week: 0.0 standard drinks   Comment: 1 every 3 months     Social History   Substance and Sexual Activity  Drug Use No    Social History   Socioeconomic History   Marital  status: Divorced    Spouse name: Not on file   Number of children: 2   Years of education: Not on file   Highest education level: Not on file  Occupational History    Employer: RETIRED  Tobacco Use   Smoking status: Never   Smokeless tobacco: Never  Vaping Use   Vaping Use: Never used  Substance and Sexual Activity   Alcohol use: Yes    Alcohol/week: 0.0 standard drinks    Comment: 1 every 3 months   Drug use: No   Sexual activity: Not Currently    Comment: intercourse age 54, more than 5 sexual partners,des neg  Other Topics Concern   Not on file  Social History Narrative   Single.   2 daughters, 4 grandchildren.   Once worked for [employer].   Enjoys Ambulance person.    Social Determinants of Health   Financial Resource Strain: Not on file  Food Insecurity: Not on file  Transportation Needs: Not on file  Physical Activity: Not on file  Stress: Not on file  Social Connections: Not on file   Additional Social History:    Allergies:   Allergies  Allergen Reactions   Tramadol Other (See Comments)    Reaction: NIGHTMARES    Labs:  Results for orders placed or performed during the hospital encounter of 08/11/21 (from the past 48 hour(s))  CBG monitoring, ED     Status: Abnormal   Collection Time: 08/11/21  9:12 AM  Result Value Ref Range   Glucose-Capillary 116 (H) 70 - 99 mg/dL    Comment: Glucose reference range applies only to samples taken after fasting for at least 8 hours.  Comprehensive metabolic panel     Status: Abnormal   Collection Time: 08/11/21  9:22 AM  Result Value Ref Range   Sodium 139 135 - 145 mmol/L   Potassium 3.6 3.5 - 5.1 mmol/L   Chloride 106 98 - 111 mmol/L   CO2 23 22 - 32 mmol/L   Glucose, Bld 120 (H) 70 - 99 mg/dL    Comment: Glucose reference range applies only to samples taken after fasting for at least 8 hours.   BUN 10 8 - 23 mg/dL   Creatinine, Ser 0.73 0.44 - 1.00 mg/dL   Calcium 9.2 8.9 - 10.3  mg/dL   Total Protein 6.4 (L) 6.5 - 8.1 g/dL   Albumin 3.4 (L) 3.5 - 5.0 g/dL   AST 18 15 - 41 U/L   ALT 15 0 - 44 U/L   Alkaline Phosphatase 156 (H) 38 - 126 U/L   Total Bilirubin 0.5 0.3 - 1.2 mg/dL  GFR, Estimated >60 >60 mL/min    Comment: (NOTE) Calculated using the CKD-EPI Creatinine Equation (2021)    Anion gap 10 5 - 15    Comment: Performed at [facility], [place] [address], [place], [place] [address]  Ethanol     Status: None   Collection Time: 08/11/21  9:22 AM  Result Value Ref Range   Alcohol, Ethyl (B) <10 <10 mg/dL    Comment: (NOTE) Lowest detectable limit for serum alcohol is 10 mg/dL.  For medical purposes only. Performed at [facility], [place] [address], [place], [place] [address]   Acetaminophen level     Status: Abnormal   Collection Time: 08/11/21  9:22 AM  Result Value Ref Range   Acetaminophen (Tylenol), Serum <10 (L) 10 - 30 ug/mL    Comment: (NOTE) Therapeutic concentrations vary significantly. A range of 10-30 ug/mL  may be an effective concentration for many patients. However, some  are best treated at concentrations outside of this range. Acetaminophen concentrations >150 ug/mL at 4 hours after ingestion  and >50 ug/mL at 12 hours after ingestion are often associated with  toxic reactions.  Performed at [facility], [place] [address] [place], [place] [id number]   Salicylate level     Status: Abnormal   Collection Time: 08/11/21  9:22 AM  Result Value Ref Range   Salicylate Lvl [id number] (L) 7.0 - 30.0 mg/dL    Comment: Performed at [facility] [address], [place], [place] [address]  CBC with Differential     Status: None   Collection Time: 08/11/21  9:22 AM  Result Value Ref Range   WBC 7.3 4.0 - 10.5 K/uL   RBC 5.07 3.87 - 5.11 MIL/uL   Hemoglobin 13.4 12.0 - 15.0 g/dL   HCT 42.2 36.0 - 46.0 %   MCV 83.2 80.0 - 100.0 fL   MCH 26.4 26.0 - 34.0 pg   MCHC 31.8 30.0 - 36.0 g/dL   RDW 14.0 11.5 - 15.5 %   Platelets  294 150 - 400 K/uL   nRBC 0.0 0.0 - 0.2 %   Neutrophils Relative % 57 %   Neutro Abs 4.1 1.7 - 7.7 K/uL   Lymphocytes Relative 29 %   Lymphs Abs 2.1 0.7 - 4.0 K/uL   Monocytes Relative 6 %   Monocytes Absolute 0.5 0.1 - 1.0 K/uL   Eosinophils Relative 7 %   Eosinophils Absolute 0.5 0.0 - 0.5 K/uL   Basophils Relative 1 %   Basophils Absolute 0.0 0.0 - 0.1 K/uL   Immature Granulocytes 0 %   Abs Immature Granulocytes 0.02 0.00 - 0.07 K/uL    Comment: Performed at [facility], [address], [place], [place] [address]  Troponin I (High Sensitivity)     Status: None   Collection Time: 08/11/21  9:22 AM  Result Value Ref Range   Troponin I (High Sensitivity) 5 <18 ng/L    Comment: (NOTE) Elevated high sensitivity troponin I (hsTnI) values and significant  changes across serial measurements may suggest ACS but many other  chronic and acute conditions are known to elevate hsTnI results.  Refer to the "Links" section for chest pain algorithms and additional  guidance. Performed at [facility], [place] [address] [place], [place] [address]   T4, free     Status: None   Collection Time: 08/11/21  9:22 AM  Result Value Ref Range   Free T4 0.89 0.61 -  1.12 ng/dL    Comment: (NOTE) Biotin ingestion may interfere with free T4 tests. If the results are inconsistent with the TSH level, previous test results, or the clinical presentation, then consider biotin interference. If needed, order repeat testing after stopping biotin. Performed at [facility], [place] [address] [place], [place] [address]   TSH     Status: None   Collection Time: 08/11/21  9:26 AM  Result Value Ref Range   TSH 2.807 0.350 - 4.500 uIU/mL    Comment: Performed by a 3rd Generation assay with a functional sensitivity of <=0.01 uIU/mL. Performed at [facility], [place] [address], [place], [place] [address]   Troponin I (High Sensitivity)     Status: None   Collection Time: 08/11/21 10:56 AM   Result Value Ref Range   Troponin I (High Sensitivity) 6 <18 ng/L    Comment: (NOTE) Elevated high sensitivity troponin I (hsTnI) values and significant  changes across serial measurements may suggest ACS but many other  chronic and acute conditions are known to elevate hsTnI results.  Refer to the "Links" section for chest pain algorithms and additional  guidance. Performed at [facility], [place] [address] [place], [place] [address]   Resp Panel by RT-PCR (Flu A&B, Covid) Nasopharyngeal Swab     Status: None   Collection Time: 08/11/21 10:59 AM   Specimen: Nasopharyngeal Swab; Nasopharyngeal(NP) swabs in vial transport medium  Result Value Ref Range   SARS Coronavirus 2 by RT PCR NEGATIVE NEGATIVE    Comment: (NOTE) SARS-CoV-2 target nucleic acids are NOT DETECTED.  The SARS-CoV-2 RNA is generally detectable in upper respiratory specimens during the acute phase of infection. The lowest concentration of SARS-CoV-2 viral copies this assay can detect is 138 copies/mL. A negative result does not preclude SARS-Cov-2 infection and should not be used as the sole basis for treatment or other patient management decisions. A negative result may occur with  improper specimen collection/handling, submission of specimen other than nasopharyngeal swab, presence of viral mutation(s) within the areas targeted by this assay, and inadequate number of viral copies(<138 copies/mL). A negative result must be combined with clinical observations, patient history, and epidemiological information. The expected result is Negative.  Fact Sheet for Patients:  EntrepreneurPulse.com.au  Fact Sheet for Healthcare Providers:  IncredibleEmployment.be  This test is no t yet approved or cleared by the [place] FDA and  has been authorized for detection and/or diagnosis of SARS-CoV-2 by FDA under an Emergency Use Authorization (EUA). This EUA will remain  in  effect (meaning this test can be used) for the duration of the COVID-19 declaration under Section 564(b)(1) of the Act, 21 U.S.C.section 360bbb-3(b)(1), unless the authorization is terminated  or revoked sooner.       Influenza A by PCR NEGATIVE NEGATIVE   Influenza B by PCR NEGATIVE NEGATIVE    Comment: (NOTE) The Xpert Xpress SARS-CoV-2/FLU/RSV plus assay is intended as an aid in the diagnosis of influenza from Nasopharyngeal swab specimens and should not be used as a sole basis for treatment. Nasal washings and aspirates are unacceptable for Xpert Xpress SARS-CoV-2/FLU/RSV testing.  Fact Sheet for Patients: EntrepreneurPulse.com.au  Fact Sheet for Healthcare Providers: IncredibleEmployment.be  This test is not yet approved or cleared by the [place] FDA and has been authorized for detection and/or diagnosis of SARS-CoV-2 by FDA under an Emergency Use Authorization (EUA). This EUA will remain in effect (meaning this test can be used) for the duration of  the COVID-19 declaration under Section 564(b)(1) of the Act, 21 U.S.C. section 360bbb-3(b)(1), unless the authorization is terminated or revoked.  Performed at [facility], [place] [address] [place], [place] [address]   Magnesium     Status: None   Collection Time: 08/11/21 10:59 AM  Result Value Ref Range   Magnesium 2.0 1.7 - 2.4 mg/dL    Comment: Performed at [facility] [address] [place], [place] [address]  Rapid urine drug screen (hospital performed)     Status: Abnormal   Collection Time: 08/11/21 11:15 AM  Result Value Ref Range   Opiates NONE DETECTED NONE DETECTED   Cocaine NONE DETECTED NONE DETECTED   Benzodiazepines POSITIVE (A) NONE DETECTED   Amphetamines NONE DETECTED NONE DETECTED   Tetrahydrocannabinol NONE DETECTED NONE DETECTED   Barbiturates NONE DETECTED NONE DETECTED    Comment: (NOTE) DRUG SCREEN FOR MEDICAL PURPOSES ONLY.  IF  CONFIRMATION IS NEEDED FOR ANY PURPOSE, NOTIFY LAB WITHIN 5 DAYS.  LOWEST DETECTABLE LIMITS FOR URINE DRUG SCREEN Drug Class                     Cutoff (ng/mL) Amphetamine and metabolites    1000 Barbiturate and metabolites    200 Benzodiazepine                 [id number] Tricyclics and metabolites     300 Opiates and metabolites        300 Cocaine and metabolites        300 THC                            50 Performed at [facility], [place] [address], [place], [place] [address]   Urinalysis, Routine w reflex microscopic Urine, Catheterized     Status: None   Collection Time: 08/11/21 11:15 AM  Result Value Ref Range   Color, Urine YELLOW YELLOW   APPearance CLEAR CLEAR   Specific Gravity, Urine 1.006 1.005 - 1.030   pH 6.0 5.0 - 8.0   Glucose, UA NEGATIVE NEGATIVE mg/dL   Hgb urine dipstick NEGATIVE NEGATIVE   Bilirubin Urine NEGATIVE NEGATIVE   Ketones, ur NEGATIVE NEGATIVE mg/dL   Protein, ur NEGATIVE NEGATIVE mg/dL   Nitrite NEGATIVE NEGATIVE   Leukocytes,Ua NEGATIVE NEGATIVE    Comment: Performed at [facility] [address] [place], [place] [address]  HIV Antibody (routine testing w rflx)     Status: None   Collection Time: 08/12/21  2:16 AM  Result Value Ref Range   HIV Screen 4th Generation wRfx Non Reactive Non Reactive    Comment: Performed at [facility], [place] [address], [place], [place] [address]  Comprehensive metabolic panel     Status: Abnormal   Collection Time: 08/12/21  2:16 AM  Result Value Ref Range   Sodium 138 135 - 145 mmol/L   Potassium 4.2 3.5 - 5.1 mmol/L   Chloride 107 98 - 111 mmol/L   CO2 24 22 - 32 mmol/L   Glucose, Bld 124 (H) 70 - 99 mg/dL    Comment: Glucose reference range applies only to samples taken after fasting for at least 8 hours.   BUN 6 (L) 8 - 23 mg/dL   Creatinine, Ser 0.74 0.44 - 1.00 mg/dL   Calcium 9.3 8.9 - 10.3 mg/dL   Total Protein 6.4 (L) 6.5 - 8.1 g/dL   Albumin  3.2 (L) 3.5 - 5.0 g/dL   AST 16 15 -  41 U/L   ALT 14 0 - 44 U/L   Alkaline Phosphatase 162 (H) 38 - 126 U/L   Total Bilirubin 0.7 0.3 - 1.2 mg/dL   GFR, Estimated >60 >60 mL/min    Comment: (NOTE) Calculated using the CKD-EPI Creatinine Equation (2021)    Anion gap 7 5 - 15    Comment: Performed at [facility] [address] [place], [place] [address]    Current Facility-Administered Medications  Medication Dose Route Frequency Provider Last Rate Last Admin   amLODipine (NORVASC) tablet 10 mg  10 mg Oral Daily Wynetta Fines T, MD   10 mg at 08/12/21 [id number]   aspirin EC tablet 81 mg  81 mg Oral Daily Wynetta Fines T, MD   81 mg at 08/12/21 [id number]   atorvastatin (LIPITOR) tablet 10 mg  10 mg Oral Daily Lequita Halt, MD       butalbital-acetaminophen-caffeine (FIORICET) [phone] MG per tablet 1 tablet  1 tablet Oral Q6H PRN Lequita Halt, MD   1 tablet at 08/12/21 0514   DULoxetine (CYMBALTA) DR capsule 60 mg  60 mg Oral Daily Wynetta Fines T, MD       enoxaparin (LOVENOX) injection 40 mg  40 mg Subcutaneous Q24H Wynetta Fines T, MD   40 mg at 08/12/21 1623   levothyroxine (SYNTHROID) tablet 75 mcg  75 mcg Oral Q0600 Wynetta Fines T, MD   75 mcg at 08/12/21 [id number]   melatonin tablet 5 mg  5 mg Oral QHS Wynetta Fines T, MD   5 mg at 08/11/21 2003   metoprolol succinate (TOPROL-XL) 24 hr tablet 25 mg  25 mg Oral Daily Wynetta Fines T, MD   25 mg at 08/12/21 0939   ondansetron (ZOFRAN) tablet 4 mg  4 mg Oral Q6H PRN Lequita Halt, MD       Or   ondansetron [facility]) injection 4 mg  4 mg Intravenous Q6H PRN Wynetta Fines T, MD       pantoprazole (PROTONIX) EC tablet 40 mg  40 mg Oral Daily Wynetta Fines T, MD   40 mg at 08/12/21 [id number]   polyethylene glycol (MIRALAX / GLYCOLAX) packet 17 g  17 g Oral Daily Tawni Millers, MD   17 g at 08/12/21 1351   SUMAtriptan (IMITREX) tablet 50 mg  50 mg Oral Q2H PRN Lequita Halt, MD        Musculoskeletal: Strength & Muscle Tone: within normal limits Gait & Station: normal Patient leans:  N/A            Psychiatric Specialty Exam:  Presentation  General Appearance:  No data recorded Eye Contact: No data recorded Speech: No data recorded Speech Volume: No data recorded Handedness: No data recorded  Mood and Affect  Mood: No data recorded Affect: No data recorded  Thought Process  Thought Processes: No data recorded Descriptions of Associations:No data recorded Orientation:No data recorded Thought Content:No data recorded History of Schizophrenia/Schizoaffective disorder:No data recorded Duration of Psychotic Symptoms:No data recorded Hallucinations:No data recorded Ideas of Reference:No data recorded Suicidal Thoughts:No data recorded Homicidal Thoughts:No data recorded  Sensorium  Memory: No data recorded Judgment: No data recorded Insight: No data recorded  Executive Functions  Concentration: No data recorded Attention Span: No data recorded Recall: No data recorded Fund of Knowledge: No data recorded Language: No data recorded  Psychomotor Activity  Psychomotor Activity: No data recorded  Assets  Assets: No data recorded  Sleep  Sleep: No data recorded  Physical Exam: Physical Exam Vitals and nursing note reviewed.  Eyes:     Pupils: Pupils are equal, round, and reactive to light.  Cardiovascular:     Rate and Rhythm: Normal rate.  Pulmonary:     Effort: Pulmonary effort is normal.  Neurological:     Mental Status: She is oriented to person, place, and time.  Psychiatric:        Attention and Perception: Attention normal.        Mood and Affect: Mood normal.        Speech: Speech normal.        Behavior: Behavior normal.        Thought Content: Thought content normal.        Cognition and Memory: Cognition normal.   ROS Blood pressure 120/89, pulse 90, temperature 97.8 F (36.6 C), temperature source Oral, resp. rate 18, SpO2 98 %. There is no height or weight on file to calculate BMI.  Treatment  Plan Summary: Daily contact with patient to assess and evaluate symptoms and progress in treatment and Medication management  -Continue Cymbalta 60 mg p.o. daily -CSW to seek inpatient admission Geropsychiatry-TOC consult placed - Continue 1:1 safety sitter   Disposition: Recommend psychiatric Inpatient admission when medically cleared.  [place]ill [place], NP 08/12/2021 4:52 PM

## 2021-08-12 NOTE — ED Notes (Signed)
IVC paperwork completed, patient served by Garment/textile technologist.

## 2021-08-12 NOTE — Progress Notes (Addendum)
PROGRESS NOTE    Denise [name]  [id number] DOB: [birth year] DOA: 08/11/2021 PCP: Pleas Koch, NP    Brief Narrative:  Denise [name] was admitted to the hospital with the working diagnosis of acute toxic encephalopathy due to benzodiazepine overdose.   70 year old female past medical history for hypertension, GERD, hypothyroidism, TIA anxiety and depression who presented feeling depressed.  Patient was found by her friend confused and somnolent, 911 was called and patient was brought to the hospital. The night prior she felt depressed and took 10 to 15 tablets of 0.25 mg of alprazolam over the course of 10 to 12 hours.  On her initial physical examination blood pressure 136/88, heart rate 82, respirate 18, oxygen saturation 96% her lungs are clear to auscultation bilaterally, heart S1-S2, present, rhythmic, soft abdomen, no lower extremity edema, she had left hip pain and left rib cage tenderness to palpation.  Sodium 139, potassium 3.6, chloride 106, bicarb 23, glucose 120, BUN 10, creatinine 0.73, AST 18, ALT 15, white count 7.3, hemoglobin 13.4, hematocrit 42.2, platelets 294. SARS COVID-19 negative.  Urinalysis specific gravity 1.006, negative nitrates. Toxicology less than 10 alcohol, less than 10 salicylate, positive for benzodiazepine.  Head CT no acute changes.  Chest radiograph with increased interstitial markings at bases, no frank infiltrates.  EKG 83 bpm, left axis deviation, left bundle branch block, QTC 509, sinus rhythm, Q-wave V1-V2, poor R wave progression, no significant ST segment or T wave changes.  Assessment & Plan:   Principal Problem:   Overdose of benzodiazepine Active Problems:   Hypothyroidism   GERD (gastroesophageal reflux disease)   Pituitary adenoma ([place])   Essential hypertension   Hyperlipidemia   Anxiety and depression   Benzodiazepine intoxication ([place])   Acute toxic encephalopathy due to alprazolam overdose/ depression. Patient  currently on suicidal precautions and IVC. She is more awake and alert, no confusion or agitation  Plan to continue neuro checks per unit protocol and follow up with psychiatry recommendations. Discontinue telemetry monitoring.   On duloxetine, discontinue lorazepam.   2. Left hip pain Continue pain control with as needed acetaminophen and ibuprofen.   3. Left bundle branch block with mild prolongation of Qtc. In the setting of prolonged qrs, qtc can be falsely prolonged.  Ok to discontinue telemetry.  Keep K at 4 and Mg at 2.    4, HTN/ dyslipidemia. Continue blood pressure control with amlodipine and metoprolol.  Continue with statin therapy.   5. Hypothyroid. Continue with levothyroxine. TSH normal range.   Patient continue to be at high risk for worsening depression   Status is: Inpatient  Remains inpatient appropriate because:Inpatient level of care appropriate due to severity of illness  Dispo: The patient is from: Home              Anticipated d/c is to:  to be determined               Patient currently is not medically stable to d/c.   Difficult to place patient No   DVT prophylaxis: Enoxaparin   Code Status:    full  Family Communication:   I spoke over the phone with the patient's daughter about patient's  condition, plan of care, prognosis and all questions were addressed.    Consultants:  Psychiatry    Subjective: Patient is feeling better, no somnolence or lethargy, no nausea or vomiting, no chest pain or dyspnea.   Objective: Vitals:   08/11/21 2320 08/12/21 0337 08/12/21 0846 08/12/21 1213  BP: (!) 142/80 111/87 117/86 125/78  Pulse: 89 85 78 85  Resp: '19 17 18 17  '$ Temp: 99 F (37.2 C) 97.8 F (36.6 C) 98.5 F (36.9 C) 98 F (36.7 C)  TempSrc: Oral  Oral Oral  SpO2: 97% 94% 99% 100%    Intake/Output Summary (Last 24 hours) at 08/12/2021 1326 Last data filed at 08/12/2021 1000 Gross per 24 hour  Intake 1031.67 ml  Output --  Net 1031.67 ml    There were no vitals filed for this visit.  Examination:   General: Not in pain or dyspnea. Deconditioned  Neurology: Awake and alert, non focal  E ENT: no pallor, no icterus, oral mucosa moist Cardiovascular: No JVD. S1-S2 present, rhythmic, no gallops, rubs, or murmurs. No lower extremity edema. Pulmonary: positive breath sounds bilaterally, adequate air movement, no wheezing, rhonchi or rales. Gastrointestinal. Abdomen soft and non tender Skin. No rashes Musculoskeletal: no joint deformities     Data Reviewed: I have personally reviewed following labs and imaging studies  CBC: Recent Labs  Lab 08/11/21 0922  WBC 7.3  NEUTROABS 4.1  HGB 13.4  HCT 42.2  MCV 83.2  PLT [id number]   Basic Metabolic Panel: Recent Labs  Lab 08/11/21 0922 08/11/21 1059 08/12/21 0216  NA 139  --  138  K 3.6  --  4.2  CL 106  --  107  CO2 23  --  24  GLUCOSE 120*  --  124*  BUN 10  --  6*  CREATININE 0.73  --  0.74  CALCIUM 9.2  --  9.3  MG  --  2.0  --    GFR: Estimated Creatinine Clearance: 73.9 mL/min (by C-G formula based on SCr of 0.74 mg/dL). Liver Function Tests: Recent Labs  Lab 08/11/21 0922 08/12/21 0216  AST 18 16  ALT 15 14  ALKPHOS 156* 162*  BILITOT 0.5 0.7  PROT 6.4* 6.4*  ALBUMIN 3.4* 3.2*   No results for input(s): LIPASE, AMYLASE in the last 168 hours. No results for input(s): AMMONIA in the last 168 hours. Coagulation Profile: No results for input(s): INR, PROTIME in the last 168 hours. Cardiac Enzymes: No results for input(s): CKTOTAL, CKMB, CKMBINDEX, TROPONINI in the last 168 hours. BNP (last 3 results) No results for input(s): PROBNP in the last 8760 hours. HbA1C: No results for input(s): HGBA1C in the last 72 hours. CBG: Recent Labs  Lab 08/11/21 0912  GLUCAP 116*   Lipid Profile: No results for input(s): CHOL, HDL, LDLCALC, TRIG, CHOLHDL, LDLDIRECT in the last 72 hours. Thyroid Function Tests: Recent Labs    08/11/21 0922 08/11/21 0926   TSH  --  2.807  FREET4 0.89  --    Anemia Panel: No results for input(s): VITAMINB12, FOLATE, FERRITIN, TIBC, IRON, RETICCTPCT in the last 72 hours.    Radiology Studies: I have reviewed all of the imaging during this hospital visit personally     Scheduled Meds:  amLODipine  10 mg Oral Daily   aspirin EC  81 mg Oral Daily   atorvastatin  10 mg Oral Daily   DULoxetine  60 mg Oral Daily   enoxaparin (LOVENOX) injection  40 mg Subcutaneous Q24H   levothyroxine  75 mcg Oral Q0600   melatonin  5 mg Oral QHS   metoprolol succinate  25 mg Oral Daily   pantoprazole  40 mg Oral Daily   polyethylene glycol  17 g Oral Daily   Continuous Infusions:   LOS: 1 day  Hayli Milligan Gerome Apley, MD

## 2021-08-12 NOTE — Plan of Care (Signed)

## 2021-08-13 NOTE — Progress Notes (Addendum)
PROGRESS NOTE    Denise Denise [name]  [id number] DOB: Jan 29, 1951 DOA: 08/11/2021 PCP: Pleas Koch, NP    Brief Narrative:  Denise Denise [name] was admitted to the hospital with the working diagnosis of acute toxic encephalopathy due to benzodiazepine overdose.    70 year old Denise [name] past medical history for hypertension, GERD, hypothyroidism, TIA anxiety and depression who presented feeling depressed.  Patient was found by her friend confused and somnolent, 911 was called and patient was brought to the hospital. The night prior she felt depressed and took 10 to 15 tablets of 0.25 mg of alprazolam over the course of 10 to 12 hours.  On her initial physical examination blood pressure 136/88, heart rate 82, respirate 18, oxygen saturation 96% her lungs are clear to auscultation bilaterally, heart S1-S2, present, rhythmic, soft abdomen, no lower extremity edema, she had left hip pain and left rib cage tenderness to palpation.   Sodium 139, potassium 3.6, chloride 106, bicarb 23, glucose 120, BUN 10, creatinine 0.73, AST 18, ALT 15, white count 7.3, hemoglobin 13.4, hematocrit 42.2, platelets 294. SARS COVID-19 negative.   Urinalysis specific gravity 1.006, negative nitrates. Toxicology less than 10 alcohol, less than 10 salicylate, positive for benzodiazepine.   Head CT no acute changes.   Chest radiograph with increased interstitial markings at bases, no frank infiltrates.   EKG 83 bpm, left axis deviation, left bundle branch block, QTC 509, sinus rhythm, Q-wave V1-V2, poor R wave progression, no significant ST segment or T wave changes.   Patient with improvement of her symptoms with supportive medical therapy. Psychiatry consulted with recommendations for inpatient geropsychiatry unit.   Patient is medically stable for transfer pending TOC placement.     Assessment & Plan:   Principal Problem:   Overdose of benzodiazepine Active Problems:   Hypothyroidism   GERD  (gastroesophageal reflux disease)   Pituitary adenoma ([place])   Essential hypertension   Hyperlipidemia   Anxiety and depression   Benzodiazepine intoxication ([place])   Acute toxic encephalopathy due to alprazolam overdose/ depression.  No confusion or agitation, continue with one to one sitter, suicidal precautions.   Continue with duloxetine, discontinue lorazepam.  Plan to transfer to geropsychiatry unit.     2. Left hip pain PRN acetaminophen and ibuprofen.    3. Left bundle branch block with mild prolongation of Qtc. In the setting of prolonged qrs, qtc can be falsely prolonged.   Patient off telemetry    4, HTN/ dyslipidemia. On amlodipine and metoprolol for blood pressure control.   On statin therapy.    5. Hypothyroid. On levothyroxine. TSH normal range  Status is: Inpatient  Remains inpatient appropriate because:Inpatient level of care appropriate due to severity of illness  Dispo: The patient is from: Home              Anticipated d/c is to:  geropsychiatry unit               Patient currently is medically stable to d/c.   Difficult to place patient No   DVT prophylaxis: Enoxaparin   Code Status:    full  Family Communication:  I spoke over the phone with the patient's daughter about patient's  condition, plan of care, prognosis and all questions were addressed.    Consultants:  Psychiatry    Subjective: Patient with no confusion or agitation, no nausea or vomiting, no chest pain or dyspnea,.   Objective: Vitals:   08/12/21 1930 08/13/21 0100 08/13/21 0405 08/13/21 0845  BP: (!) 116/92  132/82 121/77 136/88  Pulse: 80 89 79 79  Resp:  '18 18 18  '$ Temp: 98 F (36.7 C) 97.6 F (36.4 C) 97.9 F (36.6 C) 97.6 F (36.4 C)  TempSrc:  Oral Oral Oral  SpO2: 98% 97% 98% 98%    Intake/Output Summary (Last 24 hours) at 08/13/2021 1219 Last data filed at 08/13/2021 1000 Gross per 24 hour  Intake 1560 ml  Output --  Net 1560 ml   There were no vitals filed for  this visit.  Examination:   General: Not in pain or dyspnea.  Neurology: Awake and alert, non focal  E ENT: no pallor, no icterus, oral mucosa moist Cardiovascular: No JVD. S1-S2 present, rhythmic, no gallops, rubs, or murmurs. No lower extremity edema. Pulmonary: positive breath sounds bilaterally, adequate air movement, no wheezing, rhonchi or rales. Gastrointestinal. Abdomen  soft and non tender Skin. No rashes Musculoskeletal: no joint deformities   Data Reviewed: I have personally reviewed following labs and imaging studies  CBC: Recent Labs  Lab 08/11/21 0922  WBC 7.3  NEUTROABS 4.1  HGB 13.4  HCT 42.2  MCV 83.2  PLT [id number]   Basic Metabolic Panel: Recent Labs  Lab 08/11/21 0922 08/11/21 1059 08/12/21 0216  NA 139  --  138  K 3.6  --  4.2  CL 106  --  107  CO2 23  --  24  GLUCOSE 120*  --  124*  BUN 10  --  6*  CREATININE 0.73  --  0.74  CALCIUM 9.2  --  9.3  MG  --  2.0  --    GFR: Estimated Creatinine Clearance: 73.9 mL/min (by C-G formula based on SCr of 0.74 mg/dL). Liver Function Tests: Recent Labs  Lab 08/11/21 0922 08/12/21 0216  AST 18 16  ALT 15 14  ALKPHOS 156* 162*  BILITOT 0.5 0.7  PROT 6.4* 6.4*  ALBUMIN 3.4* 3.2*   No results for input(s): LIPASE, AMYLASE in the last 168 hours. No results for input(s): AMMONIA in the last 168 hours. Coagulation Profile: No results for input(s): INR, PROTIME in the last 168 hours. Cardiac Enzymes: No results for input(s): CKTOTAL, CKMB, CKMBINDEX, TROPONINI in the last 168 hours. BNP (last 3 results) No results for input(s): PROBNP in the last 8760 hours. HbA1C: No results for input(s): HGBA1C in the last 72 hours. CBG: Recent Labs  Lab 08/11/21 0912  GLUCAP 116*   Lipid Profile: No results for input(s): CHOL, HDL, LDLCALC, TRIG, CHOLHDL, LDLDIRECT in the last 72 hours. Thyroid Function Tests: Recent Labs    08/11/21 0922 08/11/21 0926  TSH  --  2.807  FREET4 0.89  --    Anemia  Panel: No results for input(s): VITAMINB12, FOLATE, FERRITIN, TIBC, IRON, RETICCTPCT in the last 72 hours.    Radiology Studies: I have reviewed all of the imaging during this hospital visit personally     Scheduled Meds:  amLODipine  10 mg Oral Daily   aspirin EC  81 mg Oral Daily   atorvastatin  10 mg Oral Daily   DULoxetine  60 mg Oral Daily   enoxaparin (LOVENOX) injection  40 mg Subcutaneous Q24H   levothyroxine  75 mcg Oral Q0600   melatonin  5 mg Oral QHS   metoprolol succinate  25 mg Oral Daily   pantoprazole  40 mg Oral Daily   polyethylene glycol  17 g Oral Daily   Continuous Infusions:   LOS: 2 days  Aladdin Kollmann Gerome Apley, MD

## 2021-08-13 NOTE — Plan of Care (Signed)

## 2021-08-14 MED ORDER — TRAZODONE HCL 50 MG PO TABS
50.0000 mg | ORAL_TABLET | Freq: Every day | ORAL | Status: DC
  Administered 2021-08-14: 50 mg via ORAL
  Filled 2021-08-14: qty 1

## 2021-08-14 NOTE — Progress Notes (Addendum)
PROGRESS NOTE    Denise [name]  [id number] DOB: 04/05/1951 DOA: 08/11/2021 PCP: Pleas Koch, NP    Brief Narrative:  Denise [name] was admitted to the hospital with the working diagnosis of acute toxic encephalopathy due to benzodiazepine overdose.  Now pending transfer to psych unit.   70 year old female past medical history for hypertension, GERD, hypothyroidism, TIA anxiety and depression who presented feeling depressed.  Patient was found by her friend confused and somnolent, 911 was called and patient was brought to the hospital. The night prior she felt depressed and took 10 to 15 tablets of 0.25 mg of alprazolam over the course of 10 to 12 hours.  On her initial physical examination blood pressure 136/88, heart rate 82, respiratory rate 18, oxygen saturation 96% her lungs were clear to auscultation bilaterally, heart S1-S2, present, rhythmic, soft abdomen, no lower extremity edema, she had left hip pain and left rib cage tenderness to palpation.   Sodium 139, potassium 3.6, chloride 106, bicarb 23, glucose 120, BUN 10, creatinine 0.73, AST 18, ALT 15, white count 7.3, hemoglobin 13.4, hematocrit 42.2, platelets 294. SARS COVID-19 negative.   Urinalysis specific gravity 1.006, negative nitrates. Toxicology less than 10 alcohol, less than 10 salicylate, positive for benzodiazepine.   Head CT no acute changes.   Chest radiograph with increased interstitial markings at bases, no frank infiltrates.   EKG 83 bpm, left axis deviation, left bundle branch block, QTC 509, sinus rhythm, Q-wave V1-V2, poor R wave progression, no significant ST segment or T wave changes.   Patient had improvement of her symptoms with supportive medical therapy. Psychiatry consulted with recommendations for inpatient geropsychiatry unit.    Patient is medically stable for transfer pending TOC placement   Assessment & Plan:   Principal Problem:   Overdose of benzodiazepine Active  Problems:   Hypothyroidism   GERD (gastroesophageal reflux disease)   Pituitary adenoma ([place])   Essential hypertension   Hyperlipidemia   Anxiety and depression   Benzodiazepine intoxication ([place])     Acute toxic encephalopathy due to alprazolam overdose/ depression.  Continue on suicidal precautions and one to one observation.   On duloxetine  with good toleration.  Add trazodone for sleep at night.  Plan to transfer to geropsychiatry unit.     2. Left hip pain Pain control with as needed acetaminophen and ibuprofen.    3. Left bundle branch block with mild prolongation of Qtc. In the setting of prolonged qrs, qtc can be falsely prolonged.    Patient now off telemetry with good toleration.     4, HTN/ dyslipidemia. Continue blood pressure control with amlodipine and metoprolol.   Continue with atorvastatin.    5. Hypothyroid. Continue with levothyroxine. TSH normal range  Status is: Inpatient  Remains inpatient appropriate because:Inpatient level of care appropriate due to severity of illness  Dispo: The patient is from: Home              Anticipated d/c is to:  psych units              Patient currently is medically stable to d/c.   Difficult to place patient No   DVT prophylaxis: Enoxaparin   Code Status:    full  Family Communication:   I spoke over the phone with the patient's daughter about patient's  condition, plan of care, prognosis and all questions were addressed.    Consultants:  Psychiatry    Subjective: Patient is feeling well, no nausea or vomiting, no  chest pain or dyspnea. Continue to have insomnia.   Objective: Vitals:   08/14/21 0544 08/14/21 0856 08/14/21 1233 08/14/21 1234  BP: 125/80 (!) 125/91 114/76   Pulse: 73 81 85   Resp: '18 18 20   '$ Temp: 99.1 F (37.3 C) 98.7 F (37.1 C) 98 F (36.7 C) 98 F (36.7 C)  TempSrc: Oral   Oral  SpO2: 96% 97% 96%     Intake/Output Summary (Last 24 hours) at 08/14/2021 1239 Last data filed at  08/14/2021 1227 Gross per 24 hour  Intake 920 ml  Output --  Net 920 ml   There were no vitals filed for this visit.  Examination:   General: Not in pain or dyspnea  Neurology: Awake and alert, non focal  E ENT: no pallor, no icterus, oral mucosa moist Cardiovascular: No JVD. S1-S2 present, rhythmic, no gallops, rubs, or murmurs. No lower extremity edema. Pulmonary: positive breath sounds bilaterally, with no wheezing,  Gastrointestinal. Abdomen soft and non tender Skin. No rashes Musculoskeletal: no joint deformities     Data Reviewed: I have personally reviewed following labs and imaging studies  CBC: Recent Labs  Lab 08/11/21 0922  WBC 7.3  NEUTROABS 4.1  HGB 13.4  HCT 42.2  MCV 83.2  PLT [id number]   Basic Metabolic Panel: Recent Labs  Lab 08/11/21 0922 08/11/21 1059 08/12/21 0216  NA 139  --  138  K 3.6  --  4.2  CL 106  --  107  CO2 23  --  24  GLUCOSE 120*  --  124*  BUN 10  --  6*  CREATININE 0.73  --  0.74  CALCIUM 9.2  --  9.3  MG  --  2.0  --    GFR: CrCl cannot be calculated (Unknown ideal weight.). Liver Function Tests: Recent Labs  Lab 08/11/21 0922 08/12/21 0216  AST 18 16  ALT 15 14  ALKPHOS 156* 162*  BILITOT 0.5 0.7  PROT 6.4* 6.4*  ALBUMIN 3.4* 3.2*   No results for input(s): LIPASE, AMYLASE in the last 168 hours. No results for input(s): AMMONIA in the last 168 hours. Coagulation Profile: No results for input(s): INR, PROTIME in the last 168 hours. Cardiac Enzymes: No results for input(s): CKTOTAL, CKMB, CKMBINDEX, TROPONINI in the last 168 hours. BNP (last 3 results) No results for input(s): PROBNP in the last 8760 hours. HbA1C: No results for input(s): HGBA1C in the last 72 hours. CBG: Recent Labs  Lab 08/11/21 0912  GLUCAP 116*   Lipid Profile: No results for input(s): CHOL, HDL, LDLCALC, TRIG, CHOLHDL, LDLDIRECT in the last 72 hours. Thyroid Function Tests: No results for input(s): TSH, T4TOTAL, FREET4, T3FREE,  THYROIDAB in the last 72 hours. Anemia Panel: No results for input(s): VITAMINB12, FOLATE, FERRITIN, TIBC, IRON, RETICCTPCT in the last 72 hours.    Radiology Studies: I have reviewed all of the imaging during this hospital visit personally     Scheduled Meds:  amLODipine  10 mg Oral Daily   aspirin EC  81 mg Oral Daily   atorvastatin  10 mg Oral Daily   DULoxetine  60 mg Oral Daily   enoxaparin (LOVENOX) injection  40 mg Subcutaneous Q24H   levothyroxine  75 mcg Oral Q0600   melatonin  5 mg Oral QHS   metoprolol succinate  25 mg Oral Daily   pantoprazole  40 mg Oral Daily   polyethylene glycol  17 g Oral Daily   Continuous Infusions:   LOS: 3 days  Deseree Zemaitis Gerome Apley, MD

## 2021-08-14 NOTE — Progress Notes (Signed)
CSW received a phone call from [place], Iona Beard [phone] requesting more information on pt and shared CSW Miranda's information. I also received a phone call from [facility] [phone] who reports that they have no beds today but to call after 10am tomorrow. CSW communicated via secured chat with pt's care team to assist and follow includes LCSW and RN. CSW confirmed that Bb Edwin Dada, RN seen message via secure chat and CSW called Rosendo Gros RN case manager [phone] to advised of the phone call received for pt's potential bed offer.  Benjaman Kindler, MSW, LCSWA 08/14/2021 '@5'$ :19 PM

## 2021-08-14 NOTE — Progress Notes (Signed)
CSW received a phone call from [place], Iona Beard [phone] requesting more information on pt and shared CSW Miranda's information. I also received a phone call from [facility] [phone] who reports that they have no beds today but to call after 10am tomorrow. CSW communicate with RN Bb Mellody Dance, RN about communicating transportation with oncoming shift nursing and social work.      Benjaman Kindler, MSW, [facility] 08/14/2021 5:40 PM

## 2021-08-14 NOTE — TOC Initial Note (Signed)
Transition of Care [facility]) - Initial/Assessment Note    Patient Details  Name: Denise [name] MRN: [id number] Date of Birth: Jul 10, 1951  Transition of Care [facility]) CM/SW Contact:    Emeterio Reeve, LCSW Phone Number: 08/14/2021, 4:09 PM  Clinical Narrative:                  CSW spoke to pt on phone. Pt reports she lives at home alone. Pt has two daughters that live in [place]. Pt reports she was independent PTA with mobility and ADL's.   Pt is recommended for Geripsych. Pt states that she would like to go ASAP so she can get back to life. CSW explained that Geri sych beds are hard to come by. Pt has been sent out to facilities in the hub.   TOC will follow.  Expected Discharge Plan: Psychiatric Hospital Barriers to Discharge: Family Issues, [place], [place] ACT Team   Patient Goals and CMS Choice Patient states their goals for this hospitalization and ongoing recovery are:: TO get better      Expected Discharge Plan and Services Expected Discharge Plan: [facility]       Living arrangements for the past 2 months: [place]                                      Prior Living Arrangements/Services Living arrangements for the past 2 months: Single Family Home Lives with:: Self Patient language and need for interpreter reviewed:: Yes Do you feel safe going back to the place where you live?: Yes      Need for Family Participation in Patient Care: Yes (Comment) Care giver support system in place?: Yes (comment)   Criminal Activity/Legal Involvement Pertinent to Current Situation/Hospitalization: No - Comment as needed  Activities of Daily Living      Permission Sought/Granted   Permission granted to share information with : Yes, Verbal Permission Granted     Permission granted to share info w AGENCY: Inpatient psych facilities        Emotional Assessment Appearance:: Appears  stated age Attitude/Demeanor/Rapport: Engaged Affect (typically observed): Appropriate Orientation: : Oriented to Self, Oriented to Place, Oriented to  Time, Oriented to Situation Alcohol / Substance Use: Not Applicable Psych Involvement: Yes (comment)  Admission diagnosis:  Pain [R52] Overdose of benzodiazepine [T42.4X1A] Drug overdose, undetermined intent, initial encounter [T50.904A] Altered mental status, unspecified altered mental status type [R41.82] Benzodiazepine intoxication ([place]) [F13.129] Chest pain, unspecified type [R07.9] Patient Active Problem List   Diagnosis Date Noted   Overdose of benzodiazepine 08/11/2021   Benzodiazepine intoxication ([place])    Neck pain on right side 07/31/2021   Preventative health care 06/23/2021   Sleep disturbance 06/01/2021   Anxiety and depression 11/02/2020   Dysuria 11/02/2020   Lumbosacral spondylosis with radiculopathy 10/20/2020   Breast pain, left 04/18/2020   Elevated alkaline phosphatase level 02/11/2020   Diverticulosis of colon without hemorrhage 01/21/2020   Hx of adenomatous colonic polyps 01/21/2020   Left lower quadrant abdominal pain 10/22/2019   Excessive sweating 10/13/2019   Prediabetes 10/13/2019   Hyperlipidemia 02/02/2019   Colitis 01/26/2019   Chest pain 02/25/2017   Welcome to Medicare preventive visit 08/25/2015   Essential hypertension 08/25/2015   Pituitary adenoma ([place]) 05/12/2014   GERD (gastroesophageal reflux disease) 09/01/2012   Hypothyroidism    Osteopenia    Migraine  Fibroid    HSV-1 (herpes simplex virus 1) infection    PCP:  Pleas Koch, NP Pharmacy[facility]   [id number] [place]Phone: [phone]: [phone] [facility]  [address] [place]Phone: [phone]: [phone]    Social  Determinants of Health (SDOH) Interventions    Readmission Risk Interventions No flowsheet data found.  MEmeterio Reeve LSouth CarolinaClinical Social Worker [phone]

## 2021-08-14 NOTE — Progress Notes (Signed)
Primary RN received call from Howell Rucks RN, admission nurse, at [facility] in [place], [place].  Tamala Julian RN called inquiring about patient ability to complete ADLs and if there is an ongoing need for bed placement for this patient. Patient has been conditionally accepted to [facility] for 08/15/21. Accepting doctor is Dr. Jonelle Sports. Team to call [phone] to formally accept the bed on 8/30. Patient is able to be transported after 0900 on 8/30 after calling report.  Will pass onto day shift RN, SW unavailable overnight.

## 2021-08-15 ENCOUNTER — Ambulatory Visit: Payer: Medicare Other | Admitting: Primary Care

## 2021-08-15 MED ORDER — TRAZODONE HCL 50 MG PO TABS: 50.0000 mg | ORAL_TABLET | Freq: Every day | ORAL | 0 refills | Status: DC

## 2021-08-15 NOTE — TOC Transition Note (Signed)
Transition of Care [facility]) - CM/SW Discharge Note   Patient Details  Name: Stevanie Britts MRN: [id number] Date of Birth: [birth year]  Transition of Care [facility]) CM/SW Contact:  Emeterio Reeve, LCSW Phone Number: 08/15/2021, 3:49 PM   Clinical Narrative:     Patient will DC to: [facility] Anticipated DC date: 08/15/21 Family notified: Pt will update Transport by: [facility] Dept      Per MD patient ready for DC to [facility]. Accepting doctor is [facility]. Pt will go to the main campus.    RN to call report to [phone]  CSW will sign off for now as social work intervention is no longer needed. Please consult [place] again if new needs arise.   Final next level of care: Psychiatric Hospital Barriers to Discharge: Barriers Resolved   Patient Goals and CMS Choice Patient states their goals for this hospitalization and ongoing recovery are:: TO get better      Discharge Placement              Patient chooses bed at: Other - please specify in the comment section below: [facility]) Patient to be transferred to facility by: [place] Name of family member notified: Pt will notify Patient and family notified of of transfer: 08/15/21  Discharge Plan and Services                                     Social Determinants of Health (SDOH) Interventions     Readmission Risk Interventions No flowsheet data found.   Emeterio Reeve, LCSW Clinical Social Worker

## 2021-08-15 NOTE — Plan of Care (Cosign Needed)
Psychiatry plan of care  Patient was seen and assessed on Saturday by  NP [name] Ala and was recommended for inpatient psychiatric admission due to benzodiazepine overdose.  Patient is under IVC.Patient has bed offers at [facility] and [facility].  Got a message from social worker that patient wants to speak to psychiatry about disposition. Patient is seen today.  Patient states she does not want to go to inpatient but can do outpatient treatment for depression.  Patient states she overdosed on benzodiazepine because she just wanted to have good sleep.  Patient states she does not agree with recommendations for inpatient and IVC.  Patient states that the person who assessed her did not talk to her much.  Explained that we recommended inpatient based on her overdosing on benzodiazepines and depression and she was assessed by NP on Saturday.  Patient is tearful and depressed and states she has a a lot on her plate as she has been taking care of her disabled sister.  Patient states she is the main support for her sister and if she goes inpatient, nobody will be available to take care of her sister.  She also shared that her daughter is leaving this Saturday for college and she will not be able to see her.  Explained that it would be beneficial for her to go to inpatient because of current level of stress, insomnia and depression symptoms.  Explained that she will also be assessed by a physician when she goes inpatient.  Patient still do not agree with the recommendation but agrees to go to inpatient at [facility].  Patient states that she would prefer to go to [facility].  Patient denies any other concerns and appreciated talking to her.  Dr Armando Reichert MD [place] [facility]

## 2021-08-15 NOTE — Discharge Summary (Signed)
Physician Discharge Summary  Denise [name] [id number] DOB: 12-05-1951 DOA: 08/11/2021  PCP: Pleas Koch, NP  Admit date: 08/11/2021  Discharge date: 08/15/2021  Admitted From: Home.  Disposition: Inpatient psych facility.  Recommendations for Outpatient Follow-up:  Follow up with PCP in 1-2 weeks. Please obtain BMP/CBC in one week. Patient is being discharged to inpatient psych facility for stabilization. Patient was admitted with intentional drug overdose.  Home Health: None Equipment/Devices: None  Discharge Condition: Good CODE STATUS:Full code Diet recommendation: Heart Healthy  Brief [facility] Course: This 70 year old female with past medical history for hypertension, GERD, hypothyroidism, TIA, anxiety and depression who presented feeling depressed. Patient was found by her friend confused and somnolent, 911 was called and patient was brought to the hospital. The night prior she felt depressed and took 10 to 15 tablets of 0.25 mg of alprazolam over the course of 10 to 12 hours.  Toxicology less than 10 alcohol, less than 10 salicylate, positive for benzodiazepine. CT head unremarkable.  Patient was admitted for drug overdose as a suicidal attempt.  Patient was managed with supportive care.  She had improvement in her symptoms,  Psychiatry was consulted  recommended inpatient Geri psychiatric hospitalization.  Patient is medically clear.  Patient is being transferred to inpatient psych facility.  She was managed for below problems.   Discharge Diagnoses:  Principal Problem:   Overdose of benzodiazepine Active Problems:   Hypothyroidism   GERD (gastroesophageal reflux disease)   Pituitary adenoma ([place])   Essential hypertension   Hyperlipidemia   Anxiety and depression   Benzodiazepine intoxication ([place])  Acute toxic encephalopathy due to alprazolam overdose/ depression.  Discontinue suicidal precautions and one-to-one observation. Continue  Cymbalta Continue trazodone at night for sleep. Plan to transfer to geropsychiatry unit.     Left hip pain:  Pain control with as needed acetaminophen and ibuprofen.    Left bundle branch block with mild prolongation of Qtc.  In the setting of prolonged qrs, qtc can be falsely prolonged.  Patient now off telemetry with good toleration.     HTN/ dyslipidemia.  Continue blood pressure control with amlodipine and metoprolol.   Continue with atorvastatin.    Hypothyroidism. Continue with levothyroxine. TSH normal range  Discharge Instructions  Discharge Instructions     Call MD for:  difficulty breathing, headache or visual disturbances   Complete by: As directed    Call MD for:  persistant dizziness or light-headedness   Complete by: As directed    Call MD for:  persistant nausea and vomiting   Complete by: As directed    Diet - low sodium heart healthy   Complete by: As directed    Diet general   Complete by: As directed    Discharge instructions   Complete by: As directed    Patient is being discharged to inpatient psych facility for stabilization. Patient admitted with intentional drug overdose.   Increase activity slowly   Complete by: As directed       Allergies as of 08/15/2021       Reactions   Tramadol Other (See Comments)   Reaction: NIGHTMARES        Medication List     STOP taking these medications    hydrOXYzine 10 MG tablet Commonly known as: ATARAX/VISTARIL       TAKE these medications    amLODipine 10 MG tablet Commonly known as: NORVASC TAKE 1 TABLET BY MOUTH  DAILY FOR BLOOD PRESSURE What changed: additional instructions  aspirin 81 MG tablet Take 81 mg by mouth daily.   atorvastatin 10 MG tablet Commonly known as: LIPITOR TAKE 1 TABLET BY MOUTH  DAILY FOR CHOLESTEROL What changed: See the new instructions.   DULoxetine 60 MG capsule Commonly known as: CYMBALTA TAKE 1 CAPSULE BY MOUTH  DAILY FOR HEADACHE  PREVENTION    esomeprazole 20 MG capsule Commonly known as: NEXIUM Take 1 capsule (20 mg total) by mouth every morning.   levothyroxine 75 MCG tablet Commonly known as: SYNTHROID Take 1 tablet by mouth every morning on an empty stomach with water only.  No food or other medications for 30 minutes. What changed:  how much to take how to take this when to take this   metoprolol succinate 25 MG 24 hr tablet Commonly known as: TOPROL-XL TAKE 1 TABLET BY MOUTH  DAILY FOR BLOOD PRESSURE What changed: additional instructions   rizatriptan 5 MG tablet Commonly known as: MAXALT Take 1 tablet by mouth at migraine onset. May repeat with 1 tablet in 2 hours if needed. What changed:  how much to take how to take this when to take this reasons to take this additional instructions   traZODone 50 MG tablet Commonly known as: DESYREL Take 1 tablet (50 mg total) by mouth at bedtime.        Follow-up Information     Pleas Koch, NP Follow up in 1 week(s).   Specialty: Internal Medicine Contact information: [place] [address] [phone]                Allergies  Allergen Reactions   Tramadol Other (See Comments)    Reaction: NIGHTMARES    Consultations: Psychiatry   Procedures/Studies: CT HEAD WO CONTRAST (5MM)  Result Date: 08/11/2021 CLINICAL DATA:  70 year old female with history of altered mental status. EXAM: CT HEAD WITHOUT CONTRAST TECHNIQUE: Contiguous axial images were obtained from the base of the skull through the vertex without intravenous contrast. COMPARISON:  Brain MRI 03/15/2020. FINDINGS: Brain: Patchy areas of decreased attenuation are noted throughout the deep and periventricular white matter of the cerebral hemispheres bilaterally, compatible with mild chronic microvascular ischemic disease. No evidence of acute infarction, hemorrhage, hydrocephalus, extra-axial collection or mass lesion/mass effect. Vascular: No hyperdense vessel or  unexpected calcification. Skull: Normal. Negative for fracture or focal lesion. Sinuses/Orbits: No acute finding. Small amount of mucosal thickening in the maxillary sinuses bilaterally. In the anterior aspect of the right maxillary sinus there is a 1.6 cm mucosal retention cyst or polyp. Other: None. IMPRESSION: 1. No acute intracranial abnormalities. 2. Mild chronic microvascular ischemic changes in the cerebral white matter again noted. Electronically Signed   By: Vinnie Langton M.D.   On: 08/11/2021 10:04   DG Chest Portable 1 View  Result Date: 08/11/2021 CLINICAL DATA:  70 year old female with history of altered mental status. Drug overdose. EXAM: PORTABLE CHEST 1 VIEW COMPARISON:  Chest x-ray 04/19/2011. FINDINGS: Lung volumes are slightly low. Ill-defined opacities at the left base. Widespread interstitial prominence and diffuse peribronchial cuffing. Possible trace left pleural effusion. No right pleural effusion. No pneumothorax. No evidence of pulmonary edema. Heart size is borderline enlarged. Upper mediastinal contours are within normal limits. IMPRESSION: 1. Diffuse peribronchial cuffing and interstitial prominence may suggest an acute bronchitis. Ill-defined opacities at the left lung base may reflect atelectasis and/or developing airspace consolidation. 2. Possible trace left pleural effusion. Electronically Signed   By: Vinnie Langton M.D.   On: 08/11/2021 09:37  DG HIP UNILAT WITH PELVIS 2-3 VIEWS LEFT  Result Date: 08/11/2021 CLINICAL DATA:  Left hip pain EXAM: DG HIP (WITH OR WITHOUT PELVIS) 2-3V LEFT COMPARISON:  None. FINDINGS: Single AP view of the pelvis demonstrates mild degenerative changes of the bilateral SI joints. Pubic symphysis is unremarkable. Visualized sacrum is intact. Single AP view of the right hip demonstrates no acute osseous abnormality. Calcifications in the pelvis, likely phleboliths. No acute fracture or dislocation of the left hip. Minimal osteophyte  formation with well preserved joint space. Soft tissues are unremarkable. IMPRESSION: No acute fracture or dislocation of the left hip. Electronically Signed   By: Yetta Glassman M.D.   On: 08/11/2021 15:09      Subjective: Patient was seen and examined at bedside.  Overnight events noted.   Patient reports feeling much improved.  Patient is being discharged to psych facility today.  Discharge Exam: Vitals:   08/15/21 0737 08/15/21 1304  BP: 109/81 111/84  Pulse: 86 88  Resp: 18 16  Temp: 98 F (36.7 C) 98.1 F (36.7 C)  SpO2: 97% 96%   Vitals:   08/14/21 2300 08/15/21 0300 08/15/21 0737 08/15/21 1304  BP: 114/76 120/80 109/81 111/84  Pulse: 88 90 86 88  Resp: '18 20 18 16  '$ Temp: 98.4 F (36.9 C) (!) 97.4 F (36.3 C) 98 F (36.7 C) 98.1 F (36.7 C)  TempSrc: Oral Oral Oral Oral  SpO2: 96% 98% 97% 96%    General: Pt is alert, awake, not in acute distress Cardiovascular: RRR, S1/S2 +, no rubs, no gallops Respiratory: CTA bilaterally, no wheezing, no rhonchi Abdominal: Soft, NT, ND, bowel sounds + Extremities: no edema, no cyanosis    The results of significant diagnostics from this hospitalization (including imaging, microbiology, ancillary and laboratory) are listed below for reference.     Microbiology: Recent Results (from the past 240 hour(s))  Resp Panel by RT-PCR (Flu A&B, Covid) Nasopharyngeal Swab     Status: None   Collection Time: 08/11/21 10:59 AM   Specimen: Nasopharyngeal Swab; Nasopharyngeal(NP) swabs in vial transport medium  Result Value Ref Range Status   SARS Coronavirus 2 by RT PCR NEGATIVE NEGATIVE Final    Comment: (NOTE) SARS-CoV-2 target nucleic acids are NOT DETECTED.  The SARS-CoV-2 RNA is generally detectable in upper respiratory specimens during the acute phase of infection. The lowest concentration of SARS-CoV-2 viral copies this assay can detect is 138 copies/mL. A negative result does not preclude SARS-Cov-2 infection and should  not be used as the sole basis for treatment or other patient management decisions. A negative result may occur with  improper specimen collection/handling, submission of specimen other than nasopharyngeal swab, presence of viral mutation(s) within the areas targeted by this assay, and inadequate number of viral copies(<138 copies/mL). A negative result must be combined with clinical observations, patient history, and epidemiological information. The expected result is Negative.  Fact Sheet for Patients:  EntrepreneurPulse.com.au  Fact Sheet for Healthcare Providers:  IncredibleEmployment.be  This test is no t yet approved or cleared by the [place] FDA and  has been authorized for detection and/or diagnosis of SARS-CoV-2 by FDA under an Emergency Use Authorization (EUA). This EUA will remain  in effect (meaning this test can be used) for the duration of the COVID-19 declaration under Section 564(b)(1) of the Act, 21 U.S.C.section 360bbb-3(b)(1), unless the authorization is terminated  or revoked sooner.       Influenza A by PCR NEGATIVE NEGATIVE Final   Influenza B by  PCR NEGATIVE NEGATIVE Final    Comment: (NOTE) The Xpert Xpress SARS-CoV-2/FLU/RSV plus assay is intended as an aid in the diagnosis of influenza from Nasopharyngeal swab specimens and should not be used as a sole basis for treatment. Nasal washings and aspirates are unacceptable for Xpert Xpress SARS-CoV-2/FLU/RSV testing.  Fact Sheet for Patients: EntrepreneurPulse.com.au  Fact Sheet for Healthcare Providers: IncredibleEmployment.be  This test is not yet approved or cleared by the [place] FDA and has been authorized for detection and/or diagnosis of SARS-CoV-2 by FDA under an Emergency Use Authorization (EUA). This EUA will remain in effect (meaning this test can be used) for the duration of the COVID-19 declaration under  Section 564(b)(1) of the Act, 21 U.S.C. section 360bbb-3(b)(1), unless the authorization is terminated or revoked.  Performed at [facility], [place] [address], [place], [place] [address]      Labs: BNP (last 3 results) No results for input(s): BNP in the last 8760 hours. Basic Metabolic Panel: Recent Labs  Lab 08/11/21 0922 08/11/21 1059 08/12/21 0216  NA 139  --  138  K 3.6  --  4.2  CL 106  --  107  CO2 23  --  24  GLUCOSE 120*  --  124*  BUN 10  --  6*  CREATININE 0.73  --  0.74  CALCIUM 9.2  --  9.3  MG  --  2.0  --    Liver Function Tests: Recent Labs  Lab 08/11/21 0922 08/12/21 0216  AST 18 16  ALT 15 14  ALKPHOS 156* 162*  BILITOT 0.5 0.7  PROT 6.4* 6.4*  ALBUMIN 3.4* 3.2*   No results for input(s): LIPASE, AMYLASE in the last 168 hours. No results for input(s): AMMONIA in the last 168 hours. CBC: Recent Labs  Lab 08/11/21 0922  WBC 7.3  NEUTROABS 4.1  HGB 13.4  HCT 42.2  MCV 83.2  PLT 294   Cardiac Enzymes: No results for input(s): CKTOTAL, CKMB, CKMBINDEX, TROPONINI in the last 168 hours. BNP: Invalid input(s): POCBNP CBG: Recent Labs  Lab 08/11/21 0912  GLUCAP 116*   D-Dimer No results for input(s): DDIMER in the last 72 hours. Hgb A1c No results for input(s): HGBA1C in the last 72 hours. Lipid Profile No results for input(s): CHOL, HDL, LDLCALC, TRIG, CHOLHDL, LDLDIRECT in the last 72 hours. Thyroid function studies No results for input(s): TSH, T4TOTAL, T3FREE, THYROIDAB in the last 72 hours.  Invalid input(s): FREET3 Anemia work up No results for input(s): VITAMINB12, FOLATE, FERRITIN, TIBC, IRON, RETICCTPCT in the last 72 hours. Urinalysis    Component Value Date/Time   COLORURINE YELLOW 08/11/2021 1115   APPEARANCEUR CLEAR 08/11/2021 1115   LABSPEC 1.006 08/11/2021 1115   PHURINE 6.0 08/11/2021 1115   GLUCOSEU NEGATIVE 08/11/2021 1115   HGBUR NEGATIVE 08/11/2021 1115   BILIRUBINUR NEGATIVE 08/11/2021 1115    BILIRUBINUR 1+ 11/02/2020 [place] 08/11/2021 1115   PROTEINUR NEGATIVE 08/11/2021 1115   UROBILINOGEN 0.2 11/02/2020 1149   UROBILINOGEN 0.2 07/17/2013 1644   NITRITE NEGATIVE 08/11/2021 1115   LEUKOCYTESUR NEGATIVE 08/11/2021 1115   Sepsis Labs Invalid input(s): PROCALCITONIN,  WBC,  LACTICIDVEN Microbiology Recent Results (from the past 240 hour(s))  Resp Panel by RT-PCR (Flu A&B, Covid) Nasopharyngeal Swab     Status: None   Collection Time: 08/11/21 10:59 AM   Specimen: Nasopharyngeal Swab; Nasopharyngeal(NP) swabs in vial transport medium  Result Value Ref Range Status   SARS Coronavirus 2 by RT PCR  NEGATIVE NEGATIVE Final    Comment: (NOTE) SARS-CoV-2 target nucleic acids are NOT DETECTED.  The SARS-CoV-2 RNA is generally detectable in upper respiratory specimens during the acute phase of infection. The lowest concentration of SARS-CoV-2 viral copies this assay can detect is 138 copies/mL. A negative result does not preclude SARS-Cov-2 infection and should not be used as the sole basis for treatment or other patient management decisions. A negative result may occur with  improper specimen collection/handling, submission of specimen other than nasopharyngeal swab, presence of viral mutation(s) within the areas targeted by this assay, and inadequate number of viral copies(<138 copies/mL). A negative result must be combined with clinical observations, patient history, and epidemiological information. The expected result is Negative.  Fact Sheet for Patients:  EntrepreneurPulse.com.au  Fact Sheet for Healthcare Providers:  IncredibleEmployment.be  This test is no t yet approved or cleared by the [place] FDA and  has been authorized for detection and/or diagnosis of SARS-CoV-2 by FDA under an Emergency Use Authorization (EUA). This EUA will remain  in effect (meaning this test can be used) for the duration of  the COVID-19 declaration under Section 564(b)(1) of the Act, 21 U.S.C.section 360bbb-3(b)(1), unless the authorization is terminated  or revoked sooner.       Influenza A by PCR NEGATIVE NEGATIVE Final   Influenza B by PCR NEGATIVE NEGATIVE Final    Comment: (NOTE) The Xpert Xpress SARS-CoV-2/FLU/RSV plus assay is intended as an aid in the diagnosis of influenza from Nasopharyngeal swab specimens and should not be used as a sole basis for treatment. Nasal washings and aspirates are unacceptable for Xpert Xpress SARS-CoV-2/FLU/RSV testing.  Fact Sheet for Patients: EntrepreneurPulse.com.au  Fact Sheet for Healthcare Providers: IncredibleEmployment.be  This test is not yet approved or cleared by the [place] FDA and has been authorized for detection and/or diagnosis of SARS-CoV-2 by FDA under an Emergency Use Authorization (EUA). This EUA will remain in effect (meaning this test can be used) for the duration of the COVID-19 declaration under Section 564(b)(1) of the Act, 21 U.S.C. section 360bbb-3(b)(1), unless the authorization is terminated or revoked.  Performed at [facility], [place] [address], [place], [place] [address]      Time coordinating discharge: Over 30 minutes  SIGNED:   Shawna Clamp, MD  [facility] 08/15/2021, 4:42 PM Pager   If 7PM-7AM, please contact night-coverage

## 2021-08-15 NOTE — Progress Notes (Addendum)
Called and gave report to Jesse Sans, RN at [facility] ready for transport

## 2021-08-15 NOTE — Plan of Care (Signed)
Alert and oriented. Hemodynamically stable. On room air. No c/o pain. Showered tonight. Moves independently. Trazadone added last night for sleep- patient endorses good response.   Problem: Nutrition: Goal: Adequate nutrition will be maintained Outcome: Progressing   Problem: Elimination: Goal: Will not experience complications related to bowel motility Outcome: Progressing   Problem: Health Behavior/Discharge (Transition) Planning: Goal: Ability to manage health-related needs will improve Outcome: Progressing   Problem: Clinical Measurements: Goal: Remain free from any harm during hospitalization Outcome: Progressing

## 2021-08-15 NOTE — Discharge Instructions (Signed)
Patient is being discharged to inpatient psych facility for stabilization. Patient admitted with intentional drug overdose.

## 2021-08-18 ENCOUNTER — Other Ambulatory Visit: Payer: Medicare Other

## 2021-08-23 ENCOUNTER — Other Ambulatory Visit: Payer: Self-pay | Admitting: Primary Care

## 2021-08-23 DIAGNOSIS — I10 Essential (primary) hypertension: Secondary | ICD-10-CM

## 2021-08-28 ENCOUNTER — Other Ambulatory Visit: Payer: Self-pay | Admitting: Primary Care

## 2021-08-28 DIAGNOSIS — E785 Hyperlipidemia, unspecified: Secondary | ICD-10-CM

## 2021-08-28 DIAGNOSIS — F419 Anxiety disorder, unspecified: Secondary | ICD-10-CM

## 2021-08-28 DIAGNOSIS — E039 Hypothyroidism, unspecified: Secondary | ICD-10-CM

## 2021-08-28 DIAGNOSIS — F32A Depression, unspecified: Secondary | ICD-10-CM

## 2021-08-29 ENCOUNTER — Ambulatory Visit: Payer: Medicare Other

## 2021-09-02 ENCOUNTER — Other Ambulatory Visit: Payer: Self-pay

## 2021-09-02 ENCOUNTER — Ambulatory Visit
Admission: RE | Admit: 2021-09-02 | Discharge: 2021-09-02 | Disposition: A | Discharge status: Home / Self Care | Payer: Medicare Other | Source: Ambulatory Visit | Attending: Family Medicine | Admitting: Family Medicine

## 2021-09-02 DIAGNOSIS — N644 Mastodynia: Secondary | ICD-10-CM

## 2021-09-02 DIAGNOSIS — R922 Inconclusive mammogram: Secondary | ICD-10-CM | POA: Diagnosis not present

## 2021-09-11 DIAGNOSIS — K029 Dental caries, unspecified: Secondary | ICD-10-CM | POA: Diagnosis not present

## 2021-09-26 ENCOUNTER — Other Ambulatory Visit: Payer: Self-pay

## 2021-09-26 ENCOUNTER — Ambulatory Visit: Payer: Medicare Other | Admitting: Primary Care

## 2021-09-26 ENCOUNTER — Telehealth (INDEPENDENT_AMBULATORY_CARE_PROVIDER_SITE_OTHER): Payer: Medicare Other | Admitting: Primary Care

## 2021-09-26 ENCOUNTER — Encounter: Payer: Self-pay | Admitting: Primary Care

## 2021-09-26 VITALS — BP 158/92 | HR 77 | Ht 64.5 in | Wt 209.0 lb

## 2021-09-26 DIAGNOSIS — F419 Anxiety disorder, unspecified: Secondary | ICD-10-CM

## 2021-09-26 DIAGNOSIS — N3281 Overactive bladder: Secondary | ICD-10-CM | POA: Insufficient documentation

## 2021-09-26 DIAGNOSIS — G479 Sleep disorder, unspecified: Secondary | ICD-10-CM

## 2021-09-26 DIAGNOSIS — F32A Depression, unspecified: Secondary | ICD-10-CM | POA: Diagnosis not present

## 2021-09-26 MED ORDER — DULOXETINE HCL 30 MG PO CPEP: 30.0000 mg | ORAL_CAPSULE | Freq: Every day | ORAL | 0 refills | Status: DC

## 2021-09-26 MED ORDER — OXYBUTYNIN CHLORIDE ER 5 MG PO TB24: 5.0000 mg | ORAL_TABLET | Freq: Every day | ORAL | 0 refills | Status: DC

## 2021-09-26 NOTE — Assessment & Plan Note (Signed)
Deteriorated.  Will add in 30 mg of duloxetine to her already existing 60 mg dose.  Discussed to take both 60 mg and 30 mg capsule.  We will see her back in 1 month.  Hospital notes reviewed from hospital stay in August 2022 for overdose.  We do not have the [facility] notes

## 2021-09-26 NOTE — Patient Instructions (Signed)
Start oxybutynin XL 5 mg once daily for overactive bladder.  Start duloxetine 30 mg once daily, add this to your 60 mg dose of duloxetine.  This is for anxiety and depression.  Please schedule follow-up visit for 1 month as discussed.

## 2021-09-26 NOTE — Progress Notes (Signed)
Patient ID: Denise [name], female    DOB: 09-15-1951, 70 y.o.   MRN: [id number]  Virtual visit completed through [place], a video enabled telemedicine application. Due to national recommendations of social distancing due to COVID-19, a virtual visit is felt to be most appropriate for this patient at this time. Reviewed limitations, risks, security and privacy concerns of performing a virtual visit and the availability of in person appointments. I also reviewed that there may be a patient responsible charge related to this service. The patient agreed to proceed.   Patient location: home Provider location: [place] at [facility], office Persons participating in this virtual visit: patient, provider   If any vitals were documented, they were collected by patient at home unless specified below.    BP (!) 158/92   Pulse 77   Ht 5' 4.5" (1.638 m)   Wt 209 lb (94.8 kg)   BMI 35.32 kg/m    CC: Insomnia and urinary incontinence  Subjective:   HPI: Bell Carbo is a 70 y.o. female presenting on 09/26/2021 to discuss insomnia and urinary incontinence.   Long history of sleep disturbance, trouble falling and staying asleep. Mind racing thoughts due to personal anxiety with her family. She has been taking Trazodone 100 mg HS which helps some, but not much. She's tried melatonin without improvement. She has a sleep study scheduled for November 2022. She wakes every 1-2 hours during the night. She is managed on Cymbalta 60 mg for headache prevention.   She endorses a lot of personal stress. Admitted to [facility] for inpatient psychiatric care for intentional drug overdose. Today she denies SI/HI since her discharge. She denies suicide attempt, was taking her sister's Xanax so that she could get some sleep. Admits that she took more than she probably should, but her intent was not for self harm. She was never connected to therapy, she is searching for one.  Chronic and  intermittent urinary incontinence, progressing over the last year. She feels urinary urgency, will lose control of her urine, has to wear a pad. Symptoms are daily. History of hysterectomy. She is interested in medication treatment, never treated.          Relevant past medical, surgical, family and social history reviewed and updated as indicated. Interim medical history since our last visit reviewed. Allergies and medications reviewed and updated. Outpatient Medications Prior to Visit  Medication Sig Dispense Refill   amLODipine (NORVASC) 10 MG tablet TAKE 1 TABLET BY MOUTH  DAILY FOR BLOOD PRESSURE (Patient taking differently: Take 10 mg by mouth daily.) 90 tablet 2   aspirin 81 MG tablet Take 81 mg by mouth daily.     atorvastatin (LIPITOR) 10 MG tablet TAKE 1 TABLET BY MOUTH  DAILY FOR CHOLESTEROL 90 tablet 3   DULoxetine (CYMBALTA) 60 MG capsule TAKE 1 CAPSULE BY MOUTH  DAILY FOR HEADACHE  PREVENTION 90 capsule 3   esomeprazole (NEXIUM) 20 MG capsule Take 1 capsule (20 mg total) by mouth every morning. 30 capsule 11   levothyroxine (SYNTHROID) 75 MCG tablet TAKE 1 TABLET BY MOUTH IN  THE MORNING ON AN EMPTY  STOMACH WITH WATER ONLY. NO FOOD OR OTHER MEDICATIONS  FOR 1/2 HOUR 90 tablet 3   metoprolol succinate (TOPROL-XL) 25 MG 24 hr tablet TAKE 1 TABLET BY MOUTH  DAILY FOR BLOOD PRESSURE 90 tablet 2   rizatriptan (MAXALT) 5 MG tablet Take 1 tablet by mouth at migraine onset. May repeat with 1 tablet  in 2 hours if needed. (Patient taking differently: Take 5 mg by mouth daily as needed for migraine.) 10 tablet 0   traZODone (DESYREL) 50 MG tablet Take 1 tablet (50 mg total) by mouth at bedtime. (Patient taking differently: Take 100 mg by mouth at bedtime.) 30 tablet 0   No facility-administered medications prior to visit.     Per HPI unless specifically indicated in ROS section below Review of Systems  Genitourinary:        Urinary incontinence, urge incontinence    Psychiatric/Behavioral:  Positive for sleep disturbance. Negative for suicidal ideas. The patient is nervous/anxious.   Objective:  BP (!) 158/92   Pulse 77   Ht 5' 4.5" (1.638 m)   Wt 209 lb (94.8 kg)   BMI 35.32 kg/m   Wt Readings from Last 3 Encounters:  09/26/21 209 lb (94.8 kg)  07/31/21 209 lb 8 oz (95 kg)  06/23/21 209 lb (94.8 kg)       Physical exam: Gen: alert, NAD, not ill appearing Pulm: speaks in complete sentences without increased work of breathing Psych: normal mood, normal thought content      Results for orders placed or performed during the hospital encounter of 08/11/21  Resp Panel by RT-PCR (Flu A&B, Covid) Nasopharyngeal Swab   Specimen: Nasopharyngeal Swab; Nasopharyngeal(NP) swabs in vial transport medium  Result Value Ref Range   SARS Coronavirus 2 by RT PCR NEGATIVE NEGATIVE   Influenza A by PCR NEGATIVE NEGATIVE   Influenza B by PCR NEGATIVE NEGATIVE  Comprehensive metabolic panel  Result Value Ref Range   Sodium 139 135 - 145 mmol/L   Potassium 3.6 3.5 - 5.1 mmol/L   Chloride 106 98 - 111 mmol/L   CO2 23 22 - 32 mmol/L   Glucose, Bld 120 (H) 70 - 99 mg/dL   BUN 10 8 - 23 mg/dL   Creatinine, Ser 0.73 0.44 - 1.00 mg/dL   Calcium 9.2 8.9 - 10.3 mg/dL   Total Protein 6.4 (L) 6.5 - 8.1 g/dL   Albumin 3.4 (L) 3.5 - 5.0 g/dL   AST 18 15 - 41 U/L   ALT 15 0 - 44 U/L   Alkaline Phosphatase 156 (H) 38 - 126 U/L   Total Bilirubin 0.5 0.3 - 1.2 mg/dL   GFR, Estimated >60 >60 mL/min   Anion gap 10 5 - 15  Ethanol  Result Value Ref Range   Alcohol, Ethyl (B) <10 <10 mg/dL  Acetaminophen level  Result Value Ref Range   Acetaminophen (Tylenol), Serum <10 (L) 10 - 30 ug/mL  Salicylate level  Result Value Ref Range   Salicylate Lvl <1.9 (L) 7.0 - 30.0 mg/dL  Rapid urine drug screen (hospital performed)  Result Value Ref Range   Opiates NONE DETECTED NONE DETECTED   Cocaine NONE DETECTED NONE DETECTED   Benzodiazepines POSITIVE (A) NONE DETECTED    Amphetamines NONE DETECTED NONE DETECTED   Tetrahydrocannabinol NONE DETECTED NONE DETECTED   Barbiturates NONE DETECTED NONE DETECTED  Urinalysis, Routine w reflex microscopic Urine, Catheterized  Result Value Ref Range   Color, Urine YELLOW YELLOW   APPearance CLEAR CLEAR   Specific Gravity, Urine 1.006 1.005 - 1.030   pH 6.0 5.0 - 8.0   Glucose, UA NEGATIVE NEGATIVE mg/dL   Hgb urine dipstick NEGATIVE NEGATIVE   Bilirubin Urine NEGATIVE NEGATIVE   Ketones, ur NEGATIVE NEGATIVE mg/dL   Protein, ur NEGATIVE NEGATIVE mg/dL   Nitrite NEGATIVE NEGATIVE   Leukocytes,Ua NEGATIVE NEGATIVE  CBC  with Differential  Result Value Ref Range   WBC 7.3 4.0 - 10.5 K/uL   RBC 5.07 3.87 - 5.11 MIL/uL   Hemoglobin 13.4 12.0 - 15.0 g/dL   HCT 42.2 36.0 - 46.0 %   MCV 83.2 80.0 - 100.0 fL   MCH 26.4 26.0 - 34.0 pg   MCHC 31.8 30.0 - 36.0 g/dL   RDW 14.0 11.5 - 15.5 %   Platelets 294 150 - 400 K/uL   nRBC 0.0 0.0 - 0.2 %   Neutrophils Relative % 57 %   Neutro Abs 4.1 1.7 - 7.7 K/uL   Lymphocytes Relative 29 %   Lymphs Abs 2.1 0.7 - 4.0 K/uL   Monocytes Relative 6 %   Monocytes Absolute 0.5 0.1 - 1.0 K/uL   Eosinophils Relative 7 %   Eosinophils Absolute 0.5 0.0 - 0.5 K/uL   Basophils Relative 1 %   Basophils Absolute 0.0 0.0 - 0.1 K/uL   Immature Granulocytes 0 %   Abs Immature Granulocytes 0.02 0.00 - 0.07 K/uL  TSH  Result Value Ref Range   TSH 2.807 0.350 - 4.500 uIU/mL  T4, free  Result Value Ref Range   Free T4 0.89 0.61 - 1.12 ng/dL  Magnesium  Result Value Ref Range   Magnesium 2.0 1.7 - 2.4 mg/dL  HIV Antibody (routine testing w rflx)  Result Value Ref Range   HIV Screen 4th Generation wRfx Non Reactive Non Reactive  Comprehensive metabolic panel  Result Value Ref Range   Sodium 138 135 - 145 mmol/L   Potassium 4.2 3.5 - 5.1 mmol/L   Chloride 107 98 - 111 mmol/L   CO2 24 22 - 32 mmol/L   Glucose, Bld 124 (H) 70 - 99 mg/dL   BUN 6 (L) 8 - 23 mg/dL   Creatinine, Ser  0.74 0.44 - 1.00 mg/dL   Calcium 9.3 8.9 - 10.3 mg/dL   Total Protein 6.4 (L) 6.5 - 8.1 g/dL   Albumin 3.2 (L) 3.5 - 5.0 g/dL   AST 16 15 - 41 U/L   ALT 14 0 - 44 U/L   Alkaline Phosphatase 162 (H) 38 - 126 U/L   Total Bilirubin 0.7 0.3 - 1.2 mg/dL   GFR, Estimated >60 >60 mL/min   Anion gap 7 5 - 15  CBG monitoring, ED  Result Value Ref Range   Glucose-Capillary 116 (H) 70 - 99 mg/dL  Troponin I (High Sensitivity)  Result Value Ref Range   Troponin I (High Sensitivity) 5 <18 ng/L  Troponin I (High Sensitivity)  Result Value Ref Range   Troponin I (High Sensitivity) 6 <18 ng/L   Assessment & Plan:   Problem List Items Addressed This Visit       Genitourinary   Overactive bladder    Chronic.  Progressing.  Discussed options for treatment, she prefers to start with medication.  Prescription for oxybutynin XL 5 mg sent to pharmacy.  Follow-up in 1 month.      Relevant Medications   oxybutynin (DITROPAN-XL) 5 MG 24 hr tablet     Other   Anxiety and depression - Primary    Deteriorated.  Will add in 30 mg of duloxetine to her already existing 60 mg dose.  Discussed to take both 60 mg and 30 mg capsule.  We will see her back in 1 month.  Hospital notes reviewed from hospital stay in August 2022 for overdose.  We do not have the [facility] notes  Relevant Medications   DULoxetine (CYMBALTA) 30 MG capsule   Other Relevant Orders   Ambulatory referral to Psychology   Sleep disturbance    Chronic and ongoing.  Agree to move forward with a sleep study. Trazodone is ineffective, will discontinue.  Increase duloxetine to 90 mg for improvement of anxiety/depression.  Will follow back up in 1 month.        Meds ordered this encounter  Medications   DULoxetine (CYMBALTA) 30 MG capsule    Sig: Take 1 capsule (30 mg total) by mouth daily. For anxiety and depression. Take with 60 mg capsule.    Dispense:  90 capsule    Refill:  0    Order Specific Question:    Supervising Provider    Answer:   BEDSOLE, AMY E [[id number]]   oxybutynin (DITROPAN-XL) 5 MG 24 hr tablet    Sig: Take 1 tablet (5 mg total) by mouth at bedtime. For overactive bladder.    Dispense:  30 tablet    Refill:  0    Order Specific Question:   Supervising Provider    Answer:   Diona Browner, AMY E [[id number]]   Orders Placed This Encounter  Procedures   Ambulatory referral to Psychology    Referral Priority:   Routine    Referral Type:   Psychiatric    Referral Reason:   Specialty Services Required    Requested Specialty:   Psychology    Number of Visits Requested:   1    I discussed the assessment and treatment plan with the patient. The patient was provided an opportunity to ask questions and all were answered. The patient agreed with the plan and demonstrated an understanding of the instructions. The patient was advised to call back or seek an in-person evaluation if the symptoms worsen or if the condition fails to improve as anticipated.  Follow up plan:  Start oxybutynin XL 5 mg once daily for overactive bladder.  Start duloxetine 30 mg once daily, add this to your 60 mg dose of duloxetine.  This is for anxiety and depression.  Please schedule follow-up visit for 1 month as discussed.  Pleas Koch, NP

## 2021-09-26 NOTE — Assessment & Plan Note (Signed)
Chronic.  Progressing.  Discussed options for treatment, she prefers to start with medication.  Prescription for oxybutynin XL 5 mg sent to pharmacy.  Follow-up in 1 month.

## 2021-09-26 NOTE — Assessment & Plan Note (Signed)
Chronic and ongoing.  Agree to move forward with a sleep study. Trazodone is ineffective, will discontinue.  Increase duloxetine to 90 mg for improvement of anxiety/depression.  Will follow back up in 1 month.

## 2021-10-21 ENCOUNTER — Other Ambulatory Visit: Payer: Self-pay | Admitting: Primary Care

## 2021-10-21 DIAGNOSIS — N3281 Overactive bladder: Secondary | ICD-10-CM

## 2021-10-26 ENCOUNTER — Telehealth: Payer: Self-pay | Admitting: Primary Care

## 2021-10-26 NOTE — Chronic Care Management (AMB) (Signed)
  Chronic Care Management   Outreach Note  10/26/2021 Name: Denise [name] MRN: [id number] DOB: [birth year]  Referred by: Pleas Koch, NP Reason for referral : No chief complaint on file.   An unsuccessful telephone outreach was attempted today. The patient was referred to the pharmacist for assistance with care management and care coordination.   Follow Up Plan:   Tatjana Dellinger Upstream Scheduler

## 2021-10-31 ENCOUNTER — Telehealth: Payer: Self-pay | Admitting: Primary Care

## 2021-10-31 NOTE — Chronic Care Management (AMB) (Signed)
  Chronic Care Management   Outreach Note  10/31/2021 Name: Denise [name] MRN: [id number] DOB: Apr 26, 1951  Referred by: Pleas Koch, NP Reason for referral : No chief complaint on file.   A second unsuccessful telephone outreach was attempted today. The patient was referred to pharmacist for assistance with care management and care coordination.  Follow Up Plan:   Tatjana Dellinger Upstream Scheduler

## 2021-11-06 ENCOUNTER — Telehealth: Payer: Self-pay | Admitting: Primary Care

## 2021-11-06 NOTE — Chronic Care Management (AMB) (Signed)
  Chronic Care Management   Note  11/06/2021 Name: Iriana Artley MRN: [id number] DOB: 06-Nov-1951  Gailen Shelter Dorow is a 70 y.o. year old female who is a primary care patient of Pleas Koch, NP. I reached out to [name] by phone today in response to a referral sent by Ms. Gailen Shelter Latka's PCP, Pleas Koch, NP.   Ms. Loney was given information about Chronic Care Management services today including:  CCM service includes personalized support from designated clinical staff supervised by her physician, including individualized plan of care and coordination with other care providers 24/7 contact phone numbers for assistance for urgent and routine care needs. Service will only be billed when office clinical staff spend 20 minutes or more in a month to coordinate care. Only one practitioner may furnish and bill the service in a calendar month. The patient may stop CCM services at any time (effective at the end of the month) by phone call to the office staff.   Patient agreed to services and verbal consent obtained.   Follow up plan:   Tatjana Secretary/administrator

## 2021-11-14 ENCOUNTER — Telehealth: Payer: Self-pay | Admitting: *Deleted

## 2021-11-14 ENCOUNTER — Encounter: Payer: Self-pay | Admitting: Neurology

## 2021-11-14 ENCOUNTER — Institutional Professional Consult (permissible substitution): Payer: Medicare Other | Admitting: Neurology

## 2021-11-14 NOTE — Telephone Encounter (Signed)
Pt no showed for her appt today.  NS X 1.

## 2021-12-12 ENCOUNTER — Other Ambulatory Visit: Payer: Self-pay | Admitting: Primary Care

## 2021-12-12 DIAGNOSIS — F419 Anxiety disorder, unspecified: Secondary | ICD-10-CM

## 2021-12-12 NOTE — Telephone Encounter (Signed)
Patient needs to be seen for follow up. I instructed her to schedule this via MyChart over one month ago and she has not.  Must be seen for further refills.

## 2021-12-13 NOTE — Telephone Encounter (Signed)
Please schedule follow up Anxiety/Depression appointment with Denise [name].  She needs an appointment prior to any additional refills.

## 2021-12-15 NOTE — Telephone Encounter (Signed)
LVM with pt to call back to schedule appt.

## 2021-12-19 NOTE — Telephone Encounter (Signed)
2nd attempt LMTCB to schedule

## 2021-12-20 ENCOUNTER — Telehealth: Payer: Self-pay

## 2021-12-20 NOTE — Progress Notes (Signed)
Chronic Care Management Pharmacy Assistant   Name: Denise [name]  MRN: [id number] DOB: December 01, 1951  Reason for Encounter: CCM (Initial Questions)   Recent office visits:  09/26/2021 - Alma Friendly, NP - Patient presented for anxiety and depression. Referral to Psychology. Start: oxybutynin (DITROPAN-XL) 5 MG 24 hr tablet. Change: DULoxetine (CYMBALTA) 30 mg capsule added to her existing 60 MG capsule with a total of 90 mg daily. Stop by provider: traZODone (DESYREL) 50 MG tablet.  06/23/2021 - Alma Friendly, NP - Patient presented for annual exam. Labs: CMP, Lipid, A1c, LDL, TSH. Ordered: DG Bone Density and Mammogram. Referral to Neurology. Start: rizatriptan (MAXALT) 5 MG tablet. Start: Zoster Vaccine Adjuvanted [facility]) injection. Change: DULoxetine (CYMBALTA) 60 MG capsule - TAKE 1 CAPSULE BY MOUTH DAILY FOR HEADACHE PREVENTION vs. Take 1 capsule (30 mg total) by mouth daily. For anxiety, depression, headache prevention.   Recent consult visits:  07/31/2021 - Waunita Schooner, MD - Family Medicine - Patient presented for breast pain. Ordered: MM Diag Breast TOMO Bilateral and US breast LTD Uni Left Inc Axilla.  06/26/2021 - Jerrell Belfast - Otolaryngology - Patient presented for chronic pansinusitis. No other information.  06/26/2021 - Kellie Simmering - Radiology - Patient presented for deviated nasal septum, chronic sinusitis and cyst and mucocele of nose and nasal sinus. Procedure: CT Scan Maxillofacial area.   Hospital visits:  Medication Reconciliation was completed by comparing discharge summary, patients EMR and Pharmacy list, and upon discussion with patient.  Admitted to the hospital on 08/11/2021 due to overdose of benzodiazepine. Discharge date was 08/15/2021. Discharged from [place]?Medications Started at [facility] Discharge:?? -started traZODone (DESYREL) 50 MG tablet  Medications Discontinued at Hospital Discharge: -Stopped - Hydroxyzine 10 mg  tablet  Medications that remain the same after Hospital Discharge:??  -All other medications will remain the same.    Medications: Outpatient Encounter Medications as of 12/20/2021  Medication Sig   amLODipine (NORVASC) 10 MG tablet TAKE 1 TABLET BY MOUTH  DAILY FOR BLOOD PRESSURE (Patient taking differently: Take 10 mg by mouth daily.)   aspirin 81 MG tablet Take 81 mg by mouth daily.   atorvastatin (LIPITOR) 10 MG tablet TAKE 1 TABLET BY MOUTH  DAILY FOR CHOLESTEROL   DULoxetine (CYMBALTA) 30 MG capsule Take 1 capsule (30 mg total) by mouth daily. For anxiety and depression. Take with 60 mg capsule. Office visit required for further refills.   DULoxetine (CYMBALTA) 60 MG capsule TAKE 1 CAPSULE BY MOUTH  DAILY FOR HEADACHE  PREVENTION   esomeprazole (NEXIUM) 20 MG capsule Take 1 capsule (20 mg total) by mouth every morning.   levothyroxine (SYNTHROID) 75 MCG tablet TAKE 1 TABLET BY MOUTH IN  THE MORNING ON AN EMPTY  STOMACH WITH WATER ONLY. NO FOOD OR OTHER MEDICATIONS  FOR 1/2 HOUR   metoprolol succinate (TOPROL-XL) 25 MG 24 hr tablet TAKE 1 TABLET BY MOUTH  DAILY FOR BLOOD PRESSURE   oxybutynin (DITROPAN-XL) 5 MG 24 hr tablet Take 1 tablet (5 mg total) by mouth at bedtime. For overactive bladder.   rizatriptan (MAXALT) 5 MG tablet Take 1 tablet by mouth at migraine onset. May repeat with 1 tablet in 2 hours if needed. (Patient taking differently: Take 5 mg by mouth daily as needed for migraine.)   No facility-administered encounter medications on file as of 12/20/2021.   Lab Results  Component Value Date/Time   HGBA1C 6.1 06/23/2021 12:45 PM   HGBA1C 6.3 05/30/2020 12:40 PM  BP Readings from Last 3 Encounters:  09/26/21 (!) 158/92  08/15/21 111/84  07/31/21 120/76   Unsuccessful attempt to reach patient. Left patient message to have all medications, supplements and any blood glucose and blood pressure readings available for review at appointment.    Star Rating Drugs:   Medication:  Last Fill: Day Supply Atorvastatin 10 mg 12/12/2021 90   Care Gaps: Annual wellness visit in last year? No Most Recent BP reading: 158/92 on 09/26/2021  Charlene Brooke, CPP notified  Marijean Niemann, [place] Clinical Pharmacy Assistant [phone]

## 2021-12-21 NOTE — Telephone Encounter (Signed)
Please send MyChart message and letter.

## 2021-12-25 ENCOUNTER — Encounter: Payer: Self-pay | Admitting: Primary Care

## 2021-12-25 ENCOUNTER — Other Ambulatory Visit: Payer: Self-pay

## 2021-12-25 ENCOUNTER — Ambulatory Visit (INDEPENDENT_AMBULATORY_CARE_PROVIDER_SITE_OTHER): Payer: Medicare Other | Admitting: Pharmacist

## 2021-12-25 DIAGNOSIS — G43009 Migraine without aura, not intractable, without status migrainosus: Secondary | ICD-10-CM

## 2021-12-25 DIAGNOSIS — E785 Hyperlipidemia, unspecified: Secondary | ICD-10-CM

## 2021-12-25 DIAGNOSIS — I10 Essential (primary) hypertension: Secondary | ICD-10-CM

## 2021-12-25 DIAGNOSIS — K219 Gastro-esophageal reflux disease without esophagitis: Secondary | ICD-10-CM

## 2021-12-25 DIAGNOSIS — R7303 Prediabetes: Secondary | ICD-10-CM

## 2021-12-25 DIAGNOSIS — E890 Postprocedural hypothyroidism: Secondary | ICD-10-CM

## 2021-12-25 DIAGNOSIS — F419 Anxiety disorder, unspecified: Secondary | ICD-10-CM

## 2021-12-25 NOTE — Telephone Encounter (Signed)
My chart message sent today

## 2021-12-25 NOTE — Progress Notes (Signed)
Chronic Care Management Pharmacy Note  12/28/2021 Name:  Denise [name] MRN:  [id number] DOB:  1951/01/02  Summary: -Pt endorses compliance with meds as prescribed; she has recently made lifestyle changes due to prediabetes diagnosis -Pt has hx of osteopenia and has repeat DEXA this month, she is not taking calcium or vitamin D -Pt is taking OTC Nexium twice daily and reports GERD symptoms are not bad. Discussed bone density risks with high dose PPI.  Recommendations/Changes made from today's visit: -Counseled on plate method / which foods are carbs -Advised to start calcium/Vitamin D supplement (goal Ca 1200 mg/day, Vit D 1000 IU/day) -Advised to reduce OTC Nexium to once daily, may use Pepcid for breakthrough symptoms  Plan: -[place] will call patient 3 months for general adherence -Pharmacist follow up televisit scheduled for 6 months    Subjective: Denise [name] is an 71 y.o. year old female who is a primary patient of Pleas Koch, NP.  The CCM team was consulted for assistance with disease management and care coordination needs.    Engaged with patient by telephone for initial visit in response to provider referral for pharmacy case management and/or care coordination services.   Consent to Services:  The patient was given the following information about Chronic Care Management services today, agreed to services, and gave verbal consent: 1. CCM service includes personalized support from designated clinical staff supervised by the primary care provider, including individualized plan of care and coordination with other care providers 2. 24/7 contact phone numbers for assistance for urgent and routine care needs. 3. Service will only be billed when office clinical staff spend 20 minutes or more in a month to coordinate care. 4. Only one practitioner may furnish and bill the service in a calendar month. 5.The patient may stop CCM services at any time  (effective at the end of the month) by phone call to the office staff. 6. The patient will be responsible for cost sharing (co-pay) of up to 20% of the service fee (after annual deductible is met). Patient agreed to services and consent obtained.  Patient Care Team: Pleas Koch, NP as PCP - General (Internal Medicine) Susa Day, MD as Consulting Physician (Orthopedic Surgery) Charlton Haws, [facility] as Pharmacist (Pharmacist)  Recent office visits: 09/26/2021 - Alma Friendly, NP VV- Patient presented for anxiety and depression. Referral to Psychology. Start: oxybutynin (DITROPAN-XL) 5 MG 24 hr tablet. Change: DULoxetine (CYMBALTA) 30 mg capsule added to her existing 60 MG capsule with a total of 90 mg daily. Stop by provider: traZODone (DESYREL) 50 MG tablet.   06/23/2021 - Alma Friendly, NP - Patient presented for annual exam. Labs: CMP, Lipid, A1c, LDL, TSH. Ordered: DG Bone Density and Mammogram. Referral to Neurology (sleep disturbance). Start: rizatriptan (MAXALT) 5 MG tablet. Start: Zoster Vaccine Adjuvanted [facility]) injection. Increase duloxetine to 60 mg  Recent consult visits: 07/31/2021 - Waunita Schooner, MD - Family Medicine - Patient presented for breast pain. Ordered: MM Diag Breast TOMO Bilateral and US breast LTD Uni Left Inc Axilla.  06/26/2021 - Jerrell Belfast - Otolaryngology - Patient presented for chronic pansinusitis. No other information.  06/26/2021 - Kellie Simmering - Radiology - Patient presented for deviated nasal septum, chronic sinusitis and cyst and mucocele of nose and nasal sinus. Procedure: CT Scan Maxillofacial area.  Hospital visits: Medication Reconciliation was completed by comparing discharge summary, patients EMR and Pharmacy list, and upon discussion with patient.   Admitted to the hospital on 08/11/2021 due  to overdose of benzodiazepine. Discharge date was 08/15/2021. Discharged from [facility].   -pt admitted for suicide attempt  (10-15 tablets alprazolam); transferred to inpatient psych   New?Medications Started at [facility] Discharge:?? -started traZODone (DESYREL) 50 MG tablet   Medications Discontinued at Hospital Discharge: -Stopped - Hydroxyzine 10 mg tablet   Medications that remain the same after Hospital Discharge:??  -All other medications will remain the same.    Objective:  Lab Results  Component Value Date   CREATININE 0.74 08/12/2021   BUN 6 (L) 08/12/2021   GFR 79.66 06/23/2021   GFRNONAA >60 08/12/2021   GFRAA >60 01/28/2019   NA 138 08/12/2021   K 4.2 08/12/2021   CALCIUM 9.3 08/12/2021   CO2 24 08/12/2021   GLUCOSE 124 (H) 08/12/2021    Lab Results  Component Value Date/Time   HGBA1C 6.1 06/23/2021 12:45 PM   HGBA1C 6.3 05/30/2020 12:40 PM   GFR 79.66 06/23/2021 12:45 PM   GFR 72.26 01/25/2020 02:55 PM    Last diabetic Eye exam: No results found for: HMDIABEYEEXA  Last diabetic Foot exam: No results found for: HMDIABFOOTEX   Lab Results  Component Value Date   CHOL 145 06/23/2021   HDL 43.30 06/23/2021   LDLCALC 13 02/02/2019   LDLDIRECT 72.0 06/23/2021   TRIG 211.0 (H) 06/23/2021   CHOLHDL 3 06/23/2021    Hepatic Function Latest Ref Rng & Units 08/12/2021 08/11/2021 06/23/2021  Total Protein 6.5 - 8.1 g/dL 6.4(L) 6.4(L) 7.0  Albumin 3.5 - 5.0 g/dL 3.2(L) 3.4(L) 4.2  AST 15 - 41 U/L 16 18 14   ALT 0 - 44 U/L 14 15 11   Alk Phosphatase 38 - 126 U/L 162(H) 156(H) 218(H)  Total Bilirubin 0.3 - 1.2 mg/dL 0.7 0.5 0.3  Bilirubin, Direct 0.0 - 0.3 mg/dL - - -    Lab Results  Component Value Date/Time   TSH 2.807 08/11/2021 09:26 AM   TSH 2.23 06/23/2021 12:45 PM   TSH 2.01 05/30/2020 12:40 PM   FREET4 0.89 08/11/2021 09:22 AM   FREET4 1.28 08/15/2015 02:10 PM    CBC Latest Ref Rng & Units 08/11/2021 05/30/2020 01/25/2020  WBC 4.0 - 10.5 K/uL 7.3 7.7 11.1(H)  Hemoglobin 12.0 - 15.0 g/dL 13.4 12.7 14.5  Hematocrit 36.0 - 46.0 % 42.2 38.3 44.0  Platelets 150 - 400 K/uL 294  301.0 318.0    No results found for: VD25OH  Clinical ASCVD: No  The 10-year ASCVD risk score (Arnett DK, et al., 2019) is: 17.8%   Values used to calculate the score:     Age: 5 years     Sex: Female     Is Non-Hispanic African American: No     Diabetic: No     Tobacco smoker: No     Systolic Blood Pressure: 595 mmHg     Is BP treated: Yes     HDL Cholesterol: 43.3 mg/dL     Total Cholesterol: 145 mg/dL    Depression screen [facility] 2/9 06/01/2021 02/25/2017  Decreased Interest 1 0  Down, Depressed, Hopeless 1 0  PHQ - 2 Score 2 0  Altered sleeping 3 -  Tired, decreased energy 2 -  Change in appetite 2 -  Feeling bad or failure about yourself  2 -  Trouble concentrating 2 -  Moving slowly or fidgety/restless 1 -  Suicidal thoughts 0 -  PHQ-9 Score 14 -  Difficult doing work/chores Somewhat difficult -    Social History   Tobacco Use  Smoking Status Never  Smokeless Tobacco Never   BP Readings from Last 3 Encounters:  09/26/21 (!) 158/92  08/15/21 111/84  07/31/21 120/76   Pulse Readings from Last 3 Encounters:  09/26/21 77  08/15/21 88  07/31/21 83   Wt Readings from Last 3 Encounters:  09/26/21 209 lb (94.8 kg)  07/31/21 209 lb 8 oz (95 kg)  06/23/21 209 lb (94.8 kg)   BMI Readings from Last 3 Encounters:  09/26/21 35.32 kg/m  07/31/21 35.41 kg/m  06/23/21 35.32 kg/m    Assessment/Interventions: Review of patient past medical history, allergies, medications, health status, including review of consultants reports, laboratory and other test data, was performed as part of comprehensive evaluation and provision of chronic care management services.   SDOH:  (Social Determinants of Health) assessments and interventions performed: Yes  SDOH Screenings   Alcohol Screen: Not on file  Depression (PHQ2-9): Medium Risk   PHQ-2 Score: 14  Financial Resource Strain: Not on file  Food Insecurity: Not on file  Housing: Not on file  Physical Activity: Not on file   Social Connections: Not on file  Stress: Not on file  Tobacco Use: Low Risk    Smoking Tobacco Use: Never   Smokeless Tobacco Use: Never   Passive Exposure: Not on file  Transportation Needs: Not on file    [place]  Allergies  Allergen Reactions   Tramadol Other (See Comments)    Reaction: NIGHTMARES    Medications Reviewed Today     Reviewed by Charlton Haws, [facility] (Pharmacist) on 12/25/21 at [place] List Status: <None>   Medication Order Taking? Sig Documenting Provider Last Dose Status Informant  amLODipine (NORVASC) 10 MG tablet [id number] Yes TAKE 1 TABLET BY MOUTH  DAILY FOR BLOOD PRESSURE  Patient taking differently: Take 10 mg by mouth daily.   Pleas Koch, NP Taking Active Self  aspirin 81 MG tablet [id number] Yes Take 81 mg by mouth daily. [provider] Taking Active Self  atorvastatin (LIPITOR) 10 MG tablet [id number] Yes TAKE 1 TABLET BY MOUTH  DAILY FOR CHOLESTEROL Pleas Koch, NP Taking Active   DULoxetine (CYMBALTA) 30 MG capsule [id number] Yes Take 1 capsule (30 mg total) by mouth daily. For anxiety and depression. Take with 60 mg capsule. Office visit required for further refills. Pleas Koch, NP Taking Active   DULoxetine (CYMBALTA) 60 MG capsule [id number] Yes TAKE 1 CAPSULE BY MOUTH  DAILY FOR HEADACHE  PREVENTION Pleas Koch, NP Taking Active Self  esomeprazole (NEXIUM) 20 MG capsule [id number] Yes Take 1 capsule (20 mg total) by mouth every morning. [place], Amy S, PA-C Taking Active Self  levothyroxine (SYNTHROID) 75 MCG tablet [id number] Yes TAKE 1 TABLET BY MOUTH IN  THE MORNING ON AN EMPTY  STOMACH WITH WATER ONLY. NO FOOD OR OTHER MEDICATIONS  FOR 1/2 HOUR Pleas Koch, NP Taking Active   metoprolol succinate (TOPROL-XL) 25 MG 24 hr tablet [id number] Yes TAKE 1 TABLET BY MOUTH  DAILY FOR BLOOD PRESSURE Pleas Koch, NP Taking Active   Patient not taking:  Discontinued 12/25/21 1507 (Patient  Preference)   rizatriptan (MAXALT) 5 MG tablet [id number] Yes Take 1 tablet by mouth at migraine onset. May repeat with 1 tablet in 2 hours if needed.  Patient taking differently: Take 5 mg by mouth daily as needed for migraine.   Pleas Koch, NP Taking Active Self            Patient  Active Problem List   Diagnosis Date Noted   Overactive bladder 09/26/2021   Overdose of benzodiazepine 08/11/2021   Benzodiazepine intoxication ([place])    Neck pain on right side 07/31/2021   Preventative health care 06/23/2021   Sleep disturbance 06/01/2021   Anxiety and depression 11/02/2020   Dysuria 11/02/2020   Lumbosacral spondylosis with radiculopathy 10/20/2020   Breast pain, left 04/18/2020   Elevated alkaline phosphatase level 02/11/2020   Diverticulosis of colon without hemorrhage 01/21/2020   Hx of adenomatous colonic polyps 01/21/2020   Left lower quadrant abdominal pain 10/22/2019   Excessive sweating 10/13/2019   Prediabetes 10/13/2019   Hyperlipidemia 02/02/2019   Colitis 01/26/2019   Chest pain 02/25/2017   Welcome to Medicare preventive visit 08/25/2015   Essential hypertension 08/25/2015   Pituitary adenoma ([place]) 05/12/2014   GERD (gastroesophageal reflux disease) 09/01/2012   Hypothyroidism    Osteopenia    Migraine    Fibroid    HSV-1 (herpes simplex virus 1) infection     Immunization History  Administered Date(s) Administered   Fluad Quad(high Dose 65+) 10/13/2019, 01/17/2021   Influenza,inj,Quad PF,6+ Mos 02/02/2019   Influenza,inj,quad, With Preservative 12/31/2018   Moderna Sars-Covid-2 Vaccination 01/21/2020, 02/19/2020   Pneumococcal Conjugate-13 02/25/2017   Pneumococcal Polysaccharide-23 06/23/2021    Conditions to be addressed/monitored:  Hypertension, Hyperlipidemia, GERD, Hypothyroidism, Depression, Anxiety, and Overactive Bladder, Prediabetes  Care Plan : [place]  Updates made by Charlton Haws, [place] since 12/28/2021 12:00  AM     Problem: Hypertension, Hyperlipidemia, GERD, Hypothyroidism, Depression, Anxiety, and Overactive Bladder   Priority: High     Long-Range Goal: Disease mgmt   Start Date: 12/28/2021  Expected End Date: 12/28/2022  This Visit's Progress: On track  Priority: High  Note:   Current Barriers:  Unable to independently monitor therapeutic efficacy  Pharmacist Clinical Goal(s):  Patient will achieve adherence to monitoring guidelines and medication adherence to achieve therapeutic efficacy through collaboration with PharmD and provider.   Interventions: 1:1 collaboration with Pleas Koch, NP regarding development and update of comprehensive plan of care as evidenced by provider attestation and co-signature Inter-disciplinary care team collaboration (see longitudinal plan of care) Comprehensive medication review performed; medication list updated in electronic medical record  Hypertension (BP goal <140/90) -Controlled - working on wt loss for about 6 weeks, using GoLo, walking, diet changes. She has lost 5 lbs. -Current home readings: "normal" -Current treatment: Amlodipine 10 mg daily - Appropriate, Effective, Safe, Accessible Metoprolol succinate 25 mg daily - Appropriate (given hx of migraines), Effective, Safe, Accessible -Medications previously tried: lisinopril, losartan  -Denies hypotensive/hypertensive symptoms -Educated on BP goals and benefits of medications for prevention of heart attack, stroke and kidney damage; Daily salt intake goal < 2300 mg; Exercise goal of 150 minutes per week; Importance of home blood pressure monitoring; -Counseled to monitor BP at home periodically -Recommended to continue current medication  Hyperlipidemia: (LDL goal < 100) -Controlled - LDL is at goal; pt endorses compliance with statin and aspirin -Hx TIA -Current treatment: Atorvastatin 10 mg daily - Appropriate, Effective, Safe, Accessible Aspirin 81 mg daily - Appropriate (given  hx TIA), Effective, Safe, Accessible -Educated on Cholesterol goals; Benefits of statin for ASCVD risk reduction; -Recommended to continue current medication  Depression/Anxiety (Goal: manage symptoms) -Controlled - pt reports mood is currently stable -PHQ9 - 14 (05/2021) - moderate depression -GAD7 - 12 (05/2021) - moderate anxiety -hx overdose of benzodiazepine, ? Suicide attempt 07/2021 -Current treatment: Duloxetine 30 mg daily -Appropriate,  Effective, Safe, Accessible Duloxetine 60 mg daily - Appropriate, Effective, Safe, Accessible -Medications previously tried/failed: venlafaxine -Educated on Benefits of medication for symptom control; Benefits of cognitive-behavioral therapy with or without medication -Recommended to continue current medication  Insomnia (Goal: improve sleep) -Not ideally controlled - pt states she goes to bed at 12, sleeps 2-3 hrs before waking up again; no TV in bedroom, looks at [place] on her phone when she wakes up -Pt is planning to start pilates -Current treatment  None -Medications previously tried: trazodone, melatonin  -Discussed sleep hygiene at length, including importance of bedtime routine and avoiding screens/artificial light for 30-60 min before bed; discussed routine exercise can help modulate sleep patterns as well -Discussed risks of hypnotics like Ambien and lack of quality sleep improvement with these; could consider Belsomra, Rozerem in future if needed  Migraine (Goal: reduce frequency) -Controlled -pt reports migraines have improved significantly since retiring -Current treatment  Duloxetine 90 mg/day - Appropriate, Effective, Safe, Accessible Metoprolol succinate 25 mg daily - Appropriate, Effective, Safe, Accessible Rizatriptan 5 mg PRN - Appropriate, Effective, Query Safe -Given hx of patient-reported TIA triptans are not the safest option, however pt reports she hardly every uses this anymore -Discussed benefits of beta blockers and  SNRI for migraine prevention -Recommended to continue current medication  OAB (Goal: reduce urinary frequency) -Controlled - not using oxybutynin, does not endorse urinary issues -Current treatment  Oxybutynin XL 5 mg daily HS - removed from list -Continue to monitor  Osteopenia (Goal prevent fractures) -Not ideally controlled - pt is not taking ca/vitamin D; she has DEXA scheduled 12/27/21 -Last DEXA Scan: 12/12/2010   T-Score femoral neck: -1.4  T-Score total hip: -0.6  T-Score lumbar spine: -1.2  10-year probability of major osteoporotic fracture: 6.7%  10-year probability of hip fracture: 0.4% -Patient is not a candidate for pharmacologic treatment -Current treatment  None -Recommend [phone] units of vitamin D daily. Recommend 1200 mg of calcium daily from dietary and supplemental sources.  Hypothyroidism (Goal: maintain TSH in goal range) -Controlled - [facility] at [facility] -Current treatment  Levothyroxine 75 mcg daily - Appropriate, Effective, Safe, Accessible -Recommended to continue current medication; advised not to change timing  GERD (Goal: manage symptoms) -Controlled - taking PPI BID; has taken Pepcid occasionally;  -Hx hiatal hernia -Current treatment  Esomeprazole 20 mg daily - Appropriate, Effective, Query Safe -discussed long term risks of PPI use including bone density issues;  -Advised to reduce esomeprazole to once daily; can use Pepcid PRN to compensate  Prediabetes (Goal: prevent progression to DM) -Controlled - pt reports this is bigger driver to make lifestyle changes -Educated on A1c and blood sugar goals; -Counseled on diet and exercise extensively - plate method, limiting carbs  Health Maintenance -Vaccine gaps: Covid booster, Shingrix, TDAP -Discussed vaccines, pt may get Shingrix at pharmacy  Patient Goals/Self-Care Activities Patient will:  - take medications as prescribed as evidenced by patient report and record review focus on medication  adherence by routine check blood pressure periodically, document, and provide at future appointments target a minimum of 150 minutes of moderate intensity exercise weekly engage in dietary modifications by limiting carbs      Medication Assistance: None required.  Patient affirms current coverage meets needs.  Compliance/Adherence/Medication fill history: Care Gaps: Colonoscopy (due 06/16/19)  Star-Rating Drugs: Atorvastatin - LF 12/12/21 x 90 ds ([place] 100%)  Patient's preferred pharmacy is:  [id number] [id number]: [phone]:  [phone]  [facility] ([place], [place] [facility] [address] [place] [address] Phone: [phone] Fax: [phone]  [facility] ([facility] ) - [place], [place] [address] Phone: [phone] Fax: [phone]  Uses pill box? Yes Pt endorses 100% compliance  We discussed: Current pharmacy is preferred with insurance plan and patient is satisfied with pharmacy services Patient decided to: Continue current medication management strategy  Care Plan and Follow Up Patient Decision:  Patient agrees to Care Plan and Follow-up.  Plan: Telephone follow up appointment with care management team member scheduled for:  6 months  Charlene Brooke, PharmD, BCACP Clinical Pharmacist [facility] at [facility] [phone]

## 2021-12-25 NOTE — Telephone Encounter (Signed)
Letter mailed as well.

## 2021-12-27 ENCOUNTER — Ambulatory Visit
Admission: RE | Admit: 2021-12-27 | Discharge: 2021-12-27 | Disposition: A | Discharge status: Home / Self Care | Payer: Medicare Other | Source: Ambulatory Visit | Attending: Primary Care | Admitting: Primary Care

## 2021-12-27 DIAGNOSIS — Z780 Asymptomatic menopausal state: Secondary | ICD-10-CM | POA: Diagnosis not present

## 2021-12-27 DIAGNOSIS — E2839 Other primary ovarian failure: Secondary | ICD-10-CM

## 2021-12-27 DIAGNOSIS — M810 Age-related osteoporosis without current pathological fracture: Secondary | ICD-10-CM | POA: Diagnosis not present

## 2021-12-27 DIAGNOSIS — M8589 Other specified disorders of bone density and structure, multiple sites: Secondary | ICD-10-CM | POA: Diagnosis not present

## 2021-12-28 NOTE — Patient Instructions (Addendum)
Visit Information  Phone number for Pharmacist: [phone]  Thank you for meeting with me to discuss your medications! I look forward to working with you to achieve your health care goals. Below is a summary of what we talked about during the visit:   Goals Addressed             This Visit's Progress    Eat Healthy       Timeframe:  Long-Range Goal Priority:  High Start Date:       12/25/21                      Expected End Date: 12/25/22                      Follow Up Date July 2024   - change to whole grain breads, cereal, pasta - drink 6 to 8 glasses of water each day - fill half of plate with vegetables - manage portion size - read food labels for fat, fiber, carbohydrates and portion size - switch to low-fat or skim milk - switch to sugar-free drinks    Why is this important?   When you are ready to manage your nutrition or weight, having a plan and setting goals will help.  Taking small steps to change how you eat and exercise is a good place to start.    Notes:         Care Plan : CCM Pharmacy Care Plan  Updates made by Charlton Haws, RPH since 12/28/2021 12:00 AM     Problem: Hypertension, Hyperlipidemia, GERD, Hypothyroidism, Depression, Anxiety, and Overactive Bladder   Priority: High     Long-Range Goal: Disease mgmt   Start Date: 12/28/2021  Expected End Date: 12/28/2022  This Visit's Progress: On track  Priority: High  Note:   Current Barriers:  Unable to independently monitor therapeutic efficacy  Pharmacist Clinical Goal(s):  Patient will achieve adherence to monitoring guidelines and medication adherence to achieve therapeutic efficacy through collaboration with PharmD and provider.   Interventions: 1:1 collaboration with Pleas Koch, NP regarding development and update of comprehensive plan of care as evidenced by provider attestation and co-signature Inter-disciplinary care team collaboration (see longitudinal plan of  care) Comprehensive medication review performed; medication list updated in electronic medical record  Hypertension (BP goal <140/90) -Controlled - working on wt loss for about 6 weeks, using GoLo, walking, diet changes. She has lost 5 lbs. -Current home readings: "normal" -Current treatment: Amlodipine 10 mg daily - Appropriate, Effective, Safe, Accessible Metoprolol succinate 25 mg daily - Appropriate (given hx of migraines), Effective, Safe, Accessible -Medications previously tried: lisinopril, losartan  -Denies hypotensive/hypertensive symptoms -Educated on BP goals and benefits of medications for prevention of heart attack, stroke and kidney damage; Daily salt intake goal < 2300 mg; Exercise goal of 150 minutes per week; Importance of home blood pressure monitoring; -Counseled to monitor BP at home periodically -Recommended to continue current medication  Hyperlipidemia: (LDL goal < 100) -Controlled - LDL is at goal; pt endorses compliance with statin and aspirin -Hx TIA -Current treatment: Atorvastatin 10 mg daily - Appropriate, Effective, Safe, Accessible Aspirin 81 mg daily - Appropriate (given hx TIA), Effective, Safe, Accessible -Educated on Cholesterol goals; Benefits of statin for ASCVD risk reduction; -Recommended to continue current medication  Depression/Anxiety (Goal: manage symptoms) -Controlled - pt reports mood is currently stable -PHQ9 - 14 (05/2021) - moderate depression -GAD7 - 12 (05/2021) - moderate anxiety -hx  overdose of benzodiazepine, ? Suicide attempt 07/2021 -Current treatment: Duloxetine 30 mg daily -Appropriate, Effective, Safe, Accessible Duloxetine 60 mg daily - Appropriate, Effective, Safe, Accessible -Medications previously tried/failed: venlafaxine -Educated on Benefits of medication for symptom control; Benefits of cognitive-behavioral therapy with or without medication -Recommended to continue current medication  Insomnia (Goal: improve  sleep) -Not ideally controlled - pt states she goes to bed at 12, sleeps 2-3 hrs before waking up again; no TV in bedroom, looks at [place] on her phone when she wakes up -Pt is planning to start pilates -Current treatment  None -Medications previously tried: trazodone, melatonin  -Discussed sleep hygiene at length, including importance of bedtime routine and avoiding screens/artificial light for 30-60 min before bed; discussed routine exercise can help modulate sleep patterns as well -Discussed risks of hypnotics like Ambien and lack of quality sleep improvement with these; could consider Belsomra, Rozerem in future if needed  Migraine (Goal: reduce frequency) -Controlled -pt reports migraines have improved significantly since retiring -Current treatment  Duloxetine 90 mg/day - Appropriate, Effective, Safe, Accessible Metoprolol succinate 25 mg daily - Appropriate, Effective, Safe, Accessible Rizatriptan 5 mg PRN - Appropriate, Effective, Query Safe -Given hx of patient-reported TIA triptans are not the safest option, however pt reports she hardly every uses this anymore -Discussed benefits of beta blockers and SNRI for migraine prevention -Recommended to continue current medication  OAB (Goal: reduce urinary frequency) -Controlled - not using oxybutynin, does not endorse urinary issues -Current treatment  Oxybutynin XL 5 mg daily HS - removed from list -Continue to monitor  Osteopenia (Goal prevent fractures) -Not ideally controlled - pt is not taking ca/vitamin D; she has DEXA scheduled 12/27/21 -Last DEXA Scan: 12/12/2010   T-Score femoral neck: -1.4  T-Score total hip: -0.6  T-Score lumbar spine: -1.2  10-year probability of major osteoporotic fracture: 6.7%  10-year probability of hip fracture: 0.4% -Patient is not a candidate for pharmacologic treatment -Current treatment  None -Recommend [phone] units of vitamin D daily. Recommend 1200 mg of calcium daily from dietary  and supplemental sources.  Hypothyroidism (Goal: maintain TSH in goal range) -Controlled - TSH is at [facility] -Current treatment  Levothyroxine 75 mcg daily - Appropriate, Effective, Safe, Accessible -Recommended to continue current medication; advised not to change timing  GERD (Goal: manage symptoms) -Controlled - taking PPI BID; has taken Pepcid occasionally;  -Hx hiatal hernia -Current treatment  Esomeprazole 20 mg daily - Appropriate, Effective, Query Safe -discussed long term risks of PPI use including bone density issues;  -Advised to reduce esomeprazole to once daily; can use Pepcid PRN to compensate  Prediabetes (Goal: prevent progression to DM) -Controlled - pt reports this is bigger driver to make lifestyle changes -Educated on A1c and blood sugar goals; -Counseled on diet and exercise extensively - plate method, limiting carbs  Health Maintenance -Vaccine gaps: Covid booster, Shingrix, TDAP -Discussed vaccines, pt may get Shingrix at pharmacy  Patient Goals/Self-Care Activities Patient will:  - take medications as prescribed as evidenced by patient report and record review focus on medication adherence by routine check blood pressure periodically, document, and provide at future appointments target a minimum of 150 minutes of moderate intensity exercise weekly engage in dietary modifications by limiting carbs      Ms. Shrader was given information about Chronic Care Management services today including:  CCM service includes personalized support from designated clinical staff supervised by her physician, including individualized plan of care and coordination with other care providers 24/7 contact phone numbers for  assistance for urgent and routine care needs. Standard insurance, coinsurance, copays and deductibles apply for chronic care management only during months in which we provide at least 20 minutes of these services. Most insurances cover these services at 100%,  however patients may be responsible for any copay, coinsurance and/or deductible if applicable. This service may help you avoid the need for more expensive face-to-face services. Only one practitioner may furnish and bill the service in a calendar month. The patient may stop CCM services at any time (effective at the end of the month) by phone call to the office staff.  Patient agreed to services and verbal consent obtained.   Patient verbalizes understanding of instructions and care plan provided today and agrees to view in [place]. Active MyChart status confirmed with patient.   Telephone follow up appointment with pharmacy team member scheduled for: 6 months  Charlene Brooke, PharmD, [facility] Clinical Pharmacist [facility] [phone]

## 2022-01-01 ENCOUNTER — Other Ambulatory Visit: Payer: Self-pay | Admitting: Primary Care

## 2022-01-01 DIAGNOSIS — F32A Depression, unspecified: Secondary | ICD-10-CM

## 2022-01-01 DIAGNOSIS — F419 Anxiety disorder, unspecified: Secondary | ICD-10-CM

## 2022-01-16 DIAGNOSIS — E890 Postprocedural hypothyroidism: Secondary | ICD-10-CM | POA: Diagnosis not present

## 2022-01-16 DIAGNOSIS — I10 Essential (primary) hypertension: Secondary | ICD-10-CM | POA: Diagnosis not present

## 2022-01-16 DIAGNOSIS — F32A Depression, unspecified: Secondary | ICD-10-CM

## 2022-01-16 DIAGNOSIS — E785 Hyperlipidemia, unspecified: Secondary | ICD-10-CM

## 2022-01-16 DIAGNOSIS — F419 Anxiety disorder, unspecified: Secondary | ICD-10-CM

## 2022-02-22 ENCOUNTER — Ambulatory Visit: Payer: Medicare Other | Admitting: Family Medicine

## 2022-03-07 ENCOUNTER — Other Ambulatory Visit: Payer: Self-pay

## 2022-03-07 ENCOUNTER — Encounter: Payer: Self-pay | Admitting: Family Medicine

## 2022-03-07 ENCOUNTER — Ambulatory Visit (INDEPENDENT_AMBULATORY_CARE_PROVIDER_SITE_OTHER): Payer: Medicare Other | Admitting: Family Medicine

## 2022-03-07 VITALS — BP 116/76 | HR 92 | Temp 98.0°F | Wt 214.0 lb

## 2022-03-07 DIAGNOSIS — F419 Anxiety disorder, unspecified: Secondary | ICD-10-CM

## 2022-03-07 DIAGNOSIS — F32A Depression, unspecified: Secondary | ICD-10-CM | POA: Diagnosis not present

## 2022-03-07 DIAGNOSIS — F411 Generalized anxiety disorder: Secondary | ICD-10-CM | POA: Insufficient documentation

## 2022-03-07 DIAGNOSIS — G479 Sleep disorder, unspecified: Secondary | ICD-10-CM

## 2022-03-07 DIAGNOSIS — F322 Major depressive disorder, single episode, severe without psychotic features: Secondary | ICD-10-CM | POA: Diagnosis not present

## 2022-03-07 MED ORDER — MIRTAZAPINE 7.5 MG PO TABS: 7.5000 mg | ORAL_TABLET | Freq: Every day | ORAL | 0 refills | Status: DC

## 2022-03-07 NOTE — Progress Notes (Signed)
? ?Subjective:  ? ?  ?Denise [name] is a 71 y.o. female presenting for Insomnia (Over a year ) and Anxiety (Worsening x 6 months ) ?  ? ? ?Insomnia ? ?Anxiety ?Symptoms include insomnia.  ? ? ? ?#Anxiety ?- 6 months ?- longer than that ?- pet died last month ?- has 2 daughters - one recently moved to [place] with granddaughter ?- has also not spoke with other daughter since the summer ?- hx of suicide attempt - went to [facility] in [place] ?- Currently on Duloxetine 90 mg  ?- has not done therapy recently - is looking for a middle aged woman provider  ?- hx of OD on xanax ? ?#Insomnia ?- failed trazodone, melatonin ?- currently taking benadryl sleep aid ? ?Review of Systems  ?Psychiatric/Behavioral:  The patient has insomnia.   ? ? ?Social History  ? ?Tobacco Use  ?Smoking Status Never  ?Smokeless Tobacco Never  ? ? ? ?   ?Objective:  ?  ?BP Readings from Last 3 Encounters:  ?03/07/22 116/76  ?09/26/21 (!) 158/92  ?08/15/21 111/84  ? ?Wt Readings from Last 3 Encounters:  ?03/07/22 214 lb (97.1 kg)  ?09/26/21 209 lb (94.8 kg)  ?07/31/21 209 lb 8 oz (95 kg)  ? ? ?BP 116/76   Pulse 92   Temp 98 ?F (36.7 ?C) (Oral)   Wt 214 lb (97.1 kg)   SpO2 98%   BMI 36.17 kg/m?  ? ? ?Physical Exam ?Constitutional:   ?   General: She is not in acute distress. ?   Appearance: She is well-developed. She is not diaphoretic.  ?HENT:  ?   Right Ear: External ear normal.  ?   Left Ear: External ear normal.  ?   Nose: Nose normal.  ?Eyes:  ?   Conjunctiva/sclera: Conjunctivae normal.  ?Cardiovascular:  ?   Rate and Rhythm: Normal rate.  ?Pulmonary:  ?   Effort: Pulmonary effort is normal.  ?Musculoskeletal:  ?   Cervical back: Neck supple.  ?Skin: ?   General: Skin is warm and dry.  ?   Capillary Refill: Capillary refill takes less than 2 seconds.  ?Neurological:  ?   Mental Status: She is alert. Mental status is at baseline.  ?Psychiatric:     ?   Attention and Perception: Attention normal.     ?   Mood and Affect: Mood is  depressed.     ?   Speech: Speech normal.     ?   Behavior: Behavior normal.  ? ? ? ?  03/07/2022  ?  9:54 AM 06/01/2021  ? 12:55 PM 02/25/2017  ? 11:37 AM  ?Depression screen PHQ 2/9  ?Decreased Interest 3 1 0  ?Down, Depressed, Hopeless 3 1 0  ?PHQ - 2 Score 6 2 0  ?Altered sleeping 3 3   ?Tired, decreased energy 3 2   ?Change in appetite 3 2   ?Feeling bad or failure about yourself  3 2   ?Trouble concentrating 3 2   ?Moving slowly or fidgety/restless 2 1   ?Suicidal thoughts 0 0   ?PHQ-9 Score 23 14   ?Difficult doing work/chores Somewhat difficult Somewhat difficult   ? ? ?  03/07/2022  ?  9:55 AM 06/01/2021  ? 12:55 PM  ?GAD 7 : Generalized Anxiety Score  ?Nervous, Anxious, on Edge 3 3  ?Control/stop worrying 3 2  ?Worry too much - different things 3 2  ?Trouble relaxing 2 2  ?Restless 2 1  ?  Easily annoyed or irritable 2 1  ?Afraid - awful might happen 2 1  ?Total GAD 7 Score 17 12  ?Anxiety Difficulty Somewhat difficult Somewhat difficult  ? ? ? ? ? ?   ?Assessment & Plan:  ? ?Problem List Items Addressed This Visit   ? ?  ? Other  ? Anxiety and depression  ? Relevant Medications  ? mirtazapine (REMERON) 7.5 MG tablet  ? Sleep disturbance  ?  Likely secondary to anxiety and depression.  She failed trazodone in the past and is currently not responding to Benadryl.  She has a history of benzo overdose so we will avoid any controlled substance.  Discussed trial of Remeron 7.5 mg prescribed to help with sleep.  Follow-up with PCP in 2 weeks to see improvement. ?  ?  ? Relevant Medications  ? mirtazapine (REMERON) 7.5 MG tablet  ? Generalized anxiety disorder  ?  Significant impact on life see depression plan decrease Cymbalta to 60 mg and monitor for worsening.  Follow-up with PCP in 2 weeks. ?  ?  ? Relevant Medications  ? mirtazapine (REMERON) 7.5 MG tablet  ? Other Relevant Orders  ? Ambulatory referral to Psychiatry  ? Current severe episode of major depressive disorder without psychotic features without prior  episode ([place]) - Primary  ?  Patient notes symptoms worsening over the last 6 months.  Reviewed last PCP visit in October with recommendation for 4-week follow-up which patient has not done.  Unclear if 90 mg of Cymbalta has made a difference, discussed reducing back to 60 mg and monitoring for worsening symptoms.  Discussed that today I would only do 1 medication change see insomnia plan.  Advised follow-up with PCP for continued management of depression and anxiety.  Discussed she could consider BuSpar Wellbutrin or even changing her SSRI.  Also placed referral to psychiatry given age and limited improvement on current high-dose SSRI.  Also strongly encourage therapy she has people she is interested in seeing needs to check with her insurance. ?  ?  ? Relevant Medications  ? mirtazapine (REMERON) 7.5 MG tablet  ? Other Relevant Orders  ? Ambulatory referral to Psychiatry  ? ? ? ?Return in about 2 weeks (around 03/21/2022). ? ?Lesleigh Noe, MD ? ?This visit occurred during the SARS-CoV-2 public health emergency.  Safety protocols were in place, including screening questions prior to the visit, additional usage of staff PPE, and extensive cleaning of exam room while observing appropriate contact time as indicated for disinfecting solutions.  ? ?

## 2022-03-07 NOTE — Assessment & Plan Note (Signed)
Significant impact on life see depression plan decrease Cymbalta to 60 mg and monitor for worsening.  Follow-up with PCP in 2 weeks. ?

## 2022-03-07 NOTE — Assessment & Plan Note (Signed)
Likely secondary to anxiety and depression.  She failed trazodone in the past and is currently not responding to Benadryl.  She has a history of benzo overdose so we will avoid any controlled substance.  Discussed trial of Remeron 7.5 mg prescribed to help with sleep.  Follow-up with PCP in 2 weeks to see improvement. ?

## 2022-03-07 NOTE — Assessment & Plan Note (Signed)
Patient notes symptoms worsening over the last 6 months.  Reviewed last PCP visit in October with recommendation for 4-week follow-up which patient has not done.  Unclear if 90 mg of Cymbalta has made a difference, discussed reducing back to 60 mg and monitoring for worsening symptoms.  Discussed that today I would only do 1 medication change see insomnia plan.  Advised follow-up with PCP for continued management of depression and anxiety.  Discussed she could consider BuSpar Wellbutrin or even changing her SSRI.  Also placed referral to psychiatry given age and limited improvement on current high-dose SSRI.  Also strongly encourage therapy she has people she is interested in seeing needs to check with her insurance. ?

## 2022-03-07 NOTE — Patient Instructions (Addendum)
Sleep ?- Start Mirtazapine at night for sleep ? ?Anxiety and Depression ?- continue with search for therapist ?- Continue Cymbalta 60 mg ?- Stop Cymbalta 30 mg --- to see if symptoms change, if worsening symptoms restart 30 mg tablet ? ?Return to see Anda Kraft in 2 weeks ? ?#Referral ?I have placed a referral to a specialist for you. You should receive a phone call from the specialty office. Make sure your voicemail is not full and that if you are able to answer your phone to unknown or new numbers.  ? ?It may take up to 2 weeks to hear about the referral. If you do not hear anything in 2 weeks, please call our office and ask to speak with the referral coordinator.  ? ?- Psychiatry referral ?

## 2022-03-20 ENCOUNTER — Telehealth: Payer: Self-pay

## 2022-03-20 NOTE — Progress Notes (Signed)
Error

## 2022-03-22 ENCOUNTER — Encounter: Payer: Self-pay | Admitting: Primary Care

## 2022-03-22 ENCOUNTER — Ambulatory Visit (INDEPENDENT_AMBULATORY_CARE_PROVIDER_SITE_OTHER): Payer: Medicare Other | Admitting: Primary Care

## 2022-03-22 VITALS — BP 124/84 | HR 102 | Ht 64.5 in | Wt 217.8 lb

## 2022-03-22 DIAGNOSIS — F32A Depression, unspecified: Secondary | ICD-10-CM

## 2022-03-22 DIAGNOSIS — F419 Anxiety disorder, unspecified: Secondary | ICD-10-CM

## 2022-03-22 MED ORDER — BUPROPION HCL ER (SR) 100 MG PO TB12
100.0000 mg | ORAL_TABLET | Freq: Two times a day (BID) | ORAL | 0 refills | Status: DC
Start: 2022-03-22 — End: 2022-04-20

## 2022-03-22 NOTE — Assessment & Plan Note (Signed)
Uncontrolled. ? ?Discussed various options for treatment.  She will be calling local therapy offices soon. ? ?Reduce duloxetine to 60 mg daily. ?Start bupropion SR 100 mg daily x3 to 5 days, then increase to 100 mg twice daily thereafter. ? ?We discussed the importance of strict follow-up, she will return in 1 month.  She will call sooner if she has any problems. ? ?She will also call the psychiatry office to set up an appointment. ?

## 2022-03-22 NOTE — Progress Notes (Signed)
? ?Subjective:  ? ? Patient ID: Denise [name], female    DOB: 1951/06/14, 71 y.o.   MRN: [id number] ? ?HPI ? ?Denise [name] is a very pleasant 71 y.o. female with history of hypertension, migraines, hypothyroidism, anxiety and depression, hyperlipidemia, chest pain who presents today for follow-up of anxiety and insomnia. ? ?Evaluated by Dr. Einar Pheasant on 03/07/2022 endorsing a 26-monthhistory of anxiety, also a lot of personal stress at home.  She has a [facility] of [facility] attempts last year[facility]  Currently managed on duloxetine 90 mg daily.  She has failed trazodone and melatonin for sleep in the past. Referral was placed for psychiatry. Her duloxetine was reduced to 60 mg daily, she was prescribed mirtazapine 7.5 mg at night for sleep.  She is here for follow-up today. ? ?Today she endorses not reducing her duloxetine to 60 mg as she wanted to see how she did on mirtazapine. She developed nightmares and waking from a rapid heartbeat when taking mirtazapine so she discontinued.  ? ?Symptoms include feeling nervous inside, difficulty sleeping with mind racing thoughts, feeling down/depressed. She has a lot of stress at home, one daughter is living abroad and the other daughter isn't speaking with her. Her dog also recently passed away.  ? ?She has yet to called regarding a therapist. She has yet to hear from psychiatry. She's tried Lexapro and Zoloft years ago, isn't sure how she felt on these medications. She has noticed suicidal thoughts, has no plan.  ? ?She's been managed on duloxetine for years, originally prescribed duloxetine for migraine prevention, most of which were caused by her prior job. She has since retired.  ? ? ?Review of Systems  ?Constitutional:  Positive for fatigue.  ?Psychiatric/Behavioral:  The patient is nervous/anxious.   ?     See HPI  ? ?   ? ? ?Past Medical History:  ?Diagnosis Date  ? Allergy   ? seasonal  ? Arthritis   ? Bursitis of hip   ?  Left  ? Constipation   ? 90%of the time c/o constipation, occ has diarrhea - uses stool softener   ? Endometriosis   ? Facial flushing 05/12/2014  ? Fibroid   ? GERD (gastroesophageal reflux disease)   ? Hematochezia   ? Hiatal hernia   ? HSV-1 (herpes simplex virus 1) infection   ? Hyperlipidemia   ? Hyperplastic colon polyp   ? Hypertension   ? Hypothyroidism   ? [place]  ? LBBB (left bundle branch block)   ? Low back pain   ? Migraine   ? Osteopenia 2008  ? -1.2 FEMORAL NECK  ? Prediabetes   ? Stroke [facility]   ? TIA  ? TIA (transient ischemic attack) 08-10-2013  ? ? ?Social History  ? ?Socioeconomic History  ? Marital status: Divorced  ?  Spouse name: Not on file  ? Number of children: 2  ? Years of education: Not on file  ? Highest education level: Not on file  ?Occupational History  ?  Employer: RETIRED  ?Tobacco Use  ? Smoking status: Never  ? Smokeless tobacco: Never  ?Vaping Use  ? Vaping Use: Never used  ?Substance and Sexual Activity  ? Alcohol use: Yes  ?  Alcohol/week: 0.0 standard drinks  ?  Comment: 1 every 3 months  ? Drug use: No  ? Sexual activity: Not Currently  ?  Comment: intercourse age 71 more than 5 sexual  partners,des neg  ?Other Topics Concern  ? Not on file  ?Social History Narrative  ? Single.  ? 2 daughters, 4 grandchildren.  ? Once worked for [employer].  ? Enjoys Ambulance person.   ? ?Social Determinants of Health  ? ?Financial Resource Strain: Low Risk   ? Difficulty of Paying Living Expenses: Not hard at all  ?Food Insecurity: Not on file  ?Transportation Needs: Not on file  ?Physical Activity: Not on file  ?Stress: Not on file  ?Social Connections: Not on file  ?Intimate Partner Violence: Not on file  ? ? ?Past Surgical History:  ?Procedure Laterality Date  ? ABDOMINAL HYSTERECTOMY  1989  ? TAH, BSO  ? CHOLECYSTECTOMY  2003  ? COLONOSCOPY    ? KNEE SURGERY  2004  ? right  ? NASAL SEPTUM SURGERY  2012  ? PITUITARY SURGERY  2001  ?  POLYPECTOMY    ? SHOULDER SURGERY  2007  ? right  ? THYROID LOBECTOMY  2002  ? ? ?Family History  ?Problem Relation Age of Onset  ? Heart disease Father   ? Cancer Mother   ?     brain tumor  ? Breast cancer Maternal Aunt   ? Parkinson's disease Sister   ? Colon cancer Neg Hx   ? Colon polyps Neg Hx   ? Rectal cancer Neg Hx   ? Stomach cancer Neg Hx   ? Thyroid disease Neg Hx   ? Esophageal cancer Neg Hx   ? ? ?Allergies  ?Allergen Reactions  ? Tramadol Other (See Comments)  ?  Reaction: NIGHTMARES  ? ? ?Current Outpatient Medications on File Prior to Visit  ?Medication Sig Dispense Refill  ? amLODipine (NORVASC) 10 MG tablet TAKE 1 TABLET BY MOUTH  DAILY FOR BLOOD PRESSURE (Patient taking differently: Take 10 mg by mouth daily.) 90 tablet 2  ? aspirin 81 MG tablet Take 81 mg by mouth daily.    ? atorvastatin (LIPITOR) 10 MG tablet TAKE 1 TABLET BY MOUTH  DAILY FOR CHOLESTEROL 90 tablet 3  ? diphenhydrAMINE (SOMINEX) 25 MG tablet Take 50 mg by mouth at bedtime as needed for sleep.    ? DULoxetine (CYMBALTA) 30 MG capsule TAKE 1 CAPSULE BY MOUTH DAILY  FOR ANXIETY AND DEPRESSION. TAKE WITH 60 MG CAPSULE. 90 capsule 0  ? DULoxetine (CYMBALTA) 60 MG capsule TAKE 1 CAPSULE BY MOUTH  DAILY FOR HEADACHE  PREVENTION 90 capsule 3  ? esomeprazole (NEXIUM) 20 MG capsule Take 1 capsule (20 mg total) by mouth every morning. 30 capsule 11  ? levothyroxine (SYNTHROID) 75 MCG tablet TAKE 1 TABLET BY MOUTH IN  THE MORNING ON AN EMPTY  STOMACH WITH WATER ONLY. NO FOOD OR OTHER MEDICATIONS  FOR 1/2 HOUR 90 tablet 3  ? metoprolol succinate (TOPROL-XL) 25 MG 24 hr tablet TAKE 1 TABLET BY MOUTH  DAILY FOR BLOOD PRESSURE 90 tablet 2  ? rizatriptan (MAXALT) 5 MG tablet Take 1 tablet by mouth at migraine onset. May repeat with 1 tablet in 2 hours if needed. (Patient taking differently: Take 5 mg by mouth daily as needed for migraine.) 10 tablet 0  ? mirtazapine (REMERON) 7.5 MG tablet Take 1 tablet (7.5 mg total) by mouth at bedtime.  (Patient not taking: Reported on 03/22/2022) 30 tablet 0  ? ?No current facility-administered medications on file prior to visit.  ? ? ?BP 124/84   Pulse (!) 102   Ht 5' 4.5" (1.638 m)   Wt  217 lb 12.8 oz (98.8 kg)   SpO2 95%   BMI 36.81 kg/m?  ?Objective:  ? Physical Exam ?Cardiovascular:  ?   Rate and Rhythm: Normal rate and regular rhythm.  ?Pulmonary:  ?   Effort: Pulmonary effort is normal.  ?   Breath sounds: Normal breath sounds.  ?Musculoskeletal:  ?   Cervical back: Neck supple.  ?Skin: ?   General: Skin is warm and dry.  ?Psychiatric:  ?   Comments: Tearful during exam  ? ? ? ? ? ?   ?Assessment & Plan:  ? ? ? ? ?This visit occurred during the SARS-CoV-2 public health emergency.  Safety protocols were in place, including screening questions prior to the visit, additional usage of staff PPE, and extensive cleaning of exam room while observing appropriate contact time as indicated for disinfecting solutions.  ?

## 2022-03-22 NOTE — Patient Instructions (Signed)
We reduced your duloxetine to 60 mg daily.  Do not take the 30 mg capsule of duloxetine. ? ?Start bupropion SR 100 mg tablets for depression.  Take 1 tablet by mouth once daily for 3 to 5 days, then increase to 1 tablet by mouth twice daily thereafter. ? ?Call behavioral health in [place] regarding the psychiatry appointment. ? ?Try to connect with a local therapist. ? ?Schedule follow-up visit for 1 month. ? ?It was a pleasure to see you today! ? ?

## 2022-03-27 ENCOUNTER — Telehealth: Payer: Self-pay | Admitting: Primary Care

## 2022-03-27 NOTE — Telephone Encounter (Signed)
Lvm for the patient to return my call re: copay ? ?Teams message received from [place] 4.6.23 ?[employer] called about pt [phone] and says that pt was charged a $25 copay to see her PCP [name] today. She shouldn't be charged anything as we are in her network and Anda Kraft is her PCP. Please advise pt how this will be fixed and how she will get a refund.  ?

## 2022-03-27 NOTE — Telephone Encounter (Signed)
Patient returned my call, no copay was collected on 4.6.23, patient wanted to confirm nothing had changed with her insurance. Assured patient that her card reflected no copay. ? ?No further action was taken. ?

## 2022-04-15 ENCOUNTER — Other Ambulatory Visit: Payer: Self-pay | Admitting: Primary Care

## 2022-04-15 DIAGNOSIS — I10 Essential (primary) hypertension: Secondary | ICD-10-CM

## 2022-04-18 ENCOUNTER — Other Ambulatory Visit: Payer: Self-pay | Admitting: Primary Care

## 2022-04-18 DIAGNOSIS — F32A Anxiety disorder, unspecified: Secondary | ICD-10-CM

## 2022-04-18 DIAGNOSIS — F419 Anxiety disorder, unspecified: Secondary | ICD-10-CM

## 2022-04-19 ENCOUNTER — Ambulatory Visit: Payer: Medicare Other | Admitting: Primary Care

## 2022-04-26 ENCOUNTER — Ambulatory Visit (INDEPENDENT_AMBULATORY_CARE_PROVIDER_SITE_OTHER): Payer: Medicare Other | Admitting: Primary Care

## 2022-04-26 VITALS — BP 126/68 | HR 100 | Temp 98.6°F | Ht 64.5 in | Wt 219.0 lb

## 2022-04-26 DIAGNOSIS — R7303 Prediabetes: Secondary | ICD-10-CM

## 2022-04-26 DIAGNOSIS — F32A Depression, unspecified: Secondary | ICD-10-CM

## 2022-04-26 DIAGNOSIS — F419 Anxiety disorder, unspecified: Secondary | ICD-10-CM | POA: Diagnosis not present

## 2022-04-26 DIAGNOSIS — G479 Sleep disorder, unspecified: Secondary | ICD-10-CM | POA: Diagnosis not present

## 2022-04-26 LAB — POCT GLYCOSYLATED HEMOGLOBIN (HGB A1C): Hemoglobin A1C: 6 % — AB (ref 4.0–5.6)

## 2022-04-26 MED ORDER — MIRTAZAPINE 7.5 MG PO TABS: 7.5000 mg | ORAL_TABLET | Freq: Every day | ORAL | 0 refills | Status: DC

## 2022-04-26 NOTE — Assessment & Plan Note (Signed)
No improvement despite reduction in anxiety and depression symptoms. ? ?Continue to treat anxiety depression. ?Agree to resume mirtazapine 7.5 mg at bedtime. ? ?Referral placed to pulmonology for sleep study as symptoms are active. ?

## 2022-04-26 NOTE — Progress Notes (Signed)
? ?Subjective:  ? ? Patient ID: Denise [name], female    DOB: 1951/12/04, 71 y.o.   MRN: [id number] ? ?HPI ? ?Denise [name] is a very pleasant 71 y.o. female with a history of migraines, hypertension, hypothyroidism, anxiety, depression, prediabetes who presents today for follow-up of depression.  She would also like her A1c checked. ? ?She was last evaluated on 03/22/2022 for ongoing symptoms of anxiety and depression, personal stress at home. During this visit she endorsed that she cannot tolerate mirtazapine as it caused nightmares.  Symptoms included feeling nervous, difficulty sleeping, mind racing thoughts, feeling down/depressed.  She has yet to hear regarding a referral to psychiatry and therapy. ? ?Given her ongoing symptoms we encouraged her to call local therapy offices and to call the psychiatry office for which she was referred.  We reduced her duloxetine to 60 mg daily, started bupropion SR 100 mg twice daily.  She is here for follow-up today. ? ?Since her last visit she is compliant to her duloxetine 60 mg daily and bupropion SR 100 mg BID. She's overall feeling better! Positive effects include feeling less anxious, she is worrying less, she feels less depressed. She did resume mirtazapine 7.5 mg nightly a few weeks ago and did well. She is out now and wants refills.  ? ?She continues to struggle with sleep, difficulty falling and staying sleep, feeling tired during the day. She has never been diagnosed with sleep apnea.  ? ? ?Review of Systems  ?Respiratory:  Negative for shortness of breath.   ?Cardiovascular:  Negative for chest pain.  ?Psychiatric/Behavioral:  Positive for sleep disturbance. The patient is nervous/anxious.   ?     See HPI  ? ?   ? ? ?Past Medical History:  ?Diagnosis Date  ? Allergy   ? seasonal  ? Arthritis   ? Bursitis of hip   ? Left  ? Constipation   ? 90%of the time c/o constipation, occ has diarrhea - uses stool softener   ? Endometriosis   ? Facial flushing  05/12/2014  ? Fibroid   ? GERD (gastroesophageal reflux disease)   ? Hematochezia   ? Hiatal hernia   ? HSV-1 (herpes simplex virus 1) infection   ? Hyperlipidemia   ? Hyperplastic colon polyp   ? Hypertension   ? Hypothyroidism   ? Ischemic colitis ([place]) 01/2019  ? LBBB (left bundle branch block)   ? Low back pain   ? Migraine   ? Osteopenia 2008  ? -1.2 FEMORAL NECK  ? Prediabetes   ? Stroke [facility])   ? TIA  ? TIA (transient ischemic attack) 08-10-2013  ? ? ?Social History  ? ?Socioeconomic History  ? Marital status: Divorced  ?  Spouse name: Not on file  ? Number of children: 2  ? Years of education: Not on file  ? Highest education level: Not on file  ?Occupational History  ?  Employer: RETIRED  ?Tobacco Use  ? Smoking status: Never  ? Smokeless tobacco: Never  ?Vaping Use  ? Vaping Use: Never used  ?Substance and Sexual Activity  ? Alcohol use: Yes  ?  Alcohol/week: 0.0 standard drinks  ?  Comment: 1 every 3 months  ? Drug use: No  ? Sexual activity: Not Currently  ?  Comment: intercourse age 2, more than 5 sexual partners,des neg  ?Other Topics Concern  ? Not on file  ?Social History Narrative  ? Single.  ? 2 daughters, 4 grandchildren.  ? Once  worked for [employer].  ? Enjoys Ambulance person.   ? ?Social Determinants of Health  ? ?Financial Resource Strain: Low Risk   ? Difficulty of Paying Living Expenses: Not hard at all  ?Food Insecurity: Not on file  ?Transportation Needs: Not on file  ?Physical Activity: Not on file  ?Stress: Not on file  ?Social Connections: Not on file  ?Intimate Partner Violence: Not on file  ? ? ?Past Surgical History:  ?Procedure Laterality Date  ? ABDOMINAL HYSTERECTOMY  1989  ? TAH, BSO  ? CHOLECYSTECTOMY  2003  ? COLONOSCOPY    ? KNEE SURGERY  2004  ? right  ? NASAL SEPTUM SURGERY  2012  ? PITUITARY SURGERY  2001  ? POLYPECTOMY    ? SHOULDER SURGERY  2007  ? right  ? THYROID LOBECTOMY  2002  ? ? ?Family History  ?Problem Relation Age of Onset  ? Heart  disease Father   ? Cancer Mother   ?     brain tumor  ? Breast cancer Maternal Aunt   ? Parkinson's disease Sister   ? Colon cancer Neg Hx   ? Colon polyps Neg Hx   ? Rectal cancer Neg Hx   ? Stomach cancer Neg Hx   ? Thyroid disease Neg Hx   ? Esophageal cancer Neg Hx   ? ? ?Allergies  ?Allergen Reactions  ? Tramadol Other (See Comments)  ?  Reaction: NIGHTMARES  ? ? ?Current Outpatient Medications on File Prior to Visit  ?Medication Sig Dispense Refill  ? amLODipine (NORVASC) 10 MG tablet Take 1 tablet (10 mg total) by mouth daily. for blood pressure. Due in July for office visit. 90 tablet 0  ? aspirin 81 MG tablet Take 81 mg by mouth daily.    ? atorvastatin (LIPITOR) 10 MG tablet TAKE 1 TABLET BY MOUTH  DAILY FOR CHOLESTEROL 90 tablet 3  ? buPROPion ER (WELLBUTRIN SR) 100 MG 12 hr tablet TAKE 1 TABLET (100 MG TOTAL) BY MOUTH 2 (TWO) TIMES DAILY. FOR DEPRESSION 60 tablet 0  ? DULoxetine (CYMBALTA) 60 MG capsule TAKE 1 CAPSULE BY MOUTH  DAILY FOR HEADACHE  PREVENTION 90 capsule 3  ? esomeprazole (NEXIUM) 20 MG capsule Take 1 capsule (20 mg total) by mouth every morning. 30 capsule 11  ? levothyroxine (SYNTHROID) 75 MCG tablet TAKE 1 TABLET BY MOUTH IN  THE MORNING ON AN EMPTY  STOMACH WITH WATER ONLY. NO FOOD OR OTHER MEDICATIONS  FOR 1/2 HOUR 90 tablet 3  ? metoprolol succinate (TOPROL-XL) 25 MG 24 hr tablet TAKE 1 TABLET BY MOUTH  DAILY FOR BLOOD PRESSURE 90 tablet 2  ? rizatriptan (MAXALT) 5 MG tablet Take 1 tablet by mouth at migraine onset. May repeat with 1 tablet in 2 hours if needed. (Patient taking differently: Take 5 mg by mouth daily as needed for migraine.) 10 tablet 0  ? ?No current facility-administered medications on file prior to visit.  ? ? ?BP 126/68   Pulse 100   Temp 98.6 ?F (37 ?C) (Oral)   Ht 5' 4.5" (1.638 m)   Wt 219 lb (99.3 kg)   SpO2 96%   BMI 37.01 kg/m?  ?Objective:  ? Physical Exam ?Cardiovascular:  ?   Rate and Rhythm: Normal rate and regular rhythm.  ?Pulmonary:  ?   Effort:  Pulmonary effort is normal.  ?   Breath sounds: Normal breath sounds.  ?Musculoskeletal:  ?   Cervical back: Neck supple.  ?Skin: ?  General: Skin is warm and dry.  ?Neurological:  ?   Mental Status: She is alert.  ?Psychiatric:     ?   Mood and Affect: Mood normal.  ? ? ? ? ? ?   ?Assessment & Plan:  ? ? ? ? ?This visit occurred during the SARS-CoV-2 public health emergency.  Safety protocols were in place, including screening questions prior to the visit, additional usage of staff PPE, and extensive cleaning of exam room while observing appropriate contact time as indicated for disinfecting solutions.  ?

## 2022-04-26 NOTE — Assessment & Plan Note (Signed)
Improving!  ? ?Continue bupropion SR 100 mg BID, duloxetine 60 mg daily. ? ?She has not called local therapists.  ?She has not followed up with psychiatry.  ? ?For now she will continue to follow with [place] for her symptoms as they are improving. Requested that she reach out to therapy.  ?

## 2022-04-26 NOTE — Patient Instructions (Signed)
Continue bupropion SR 100 mg twice daily for depression. ? ?Continue duloxetine 60 mg daily for anxiety and depression. ? ?You may resume mirtazapine 7.5 mg at bedtime for sleep. ? ?You will be contacted regarding your referral to pulmonology for sleep study.  Please let us know if you have not been contacted within two weeks.  ? ?It was a pleasure to see you today! ? ? ?

## 2022-04-26 NOTE — Assessment & Plan Note (Signed)
A1c of 6.0 today which is overall stable compared to prior years. ? ?Commended her on dietary changes, encouraged to continue. ?

## 2022-05-01 ENCOUNTER — Other Ambulatory Visit: Payer: Self-pay | Admitting: Primary Care

## 2022-05-01 ENCOUNTER — Telehealth: Payer: Self-pay

## 2022-05-01 DIAGNOSIS — I10 Essential (primary) hypertension: Secondary | ICD-10-CM

## 2022-05-01 NOTE — Telephone Encounter (Signed)
Unsuccessful attempt to reach patient on preferred number listed in notes for scheduled AWV. Left message on voicemail okay to reschedule. 

## 2022-05-09 ENCOUNTER — Other Ambulatory Visit: Payer: Self-pay | Admitting: Primary Care

## 2022-05-09 DIAGNOSIS — Z1231 Encounter for screening mammogram for malignant neoplasm of breast: Secondary | ICD-10-CM

## 2022-05-10 ENCOUNTER — Telehealth: Payer: Self-pay | Admitting: Primary Care

## 2022-05-10 DIAGNOSIS — J301 Allergic rhinitis due to pollen: Secondary | ICD-10-CM | POA: Diagnosis not present

## 2022-05-10 DIAGNOSIS — K219 Gastro-esophageal reflux disease without esophagitis: Secondary | ICD-10-CM | POA: Diagnosis not present

## 2022-05-10 DIAGNOSIS — J329 Chronic sinusitis, unspecified: Secondary | ICD-10-CM | POA: Diagnosis not present

## 2022-05-10 NOTE — Telephone Encounter (Signed)
Left message for patient to call back and schedule Medicare Annual Wellness Visit (AWV) either virtually or phone   awvi 02/14/18 per [facility]  please schedule at anytime with health coach  This should be a 45 minute visit.  I left my direct # [phone]

## 2022-05-11 ENCOUNTER — Telehealth: Payer: Self-pay | Admitting: Primary Care

## 2022-05-11 ENCOUNTER — Telehealth: Payer: Self-pay

## 2022-05-11 DIAGNOSIS — E039 Hypothyroidism, unspecified: Secondary | ICD-10-CM

## 2022-05-11 DIAGNOSIS — E785 Hyperlipidemia, unspecified: Secondary | ICD-10-CM

## 2022-05-11 NOTE — Progress Notes (Signed)
    Chronic Care Management Pharmacy Assistant   Name: Denise [name]  MRN: [id number] DOB: 1951-03-16  Reason for Encounter: CCM (Reschedule Appointment)   Patient was contact to reschedule their CCM appointment with Charlene Brooke.  Patient has been reschedule for 07/17/2022 at 3:00.   Charlene Brooke, CPP notified  Marijean Niemann, [place] Clinical Pharmacy Assistant [phone]

## 2022-05-13 NOTE — Telephone Encounter (Signed)
Patient needs general follow up visit for health conditions.  Please schedule for July.

## 2022-05-17 DIAGNOSIS — H2513 Age-related nuclear cataract, bilateral: Secondary | ICD-10-CM | POA: Diagnosis not present

## 2022-05-17 DIAGNOSIS — H532 Diplopia: Secondary | ICD-10-CM | POA: Diagnosis not present

## 2022-05-17 DIAGNOSIS — H43813 Vitreous degeneration, bilateral: Secondary | ICD-10-CM | POA: Diagnosis not present

## 2022-05-17 NOTE — Telephone Encounter (Signed)
Left message to return call to our office.  

## 2022-05-18 NOTE — Telephone Encounter (Signed)
Pt scheduled for 06/27/22 for CPE

## 2022-05-18 NOTE — Telephone Encounter (Signed)
Noted  

## 2022-05-19 ENCOUNTER — Other Ambulatory Visit: Payer: Self-pay | Admitting: Primary Care

## 2022-05-19 DIAGNOSIS — F32A Depression, unspecified: Secondary | ICD-10-CM

## 2022-05-19 DIAGNOSIS — F419 Anxiety disorder, unspecified: Secondary | ICD-10-CM

## 2022-05-23 ENCOUNTER — Other Ambulatory Visit: Payer: Self-pay | Admitting: Otolaryngology

## 2022-05-23 DIAGNOSIS — R1310 Dysphagia, unspecified: Secondary | ICD-10-CM

## 2022-05-30 DIAGNOSIS — M542 Cervicalgia: Secondary | ICD-10-CM | POA: Diagnosis not present

## 2022-06-06 ENCOUNTER — Telehealth: Payer: Self-pay

## 2022-06-06 NOTE — Telephone Encounter (Signed)
Spoke to patient by telephone and was advised that she has had SOB for several months but it has gotten much worse recently. Patent stated that she is having swelling in her feet and a cough. Patient stated that she has noticed some wheezing. Patient stated that she has been coughing up mucus with a pink tinge to it. Patient was advised that she should not wait to be seen. Patient was given information on the [facility] at [facility]. Patient stated that she would go to an Urgent Care but thinks that she will be able to have more test done at an ER if she needs them done. Patient stated that she is going to head to the [facility] at [facility].  Appointment not cancelled for Tuesday in case she needs a follow-up from the ER. Patient stated that she will call back and cancel the appointment 06/12/22 if it is not needed.

## 2022-06-06 NOTE — Telephone Encounter (Signed)
Patient scheduled an appointment through [facility] for shortness of breath, weakness, swelling of legs and feet, persistent coughing. Triage outcome was to be seen in 3-4 hours, advised that we didn't have anything, patient states that she was told by [facility] that it would even be okay to be seen tomorrow morning. Again advised we didn't have anything and asked patient if she would be willing to go to an urgent care or an ED to which she responded with no and said she was going to just keep her appointment on Tuesday with Dr.Cody. Messaged Rollene Fare to speak with patient.

## 2022-06-06 NOTE — Telephone Encounter (Signed)
PLEASE NOTE: All timestamps contained within this report are represented as [place] Standard Time. CONFIDENTIALTY NOTICE: This fax transmission is intended only for the addressee. It contains information that is legally privileged, confidential or otherwise protected from use or disclosure. If you are not the intended recipient, you are strictly prohibited from reviewing, disclosing, copying using or disseminating any of this information or taking any action in reliance on or regarding this information. If you have received this fax in error, please notify us immediately by telephone so that we can arrange for its return to [place]. Phone: [phone], Toll-Free: [phone], Fax: [phone] Page: 1 of 2 Call Id: [id number] [place] [facility] - Client TELEPHONE ADVICE RECORD [facility] Patient Name: Denise [name] Gender: Female DOB: 1951-11-23 Age: 71 Y 10 M 23 D Return Phone Number: [phone] (Primary) Address: City/ State/ Zip: [place] [address] Client [place] [facility] - Client Client Site [place] - Day Provider Alma Friendly - NP Contact Type Call Who Is Calling Patient / Member / Family / Caregiver Call Type Triage / Clinical Relationship To Patient Self Return Phone Number [phone] (Primary) Chief Complaint BREATHING - shortness of breath or sounds breathless Reason for Call Symptomatic / Request for Health Information Initial Comment Caller she has shortness of breath, leg/feet swelling and persistent cough. Translation No Nurse Assessment Nurse: Zorita Pang, RN, [place] Date/Time (Eastern Time): 06/06/2022 3:29:57 PM Confirm and document reason for call. If symptomatic, describe symptoms. ---The caller states that she is having shortness of breath which is new. Feet and leg swelling has been for about a year but they are worse. Went to [employer] and you could not see her ankles  and calves were tight. Feet were burning. Generalized fatigue. Has depression/anxiety. Recently saw a ENT and he wanted her to have swallowing study. Had prescribed PCN and nasal spray. Does the patient have any new or worsening symptoms? ---Yes Will a triage be completed? ---Yes Related visit to physician within the last 2 weeks? ---No Does the PT have any chronic conditions? (i.e. diabetes, asthma, this includes High risk factors for pregnancy, etc.) ---Yes List chronic conditions. ---depression/anxiety Is this a behavioral health or substance abuse call? ---No Guidelines Guideline Title Affirmed Question Affirmed Notes Nurse Date/Time Eilene Ghazi Time) Breathing Difficulty [1] Longstanding difficulty breathing (e.g., CHF, COPD, emphysema) AND Womble, RN, Jozlyn 06/06/2022 3:40:33 PM PLEASE NOTE: All timestamps contained within this report are represented as [place] Standard Time. CONFIDENTIALTY NOTICE: This fax transmission is intended only for the addressee. It contains information that is legally privileged, confidential or otherwise protected from use or disclosure. If you are not the intended recipient, you are strictly prohibited from reviewing, disclosing, copying using or disseminating any of this information or taking any action in reliance on or regarding this information. If you have received this fax in error, please notify us immediately by telephone so that we can arrange for its return to [place]. Phone: [phone], Toll-Free: [phone], Fax: [phone] Page: 2 of 2 Call Id: [id number] Guidelines Guideline Title Affirmed Question Affirmed Notes Nurse Date/Time Eilene Ghazi Time) [2] WORSE than normal Disp. Time Eilene Ghazi Time) Disposition Final User 06/06/2022 3:27:51 PM Send to Urgent Reinaldo Berber 06/06/2022 3:53:22 PM See HCP within 4 Hours (or PCP triage) Yes Zorita Pang, RN, Garrel Ridgel Disagree/Comply Comply Caller Understands Yes PreDisposition Call  Doctor Care Advice Given Per Guideline SEE HCP (OR PCP TRIAGE) WITHIN 4 HOURS: * You become worse Comments User: Marquis Buggy,  RN Date/Time Eilene Ghazi Time): 06/06/2022 3:56:36 PM This nurse attempted to call the backline and there was no answer. The caller declined going to UC. She was instructed to see if she can get her appointment for next Tuesday to be moved up to preferably tomorrow. She was also instructed to call back if her symptoms worsened and she verbalized understanding. Referrals REFERRED TO PCP OFFICE

## 2022-06-07 NOTE — Telephone Encounter (Signed)
Agree with assessment. 

## 2022-06-12 ENCOUNTER — Ambulatory Visit (INDEPENDENT_AMBULATORY_CARE_PROVIDER_SITE_OTHER)
Admission: RE | Admit: 2022-06-12 | Discharge: 2022-06-12 | Disposition: A | Discharge status: Home / Self Care | Payer: Medicare Other | Source: Ambulatory Visit | Attending: Family Medicine | Admitting: Family Medicine

## 2022-06-12 ENCOUNTER — Ambulatory Visit (INDEPENDENT_AMBULATORY_CARE_PROVIDER_SITE_OTHER): Payer: Medicare Other | Admitting: Family Medicine

## 2022-06-12 VITALS — BP 110/80 | HR 116 | Temp 98.0°F | Wt 228.5 lb

## 2022-06-12 DIAGNOSIS — R0602 Shortness of breath: Secondary | ICD-10-CM

## 2022-06-12 DIAGNOSIS — R9431 Abnormal electrocardiogram [ECG] [EKG]: Secondary | ICD-10-CM

## 2022-06-12 DIAGNOSIS — I447 Left bundle-branch block, unspecified: Secondary | ICD-10-CM

## 2022-06-12 DIAGNOSIS — R059 Cough, unspecified: Secondary | ICD-10-CM | POA: Diagnosis not present

## 2022-06-12 DIAGNOSIS — R600 Localized edema: Secondary | ICD-10-CM

## 2022-06-12 LAB — CBC WITH DIFFERENTIAL/PLATELET
Basophils Absolute: 0 10*3/uL (ref 0.0–0.1)
Basophils Relative: 0.3 % (ref 0.0–3.0)
Eosinophils Absolute: 0.4 10*3/uL (ref 0.0–0.7)
Eosinophils Relative: 3.3 % (ref 0.0–5.0)
HCT: 34.7 % — ABNORMAL LOW (ref 36.0–46.0)
Hemoglobin: 11.2 g/dL — ABNORMAL LOW (ref 12.0–15.0)
Lymphocytes Relative: 16.4 % (ref 12.0–46.0)
Lymphs Abs: 2.2 10*3/uL (ref 0.7–4.0)
MCHC: 32.3 g/dL (ref 30.0–36.0)
MCV: 78.3 fl (ref 78.0–100.0)
Monocytes Absolute: 0.9 10*3/uL (ref 0.1–1.0)
Monocytes Relative: 6.8 % (ref 3.0–12.0)
Neutro Abs: 10 10*3/uL — ABNORMAL HIGH (ref 1.4–7.7)
Neutrophils Relative %: 73.2 % (ref 43.0–77.0)
Platelets: 368 10*3/uL (ref 150.0–400.0)
RBC: 4.43 Mil/uL (ref 3.87–5.11)
RDW: 15.2 % (ref 11.5–15.5)
WBC: 13.7 10*3/uL — ABNORMAL HIGH (ref 4.0–10.5)

## 2022-06-12 LAB — COMPREHENSIVE METABOLIC PANEL
ALT: 12 U/L (ref 0–35)
AST: 14 U/L (ref 0–37)
Albumin: 3.9 g/dL (ref 3.5–5.2)
Alkaline Phosphatase: 174 U/L — ABNORMAL HIGH (ref 39–117)
BUN: 11 mg/dL (ref 6–23)
CO2: 26 mEq/L (ref 19–32)
Calcium: 8.7 mg/dL (ref 8.4–10.5)
Chloride: 103 mEq/L (ref 96–112)
Creatinine, Ser: 0.86 mg/dL (ref 0.40–1.20)
GFR: 68.21 mL/min (ref >60.00)
Glucose, Bld: 114 mg/dL — ABNORMAL HIGH (ref 70–99)
Potassium: 4.2 mEq/L (ref 3.5–5.1)
Sodium: 137 mEq/L (ref 135–145)
Total Bilirubin: 0.4 mg/dL (ref 0.2–1.2)
Total Protein: 6.6 g/dL (ref 6.0–8.3)

## 2022-06-12 LAB — BRAIN NATRIURETIC PEPTIDE: Pro B Natriuretic peptide (BNP): 38 pg/mL (ref 0.0–100.0)

## 2022-06-12 NOTE — Assessment & Plan Note (Signed)
EKG today seems consistent with prior EKG which was done during a benzodiazepine overdose.  Not currently taking this medication.  She is having shortness of breath pending work-up, appreciate cardiology support for additional evaluation.

## 2022-06-13 ENCOUNTER — Other Ambulatory Visit: Payer: Self-pay | Admitting: Family Medicine

## 2022-06-13 ENCOUNTER — Encounter: Payer: Self-pay | Admitting: Family Medicine

## 2022-06-13 DIAGNOSIS — J189 Pneumonia, unspecified organism: Secondary | ICD-10-CM

## 2022-06-13 DIAGNOSIS — R0602 Shortness of breath: Secondary | ICD-10-CM

## 2022-06-13 MED ORDER — PREDNISONE 20 MG PO TABS: 40.0000 mg | ORAL_TABLET | Freq: Every day | ORAL | 0 refills | Status: AC

## 2022-06-13 MED ORDER — AMOXICILLIN-POT CLAVULANATE 875-125 MG PO TABS: 1.0000 | ORAL_TABLET | Freq: Two times a day (BID) | ORAL | 0 refills | Status: AC

## 2022-06-14 DIAGNOSIS — J189 Pneumonia, unspecified organism: Secondary | ICD-10-CM

## 2022-06-20 ENCOUNTER — Telehealth: Payer: Medicare Other

## 2022-06-21 ENCOUNTER — Ambulatory Visit (INDEPENDENT_AMBULATORY_CARE_PROVIDER_SITE_OTHER): Payer: Medicare Other | Admitting: Primary Care

## 2022-06-21 ENCOUNTER — Encounter: Payer: Self-pay | Admitting: Primary Care

## 2022-06-21 VITALS — BP 118/74 | HR 115 | Temp 98.6°F | Ht 64.5 in | Wt 233.0 lb

## 2022-06-21 DIAGNOSIS — R000 Tachycardia, unspecified: Secondary | ICD-10-CM | POA: Diagnosis not present

## 2022-06-21 DIAGNOSIS — R0602 Shortness of breath: Secondary | ICD-10-CM

## 2022-06-21 DIAGNOSIS — R600 Localized edema: Secondary | ICD-10-CM | POA: Diagnosis not present

## 2022-06-21 NOTE — Assessment & Plan Note (Signed)
Improving.   She does have borderline cardiomegaly from recent chest x-ray. Reviewed echocardiogram from 2021, will repeat.   Discussed the need for weight loss, elevate legs when resting, avoid salty foods.

## 2022-06-21 NOTE — Progress Notes (Signed)
Subjective:    Patient ID: Denise [name], female    DOB: 08/17/1951, 71 y.o.   MRN: [id number]  HPI  Denise [name] is a very pleasant 71 y.o. female with a history of migraines, hypertension, hypothyroidism, prediabetes, hyperlipidemia, left bundle branch block, anxiety and depression who presents today to discuss shortness of breath and cough.  Evaluated by Dr. Einar Pheasant on 06/12/2022 for symptoms of bilateral lower extremity edema, intermittent, sometimes with redness, improved with elevation.  She also endorsed symptoms of postnasal drip, productive cough, hemoptysis, shortness of breath, wheezing.  During her visit with Dr. Einar Pheasant she underwent EKG which was unchanged from prior EKG, no acute findings.  She was referred to cardiology for ongoing dyspnea.  She underwent lab work including BNP, CBC, CMP (leukocytosis, normal BNP), chest xray (borderline cardiomegaly, bilateral pulmonary interstitial prominence, pneumonitis cannot be excluded).She was noted to be tachycardic but suspicion for PE was ruled out.  She was prescribed amoxicillin-clavulanate twice daily x7 days, and prednisone course.  Today she continues to feel the same. She has not noticed any improved. She has an appointment with pulmonology for early August 2023. She is working to get scheduled with cardiology.   She continues to experience exertional dyspnea that she initially noticed in February 2023 while walking her dog. She feels shortness of breath with ADL's, walking, household chores. Her lower extremity edema has improved somewhat. She mostly notices swelling after 1 hour of activity on her feet.   She continues to notice palpitations, especially with exertion. She denies long travel. She admits to being more sedentary since she retired. She has never smoked. She does notice chest pressure with deep inspiration at times. She is managed on 81 mg of aspirin daily.   Evaluated by ENT a few weeks ago for persistent  sinus pressure, choking with meals, and postnasal drip. She is pending a swallow study.    Review of Systems  Constitutional:  Negative for chills and fever.  HENT:  Positive for postnasal drip and trouble swallowing.   Respiratory:  Positive for cough and shortness of breath.   Cardiovascular:        Chest pressure with deep inspiration          Past Medical History:  Diagnosis Date   Allergy    seasonal   Arthritis    Bursitis of hip    Left   Constipation    90%of the time c/o constipation, occ has diarrhea - uses stool softener    Endometriosis    Facial flushing 05/12/2014   Fibroid    GERD (gastroesophageal reflux disease)    Hematochezia    Hiatal hernia    HSV-1 (herpes simplex virus 1) infection    Hyperlipidemia    Hyperplastic colon polyp    Hypertension    Hypothyroidism    Ischemic colitis ([place]) 01/2019   LBBB (left bundle branch block)    Low back pain    Migraine    Osteopenia 2008   -1.2 FEMORAL NECK   Prediabetes    Stroke [facility])    TIA   TIA (transient ischemic attack) 08-10-2013    Social History   Socioeconomic History   Marital status: Divorced    Spouse name: Not on file   Number of children: 2   Years of education: Not on file   Highest education level: Not on file  Occupational History    Employer: RETIRED  Tobacco Use   Smoking status: Never   Smokeless  tobacco: Never  Vaping Use   Vaping Use: Never used  Substance and Sexual Activity   Alcohol use: Yes    Alcohol/week: 0.0 standard drinks of alcohol    Comment: 1 every 3 months   Drug use: No   Sexual activity: Not Currently    Comment: intercourse age 81, more than 5 sexual partners,des neg  Other Topics Concern   Not on file  Social History Narrative   Single.   2 daughters, 4 grandchildren.   Once worked for [employer].   Enjoys Ambulance person.    Social Determinants of Health   Financial Resource Strain: Low Risk  (12/28/2021)    Overall Financial Resource Strain (CARDIA)    Difficulty of Paying Living Expenses: Not hard at all  Food Insecurity: No Food Insecurity (01/26/2019)   Hunger Vital Sign    Worried About Running Out of Food in the Last Year: Never true    Ran Out of Food in the Last Year: Never true  Transportation Needs: No Transportation Needs (01/26/2019)   PRAPARE - Hydrologist (Medical): No    Lack of Transportation (Non-Medical): No  Physical Activity: Unknown (01/26/2019)   Exercise Vital Sign    Days of Exercise per Week: Patient refused    Minutes of Exercise per Session: Patient refused  Stress: No Stress Concern Present (01/26/2019)   [employer] [facility] [place]    Feeling of Stress : Not at all  Social Connections: Not on file  Intimate Partner Violence: Not on file    Past Surgical History:  Procedure Laterality Date   [place], [place]  2003   COLONOSCOPY     KNEE SURGERY  2004   right   NASAL SEPTUM SURGERY  2012   PITUITARY SURGERY  2001   POLYPECTOMY     SHOULDER SURGERY  2007   right   THYROID LOBECTOMY  2002    Family History  Problem Relation Age of Onset   Heart disease Father    Cancer Mother        brain tumor   Breast cancer Maternal Aunt    Parkinson's disease Sister    Colon cancer Neg Hx    Colon polyps Neg Hx    Rectal cancer Neg Hx    Stomach cancer Neg Hx    Thyroid disease Neg Hx    Esophageal cancer Neg Hx     Allergies  Allergen Reactions   Tramadol Other (See Comments)    Reaction: NIGHTMARES    Current Outpatient Medications on File Prior to Visit  Medication Sig Dispense Refill   amLODipine (NORVASC) 10 MG tablet Take 1 tablet (10 mg total) by mouth daily. for blood pressure. Due in July for office visit. 90 tablet 0   aspirin 81 MG tablet Take 81 mg by mouth daily.     atorvastatin (LIPITOR) 10 MG tablet TAKE 1 TABLET BY  MOUTH  DAILY FOR CHOLESTEROL 90 tablet 3   buPROPion ER (WELLBUTRIN SR) 100 MG 12 hr tablet TAKE 1 TABLET (100 MG TOTAL) BY MOUTH 2 (TWO) TIMES DAILY. FOR DEPRESSION 180 tablet 0   Cholecalciferol (D3) 10 MCG (400 UNIT) CHEW Chew by mouth.     DULoxetine (CYMBALTA) 60 MG capsule TAKE 1 CAPSULE BY MOUTH  DAILY FOR HEADACHE  PREVENTION 90 capsule 3   esomeprazole (NEXIUM) 20 MG capsule Take 1 capsule (  20 mg total) by mouth every morning. 30 capsule 11   levothyroxine (SYNTHROID) 75 MCG tablet TAKE 1 TABLET BY MOUTH IN  THE MORNING ON AN EMPTY  STOMACH WITH WATER ONLY. NO FOOD OR OTHER MEDICATIONS  FOR 1/2 HOUR 90 tablet 3   magnesium 30 MG tablet Take 30 mg by mouth daily.     metoprolol succinate (TOPROL-XL) 25 MG 24 hr tablet TAKE 1 TABLET BY MOUTH  DAILY FOR BLOOD PRESSURE 90 tablet 0   mirtazapine (REMERON) 7.5 MG tablet Take 1 tablet (7.5 mg total) by mouth at bedtime. For sleep 90 tablet 0   rizatriptan (MAXALT) 5 MG tablet Take 1 tablet by mouth at migraine onset. May repeat with 1 tablet in 2 hours if needed. (Patient taking differently: Take 5 mg by mouth daily as needed for migraine.) 10 tablet 0   No current facility-administered medications on file prior to visit.    BP 118/74   Pulse (!) 115   Temp 98.6 F (37 C) (Oral)   Ht 5' 4.5" (1.638 m)   Wt 233 lb (105.7 kg)   SpO2 98%   BMI 39.38 kg/m  Objective:   Physical Exam Constitutional:      General: She is not in acute distress.    Appearance: She is not ill-appearing.  Cardiovascular:     Rate and Rhythm: Regular rhythm. Tachycardia present.     Comments: HR ranged 115-120 with ambulation in clinic.  She did have to sit and rest.  Pulmonary:     Effort: Pulmonary effort is normal.     Breath sounds: Normal breath sounds. No wheezing or rhonchi.     Comments: No cough during exam.  Oxygen saturation with ambulation in clinic ranged 95%-97% Chest:     Chest wall: No tenderness.  Musculoskeletal:     Cervical back:  Neck supple.  Skin:    General: Skin is warm and dry.           Assessment & Plan:   Problem List Items Addressed This Visit       Other   Shortness of breath - Primary    No improvement with antibiotic course. This is not an infectious process.   Fortunately she was not hypoxic with ambulation around our clinic today. She did have to sit and rest.   Reviewed chest xray and labs from visit last week.  Echocardiogram ordered and pending. Checking D-dimer to rule out PE, especially in the setting of tachycardia, dyspnea, chest pressure with inspiration.   Follow up with pulmonology and cardiology  Strict ED precautions provided.       Relevant Orders   D-dimer, quantitative   ECHOCARDIOGRAM COMPLETE   Bilateral leg edema    Improving.   She does have borderline cardiomegaly from recent chest x-ray. Reviewed echocardiogram from 2021, will repeat.   Discussed the need for weight loss, elevate legs when resting, avoid salty foods.      Relevant Orders   ECHOCARDIOGRAM COMPLETE   Other Visit Diagnoses     Sinus tachycardia       Relevant Orders   D-dimer, quantitative          Pleas Koch, NP

## 2022-06-21 NOTE — Patient Instructions (Signed)
You will be contacted regarding your echocardiogram.  Please let us know if you have not been contacted within two weeks.   Please schedule a visit with cardiology.  Stop by the lab prior to leaving today. I will notify you of your results once received.   It was a pleasure to see you today!

## 2022-06-21 NOTE — Assessment & Plan Note (Addendum)
No improvement with antibiotic course. This is not an infectious process.   Fortunately she was not hypoxic with ambulation around our clinic today. She did have to sit and rest.   Reviewed chest xray and labs from visit last week.  Echocardiogram ordered and pending. Checking D-dimer to rule out PE, especially in the setting of tachycardia, dyspnea, chest pressure with inspiration.   Follow up with pulmonology and cardiology  Strict ED precautions provided.

## 2022-06-22 ENCOUNTER — Other Ambulatory Visit: Payer: Self-pay | Admitting: Primary Care

## 2022-06-22 ENCOUNTER — Telehealth: Payer: Self-pay

## 2022-06-22 ENCOUNTER — Ambulatory Visit
Admission: RE | Admit: 2022-06-22 | Discharge: 2022-06-22 | Disposition: A | Discharge status: Home / Self Care | Payer: Medicare Other | Source: Ambulatory Visit | Attending: Primary Care | Admitting: Primary Care

## 2022-06-22 DIAGNOSIS — R000 Tachycardia, unspecified: Secondary | ICD-10-CM | POA: Diagnosis not present

## 2022-06-22 DIAGNOSIS — R600 Localized edema: Secondary | ICD-10-CM

## 2022-06-22 DIAGNOSIS — R071 Chest pain on breathing: Secondary | ICD-10-CM | POA: Insufficient documentation

## 2022-06-22 DIAGNOSIS — M7989 Other specified soft tissue disorders: Secondary | ICD-10-CM | POA: Diagnosis not present

## 2022-06-22 DIAGNOSIS — R0602 Shortness of breath: Secondary | ICD-10-CM

## 2022-06-22 DIAGNOSIS — I517 Cardiomegaly: Secondary | ICD-10-CM

## 2022-06-22 LAB — D-DIMER, QUANTITATIVE: D-Dimer, Quant: 0.56 mcg/mL FEU — ABNORMAL HIGH (ref <0.50)

## 2022-06-22 MED ORDER — IOHEXOL 350 MG/ML SOLN
75.0000 mL | Freq: Once | INTRAVENOUS | Status: AC | PRN
  Administered 2022-06-22: 75 mL via INTRAVENOUS

## 2022-06-22 MED ORDER — FUROSEMIDE 20 MG PO TABS: 20.0000 mg | ORAL_TABLET | Freq: Every day | ORAL | 0 refills | Status: DC

## 2022-06-22 NOTE — Telephone Encounter (Signed)
[place] Night - Client TELEPHONE ADVICE RECORD Access[employer]urse Patient Name: Denise [name] Gender: Female DOB: [birth year] Age: 71 Y 11 M 8 D Return Phone Number: [phone] (Primary) Address: City/ State/ Zip: [place] [address] Client [place] Night - Client Client Site [place] - Night Contact Type Call Who Is Calling Lab / Radiology Lab Name [facility] Phone Number [phone] Lab Tech Name Shirlean Mylar Lab Reference Number Doren Custard [id number] Chief Complaint Lab Result (Critical or Stat) Call Type Lab Send to RN Reason for Call Report lab results Initial Comment Robin with [facility] with Critical Result Translation No Nurse Assessment Nurse: Hassell Done, RN, Joelene Millin Date/Time Eilene Ghazi Time): 06/22/2022 7:34:33 AM Is there an on-call provider listed? ---Yes Please list name of person reporting value (Lab Employee) and a contact number. ---tiffany [phone] Please document the following items: Lab name Lab value (read back to lab to verify) Reference range for lab value Date and time blood was drawn Collect time of birth for bilirubin results ---d dimer 0.56 collected 06/21/22 @ 10:01am Please collect the patient contact information from the lab. (name, phone number and address) ---[phone] Disp. Time Eilene Ghazi Time) Disposition Final User 06/22/2022 7:39:09 AM Called On-Call Provider Hassell Done, RN, [facility] 06/22/2022 7:42:23 AM Clinical Call Yes Hassell Done RN, Joelene Millin Final Disposition 06/22/2022 7:42:23 AM Clinical Call Yes Hassell Done, RN, Joelene Millin Comments User: Lorenza Burton, RN Date/Time Eilene Ghazi Time): 06/22/2022 7:42:03 AM critical value reported to [facility]. aware.no further action PLEASE NOTE: All timestamps contained within this report are represented as [place] Standard Time. CONFIDENTIALTY NOTICE: This fax transmission is intended only for the addressee. It contains information that  is legally privileged, confidential or otherwise protected from use or disclosure. If you are not the intended recipient, you are strictly prohibited from reviewing, disclosing, copying using or disseminating any of this information or taking any action in reliance on or regarding this information. If you have received this fax in error, please notify us immediately by telephone so that we can arrange for its return to [place]. Phone: [phone], Toll-Free: [phone], Fax: [phone] Page: 2 of 2 Call Id: [id number] Paging DoctorName Phone DateTime Result/ Outcome Message Type Notes Kathlene November - MD [phone] 06/22/2022 7:39:09 AM Called On Call Provider - Reached Doctor Paged Kathlene November - MD 06/22/2022 7:41:28 AM Spoke with On Call - General Message Resul

## 2022-06-22 NOTE — Telephone Encounter (Signed)
Per lab result note [name] NP has already addressed and pt has been notified by [facility] CMA.

## 2022-06-27 ENCOUNTER — Encounter: Payer: Medicare Other | Admitting: Primary Care

## 2022-07-02 ENCOUNTER — Ambulatory Visit
Admission: RE | Admit: 2022-07-02 | Discharge: 2022-07-02 | Disposition: A | Discharge status: Home / Self Care | Payer: Medicare Other | Source: Ambulatory Visit | Attending: Otolaryngology | Admitting: Otolaryngology

## 2022-07-02 ENCOUNTER — Other Ambulatory Visit: Payer: Self-pay | Admitting: Speech Pathology

## 2022-07-02 DIAGNOSIS — R1310 Dysphagia, unspecified: Secondary | ICD-10-CM | POA: Insufficient documentation

## 2022-07-02 NOTE — Therapy (Signed)
[facility] [place], [place], [address] Phone: [phone]   Fax:     Modified Barium Swallow  Patient Details  Name: Denise [name] MRN: [id number] Date of Birth: 06/25/1951 No data recorded  Encounter Date: 07/02/2022   End of Session - 07/02/22 1405     Visit Number 1    Number of Visits 1    Date for SLP Re-Evaluation 07/02/22    SLP Start Time 1310    SLP Stop Time  1345    SLP Time Calculation (min) 35 min    Activity Tolerance Patient tolerated treatment well             Past Medical History:  Diagnosis Date   Allergy    seasonal   Arthritis    Bursitis of hip    Left   Constipation    90%of the time c/o constipation, occ has diarrhea - uses stool softener    Endometriosis    Facial flushing 05/12/2014   Fibroid    GERD (gastroesophageal reflux disease)    Hematochezia    Hiatal hernia    HSV-1 (herpes simplex virus 1) infection    Hyperlipidemia    Hyperplastic colon polyp    Hypertension    Hypothyroidism    Ischemic colitis ([place]) 01/2019   LBBB (left bundle branch block)    Low back pain    Migraine    Osteopenia 2008   -1.2 FEMORAL NECK   Prediabetes    Stroke [facility])    TIA   TIA (transient ischemic attack) 08-10-2013    Past Surgical History:  Procedure Laterality Date   ABDOMINAL HYSTERECTOMY  1989   TAH, BSO   CHOLECYSTECTOMY  2003   COLONOSCOPY     KNEE SURGERY  2004   right   NASAL SEPTUM SURGERY  2012   PITUITARY SURGERY  2001   POLYPECTOMY     SHOULDER SURGERY  2007   right   THYROID LOBECTOMY  2002    There were no vitals filed for this visit.   Dysphagia, unspecified type - Plan: DG SWALLOW FUNC OP MEDICARE SPEECH PATH, DG SWALLOW FUNC OP MEDICARE SPEECH PATH      07/02/22 1400  SLP Visit Information  SLP Received On 07/02/22  Subjective  Subjective Pt reports she choked on rice  Patient/Family Stated Goal know what's causing her  swallowing difficulty  Pain Assessment  Pain Assessment No/denies pain  General Information  Date of Onset 06/18/22 (referral date)  HPI [name] is a 71 y.o. female with past medical history including GERD, hiatal hernia, pituitary adenoma, migraines, HTN, HLD, anxiety and depression referred by Dr. Pryor Ochoa for MBS. Patient reports worsening reflux over the past several months and feeling things stick in her throat, with coughing, choking sensations, particularly "doughy" textured foods, meats, and rice. Pt was following with GI but per chart review has not been seen since 2021. EGD 09/10/2012: "1. Flat polyp ranging between 3-60m in size was found in the middle third of the esophagus. 2. 2cm hiatal hernia, sliding reducible hernia with irregular z-line, biopsies taken to r/o Barrett's esophagus. 3. Gastritis (inflammation) was found in the gastric antrum; biopsy, minimal erythema. Bx's to r/o H.pylori."  Type of Study MBS-Modified Barium Swallow Study  Previous Swallow Assessment see HPI  Diet Prior to this Study Regular;Thin liquids  Temperature Spikes Noted No  Respiratory Status Room air  History of Recent Intubation No  Behavior/Cognition Alert;Cooperative;Pleasant  mood  Oral Cavity Assessment WFL  Oral Care Completed by SLP No  Oral Cavity - Dentition Adequate natural dentition  Vision Functional for self feeding  Self-Feeding Abilities Able to feed self  Patient Positioning Upright in chair  Baseline Vocal Quality Hoarse  Volitional Cough Strong  Volitional Swallow Able to elicit  Anatomy WFL (osteophyte C6-7 noted on prior CT scan)  Pharyngeal Secretions Not observed secondary MBS  Oral Motor/Sensory Function  Overall Oral Motor/Sensory Function WFL  Oral Preparation/Oral Phase  Oral Phase WFL (occasional hesitation with thicker textures)  Pharyngeal Phase  Pharyngeal Phase WFL  Pharyngeal - Nectar  Pharyngeal- Nectar Teaspoon WFL  Pharyngeal Material does not enter  airway  Pharyngeal- Nectar Cup [facility]  Pharyngeal Material does not enter airway  Pharyngeal - Thin  Pharyngeal- Thin Teaspoon WFL  Pharyngeal Material does not enter airway  Pharyngeal- Thin Cup [facility]  Pharyngeal Material does not enter airway  Pharyngeal - Solids  Pharyngeal- Puree WFL  Pharyngeal Material does not enter airway  Pharyngeal- Regular WFL  Pharyngeal Material does not enter airway  Pharyngeal- Pill WFL  Pharyngeal Material does not enter airway  Cervical Esophageal Phase  Cervical Esophageal Phase Impaired  Cervical Esophageal Phase - Nectar  Nectar Cup [facility]  Cervical Esophageal Phase - Thin  Thin Cup WFL  Cervical Esophageal Phase - Solids  Puree Other (Comment) (appearance of slow clearance)  Regular Other (Comment) (appearance of slow clearance)  Pill Other (Comment) (appearance of slow clearance)  Clinical Impression  Clinical Impression Patient presents with normal oropharyngeal swallow and suspected primary esophageal dysphagia. Oral stage is characterized by adequate lip closure, bolus preparation, and anterior to posterior transit. There is complete oral clearance. Swallow initiation occurs at the level of the valleculae. Brief hesitation in the valleculae with solids and 33m barium tablet as pt appears to anticipate difficulty, in absence of any pharyngeal weakness. Pharyngeal stage is noted for adequate tongue base retraction, hyolaryngeal excursion, and pharyngeal constriction. Epiglottic deflection is complete. There is no laryngeal penetration or tracheal aspiration. Pharyngeal stripping wave is complete with no abnormal pharyngeal residue. Amplitude/duration of pharyngoesophageal segment opening is WFL, however solids (puree, regular) had appearance of slower transit through the cervical esophagus, correlating with pt reporting globus sensation and throat-clearing. An esophageal sweep was performed in the upright lateral position as well as while standing in  anterior posterior view. Upon sweep barium and 13 mm barium tablet were noted in the distal esophagus, with some retrograde movement of contrast. Passed through the GE junction with additional sips of nectar-thick liquid. Recommend follow-up with GI; consider repeat EGD or barium swallow. Recommend patient continue regular diet with thin liquids with reflux precautions; handout and instructions provided. No further ST indicated.  SLP Visit Diagnosis Dysphagia, unspecified (R13.10)  Impact on safety and function Mild aspiration risk (due to hx of GERD)  Swallow Evaluation Recommendations  Recommended Consults Consider GI evaluation;Consider esophageal assessment  SLP Diet Recommendations Regular solids;Thin liquid  Liquid Administration via Cup;Straw  Medication Administration Whole meds with liquid  Supervision Patient able to self feed  Compensations Follow solids with liquid  Postural Changes Remain semi-upright after after feeds/meals (Comment);Seated upright at 90 degrees  Treatment Plan  Oral Care Recommendations Oral care BID  Treatment Recommendations No treatment recommended at this time  Follow Up Recommendations No SLP follow up  Individuals Consulted  Consulted and Agree with Results and Recommendations Patient  Report Sent to  Referring physician  Progression Toward Goals  Progression toward goals  Goals met, education completed, patient discharged from SLP  SLP Time Calculation  SLP Start Time (ACUTE ONLY) 1310  SLP Stop Time (ACUTE ONLY) 1345  SLP Time Calculation (min) (ACUTE ONLY) 35 min  SLP Evaluations  $ SLP Speech Visit 1 Visit  SLP Evaluations  $Outpatient MBS Swallow 1 Procedure      Problem List Patient Active Problem List   Diagnosis Date Noted   LBBB (left bundle branch block) 06/12/2022   Shortness of breath 06/12/2022   Bilateral leg edema 06/12/2022   Current severe episode of major depressive disorder without psychotic features without prior episode  ([place]) 03/07/2022   Overactive bladder 09/26/2021   Overdose of benzodiazepine 08/11/2021   Benzodiazepine intoxication ([place])    Neck pain on right side 07/31/2021   Preventative health care 06/23/2021   Sleep disturbance 06/01/2021   Anxiety and depression 11/02/2020   Dysuria 11/02/2020   Lumbosacral spondylosis with radiculopathy 10/20/2020   Breast pain, left 04/18/2020   Elevated alkaline phosphatase level 02/11/2020   Diverticulosis of colon without hemorrhage 01/21/2020   Hx of adenomatous colonic polyps 01/21/2020   Left lower quadrant abdominal pain 10/22/2019   Excessive sweating 10/13/2019   Prediabetes 10/13/2019   Hyperlipidemia 02/02/2019   Colitis 01/26/2019   Chest pain 02/25/2017   Welcome to Medicare preventive visit 08/25/2015   Essential hypertension 08/25/2015   Pituitary adenoma ([place]) 05/12/2014   GERD (gastroesophageal reflux disease) 09/01/2012   Hypothyroidism    Osteopenia    Migraine    Fibroid    HSV-1 (herpes simplex virus 1) infection    Deneise Lever, MS, [place] Speech-Language Pathologist ([place], [place] 07/02/2022, 3:34 PM  [facility] [place], [place], [address] Phone: [phone]   Fax:     Name: Dilyn Osoria MRN: [id number] Date of Birth: [birth year]

## 2022-07-03 ENCOUNTER — Other Ambulatory Visit: Payer: Self-pay | Admitting: Primary Care

## 2022-07-03 DIAGNOSIS — I10 Essential (primary) hypertension: Secondary | ICD-10-CM

## 2022-07-04 ENCOUNTER — Institutional Professional Consult (permissible substitution): Payer: Medicare Other | Admitting: Adult Health

## 2022-07-09 ENCOUNTER — Ambulatory Visit (HOSPITAL_COMMUNITY): Payer: Medicare Other | Attending: Cardiology

## 2022-07-09 DIAGNOSIS — R0602 Shortness of breath: Secondary | ICD-10-CM

## 2022-07-09 DIAGNOSIS — R600 Localized edema: Secondary | ICD-10-CM | POA: Diagnosis not present

## 2022-07-09 LAB — ECHOCARDIOGRAM COMPLETE
Area-P 1/2: 3.91 cm2
Calc EF: 55.5 %
S' Lateral: 3.1 cm
Single Plane A2C EF: 52.5 %
Single Plane A4C EF: 59.4 %

## 2022-07-12 ENCOUNTER — Encounter: Payer: Self-pay | Admitting: Primary Care

## 2022-07-12 ENCOUNTER — Ambulatory Visit (INDEPENDENT_AMBULATORY_CARE_PROVIDER_SITE_OTHER): Payer: Medicare Other | Admitting: Primary Care

## 2022-07-12 DIAGNOSIS — R600 Localized edema: Secondary | ICD-10-CM

## 2022-07-12 DIAGNOSIS — R0602 Shortness of breath: Secondary | ICD-10-CM

## 2022-07-12 DIAGNOSIS — E785 Hyperlipidemia, unspecified: Secondary | ICD-10-CM

## 2022-07-12 LAB — LIPID PANEL
Cholesterol: 110 mg/dL (ref 0–200)
HDL: 42.6 mg/dL (ref >39.00)
LDL Cholesterol: 34 mg/dL (ref 0–99)
NonHDL: 67.08
Total CHOL/HDL Ratio: 3
Triglycerides: 163 mg/dL — ABNORMAL HIGH (ref 0.0–149.0)
VLDL: 32.6 mg/dL (ref 0.0–40.0)

## 2022-07-12 NOTE — Assessment & Plan Note (Addendum)
Noted on exam today.  No improvement with loop diuretic. Amlodipine could very well be contributing.  Discussed this with patient today.  Reviewed echocardiogram results with patient today.  Follow-up with cardiology as scheduled.

## 2022-07-12 NOTE — Progress Notes (Signed)
Subjective:    Patient ID: Denise [name], female    DOB: 1951-07-02, 71 y.o.   MRN: [id number]  HPI  Denise [name] is a very pleasant 71 y.o. female with a history of hypertension, migraines, hypothyroidism, overactive bladder, prediabetes, hyperlipidemia, anxiety depression, who presents today to discuss CT scan results and follow-up of exertional dyspnea.   She underwent CT Angio Chest on 06/22/2022 for symptoms of exertional dyspnea, chest pain with positive D-dimer.  CT scan was negative for pulmonary embolism.  Her CT scan did note evidence of mild cardiomegaly, mild groundglass attenuation of the lung parenchyma which was suspected to be secondary to air trapping or pulmonary vascular congestion. She was also noted to have aortic atherosclerosis.   Given her symptoms, coupled with CT findings, we treated her with furosemide 20 mg daily x5 days. She underwent echocardiogram last week which shows LVEF of 55-60%, normal wall motion, normal right ventricular systolic function, valves grossly unremarkable.  It was noted that she has mild dilation of the ascending aorta at 3.7 cm.  Since her last visit she completed her course of furosemide daily. She did not notice any improvement in her exertional dyspnea or lower extremity swelling. She feels about the same as she did last month.   She has appointments scheduled with both cardiology and pulmonology for the near future. She questions if she needs to keep these appointments.    Review of Systems  Constitutional:  Positive for fatigue.  Respiratory:  Positive for shortness of breath.   Cardiovascular:  Positive for leg swelling. Negative for chest pain.  Neurological:  Negative for dizziness and headaches.         Past Medical History:  Diagnosis Date   Allergy    seasonal   Arthritis    Bursitis of hip    Left   Constipation    90%of the time c/o constipation, occ has diarrhea - uses stool softener    Endometriosis     Facial flushing 05/12/2014   Fibroid    GERD (gastroesophageal reflux disease)    Hematochezia    Hiatal hernia    HSV-1 (herpes simplex virus 1) infection    Hyperlipidemia    Hyperplastic colon polyp    Hypertension    Hypothyroidism    Ischemic colitis ([place]) 01/2019   LBBB (left bundle branch block)    Low back pain    Migraine    Osteopenia 2008   -1.2 FEMORAL NECK   Prediabetes    Stroke [facility])    TIA   TIA (transient ischemic attack) 08-10-2013    Social History   Socioeconomic History   Marital status: Divorced    Spouse name: Not on file   Number of children: 2   Years of education: Not on file   Highest education level: Not on file  Occupational History    Employer: RETIRED  Tobacco Use   Smoking status: Never   Smokeless tobacco: Never  Vaping Use   Vaping Use: Never used  Substance and Sexual Activity   Alcohol use: Yes    Alcohol/week: 0.0 standard drinks of alcohol    Comment: 1 every 3 months   Drug use: No   Sexual activity: Not Currently    Comment: intercourse age 55, more than 5 sexual partners,des neg  Other Topics Concern   Not on file  Social History Narrative   Single.   2 daughters, 4 grandchildren.   Once worked for [employer].  Enjoys Ambulance person.    Social Determinants of Health   Financial Resource Strain: Low Risk  (12/28/2021)   Overall Financial Resource Strain (CARDIA)    Difficulty of Paying Living Expenses: Not hard at all  Food Insecurity: No Food Insecurity (01/26/2019)   Hunger Vital Sign    Worried About Running Out of Food in the Last Year: Never true    Ran Out of Food in the Last Year: Never true  Transportation Needs: No Transportation Needs (01/26/2019)   PRAPARE - Hydrologist (Medical): No    Lack of Transportation (Non-Medical): No  Physical Activity: Unknown (01/26/2019)   Exercise Vital Sign    Days of Exercise per Week: Patient refused    Minutes  of Exercise per Session: Patient refused  Stress: No Stress Concern Present (01/26/2019)   [employer] [place]    Feeling of Stress : Not at all  Social Connections: Not on file  Intimate Partner Violence: Not on file    Past Surgical History:  Procedure Laterality Date   [place], [place]  2003   COLONOSCOPY     KNEE SURGERY  2004   right   NASAL SEPTUM SURGERY  2012   PITUITARY SURGERY  2001   POLYPECTOMY     SHOULDER SURGERY  2007   right   THYROID LOBECTOMY  2002    Family History  Problem Relation Age of Onset   Heart disease Father    Cancer Mother        brain tumor   Breast cancer Maternal Aunt    Parkinson's disease Sister    Colon cancer Neg Hx    Colon polyps Neg Hx    Rectal cancer Neg Hx    Stomach cancer Neg Hx    Thyroid disease Neg Hx    Esophageal cancer Neg Hx     Allergies  Allergen Reactions   Tramadol Other (See Comments)    Reaction: NIGHTMARES    Current Outpatient Medications on File Prior to Visit  Medication Sig Dispense Refill   amLODipine (NORVASC) 10 MG tablet TAKE 1 TABLET BY MOUTH DAILY FOR BLOOD PRESSURE 90 tablet 3   aspirin 81 MG tablet Take 81 mg by mouth daily.     atorvastatin (LIPITOR) 10 MG tablet TAKE 1 TABLET BY MOUTH  DAILY FOR CHOLESTEROL 90 tablet 3   buPROPion ER (WELLBUTRIN SR) 100 MG 12 hr tablet TAKE 1 TABLET (100 MG TOTAL) BY MOUTH 2 (TWO) TIMES DAILY. FOR DEPRESSION 180 tablet 0   Cholecalciferol (D3) 10 MCG (400 UNIT) CHEW Chew by mouth.     DULoxetine (CYMBALTA) 60 MG capsule TAKE 1 CAPSULE BY MOUTH  DAILY FOR HEADACHE  PREVENTION 90 capsule 3   esomeprazole (NEXIUM) 20 MG capsule Take 1 capsule (20 mg total) by mouth every morning. 30 capsule 11   levothyroxine (SYNTHROID) 75 MCG tablet TAKE 1 TABLET BY MOUTH IN  THE MORNING ON AN EMPTY  STOMACH WITH WATER ONLY. NO FOOD OR OTHER MEDICATIONS  FOR 1/2 HOUR 90 tablet 3    magnesium 30 MG tablet Take 30 mg by mouth daily.     metoprolol succinate (TOPROL-XL) 25 MG 24 hr tablet TAKE 1 TABLET BY MOUTH  DAILY FOR BLOOD PRESSURE 90 tablet 0   mirtazapine (REMERON) 7.5 MG tablet Take 1 tablet (7.5 mg total) by mouth at bedtime. For sleep 90  tablet 0   rizatriptan (MAXALT) 5 MG tablet Take 1 tablet by mouth at migraine onset. May repeat with 1 tablet in 2 hours if needed. (Patient taking differently: Take 5 mg by mouth daily as needed for migraine.) 10 tablet 0   No current facility-administered medications on file prior to visit.    BP 118/74   Pulse (!) 110   Temp 98.6 F (37 C) (Oral)   Ht 5' 4.5" (1.638 m)   Wt 233 lb (105.7 kg)   SpO2 97%   BMI 39.38 kg/m  Objective:   Physical Exam Cardiovascular:     Rate and Rhythm: Normal rate and regular rhythm.     Comments: Bilateral lower extremity swelling, left worse than right.  Pulmonary:     Effort: Pulmonary effort is normal.     Breath sounds: Normal breath sounds.  Musculoskeletal:     Cervical back: Neck supple.  Skin:    General: Skin is warm and dry.           Assessment & Plan:   Problem List Items Addressed This Visit       Other   Hyperlipidemia    Continue atorvastatin 10 mg daily. Repeat lipid panel pending.      Relevant Orders   Lipid panel   Shortness of breath    Reviewed CT angio chest and echocardiogram results with patient today.  Follow-up with cardiology and pulmonology as scheduled.      Bilateral leg edema    Noted on exam today.  No improvement with loop diuretic. Amlodipine could very well be contributing.  Discussed this with patient today.  Reviewed echocardiogram results with patient today.  Follow-up with cardiology as scheduled.          Pleas Koch, NP

## 2022-07-12 NOTE — Patient Instructions (Signed)
Stop by the lab prior to leaving today. I will notify you of your results once received.   Follow-up with cardiology and pulmonology as scheduled.  It was a pleasure to see you today!

## 2022-07-12 NOTE — Assessment & Plan Note (Signed)
Reviewed CT angio chest and echocardiogram results with patient today.  Follow-up with cardiology and pulmonology as scheduled.

## 2022-07-12 NOTE — Assessment & Plan Note (Signed)
Continue atorvastatin 10 mg daily. Repeat lipid panel pending. 

## 2022-07-15 ENCOUNTER — Encounter: Payer: Self-pay | Admitting: Cardiovascular Disease

## 2022-07-15 NOTE — Progress Notes (Unsigned)
Cardiology Office Note:    Date:  07/16/2022   ID:  Denise [name], DOB [birth year], MRN [id number]  PCP:  Pleas Koch, NP   [facility] Providers Cardiologist:  None     Referring MD: Lesleigh Noe, MD   Chief Complaint  Patient presents with   Shortness of Breath       Problem list   LBBB Dyspnea TIA   History of Present Illness:    Denise [name] is a 71 y.o. female with a hx of obesity, dyspnea, TIA, LBBB,   We were asked to see her by [name] for further evaluation of her shortness of breath   Echocardiogram from July 09, 2022 reveals normal LVEF of 55 to 60%. She has no significant valvular disease.  There is trivial  tricuspid regurgitation.  Has had LBBB 3-4 years ago Runs out of breath while walking and while dressing  Unable to take a deep breath  ( Has pulmonary appt in Aug)   Leg edema a year ago.  Edema is gradually worsening  She took lasix for 5 days ( does not remember the dose )  Does not think it helped.   Has 2 pillow orthopnea  Worsening over the past several months   25 lb weight gain over the past year Has not been able to walk much   Does not add much salt.  Is recently trying to avoid processed food   + fam hx of CAD Father had MI in mid 80's  Grandmother had stroke    Review of the labs from her primary medical doctor from June 27 shows a white blood cell count of 13.7.  Chest x-ray revealed diffuse infiltrates. CT scan showed ground glass appearance   Past Medical History:  Diagnosis Date   Allergy    seasonal   Arthritis    Bursitis of hip    Left   Constipation    90%of the time c/o constipation, occ has diarrhea - uses stool softener    Endometriosis    Facial flushing 05/12/2014   Fibroid    GERD (gastroesophageal reflux disease)    Hematochezia    Hiatal hernia    HSV-1 (herpes simplex virus 1) infection    Hyperlipidemia    Hyperplastic colon polyp    Hypertension     Hypothyroidism    Ischemic colitis ([place]) 01/2019   LBBB (left bundle branch block)    Low back pain    Migraine    Osteopenia 2008   -1.2 FEMORAL NECK   Prediabetes    Stroke [facility])    TIA   TIA (transient ischemic attack) 08-10-2013    Past Surgical History:  Procedure Laterality Date   ABDOMINAL HYSTERECTOMY  1989   TAH, BSO   CHOLECYSTECTOMY  2003   COLONOSCOPY     KNEE SURGERY  2004   right   NASAL SEPTUM SURGERY  2012   PITUITARY SURGERY  2001   POLYPECTOMY     SHOULDER SURGERY  2007   right   THYROID LOBECTOMY  2002    Current Medications: Current Meds  Medication Sig   aspirin 81 MG tablet Take 81 mg by mouth daily.   atorvastatin (LIPITOR) 10 MG tablet TAKE 1 TABLET BY MOUTH  DAILY FOR CHOLESTEROL   buPROPion ER (WELLBUTRIN SR) 100 MG 12 hr tablet TAKE 1 TABLET (100 MG TOTAL) BY MOUTH 2 (TWO) TIMES DAILY. FOR DEPRESSION   Cholecalciferol (D3) 10 MCG (400  UNIT) CHEW Chew by mouth.   DULoxetine (CYMBALTA) 60 MG capsule TAKE 1 CAPSULE BY MOUTH  DAILY FOR HEADACHE  PREVENTION   empagliflozin (JARDIANCE) 10 MG TABS tablet Take 1 tablet (10 mg total) by mouth daily before breakfast.   esomeprazole (NEXIUM) 20 MG capsule Take 1 capsule (20 mg total) by mouth every morning.   levothyroxine (SYNTHROID) 75 MCG tablet TAKE 1 TABLET BY MOUTH IN  THE MORNING ON AN EMPTY  STOMACH WITH WATER ONLY. NO FOOD OR OTHER MEDICATIONS  FOR 1/2 HOUR   magnesium 30 MG tablet Take 30 mg by mouth daily.   metoprolol succinate (TOPROL-XL) 25 MG 24 hr tablet TAKE 1 TABLET BY MOUTH  DAILY FOR BLOOD PRESSURE   mirtazapine (REMERON) 7.5 MG tablet Take 1 tablet (7.5 mg total) by mouth at bedtime. For sleep   potassium chloride (KLOR-CON M) 10 MEQ tablet Take 1 tablet (10 mEq total) by mouth 2 (two) times daily.   rizatriptan (MAXALT) 5 MG tablet Take 1 tablet by mouth at migraine onset. May repeat with 1 tablet in 2 hours if needed. (Patient taking differently: Take 5 mg by mouth daily as needed for  migraine.)   sacubitril-valsartan (ENTRESTO) 49-51 MG Take 1 tablet by mouth 2 (two) times daily.   torsemide (DEMADEX) 20 MG tablet Take 1 tablet (20 mg total) by mouth daily.   [DISCONTINUED] amLODipine (NORVASC) 10 MG tablet TAKE 1 TABLET BY MOUTH DAILY FOR BLOOD PRESSURE     Allergies:   Tramadol   Social History   Socioeconomic History   Marital status: Divorced    Spouse name: Not on file   Number of children: 2   Years of education: Not on file   Highest education level: Not on file  Occupational History    Employer: RETIRED  Tobacco Use   Smoking status: Never   Smokeless tobacco: Never  Vaping Use   Vaping Use: Never used  Substance and Sexual Activity   Alcohol use: Yes    Alcohol/week: 0.0 standard drinks of alcohol    Comment: 1 every 3 months   Drug use: No   Sexual activity: Not Currently    Comment: intercourse age 27, more than 5 sexual partners,des neg  Other Topics Concern   Not on file  Social History Narrative   Single.   2 daughters, 4 grandchildren.   Once worked for [employer].   Enjoys Ambulance person.    Social Determinants of Health   Financial Resource Strain: Low Risk  (12/28/2021)   Overall Financial Resource Strain (CARDIA)    Difficulty of Paying Living Expenses: Not hard at all  Food Insecurity: No Food Insecurity (01/26/2019)   Hunger Vital Sign    Worried About Running Out of Food in the Last Year: Never true    Ran Out of Food in the Last Year: Never true  Transportation Needs: No Transportation Needs (01/26/2019)   PRAPARE - Hydrologist (Medical): No    Lack of Transportation (Non-Medical): No  Physical Activity: Unknown (01/26/2019)   Exercise Vital Sign    Days of Exercise per Week: Patient refused    Minutes of Exercise per Session: Patient refused  Stress: No Stress Concern Present (01/26/2019)   [place]     Feeling of Stress : Not at all  Social Connections: Not on file     Family History: The patient's family history includes Breast  cancer in her maternal aunt; Cancer in her mother; Heart disease in her father; Parkinson's disease in her sister. There is no history of Colon cancer, Colon polyps, Rectal cancer, Stomach cancer, Thyroid disease, or Esophageal cancer.  ROS:   Please see the history of present illness.     All other systems reviewed and are negative.  EKGs/Labs/Other Studies Reviewed:    The following studies were reviewed today:   EKG: June 12, 2022: Normal sinus rhythm.  Left bundle branch block.  Heart rate is 100.     Recent Labs: 08/11/2021: Magnesium 2.0; TSH 2.807 06/12/2022: ALT 12; BUN 11; Creatinine, Ser 0.86; Hemoglobin 11.2; Platelets 368.0; Potassium 4.2; Pro B Natriuretic peptide (BNP) 38.0; Sodium 137  Recent Lipid Panel    Component Value Date/Time   CHOL 110 07/12/2022 1236   TRIG 163.0 (H) 07/12/2022 1236   HDL 42.60 07/12/2022 1236   CHOLHDL 3 07/12/2022 1236   VLDL 32.6 07/12/2022 1236   LDLCALC 34 07/12/2022 1236   LDLDIRECT 72.0 06/23/2021 1245     Risk Assessment/Calculations:           Physical Exam:    VS:  BP 130/80 (BP Location: Left Arm, Patient Position: Sitting, Cuff Size: Normal)   Pulse (!) 107   Resp 20   Ht '5\' 4"'$  (1.626 m)   Wt 232 lb (105.2 kg)   SpO2 97%   BMI 39.82 kg/m     Wt Readings from Last 3 Encounters:  07/16/22 232 lb (105.2 kg)  07/12/22 233 lb (105.7 kg)  06/21/22 233 lb (105.7 kg)     GEN:  Well nourished, well developed in no acute distress HEENT: Normal NECK: No JVD; No carotid bruits LYMPHATICS: No lymphadenopathy CARDIAC: RRR, no murmurs, rubs, gallops RESPIRATORY:  Clear to auscultation without rales, wheezing or rhonchi  ABDOMEN: Soft, non-tender, non-distended MUSCULOSKELETAL:  No edema; No deformity  SKIN: Warm and dry NEUROLOGIC:  Alert and oriented x 3 PSYCHIATRIC:  Normal affect    ASSESSMENT:    1. Tachycardia    PLAN:    In order of problems listed above:  Acute on chronic diastolic congestive heart failure : Kaylene presents for further evaluation of acute shortness of breath.  She has significant orthopnea here in the office today.  I suspect that she is volume overloaded. She was tried on Lasix but it never caused her to achieve any diuresis.  We will start her on torsemide 20 mg a day and potassium chloride 10 mill equivalents twice a day.  I have instructed her to double the torsemide tomorrow if the 20 mg does not work for her.  We will start her on Entresto 49-51 twice a day.  We will start her on Jardiance 10 mg a day.  We will discontinue the amlodipine.  She remains tachycardic but I am hopeful that her heart rate will slow once we were able to get rid of some volume.   2.  Possible coronary artery disease: I cannot exclude coronary artery disease.  She has a strong family history of coronary artery disease.  Most of her symptoms sound like congestive heart..  She has not really had any episodes of angina.  My plan is to get a coronary CT angiogram once we get her heart tuned up a bit better.  I will see her in 1 week for follow-up visit.  3.  Tachycardia: We will check a TSH today.  Her thyroid was normal last year.  Medication Adjustments/Labs and Tests Ordered: Current medicines are reviewed at length with the patient today.  Concerns regarding medicines are outlined above.  Orders Placed This Encounter  Procedures   TSH   Meds ordered this encounter  Medications   torsemide (DEMADEX) 20 MG tablet    Sig: Take 1 tablet (20 mg total) by mouth daily.    Dispense:  90 tablet    Refill:  0   potassium chloride (KLOR-CON M) 10 MEQ tablet    Sig: Take 1 tablet (10 mEq total) by mouth 2 (two) times daily.    Dispense:  180 tablet    Refill:  3   sacubitril-valsartan (ENTRESTO) 49-51 MG    Sig: Take 1 tablet by mouth 2 (two) times  daily.    Dispense:  180 tablet    Refill:  3   empagliflozin (JARDIANCE) 10 MG TABS tablet    Sig: Take 1 tablet (10 mg total) by mouth daily before breakfast.    Dispense:  90 tablet    Refill:  3    Patient Instructions  Check to see if Entresto and Jardiance  is on your approved med list         Medication Instructions:  STOP AMLODIPINE START ENTRESTO 49/51  1 TWICE DAILY  JARDIANCE 10 MG 1 EVERY DAY TORSEMIDE 20 MG EVERY DAY  K-DUR 10 MEQ 1 TWICE DAILY *If you need a refill on your cardiac medications before your next appointment, please call your pharmacy*   Lab Work: TSH TODAY  If you have labs (blood work) drawn today and your tests are completely normal, you will receive your results only by: MyChart Message (if you have MyChart) OR A paper copy in the mail If you have any lab test that is abnormal or we need to change your treatment, we will call you to review the results.   Testing/Procedures: NONE   Follow-Up: At [facility], you and your health needs are our priority.  As part of our continuing mission to provide you with exceptional heart care, we have created designated Provider Care Teams.  These Care Teams include your primary Cardiologist (physician) and Advanced Practice Providers (APPs -  Physician Assistants and Nurse Practitioners) who all work together to provide you with the care you need, when you need it.  We recommend signing up for the patient portal called "MyChart".  Sign up information is provided on this After Visit Summary.  MyChart is used to connect with patients for Virtual Visits (Telemedicine).  Patients are able to view lab/test results, encounter notes, upcoming appointments, etc.  Non-urgent messages can be sent to your provider as well.   To learn more about what you can do with MyChart, go to [email or web address].    Your next appointment:   1 week(s)  The format for your next appointment:   In Person  Provider:   DR  [facility]     Other Instructions NONE  Important Information About Sugar         Signed, Mertie Moores, MD  07/16/2022 5:20 PM    [place]

## 2022-07-16 ENCOUNTER — Ambulatory Visit: Payer: Medicare Other | Admitting: Cardiovascular Disease

## 2022-07-16 ENCOUNTER — Encounter: Payer: Self-pay | Admitting: Cardiovascular Disease

## 2022-07-16 VITALS — BP 130/80 | HR 107 | Resp 20 | Ht 64.0 in | Wt 232.0 lb

## 2022-07-16 DIAGNOSIS — R000 Tachycardia, unspecified: Secondary | ICD-10-CM

## 2022-07-16 MED ORDER — TORSEMIDE 20 MG PO TABS: 20.0000 mg | ORAL_TABLET | Freq: Every day | ORAL | 0 refills | Status: DC

## 2022-07-16 MED ORDER — EMPAGLIFLOZIN 10 MG PO TABS: 10.0000 mg | ORAL_TABLET | Freq: Every day | ORAL | 3 refills | Status: DC

## 2022-07-16 MED ORDER — POTASSIUM CHLORIDE CRYS ER 10 MEQ PO TBCR: 10.0000 meq | EXTENDED_RELEASE_TABLET | Freq: Two times a day (BID) | ORAL | 3 refills | Status: DC

## 2022-07-16 MED ORDER — SACUBITRIL-VALSARTAN 49-51 MG PO TABS: 1.0000 | ORAL_TABLET | Freq: Two times a day (BID) | ORAL | 3 refills | Status: DC

## 2022-07-16 NOTE — Patient Instructions (Addendum)
Check to see if Entresto and Vania Rea  is on your approved med list         Medication Instructions:  STOP AMLODIPINE START ENTRESTO 49/51  1 TWICE DAILY  JARDIANCE 10 MG 1 EVERY DAY TORSEMIDE 20 MG EVERY DAY  K-DUR 10 MEQ 1 TWICE DAILY *If you need a refill on your cardiac medications before your next appointment, please call your pharmacy*   Lab Work: TSH TODAY  If you have labs (blood work) drawn today and your tests are completely normal, you will receive your results only by: MyChart Message (if you have MyChart) OR A paper copy in the mail If you have any lab test that is abnormal or we need to change your treatment, we will call you to review the results.   Testing/Procedures: NONE   Follow-Up: At [facility], you and your health needs are our priority.  As part of our continuing mission to provide you with exceptional heart care, we have created designated Provider Care Teams.  These Care Teams include your primary Cardiologist (physician) and Advanced Practice Providers (APPs -  Physician Assistants and Nurse Practitioners) who all work together to provide you with the care you need, when you need it.  We recommend signing up for the patient portal called "MyChart".  Sign up information is provided on this After Visit Summary.  MyChart is used to connect with patients for Virtual Visits (Telemedicine).  Patients are able to view lab/test results, encounter notes, upcoming appointments, etc.  Non-urgent messages can be sent to your provider as well.   To learn more about what you can do with MyChart, go to [email or web address].    Your next appointment:   1 week(s)  The format for your next appointment:   In Person  Provider:   DR Acie Fredrickson     Other Instructions NONE  Important Information About Sugar

## 2022-07-17 LAB — TSH: TSH: 2.21 u[IU]/mL (ref 0.450–4.500)

## 2022-07-19 ENCOUNTER — Ambulatory Visit: Payer: Medicare Other | Admitting: Pulmonary Disease

## 2022-07-19 ENCOUNTER — Encounter: Payer: Self-pay | Admitting: Pulmonary Disease

## 2022-07-19 VITALS — BP 115/72 | HR 114 | Temp 98.1°F | Ht 64.5 in | Wt 225.8 lb

## 2022-07-19 DIAGNOSIS — I5032 Chronic diastolic (congestive) heart failure: Secondary | ICD-10-CM

## 2022-07-19 DIAGNOSIS — R0602 Shortness of breath: Secondary | ICD-10-CM | POA: Diagnosis not present

## 2022-07-19 DIAGNOSIS — G479 Sleep disorder, unspecified: Secondary | ICD-10-CM | POA: Diagnosis not present

## 2022-07-19 LAB — BASIC METABOLIC PANEL
BUN: 17 mg/dL (ref 6–23)
CO2: 27 mEq/L (ref 19–32)
Calcium: 9.8 mg/dL (ref 8.4–10.5)
Chloride: 99 mEq/L (ref 96–112)
Creatinine, Ser: 1.02 mg/dL (ref 0.40–1.20)
GFR: 55.54 mL/min — ABNORMAL LOW (ref >60.00)
Glucose, Bld: 108 mg/dL — ABNORMAL HIGH (ref 70–99)
Potassium: 4 mEq/L (ref 3.5–5.1)
Sodium: 137 mEq/L (ref 135–145)

## 2022-07-19 LAB — MAGNESIUM: Magnesium: 2.2 mg/dL (ref 1.5–2.5)

## 2022-07-19 NOTE — Assessment & Plan Note (Signed)
A combination of dyspnea on exertion and pedal edema seems to be related to acute on chronic diastolic heart failure.  She has diuresed 10 pounds with torsemide.  CT changes of groundglass attenuation would be consistent with pulmonary vascular congestion, I doubt this is true ILD.  Review of her chest x-ray did not show any pleural effusions.  Her pedal edema is resolved but she remains fatigued and this may be due to excessive diuresis or side effects of medications.  She has held her [name] today, remains tachycardic I will recheck BMET and magnesium today, she sees cardiology again next week. We will hold off on further pulmonary work-up at this time

## 2022-07-19 NOTE — Patient Instructions (Signed)
  X Blood work - Art gallery manager , Mg

## 2022-07-19 NOTE — Progress Notes (Signed)
Subjective:    Patient ID: Denise [name], female    DOB: 28-Mar-1951, 71 y.o.   MRN: [id number]  HPI  71 year old never smoker referred for shortness of breath and abnormal CT scan. She presented with dyspnea on exertion and leg edema since February, dyspnea was worse on walking and relieved by rest.  She was given a course of prednisone and antibiotics for 10 days.  Amlodipine was stopped. She was referred to cardiology , echo showed normal LV function.  She has a left bundle branch block for 4 years she was noted to be tachycardic, TSH was normal. CT angiogram chest was obtained which was negative for pulm embolism and showed mild groundglass attenuation of the parenchyma, hence referred to [place] to rule out ILD. She was started on torsemide, potassium, Entresto and Jardiance.  She has lost 10 pounds over the last 1 week but feels fatigued and remains short of breath.  Pedal edema has resolved.  PMH -TIA, prediabetes, hypertension  pSH -hysterectomy, breast lumpectomy, appendectomy, partial thyroidectomy, cholecystectomy, pituitary tumor resection 2005  Significant tests/ events reviewed  Echo 06/2022 Nml LVEF , asc aorta 37 mm CTA chest 06/2022 Mild ground-glass attenuation of the lung parenchyma  MBS 06/2022 delayed clearance of barium tablet  Past Medical History:  Diagnosis Date   Allergy    seasonal   Arthritis    Bursitis of hip    Left   Constipation    90%of the time c/o constipation, occ has diarrhea - uses stool softener    Endometriosis    Facial flushing 05/12/2014   Fibroid    GERD (gastroesophageal reflux disease)    Hematochezia    Hiatal hernia    HSV-1 (herpes simplex virus 1) infection    Hyperlipidemia    Hyperplastic colon polyp    Hypertension    Hypothyroidism    Ischemic colitis ([place]) 01/2019   LBBB (left bundle branch block)    Low back pain    Migraine    Osteopenia 2008   -1.2 FEMORAL NECK   Prediabetes    Stroke [facility])    TIA   TIA  (transient ischemic attack) 08-10-2013    Past Surgical History:  Procedure Laterality Date   ABDOMINAL HYSTERECTOMY  1989   TAH, BSO   CHOLECYSTECTOMY  2003   COLONOSCOPY     KNEE SURGERY  2004   right   NASAL SEPTUM SURGERY  2012   PITUITARY SURGERY  2001   POLYPECTOMY     SHOULDER SURGERY  2007   right   THYROID LOBECTOMY  2002    Allergies  Allergen Reactions   Tramadol Other (See Comments)    Reaction: NIGHTMARES    Social History   Socioeconomic History   Marital status: Divorced    Spouse name: Not on file   Number of children: 2   Years of education: Not on file   Highest education level: Not on file  Occupational History    Employer: RETIRED  Tobacco Use   Smoking status: Never   Smokeless tobacco: Never  Vaping Use   Vaping Use: Never used  Substance and Sexual Activity   Alcohol use: Yes    Alcohol/week: 0.0 standard drinks of alcohol    Comment: 1 every 3 months   Drug use: No   Sexual activity: Not Currently    Comment: intercourse age 68, more than 5 sexual partners,des neg  Other Topics Concern   Not on file  Social History Narrative  Single.   2 daughters, 4 grandchildren.   Once worked for [employer].   Enjoys Ambulance person.    Social Determinants of Health   Financial Resource Strain: Low Risk  (12/28/2021)   Overall Financial Resource Strain (CARDIA)    Difficulty of Paying Living Expenses: Not hard at all  Food Insecurity: No Food Insecurity (01/26/2019)   Hunger Vital Sign    Worried About Running Out of Food in the Last Year: Never true    Ran Out of Food in the Last Year: Never true  Transportation Needs: No Transportation Needs (01/26/2019)   PRAPARE - Hydrologist (Medical): No    Lack of Transportation (Non-Medical): No  Physical Activity: Unknown (01/26/2019)   Exercise Vital Sign    Days of Exercise per Week: Patient refused    Minutes of Exercise per Session:  Patient refused  Stress: No Stress Concern Present (01/26/2019)   [place]    Feeling of Stress : Not at all  Social Connections: Not on file  Intimate Partner Violence: Not on file    Family History  Problem Relation Age of Onset   Heart disease Father    Cancer Mother        brain tumor   Breast cancer Maternal Aunt    Parkinson's disease Sister    Colon cancer Neg Hx    Colon polyps Neg Hx    Rectal cancer Neg Hx    Stomach cancer Neg Hx    Thyroid disease Neg Hx    Esophageal cancer Neg Hx      Review of Systems Constitutional: negative for anorexia, fevers and sweats  Eyes: negative for irritation, redness and visual disturbance  Ears, nose, mouth, throat, and face: negative for earaches, epistaxis, nasal congestion and sore throat  Respiratory: negative for cough,  sputum and wheezing  Cardiovascular: negative for chest pain, orthopnea, palpitations and syncope  Gastrointestinal: negative for abdominal pain, constipation, diarrhea, melena, nausea and vomiting  Genitourinary:negative for dysuria, frequency and hematuria  Hematologic/lymphatic: negative for bleeding, easy bruising and lymphadenopathy  Musculoskeletal:negative for arthralgias, muscle weakness and stiff joints  Neurological: negative for coordination problems, gait problems, headaches and weakness  Endocrine: negative for diabetic symptoms including polydipsia, polyuria and weight loss     Objective:   Physical Exam  Gen. Pleasant, obese, in no distress, normal affect ENT - no pallor,icterus, no post nasal drip, class 2-3 airway Neck: No JVD, no thyromegaly, no carotid bruits Lungs: no use of accessory muscles, no dullness to percussion, decreased without rales or rhonchi  Cardiovascular: Rhythm regular, heart sounds  normal, no murmurs or gallops, no peripheral edema Abdomen: soft and non-tender, no hepatosplenomegaly, BS  normal. Musculoskeletal: No deformities, no cyanosis or clubbing Neuro:  alert, non focal, no tremors       Assessment & Plan:

## 2022-07-19 NOTE — Assessment & Plan Note (Signed)
OSA is possible and a home sleep test study can be pursued after her current acute issues are resolved

## 2022-07-20 NOTE — Progress Notes (Signed)
Spoke with pt and notified of results per Dr. Alva. Pt verbalized understanding and denied any questions. 

## 2022-07-22 ENCOUNTER — Encounter: Payer: Self-pay | Admitting: Cardiovascular Disease

## 2022-07-22 NOTE — Progress Notes (Signed)
Cardiology Office Note:    Date:  07/23/2022   ID:  Denise [name], DOB 11/28/1951, MRN [id number]  PCP:  Pleas Koch, NP   [place] Providers Cardiologist:  None     Referring MD: Pleas Koch, NP   Chief Complaint  Patient presents with   Congestive Heart Failure            Problem list   LBBB Dyspnea TIA   History of Present Illness:    Denise [name] is a 71 y.o. female with a hx of obesity, dyspnea, TIA, LBBB,   We were asked to see her by [name] for further evaluation of her shortness of breath   Echocardiogram from July 09, 2022 reveals normal LVEF of 55 to 60%. She has no significant valvular disease.  There is trivial  tricuspid regurgitation.  Has had LBBB 3-4 years ago Runs out of breath while walking and while dressing  Unable to take a deep breath  ( Has pulmonary appt in Aug)   Leg edema a year ago.  Edema is gradually worsening  She took lasix for 5 days ( does not remember the dose )  Does not think it helped.   Has 2 pillow orthopnea  Worsening over the past several months   25 lb weight gain over the past year Has not been able to walk much   Does not add much salt.  Is recently trying to avoid processed food   + fam hx of CAD Father had MI in mid 19's  Grandmother had stroke    Review of the labs from her primary medical doctor from June 27 shows a white blood cell count of 13.7.  Chest x-ray revealed diffuse infiltrates. CT scan showed ground glass appearance   Aug. 7, 2023 [name] is seen for a 1 week follow up of her CHF We changed her lasix to torsemide Started Entresto 49-51 BID, Jardiance 10 mg a day   Echo 07/09/22:    Normal LV systolic function - EF 36-46% No significant valvular disease Coronary CTA has been ordered but not scheduled yet  Feels much better  Is urinating more . Is feeling washed out ( likely from the torsemide therapy)   Wt is 226 lbs ( down 6 lbs from last  week )  Diet is better. Less salt  Potassium level was 4.0 on August 3.  This was 3 to 4 days after starting her torsemide and potassium therapy. Echocardiogram from July 24 shows normal left ventricular systolic function.  We were not able to assess her diastolic function. No significant mitral valve disease or aortic valve disease. Very minimal dilatation of the ascending aorta. RV function was normal  Does not have orthopnea any longer, still using 2 pillows    Past Medical History:  Diagnosis Date   Allergy    seasonal   Arthritis    Bursitis of hip    Left   Constipation    90%of the time c/o constipation, occ has diarrhea - uses stool softener    Endometriosis    Facial flushing 05/12/2014   Fibroid    GERD (gastroesophageal reflux disease)    Hematochezia    Hiatal hernia    HSV-1 (herpes simplex virus 1) infection    Hyperlipidemia    Hyperplastic colon polyp    Hypertension    Hypothyroidism    Ischemic colitis ([place]) 01/2019   LBBB (left bundle branch block)    Low  back pain    Migraine    Osteopenia 2008   -1.2 FEMORAL NECK   Prediabetes    Stroke [facility])    TIA   TIA (transient ischemic attack) 08-10-2013    Past Surgical History:  Procedure Laterality Date   ABDOMINAL HYSTERECTOMY  1989   TAH, BSO   CHOLECYSTECTOMY  2003   COLONOSCOPY     KNEE SURGERY  2004   right   NASAL SEPTUM SURGERY  2012   PITUITARY SURGERY  2001   POLYPECTOMY     SHOULDER SURGERY  2007   right   THYROID LOBECTOMY  2002    Current Medications: Current Meds  Medication Sig   aspirin 81 MG tablet Take 81 mg by mouth daily.   atorvastatin (LIPITOR) 10 MG tablet TAKE 1 TABLET BY MOUTH  DAILY FOR CHOLESTEROL   buPROPion ER (WELLBUTRIN SR) 100 MG 12 hr tablet TAKE 1 TABLET (100 MG TOTAL) BY MOUTH 2 (TWO) TIMES DAILY. FOR DEPRESSION   Cholecalciferol (D3) 10 MCG (400 UNIT) CHEW Chew by mouth.   DULoxetine (CYMBALTA) 60 MG capsule TAKE 1 CAPSULE BY MOUTH  DAILY FOR HEADACHE   PREVENTION   empagliflozin (JARDIANCE) 10 MG TABS tablet Take 1 tablet (10 mg total) by mouth daily before breakfast.   esomeprazole (NEXIUM) 20 MG capsule Take 1 capsule (20 mg total) by mouth every morning.   levothyroxine (SYNTHROID) 75 MCG tablet TAKE 1 TABLET BY MOUTH IN  THE MORNING ON AN EMPTY  STOMACH WITH WATER ONLY. NO FOOD OR OTHER MEDICATIONS  FOR 1/2 HOUR   magnesium 30 MG tablet Take 30 mg by mouth daily.   metoprolol succinate (TOPROL-XL) 25 MG 24 hr tablet TAKE 1 TABLET BY MOUTH  DAILY FOR BLOOD PRESSURE   potassium chloride (KLOR-CON) 10 MEQ tablet Take 1 tablet by mouth twice daily on Mondays, Wednesdays, and Fridays   rizatriptan (MAXALT) 5 MG tablet Take 1 tablet by mouth at migraine onset. May repeat with 1 tablet in 2 hours if needed.   sacubitril-valsartan (ENTRESTO) 49-51 MG Take 1 tablet by mouth 2 (two) times daily.   spironolactone (ALDACTONE) 25 MG tablet Take 1 tablet (25 mg total) by mouth daily.   torsemide (DEMADEX) 20 MG tablet Take 1 tablet by mouth on Mondays/Wednesdays/Fridays   [DISCONTINUED] mirtazapine (REMERON) 7.5 MG tablet Take 1 tablet (7.5 mg total) by mouth at bedtime. For sleep   [DISCONTINUED] potassium chloride (KLOR-CON M) 10 MEQ tablet Take 1 tablet (10 mEq total) by mouth 2 (two) times daily.   [DISCONTINUED] torsemide (DEMADEX) 20 MG tablet Take 1 tablet (20 mg total) by mouth daily.     Allergies:   Tramadol   Social History   Socioeconomic History   Marital status: Divorced    Spouse name: Not on file   Number of children: 2   Years of education: Not on file   Highest education level: Not on file  Occupational History    Employer: RETIRED  Tobacco Use   Smoking status: Never   Smokeless tobacco: Never  Vaping Use   Vaping Use: Never used  Substance and Sexual Activity   Alcohol use: Yes    Alcohol/week: 0.0 standard drinks of alcohol    Comment: 1 every 3 months   Drug use: No   Sexual activity: Not Currently    Comment:  intercourse age 11, more than 5 sexual partners,des neg  Other Topics Concern   Not on file  Social History Narrative   Single.  2 daughters, 4 grandchildren.   Once worked for [employer].   Enjoys Ambulance person.    Social Determinants of Health   Financial Resource Strain: Low Risk  (12/28/2021)   Overall Financial Resource Strain (CARDIA)    Difficulty of Paying Living Expenses: Not hard at all  Food Insecurity: No Food Insecurity (01/26/2019)   Hunger Vital Sign    Worried About Running Out of Food in the Last Year: Never true    Ran Out of Food in the Last Year: Never true  Transportation Needs: No Transportation Needs (01/26/2019)   PRAPARE - Hydrologist (Medical): No    Lack of Transportation (Non-Medical): No  Physical Activity: Unknown (01/26/2019)   Exercise Vital Sign    Days of Exercise per Week: Patient refused    Minutes of Exercise per Session: Patient refused  Stress: No Stress Concern Present (01/26/2019)   [place]    Feeling of Stress : Not at all  Social Connections: Not on file     Family History: The patient's family history includes Breast cancer in her maternal aunt; Cancer in her mother; Heart disease in her father; Parkinson's disease in her sister. There is no history of Colon cancer, Colon polyps, Rectal cancer, Stomach cancer, Thyroid disease, or Esophageal cancer.  ROS:   Please see the history of present illness.     All other systems reviewed and are negative.  EKGs/Labs/Other Studies Reviewed:    The following studies were reviewed today:   EKG:       Recent Labs: 06/12/2022: ALT 12; Hemoglobin 11.2; Platelets 368.0; Pro B Natriuretic peptide (BNP) 38.0 07/16/2022: TSH 2.210 07/19/2022: BUN 17; Creatinine, Ser 1.02; Magnesium 2.2; Potassium 4.0; Sodium 137  Recent Lipid Panel    Component Value Date/Time   CHOL  110 07/12/2022 1236   TRIG 163.0 (H) 07/12/2022 1236   HDL 42.60 07/12/2022 1236   CHOLHDL 3 07/12/2022 1236   VLDL 32.6 07/12/2022 1236   LDLCALC 34 07/12/2022 1236   LDLDIRECT 72.0 06/23/2021 1245     Risk Assessment/Calculations:           Physical Exam: Blood pressure 112/72, pulse 95, height 5' 4.5" (1.638 m), weight 226 lb 3.2 oz (102.6 kg), SpO2 97 %.  GEN:  Well nourished, well developed in no acute distress HEENT: Normal NECK: No JVD; No carotid bruits LYMPHATICS: No lymphadenopathy CARDIAC: RRR , no murmurs, rubs, gallops RESPIRATORY:  Clear to auscultation without rales, wheezing or rhonchi  ABDOMEN: Soft, non-tender, non-distended MUSCULOSKELETAL:  No edema; No deformity  SKIN: Warm and dry NEUROLOGIC:  Alert and oriented x 3   ASSESSMENT:    1. Acute on chronic diastolic heart failure ([place])   2. Chronic diastolic heart failure ([place])   3. LBBB (left bundle branch block)     PLAN:      Acute on chronic diastolic congestive heart failure :  Improved since last week No further PND or orthopnea.  Able to lie on the exam table without dyspnea Add spironolatone 25 mg a day  Reduce torsemide to 20 meq on M,W,F Reduce Kdur 10 meq BID on M,W,F  Continue entresto 49-51 BID,  toprol XL 25 mg a day     2.  Possible coronary artery disease:  Will get a coronary CTA once she is better and HR is slower   .  3.  Tachycardia:   Improved with treatment  of her CHF            Medication Adjustments/Labs and Tests Ordered: Current medicines are reviewed at length with the patient today.  Concerns regarding medicines are outlined above.  Orders Placed This Encounter  Procedures   Basic metabolic panel   Meds ordered this encounter  Medications   spironolactone (ALDACTONE) 25 MG tablet    Sig: Take 1 tablet (25 mg total) by mouth daily.    Dispense:  90 tablet    Refill:  3   torsemide (DEMADEX) 20 MG tablet    Sig: Take 1 tablet by mouth on  Mondays/Wednesdays/Fridays    Dispense:  36 tablet    Refill:  3   potassium chloride (KLOR-CON) 10 MEQ tablet    Sig: Take 1 tablet by mouth twice daily on Mondays, Wednesdays, and Fridays    Dispense:  72 tablet    Refill:  3    Patient Instructions  Medication Instructions:  DECREASE Torsemide to '20mg'$  Monday, Wednesday, and Fridays only DECREASE Potassium to 56mq twice daily on M/W/F only START Spironolactone '25mg'$  daily *If you need a refill on your cardiac medications before your next appointment, please call your pharmacy*   Lab Work: BMET on 07/31/22 If you have labs (blood work) drawn today and your tests are completely normal, you will receive your results only by: [place](if you have MyChart) OR A paper copy in the mail If you have any lab test that is abnormal or we need to change your treatment, we will call you to review the results.   Testing/Procedures: NONE  Follow-Up: [facility] you and your health needs are our priority.  As part of our continuing mission to provide you with exceptional heart care, we have created designated Provider Care Teams.  These Care Teams include your primary Cardiologist (physician) and Advanced Practice Providers (APPs -  Physician Assistants and Nurse Practitioners) who all work together to provide you with the care you need, when you need it.  Your next appointment:   07/31/22  The format for your next appointment:   In Person  Provider:   Marquelle Musgrave {    Important Information About Sugar         Signed, PMertie Moores MD  07/23/2022 9:40 PM    [place]

## 2022-07-23 ENCOUNTER — Ambulatory Visit: Payer: Medicare Other | Admitting: Cardiovascular Disease

## 2022-07-23 ENCOUNTER — Other Ambulatory Visit (HOSPITAL_COMMUNITY): Payer: Self-pay | Admitting: *Deleted

## 2022-07-23 ENCOUNTER — Encounter: Payer: Self-pay | Admitting: Cardiovascular Disease

## 2022-07-23 ENCOUNTER — Other Ambulatory Visit: Payer: Self-pay | Admitting: Primary Care

## 2022-07-23 VITALS — BP 112/72 | HR 95 | Ht 64.5 in | Wt 226.2 lb

## 2022-07-23 DIAGNOSIS — I447 Left bundle-branch block, unspecified: Secondary | ICD-10-CM

## 2022-07-23 DIAGNOSIS — R072 Precordial pain: Secondary | ICD-10-CM

## 2022-07-23 DIAGNOSIS — I5033 Acute on chronic diastolic (congestive) heart failure: Secondary | ICD-10-CM

## 2022-07-23 DIAGNOSIS — I5032 Chronic diastolic (congestive) heart failure: Secondary | ICD-10-CM

## 2022-07-23 DIAGNOSIS — G479 Sleep disorder, unspecified: Secondary | ICD-10-CM

## 2022-07-23 MED ORDER — TORSEMIDE 20 MG PO TABS: ORAL_TABLET | ORAL | 3 refills | Status: DC

## 2022-07-23 MED ORDER — POTASSIUM CHLORIDE ER 10 MEQ PO TBCR: EXTENDED_RELEASE_TABLET | ORAL | 3 refills | Status: DC

## 2022-07-23 MED ORDER — SPIRONOLACTONE 25 MG PO TABS: 25.0000 mg | ORAL_TABLET | Freq: Every day | ORAL | 3 refills | Status: DC

## 2022-07-23 NOTE — Patient Instructions (Signed)
Medication Instructions:  DECREASE Torsemide to '20mg'$  Monday, Wednesday, and Fridays only DECREASE Potassium to 9mq twice daily on M/W/F only START Spironolactone '25mg'$  daily *If you need a refill on your cardiac medications before your next appointment, please call your pharmacy*   Lab Work: BMET on 07/31/22 If you have labs (blood work) drawn today and your tests are completely normal, you will receive your results only by: [place](if you have MyChart) OR A paper copy in the mail If you have any lab test that is abnormal or we need to change your treatment, we will call you to review the results.   Testing/Procedures: NONE  Follow-Up: At [facility] you and your health needs are our priority.  As part of our continuing mission to provide you with exceptional heart care, we have created designated Provider Care Teams.  These Care Teams include your primary Cardiologist (physician) and Advanced Practice Providers (APPs -  Physician Assistants and Nurse Practitioners) who all work together to provide you with the care you need, when you need it.  Your next appointment:   07/31/22  The format for your next appointment:   In Person  Provider:   [name] {    Important Information About Sugar

## 2022-07-26 ENCOUNTER — Other Ambulatory Visit: Payer: Self-pay | Admitting: Primary Care

## 2022-07-26 DIAGNOSIS — I10 Essential (primary) hypertension: Secondary | ICD-10-CM

## 2022-07-27 ENCOUNTER — Telehealth: Payer: Self-pay | Admitting: *Deleted

## 2022-07-27 ENCOUNTER — Ambulatory Visit: Payer: Medicare Other | Admitting: Nurse Practitioner

## 2022-07-27 ENCOUNTER — Other Ambulatory Visit (INDEPENDENT_AMBULATORY_CARE_PROVIDER_SITE_OTHER): Payer: Medicare Other

## 2022-07-27 ENCOUNTER — Encounter: Payer: Self-pay | Admitting: Nurse Practitioner

## 2022-07-27 VITALS — BP 114/72 | HR 100 | Ht 64.5 in | Wt 228.2 lb

## 2022-07-27 DIAGNOSIS — I509 Heart failure, unspecified: Secondary | ICD-10-CM

## 2022-07-27 DIAGNOSIS — R1310 Dysphagia, unspecified: Secondary | ICD-10-CM

## 2022-07-27 DIAGNOSIS — K5900 Constipation, unspecified: Secondary | ICD-10-CM

## 2022-07-27 DIAGNOSIS — D649 Anemia, unspecified: Secondary | ICD-10-CM | POA: Diagnosis not present

## 2022-07-27 DIAGNOSIS — Z860100 Personal history of colon polyps, unspecified: Secondary | ICD-10-CM

## 2022-07-27 DIAGNOSIS — R748 Abnormal levels of other serum enzymes: Secondary | ICD-10-CM | POA: Diagnosis not present

## 2022-07-27 LAB — HEPATIC FUNCTION PANEL
ALT: 10 U/L (ref 0–35)
AST: 14 U/L (ref 0–37)
Albumin: 3.9 g/dL (ref 3.5–5.2)
Alkaline Phosphatase: 195 U/L — ABNORMAL HIGH (ref 39–117)
Bilirubin, Direct: 0.1 mg/dL (ref 0.0–0.3)
Total Bilirubin: 0.3 mg/dL (ref 0.2–1.2)
Total Protein: 6.9 g/dL (ref 6.0–8.3)

## 2022-07-27 LAB — CBC
HCT: 36.2 % (ref 36.0–46.0)
Hemoglobin: 11.7 g/dL — ABNORMAL LOW (ref 12.0–15.0)
MCHC: 32.2 g/dL (ref 30.0–36.0)
MCV: 76.7 fl — ABNORMAL LOW (ref 78.0–100.0)
Platelets: 359 10*3/uL (ref 150.0–400.0)
RBC: 4.72 Mil/uL (ref 3.87–5.11)
RDW: 15.3 % (ref 11.5–15.5)
WBC: 7.8 10*3/uL (ref 4.0–10.5)

## 2022-07-27 LAB — IBC + FERRITIN
Ferritin: 6.6 ng/mL — ABNORMAL LOW (ref 10.0–291.0)
Iron: 28 ug/dL — ABNORMAL LOW (ref 42–145)
Saturation Ratios: 5.8 % — ABNORMAL LOW (ref 20.0–50.0)
TIBC: 483 ug/dL — ABNORMAL HIGH (ref 250.0–450.0)
Transferrin: 345 mg/dL (ref 212.0–360.0)

## 2022-07-27 LAB — GAMMA GT: GGT: 16 U/L (ref 7–51)

## 2022-07-27 LAB — VITAMIN B12: Vitamin B-12: 296 pg/mL (ref 211–911)

## 2022-07-27 NOTE — Telephone Encounter (Signed)
[facility] Pre-operative Risk Assessment     Request for surgical clearance:     Endoscopy Procedure  What type of surgery is being performed?     EGD/Colon  When is this surgery scheduled?     TBD  What type of clearance is required ?   Cardiac  Practice name and name of physician performing surgery?      [facility]  What is your office phone and fax number?      Phone- [phone]  [phone]  Anesthesia type (None, local, MAC, general) ?       MAC

## 2022-07-27 NOTE — Patient Instructions (Signed)
Start Miralax 1 capful nightly in 8 ounces of liquid.  Continue Lansoprazole 15 mg daily, may increase to twice daily if needed.  NO Ibuprofen.   Your provider has requested that you go to the basement level for lab work before leaving today. Press "B" on the elevator. The lab is located at the first door on the left as you exit the elevator.  _______________________________________________________  If you are age 71 or older, your body mass index should be between 23-30. Your Body mass index is 38.57 kg/m. If this is out of the aforementioned range listed, please consider follow up with your Primary Care Provider.  If you are age 59 or younger, your body mass index should be between 19-25. Your Body mass index is 38.57 kg/m. If this is out of the aformentioned range listed, please consider follow up with your Primary Care Provider.   ________________________________________________________  The [place] [facility] providers would like to encourage you to use [facility] to communicate with providers for non-urgent requests or questions.  Due to long hold times on the telephone, sending your provider a message by [facility] may be a faster and more efficient way to get a response.  Please allow 48 business hours for a response.  Please remember that this is for non-urgent requests.  _______________________________________________________

## 2022-07-27 NOTE — Progress Notes (Addendum)
Chief Complaint: Schedule a colonoscopy, difficulty swallowing  History of Present Illness: Denise [name] is a 71 year old female with a past medical history of obesity, hypertension, diastolic CHF with LVEF 55 - 60%, LBBB, TIA, hypothyroidism, pituitary adenoma, GERD, ischemic colitis 01/2019, LLQ 01/2020 (suspected diverticulitis) and colon polyps. Past cholecystectomy. She is followed by Dr. Havery Moros.   She presents today with complaints of voice change (raspy voice), frequent throat clearing with difficulty swallowing. Food gets stuck in her throat or upper esophagus which eventually passes down the esophagus in 10 to 15 minutes which will occur more often if she eats too quickly. She has intermittent food regurgitation since Nov or Dec 2022. No nausea or vomiting. No heartburn. She takes Lansoprazole 65m QD. History of a hiatal hernia. She also has a lump sensation in her throat and saw an ENT, negative evaluation. She underwent a modified barium swallow with tablet study with speech pathology 07/02/2022 which was unrevealing. EGD in 2013 which showed a 2 cm hiatal hernia, a benign flat polyp 3-55mwas found in the middle third of the esophagus and gastritis. No evidence of H. Pylori. She is passing a normal brown stool every few days. She has intermittent constipation and takes a stool softener a two days weekly. No rectal bleeding or black stools. Her most recent colonoscopy was 06/15/2016, 3 tubular adenomatous, sessile serrated and hyperplastic polyps were removed from the colon. She was advised to repeat a colonoscopy in 5 years. She had LLQ pain, suspected diverticulitis treated with a course of Augmentin 01/2020. A colonoscopy was scheduled 03/2020 but was not done. No known family history of esophageal, gastric or colon cancer.   She's been experiencing worsening SOB for the past few months with associated 25lb weight gain. She was seen by her cardiologist Dr. NaCathie Oldenn 07/16/2022. At that  time, she was assessed to have acute on chronic diastolic CHF. She was started on Torsemide 2092maily, KCL 94m12mD, Entresto and Jardiance. Amlodipine was discontinued. She was tachycardic and a TSH level was normal at 2.210. Chest x-ray revealed diffuse infiltrates. Chest CTA scan 06/22/2022 was negative for PE, showed ground glass appearance to the parenchyma. ECHO showed LV EF 55 - 60% on 07/09/2022. A coronary CT was planned after her CHF and tachycardia improves.   She was seen by pulmonologist Dr. RakeKara Mead8/02/2022 to rule out interstitial lung disease. She lost 10 lbs after starting Torsemide but remained SOB.  Dr. AlvaElsworth Soho not think she had ILD and no further pulmonary work up was recommended.   She followed up with Dr. NahsAcie Fredrickson8/06/2022, weight was down 6lbs from prior visit. Her SOB improved and Torsemide was reduced to 20me31m M,W,F with KCL and Spironolactone 25mg 76mas added. Coronary CT was ordered.   Labs 06/12/2022 identified new onset anemia. Hg 11.2 down from 13.4. MCV 78.3. Normal platelet count. Elevated alk phos 174 with normal T. Bili, AST/ALT levels.   She takes Ibuprofen 200mg o11mab 2 to 3 days weekly for LBP x 2 to 3 years, stopped taking it one week ago. On ASA 81mg da52m      Latest Ref Rng & Units 06/12/2022    1:11 PM 08/11/2021    9:22 AM 05/30/2020   12:40 PM  CBC  WBC 4.0 - 10.5 K/uL 13.7  7.3  7.7   Hemoglobin 12.0 - 15.0 g/dL 11.2  13.4  12.7   Hematocrit 36.0 - 46.0 % 34.7  42.2  38.3   Platelets 150.0 - 400.0 K/uL 368.0  294  301.0    MCV 78.3     Latest Ref Rng & Units 07/19/2022    2:49 PM 06/12/2022    1:11 PM 08/12/2021    2:16 AM  CMP  Glucose 70 - 99 mg/dL 108  114  124   BUN 6 - 23 mg/dL _0 Creatinine 0.40 - 1.20 mg/dL 1.02  0.86  0.74   Sodium 135 - 145 mEq/L 137  137  138   Potassium 3.5 - 5.1 mEq/L 4.0  4.2  4.2   Chloride 96 - 112 mEq/L 99  103  107   CO2 19 - 32 mEq/L _1 Calcium 8.4 - 10.5 mg/dL 9.8  8.7  9.3    Total Protein 6.0 - 8.3 g/dL  6.6  6.4   Total Bilirubin 0.2 - 1.2 mg/dL  0.4  0.7   Alkaline Phos 39 - 117 U/L  174  162   AST 0 - 37 U/L  14  16   ALT 0 - 35 U/L  12  14     IMAGE STUDIES:  Chest Xray 06/12/2022: 1. Borderline cardiomegaly again noted. No pulmonary venous congestion.   2. Bilateral pulmonary interstitial prominence is again noted. Similar findings noted on prior study of 08/11/2021. Although these changes may be chronic an active interstitial process including pneumonitis cannot be excluded  Chest CTA 06/22/2022: 1.  No evidence of pulmonary embolism.  2.  Mild cardiomegaly.  3. Mild ground-glass attenuation of the lung parenchyma, nonspecific finding could be secondary to air trapping or pulmonary vascular congestion. There is no focal consolidation or frank pulmonary edema.  Aortic Atherosclerosis   Barium swallow study 07/02/2022: Real-time fluoroscopy of the swallowing function was performed with a speech pathologist present.   Multiple consistencies of barium were administered which included water, nectar, pudding, and Graham cracker. A 13 mm barium sulfate tablet was also given.   Normal swallow function. No laryngeal penetration or tracheal aspiration. A 13 mm barium sulfate tablet was given which transited through the esophagus and esophageal gastric junction without delay.  ECHO 07/09/2022: IMPRESSIONS Left ventricular ejection fraction, by estimation, is 55 to 60%. The left ventricle has normal function. The left ventricle has no regional wall motion abnormalities. Left ventricular diastolic parameters are indeterminate. 1. 2. Right ventricular systolic function is normal. The right ventricular size is normal. The mitral valve is normal in structure. No evidence of mitral valve regurgitation. No evidence of mitral stenosis. 3. The aortic valve is normal in structure. Aortic valve regurgitation is not visualized. No aortic stenosis is  present. 4. 5. There is mild dilatation of the ascending aorta, measuring 37 mm. The inferior vena cava is normal in size with greater than 50% respiratory variability, suggesting right atrial pressure of 3 mmHg.   PAST GI PROCEDURES:  Colonoscopy 06/15/2016: - One 3 mm polyp in the cecum, removed with a cold biopsy forceps. Resected and retrieved. - Two 4 to 5 mm polyps in the ascending colon, removed with a cold snare. Resected and retrieved. - One 4 mm polyp in the rectum, removed with a cold snare. Resected and retrieved. - Diverticulosis in the sigmoid colon. - Non-bleeding internal hemorrhoids. - The examination was otherwise normal. TUBULAR ADENOMA, SESSILE SERRATED POLYP/ADENOMA AND HYPERPLASTIC COLONIC POLYPS.  EGD 09/10/2012: Flat poly 3 - 5 mm found in the middle third of the esophagus 2cm  hiatal hernia Gastritis in the gastric antrum 1. Surgical [P], gastric, antrum, bx - BENIGN ANTRAL MUCOSA. - NEGATIVE FOR H. PYLORI, DYSPLASIA OR MALIGNANCY. 2. Surgical [P], GE junction, bx - CHRONICALLY INFLAMED GE JUNCTIONAL MUCOSA. - NEGATIVE FOR H. PYLORI, SEROUS MUCOSA, DYSPLASIA OR MALIGNANCY. 3. Surgical [P], polyp, 20 cm, bx - BENIGN POLYPOID CHRONICALLY INFLAMED SQUAMOUS MUCOSA WITH UNDERLYING LYMPHOID AGGREGATE. - NEGATIVE FOR DYSPLASIA OR MALIGNANCY.  Current Outpatient Medications on File Prior to Visit  Medication Sig Dispense Refill   aspirin 81 MG tablet Take 81 mg by mouth daily.     atorvastatin (LIPITOR) 10 MG tablet TAKE 1 TABLET BY MOUTH  DAILY FOR CHOLESTEROL 90 tablet 3   buPROPion ER (WELLBUTRIN SR) 100 MG 12 hr tablet TAKE 1 TABLET (100 MG TOTAL) BY MOUTH 2 (TWO) TIMES DAILY. FOR DEPRESSION 180 tablet 0   Cholecalciferol (D3) 10 MCG (400 UNIT) CHEW Chew by mouth.     Docusate Calcium (STOOL SOFTENER PO) Take 1 tablet by mouth as needed.     DULoxetine (CYMBALTA) 60 MG capsule TAKE 1 CAPSULE BY MOUTH  DAILY FOR HEADACHE  PREVENTION 90 capsule 3    empagliflozin (JARDIANCE) 10 MG TABS tablet Take 1 tablet (10 mg total) by mouth daily before breakfast. 90 tablet 3   lansoprazole (EQ LANSOPRAZOLE) 15 MG capsule Take 15 mg by mouth 2 (two) times daily.     levothyroxine (SYNTHROID) 75 MCG tablet TAKE 1 TABLET BY MOUTH IN  THE MORNING ON AN EMPTY  STOMACH WITH WATER ONLY. NO FOOD OR OTHER MEDICATIONS  FOR 1/2 HOUR 90 tablet 3   magnesium 30 MG tablet Take 30 mg by mouth daily.     metoprolol succinate (TOPROL-XL) 25 MG 24 hr tablet TAKE 1 TABLET BY MOUTH DAILY FOR BLOOD PRESSURE 90 tablet 1   mirtazapine (REMERON) 7.5 MG tablet TAKE 1 TABLET (7.5 MG TOTAL) BY MOUTH AT BEDTIME. FOR SLEEP 90 tablet 0   potassium chloride (KLOR-CON) 10 MEQ tablet Take 1 tablet by mouth twice daily on Mondays, Wednesdays, and Fridays 72 tablet 3   rizatriptan (MAXALT) 5 MG tablet Take 1 tablet by mouth at migraine onset. May repeat with 1 tablet in 2 hours if needed. 10 tablet 0   sacubitril-valsartan (ENTRESTO) 49-51 MG Take 1 tablet by mouth 2 (two) times daily. 180 tablet 3   spironolactone (ALDACTONE) 25 MG tablet Take 1 tablet (25 mg total) by mouth daily. 90 tablet 3   torsemide (DEMADEX) 20 MG tablet Take 1 tablet by mouth on Mondays/Wednesdays/Fridays 36 tablet 3   No current facility-administered medications on file prior to visit.   Allergies  Allergen Reactions   Tramadol Other (See Comments)    Reaction: NIGHTMARES    Current Medications, Allergies, Past Medical History, Past Surgical History, Family History and Social History were reviewed in [employer] record.  Review of Systems:   Constitutional: + Weight loss on diuretics. Respiratory: See HPI. Cardiovascular: See HPI. Gastrointestinal: See HPI.  Musculoskeletal: Negative for back pain or muscle aches.  Neurological: Negative for dizziness, headaches or paresthesias.   Physical Exam: BP 114/72   Pulse 100   Ht 5' 4.5" (1.638 m)   Wt 228 lb 4 oz (103.5 kg)   BMI  38.57 kg/m   Wt Readings from Last 3 Encounters:  07/27/22 228 lb 4 oz (103.5 kg)  07/23/22 226 lb 3.2 oz (102.6 kg)  07/19/22 225 lb 12.8 oz (102.4 kg)    General: 71 year old  female in NAD.  Head: Normocephalic and atraumatic. Eyes: No scleral icterus. Conjunctiva pink . Ears: Normal auditory acuity. Mouth: Dentition intact. No ulcers or lesions.  Lungs: Clear throughout to auscultation. Heart: Regular rate and rhythm, no murmur. Abdomen: Soft, nontender and nondistended. No masses or hepatomegaly. Normal bowel sounds x 4 quadrants.  Rectal: Deferred. Musculoskeletal: Symmetrical with no gross deformities. Extremities: Mild bilateral LE edema.  Neurological: Alert oriented x 4. No focal deficits.  Psychological: Alert and cooperative. Normal mood and affect  Assessment and Recommendations:  58) 71 year old female with dysphagia, voice change, globus sensation and regurgitation. Normal modified barium swallow study with tablet. EGD in 2013 showed a 2 cm hiatal hernia, a benign flat polyp 3-110m was found in the middle third of the esophagus and gastritis. No evidence of H. Pylori. Chronic NSAID use.  -EGD benefits and risks discussed including risk with sedation, risk of bleeding, perforation and infection. EGD to be scheduled after cardiac clearance received. -Avoid eating large pieces of bread/meat, cut food into small pieces and chew food thoroughly  -Continue Lansoprazole 128mpo QD, may increase to bid as needed  -Avoid Ibuprofen use  2) History of colon polyps.  Three tubular adenomatous, sessile serrated and hyperplastic polyps were removed from the colon and sigmoid diverticulosis identified per colonoscopy 05/2016. History of ischemic colitis 01/2019, presumed diverticulitis 01/2020. -Colonoscopy benefits and risks discussed including risk with sedation, risk of bleeding, perforation and infection. Colonoscopy to be scheduled after cardiac clearance received.  3) Anemia. Hg 11.2  (baseline Hg 12.7 - 13.4). MCV 78.3. No overt GI bleeding. Chronic NSAID use.  -CBC, IBC + Ferritin  -EGD and colonoscopy as ordered above   4) SOB secondary to acute on chronic diastolic CHF, suspect iron deficiency anemia a contributing factor. -Cardiac clearance prior to proceeding with an EGD and colonoscopy requested due recent flare of acute diastolic CHF  5) Elevated Alk phos 162 -> 174. Normal T. Bili, AST/ALT levels -Hepatic panel, GGT -If GGT elevated, liver imaging will be ordered   6) Leukocytosis, likely inflammatory response. -CBC as ordered above   7) Chronic constipation  -Miralax Q HS as needed  ADDENDUM: Cardiac clearance per Dr. NaAcie Fredricksoneceived 07/31/2022 as follows: She needs EGD and colonoscopy. She is at low risk for her upcoming GI procedures   Note, cardiac clearance 08/08/2022 also provided for for her upcoming cataract surgery.   Updated cardiology clearance  by Dr. NaAcie Fredricksoneceived  09/04/2022 as follows:  "DeAleyssas seen for follow up of her chronic diastilic CHF Has had anemia  She is been having some episodes of chest pain.  Coronary CT angiogram performed on August 27, 2022 reveals a coronary calcium score of 0.  She has normal coronary arteries. She was started on Isosorbide but she stopped due to the severe headache    The GI appointments were not kept because of that her episode of chest pain.  We now have ruled out severe coronary artery disease.  She is cleared to have her colonoscopy and upper GI endoscopy for further evaluation of her GI bleeding".

## 2022-07-28 DIAGNOSIS — R1310 Dysphagia, unspecified: Secondary | ICD-10-CM | POA: Insufficient documentation

## 2022-07-28 DIAGNOSIS — D509 Iron deficiency anemia, unspecified: Secondary | ICD-10-CM | POA: Insufficient documentation

## 2022-07-28 DIAGNOSIS — D649 Anemia, unspecified: Secondary | ICD-10-CM | POA: Insufficient documentation

## 2022-07-30 ENCOUNTER — Other Ambulatory Visit: Payer: Self-pay

## 2022-07-30 DIAGNOSIS — D509 Iron deficiency anemia, unspecified: Secondary | ICD-10-CM

## 2022-07-30 MED ORDER — FERROUS SULFATE 325 (65 FE) MG PO TBEC: 325.0000 mg | DELAYED_RELEASE_TABLET | Freq: Every day | ORAL | 0 refills | Status: DC

## 2022-07-30 NOTE — Telephone Encounter (Signed)
Patient has upcoming coloscopy/EGD planned. He was recently seen by Dr. Acie Fredrickson on 07/23/2022 for follow-up of CHF. Torsemide was decreased and she was added on Spironolactone. There was also plans for coronary CTA once she was better from a CHF standpoint and HR slower. She is scheduled to see Dr. Acie Fredrickson again tomorrow. Therefore, pre-op evaluation can be addressed at that time. I will route this clearance form to Dr. Acie Fredrickson so he is aware and remove from pre-op pool.  Darreld Mclean, PA-C 07/30/2022 8:36 AM

## 2022-07-30 NOTE — Progress Notes (Signed)
Agree with assessment and plan as outlined. Needs EGD and colonoscopy for IDA and B12 deficiency, although requesting cardiac clearance first. GGT is normal, thus AP does not appear related to her liver / biliary tree.

## 2022-07-31 ENCOUNTER — Other Ambulatory Visit: Payer: Medicare Other

## 2022-07-31 ENCOUNTER — Ambulatory Visit: Payer: Medicare Other | Admitting: Cardiovascular Disease

## 2022-07-31 ENCOUNTER — Encounter: Payer: Self-pay | Admitting: Cardiovascular Disease

## 2022-07-31 VITALS — BP 112/72 | HR 91 | Ht 64.5 in | Wt 225.6 lb

## 2022-07-31 DIAGNOSIS — I5032 Chronic diastolic (congestive) heart failure: Secondary | ICD-10-CM

## 2022-07-31 MED ORDER — METOPROLOL SUCCINATE ER 50 MG PO TB24: 50.0000 mg | ORAL_TABLET | Freq: Every day | ORAL | 3 refills | Status: DC

## 2022-07-31 NOTE — Progress Notes (Signed)
Cardiology Office Note:    Date:  07/31/2022   ID:  Denise [name], DOB 14-Jul-1951, MRN [id number]  PCP:  Pleas Koch, NP   [facility] Providers Cardiologist:  None     Referring MD: Pleas Koch, NP   Chief Complaint  Patient presents with   Congestive Heart Failure            Problem list   LBBB Dyspnea TIA   History of Present Illness:    Denise [name] is a 71 y.o. female with a hx of obesity, dyspnea, TIA, LBBB,   We were asked to see her by [name] for further evaluation of her shortness of breath   Echocardiogram from July 09, 2022 reveals normal LVEF of 55 to 60%. She has no significant valvular disease.  There is trivial  tricuspid regurgitation.  Has had LBBB 3-4 years ago Runs out of breath while walking and while dressing  Unable to take a deep breath  ( Has pulmonary appt in Aug)   Leg edema a year ago.  Edema is gradually worsening  She took lasix for 5 days ( does not remember the dose )  Does not think it helped.   Has 2 pillow orthopnea  Worsening over the past several months   25 lb weight gain over the past year Has not been able to walk much   Does not add much salt.  Is recently trying to avoid processed food   + fam hx of CAD Father had MI in mid 47's  Grandmother had stroke    Review of the labs from her primary medical doctor from June 27 shows a white blood cell count of 13.7.  Chest x-ray revealed diffuse infiltrates. CT scan showed ground glass appearance   Aug. 7, 2023 [name] is seen for a 1 week follow up of her CHF We changed her lasix to torsemide Started Entresto 49-51 BID, Jardiance 10 mg a day   Echo 07/09/22:    Normal LV systolic function - EF 18-29% No significant valvular disease Coronary CTA has been ordered but not scheduled yet  Feels much better  Is urinating more . Is feeling washed out ( likely from the torsemide therapy)   Wt is 226 lbs ( down 6 lbs from  last week )  Diet is better. Less salt  Potassium level was 4.0 on August 3.  This was 3 to 4 days after starting her torsemide and potassium therapy. Echocardiogram from July 24 shows normal left ventricular systolic function.  We were not able to assess her diastolic function. No significant mitral valve disease or aortic valve disease. Very minimal dilatation of the ascending aorta. RV function was normal  Does not have orthopnea any longer, still using 2 pillows   July 31, 2022: Denise [name] seen today for follow-up visit.  She has acute on chronic   diastolic congestive heart failure. Her dyspnea continues to improve .   Has been found to be anemic  Started iron  Is still fatigued  No longer has orthopnea  She needs EGD and colonoscopy  She is at low risk for her upcoming GI procedures    Past Medical History:  Diagnosis Date   Allergy    seasonal   Arthritis    Bursitis of hip    Left   Constipation    90%of the time c/o constipation, occ has diarrhea - uses stool softener    Endometriosis    Facial  flushing 05/12/2014   Fibroid    GERD (gastroesophageal reflux disease)    Hematochezia    Hiatal hernia    HSV-1 (herpes simplex virus 1) infection    Hyperlipidemia    Hyperplastic colon polyp    Hypertension    Hypothyroidism    Ischemic colitis ([place]) 01/2019   LBBB (left bundle branch block)    Low back pain    Migraine    Osteopenia 2008   -1.2 FEMORAL NECK   Prediabetes    Stroke [facility])    TIA   TIA (transient ischemic attack) 08-10-2013    Past Surgical History:  Procedure Laterality Date   ABDOMINAL HYSTERECTOMY  1989   TAH, BSO   CHOLECYSTECTOMY  2003   COLONOSCOPY     KNEE SURGERY  2004   right   NASAL SEPTUM SURGERY  2012   PITUITARY SURGERY  2001   POLYPECTOMY     SHOULDER SURGERY  2007   right   THYROID LOBECTOMY  2002    Current Medications: Current Meds  Medication Sig   aspirin 81 MG tablet Take 81 mg by mouth daily.   atorvastatin  (LIPITOR) 10 MG tablet TAKE 1 TABLET BY MOUTH  DAILY FOR CHOLESTEROL   buPROPion ER (WELLBUTRIN SR) 100 MG 12 hr tablet TAKE 1 TABLET (100 MG TOTAL) BY MOUTH 2 (TWO) TIMES DAILY. FOR DEPRESSION   Cholecalciferol (D3) 10 MCG (400 UNIT) CHEW Chew by mouth.   Docusate Calcium (STOOL SOFTENER PO) Take 1 tablet by mouth as needed.   DULoxetine (CYMBALTA) 60 MG capsule TAKE 1 CAPSULE BY MOUTH  DAILY FOR HEADACHE  PREVENTION   empagliflozin (JARDIANCE) 10 MG TABS tablet Take 1 tablet (10 mg total) by mouth daily before breakfast.   ferrous sulfate 325 (65 FE) MG EC tablet Take 1 tablet (325 mg total) by mouth daily with breakfast.   lansoprazole (EQ LANSOPRAZOLE) 15 MG capsule Take 15 mg by mouth 2 (two) times daily.   levothyroxine (SYNTHROID) 75 MCG tablet TAKE 1 TABLET BY MOUTH IN  THE MORNING ON AN EMPTY  STOMACH WITH WATER ONLY. NO FOOD OR OTHER MEDICATIONS  FOR 1/2 HOUR   magnesium 30 MG tablet Take 30 mg by mouth daily.   metoprolol succinate (TOPROL-XL) 25 MG 24 hr tablet TAKE 1 TABLET BY MOUTH DAILY FOR BLOOD PRESSURE   mirtazapine (REMERON) 7.5 MG tablet TAKE 1 TABLET (7.5 MG TOTAL) BY MOUTH AT BEDTIME. FOR SLEEP   pantoprazole (PROTONIX) 40 MG tablet 1 pill By Mouth Every Morning   potassium chloride (KLOR-CON) 10 MEQ tablet Take 1 tablet by mouth twice daily on Mondays, Wednesdays, and Fridays   rizatriptan (MAXALT) 5 MG tablet Take 1 tablet by mouth at migraine onset. May repeat with 1 tablet in 2 hours if needed.   sacubitril-valsartan (ENTRESTO) 49-51 MG Take 1 tablet by mouth 2 (two) times daily.   spironolactone (ALDACTONE) 25 MG tablet Take 1 tablet (25 mg total) by mouth daily.   torsemide (DEMADEX) 20 MG tablet Take 1 tablet by mouth on Mondays/Wednesdays/Fridays     Allergies:   Tramadol   Social History   Socioeconomic History   Marital status: Divorced    Spouse name: Not on file   Number of children: 2   Years of education: Not on file   Highest education level: Not on  file  Occupational History    Employer: RETIRED  Tobacco Use   Smoking status: Never   Smokeless tobacco: Never  Vaping Use  Vaping Use: Never used  Substance and Sexual Activity   Alcohol use: Yes    Alcohol/week: 0.0 standard drinks of alcohol    Comment: 1 every 3 months   Drug use: No   Sexual activity: Not Currently    Comment: intercourse age 42, more than 5 sexual partners,des neg  Other Topics Concern   Not on file  Social History Narrative   Single.   2 daughters, 4 grandchildren.   Once worked for [employer].   Enjoys Ambulance person.    Social Determinants of Health   Financial Resource Strain: Low Risk  (12/28/2021)   Overall Financial Resource Strain (CARDIA)    Difficulty of Paying Living Expenses: Not hard at all  Food Insecurity: No Food Insecurity (01/26/2019)   Hunger Vital Sign    Worried About Running Out of Food in the Last Year: Never true    Ran Out of Food in the Last Year: Never true  Transportation Needs: No Transportation Needs (01/26/2019)   PRAPARE - Hydrologist (Medical): No    Lack of Transportation (Non-Medical): No  Physical Activity: Unknown (01/26/2019)   Exercise Vital Sign    Days of Exercise per Week: Patient refused    Minutes of Exercise per Session: Patient refused  Stress: No Stress Concern Present (01/26/2019)   [place]    Feeling of Stress : Not at all  Social Connections: Not on file     Family History: The patient's family history includes Breast cancer in her maternal aunt; Cancer in her mother; Heart disease in her father; Parkinson's disease in her sister. There is no history of Colon cancer, Colon polyps, Rectal cancer, Stomach cancer, Thyroid disease, Esophageal cancer, or Pancreatic cancer.  ROS:   Please see the history of present illness.     All other systems reviewed and are  negative.  EKGs/Labs/Other Studies Reviewed:    The following studies were reviewed today:   EKG:       Recent Labs: 06/12/2022: Pro B Natriuretic peptide (BNP) 38.0 07/16/2022: TSH 2.210 07/19/2022: BUN 17; Creatinine, Ser 1.02; Magnesium 2.2; Potassium 4.0; Sodium 137 07/27/2022: ALT 10; Hemoglobin 11.7; Platelets 359.0  Recent Lipid Panel    Component Value Date/Time   CHOL 110 07/12/2022 1236   TRIG 163.0 (H) 07/12/2022 1236   HDL 42.60 07/12/2022 1236   CHOLHDL 3 07/12/2022 1236   VLDL 32.6 07/12/2022 1236   LDLCALC 34 07/12/2022 1236   LDLDIRECT 72.0 06/23/2021 1245     Risk Assessment/Calculations:           Physical Exam: Blood pressure 112/72, pulse 91, height 5' 4.5" (1.638 m), weight 225 lb 9.6 oz (102.3 kg), SpO2 97 %.  GEN:  Well nourished, well developed in no acute distress HEENT: Normal NECK: No JVD; No carotid bruits LYMPHATICS: No lymphadenopathy CARDIAC: RRR , no murmurs, rubs, gallops RESPIRATORY:  Clear to auscultation without rales, wheezing or rhonchi  ABDOMEN: Soft, non-tender, non-distended MUSCULOSKELETAL:  No edema; No deformity  SKIN: Warm and dry NEUROLOGIC:  Alert and oriented x 3    ASSESSMENT:    No diagnosis found.   PLAN:      Acute on chronic diastolic congestive heart failure :     On spironolactone 25 a day  jardiance , metoprolol XL 25 a day , entresto 49-51 BD  Increase Toprol XL to 50 mg a day.  Overall she seems making  progress.  She is breathing better. We will anticipate getting a coronary CT angiogram in approximately 4 to 6 weeks.   2.  Possible coronary artery disease:    3.  Tachycardia:      4..  Iron deficient anemia.  She needs EGD and colonoscopy  She is at low risk for her upcoming GI procedures           Medication Adjustments/Labs and Tests Ordered: Current medicines are reviewed at length with the patient today.  Concerns regarding medicines are outlined above.  No orders of the defined  types were placed in this encounter.  No orders of the defined types were placed in this encounter.   There are no Patient Instructions on file for this visit.   Signed, Mertie Moores, MD  07/31/2022 3:30 PM    [place]

## 2022-07-31 NOTE — Telephone Encounter (Signed)
   Patient Name: Denise [name]  DOB: 15-Dec-1951 MRN: [id number]  Primary Cardiologist: Mertie Moores, MD  Chart reviewed as part of pre-operative protocol coverage. Given past medical history and time since last visit, based on ACC/AHA guidelines, and per Dr. Acie Fredrickson, primary cardiologist, [name] would be at acceptable risk for the planned procedure without further cardiovascular testing.   I will route this recommendation to the requesting party via Epic fax function and remove from pre-op pool.  Please call with questions.  Lenna Sciara, NP 07/31/2022, 5:24 PM

## 2022-07-31 NOTE — Patient Instructions (Signed)
Medication Instructions:  INCREASE Metoprolol Succinate to '50mg'$  daily STOP Torsemide STOP Potassium chloride *If you need a refill on your cardiac medications before your next appointment, please call your pharmacy*   Lab Work: NONE If you have labs (blood work) drawn today and your tests are completely normal, you will receive your results only by: [place] (if you have MyChart) OR A paper copy in the mail If you have any lab test that is abnormal or we need to change your treatment, we will call you to review the results.   Testing/Procedures: NONE   Follow-Up: At [facility], you and your health needs are our priority.  As part of our continuing mission to provide you with exceptional heart care, we have created designated Provider Care Teams.  These Care Teams include your primary Cardiologist (physician) and Advanced Practice Providers (APPs -  Physician Assistants and Nurse Practitioners) who all work together to provide you with the care you need, when you need it.  We recommend signing up for the patient portal called "MyChart".  Sign up information is provided on this After Visit Summary.  MyChart is used to connect with patients for Virtual Visits (Telemedicine).  Patients are able to view lab/test results, encounter notes, upcoming appointments, etc.  Non-urgent messages can be sent to your provider as well.   To learn more about what you can do with MyChart, go to [email or web address].    Your next appointment:   5 week(s)  The format for your next appointment:   In Person  Provider:   Mertie Moores, MD    Important Information About Sugar

## 2022-08-01 LAB — BASIC METABOLIC PANEL
BUN/Creatinine Ratio: 17 (ref 12–28)
BUN: 18 mg/dL (ref 8–27)
CO2: 25 mmol/L (ref 20–29)
Calcium: 9.5 mg/dL (ref 8.7–10.3)
Chloride: 102 mmol/L (ref 96–106)
Creatinine, Ser: 1.07 mg/dL — ABNORMAL HIGH (ref 0.57–1.00)
Glucose: 96 mg/dL (ref 70–99)
Potassium: 4 mmol/L (ref 3.5–5.2)
Sodium: 141 mmol/L (ref 134–144)
eGFR: 56 mL/min/{1.73_m2} — ABNORMAL LOW (ref >59)

## 2022-08-02 ENCOUNTER — Encounter: Payer: Self-pay | Admitting: Gastroenterology

## 2022-08-02 ENCOUNTER — Telehealth: Payer: Self-pay | Admitting: Nurse Practitioner

## 2022-08-02 DIAGNOSIS — K5900 Constipation, unspecified: Secondary | ICD-10-CM

## 2022-08-02 DIAGNOSIS — R1310 Dysphagia, unspecified: Secondary | ICD-10-CM

## 2022-08-02 DIAGNOSIS — D509 Iron deficiency anemia, unspecified: Secondary | ICD-10-CM

## 2022-08-02 DIAGNOSIS — Z860100 Personal history of colon polyps, unspecified: Secondary | ICD-10-CM

## 2022-08-02 NOTE — Telephone Encounter (Signed)
.  A user error has taken place: error

## 2022-08-02 NOTE — Telephone Encounter (Signed)
Patient called and scheduled her endo and colon for 9/8 at 1:30. Patient is in need of instructions. Please advise.

## 2022-08-03 ENCOUNTER — Ambulatory Visit: Payer: Medicare Other | Admitting: Cardiology

## 2022-08-07 ENCOUNTER — Other Ambulatory Visit: Payer: Self-pay | Admitting: Primary Care

## 2022-08-07 DIAGNOSIS — R748 Abnormal levels of other serum enzymes: Secondary | ICD-10-CM

## 2022-08-07 DIAGNOSIS — H2513 Age-related nuclear cataract, bilateral: Secondary | ICD-10-CM | POA: Diagnosis not present

## 2022-08-07 DIAGNOSIS — H18413 Arcus senilis, bilateral: Secondary | ICD-10-CM | POA: Diagnosis not present

## 2022-08-07 DIAGNOSIS — H2512 Age-related nuclear cataract, left eye: Secondary | ICD-10-CM | POA: Diagnosis not present

## 2022-08-07 DIAGNOSIS — H25043 Posterior subcapsular polar age-related cataract, bilateral: Secondary | ICD-10-CM | POA: Diagnosis not present

## 2022-08-07 DIAGNOSIS — H25013 Cortical age-related cataract, bilateral: Secondary | ICD-10-CM | POA: Diagnosis not present

## 2022-08-08 ENCOUNTER — Encounter: Payer: Self-pay | Admitting: Cardiovascular Disease

## 2022-08-08 ENCOUNTER — Telehealth: Payer: Self-pay | Admitting: *Deleted

## 2022-08-08 DIAGNOSIS — I209 Angina pectoris, unspecified: Secondary | ICD-10-CM

## 2022-08-08 MED ORDER — ISOSORBIDE MONONITRATE ER 30 MG PO TB24: 30.0000 mg | ORAL_TABLET | Freq: Every day | ORAL | 3 refills | Status: DC

## 2022-08-08 MED ORDER — NITROGLYCERIN 0.4 MG SL SUBL: SUBLINGUAL_TABLET | SUBLINGUAL | 6 refills | Status: DC

## 2022-08-08 NOTE — Telephone Encounter (Signed)
   Pre-operative Risk Assessment    Patient Name: Denise [name]  DOB: December 23, 1950 MRN: [id number]      Request for Surgical Clearance    Procedure:   CATARACT EXTRACTION WITH INTRAOCULAR LENS IMPLANT OF THE LEFT EYE FOLLOWED BY THE RIGHT EYE  Date of Surgery:  Clearance 10/22/22                                 Surgeon:  DR. Darleen Crocker Surgeon's Group or Practice Name:  [facility] Phone number:  [phone] Fax number:  [phone]   Type of Clearance Requested:   - Medical ; PER CLEARANCE FORM THE PT DOES NOT NEED TO STOP ANY MEDICATIONS, INCLUDING BLOOD THINNERS   Type of Anesthesia:   TOPICAL ANESTHESIA WITH IV MEDICATION   Additional requests/questions:    Jiles Prows   08/08/2022, 10:07 AM

## 2022-08-08 NOTE — Telephone Encounter (Signed)
   Patient Name: Denise [name]  DOB: 11/07/1951 MRN: [id number]  Primary Cardiologist: Mertie Moores, MD  Chart reviewed as part of pre-operative protocol coverage. Cataract extractions are recognized in guidelines as low risk surgeries that do not typically require specific preoperative testing or holding of blood thinner therapy. Therefore, given past medical history and time since last visit, based on ACC/AHA guidelines, Khamil Lamica would be at acceptable risk for the planned procedure without further cardiovascular testing.   I will route this recommendation to the requesting party via Epic fax function and remove from pre-op pool.  Please call with questions.  Deberah Pelton, NP 08/08/2022, 1:15 PM

## 2022-08-08 NOTE — Addendum Note (Signed)
Addended by: Ma Hillock on: 08/08/2022 02:36 PM   Modules accepted: Orders

## 2022-08-10 ENCOUNTER — Other Ambulatory Visit: Payer: Self-pay | Admitting: Primary Care

## 2022-08-10 ENCOUNTER — Other Ambulatory Visit (HOSPITAL_COMMUNITY): Payer: Medicare Other

## 2022-08-10 ENCOUNTER — Encounter: Payer: Self-pay | Admitting: Cardiovascular Disease

## 2022-08-10 DIAGNOSIS — E785 Hyperlipidemia, unspecified: Secondary | ICD-10-CM

## 2022-08-10 DIAGNOSIS — E039 Hypothyroidism, unspecified: Secondary | ICD-10-CM

## 2022-08-10 MED ORDER — NA SULFATE-K SULFATE-MG SULF 17.5-3.13-1.6 GM/177ML PO SOLN: 1.0000 | Freq: Once | ORAL | 0 refills | Status: AC

## 2022-08-10 NOTE — Telephone Encounter (Signed)
Patient called and still needs instructions for her procedure on 9/8. Please advise, thank you

## 2022-08-10 NOTE — Telephone Encounter (Signed)
Informed patient instructions sent via mychart.

## 2022-08-12 NOTE — Telephone Encounter (Signed)
Please notify patient that I would like to check in with her in a few months, do a general follow-up visit.  Please schedule her at the end of a session on any day in October.

## 2022-08-13 NOTE — Telephone Encounter (Signed)
LVM for patient to call and schedule

## 2022-08-16 ENCOUNTER — Other Ambulatory Visit: Payer: Self-pay | Admitting: Primary Care

## 2022-08-16 ENCOUNTER — Other Ambulatory Visit: Payer: Self-pay

## 2022-08-16 DIAGNOSIS — F419 Anxiety disorder, unspecified: Secondary | ICD-10-CM

## 2022-08-16 DIAGNOSIS — E039 Hypothyroidism, unspecified: Secondary | ICD-10-CM

## 2022-08-16 DIAGNOSIS — E785 Hyperlipidemia, unspecified: Secondary | ICD-10-CM

## 2022-08-16 MED ORDER — EMPAGLIFLOZIN 10 MG PO TABS: 10.0000 mg | ORAL_TABLET | Freq: Every day | ORAL | 3 refills | Status: DC

## 2022-08-17 ENCOUNTER — Encounter: Payer: Self-pay | Admitting: Gastroenterology

## 2022-08-21 ENCOUNTER — Other Ambulatory Visit: Payer: Self-pay | Admitting: Primary Care

## 2022-08-21 DIAGNOSIS — F32A Depression, unspecified: Secondary | ICD-10-CM

## 2022-08-24 ENCOUNTER — Encounter (HOSPITAL_COMMUNITY): Payer: Self-pay

## 2022-08-24 ENCOUNTER — Telehealth (HOSPITAL_COMMUNITY): Payer: Self-pay | Admitting: *Deleted

## 2022-08-24 ENCOUNTER — Encounter: Payer: Self-pay | Admitting: Gastroenterology

## 2022-08-24 ENCOUNTER — Ambulatory Visit: Payer: Medicare Other | Admitting: Gastroenterology

## 2022-08-24 ENCOUNTER — Telehealth: Payer: Self-pay

## 2022-08-24 VITALS — BP 120/75 | HR 74 | Temp 97.8°F | Ht 64.0 in | Wt 228.0 lb

## 2022-08-24 DIAGNOSIS — R1310 Dysphagia, unspecified: Secondary | ICD-10-CM

## 2022-08-24 DIAGNOSIS — Z86010 Personal history of colonic polyps: Secondary | ICD-10-CM

## 2022-08-24 DIAGNOSIS — D509 Iron deficiency anemia, unspecified: Secondary | ICD-10-CM

## 2022-08-24 DIAGNOSIS — K219 Gastro-esophageal reflux disease without esophagitis: Secondary | ICD-10-CM

## 2022-08-24 MED ORDER — METOPROLOL TARTRATE 50 MG PO TABS: ORAL_TABLET | ORAL | 0 refills | Status: DC

## 2022-08-24 NOTE — Telephone Encounter (Signed)
-----   Message from Yetta Flock, MD sent at 08/24/2022  1:47 PM EDT ----- Regarding: reschedule EGD and colon Denise [name], This patient was scheduled for an EGD and colon in the [facility].  She did receive cardiac clearance for this but has since experienced chest pains and awaiting a cardiac CT for Monday.  Unfortunately she prepped and showed up today, her procedures were canceled.  Just want to put this on your radar, we need to keep an eye out for her cardiac CT on Monday, then can make a determination for rescheduling her procedures hopefully in the next week or 2 if she is cleared.  Thanks

## 2022-08-24 NOTE — Progress Notes (Signed)
Patient presents to the [facility] today for EGD and colonoscopy.  She has an iron deficiency and B12 deficiency with anemia, dysphagia, GERD, history of colon polyps as indication.  She has good indications for these exams.  She was seen by cardiology on August 15, history of diastolic dysfunction/CHF, left bundle, with worsening dyspnea.  She had an echocardiogram in July showing an EF of 55 to 60%.  She was scheduled for a coronary CT scan to further evaluate, this is scheduled for September 11.  Unfortunately since she seen the cardiologist she has been having intermittent chest pain which she messaged him about on August 23.  She was prescribed some nitroglycerin but has not been taking it.  She has been having intermittent chest pains that do not seem exertional, are sporadic, but unclear etiology.  Discussed the situation with her.  While she needs her EGD and colonoscopy, her chest symptoms take priority and think her cardiac CT should be done prior to proceeding with colonoscopy and EGD in the [place].  Given her symptoms she is in agreement with this, we will cancel her procedures for today.  She is to take her nitroglycerin as needed as recommended by her cardiologist, await the coronary CT on Monday, pending that result will reschedule her if cleared.  If she needs an intervention on high-grade stenosis etc. she should proceed with that first.   We will touch base with her next week when she has results of her coronary CT and determine scheduling based on that result.  She agrees

## 2022-08-24 NOTE — Progress Notes (Signed)
  Pt stated she started having chest pain in August and messaged her doctor, doctor  ordered a Cardiac CT for 08/27/22 and ordered her a medication that she has not started. CRNA made aware.    Per Dr Havery Moros, pt instructed to call to reschedule after she receives the results of the cardiac CT. Pt verbalized understanding.

## 2022-08-24 NOTE — Telephone Encounter (Signed)
Reaching out to patient to offer assistance regarding upcoming cardiac imaging study; pt verbalizes understanding of appt date/time, parking situation and where to check in, medications ordered, and verified current allergies; name and call back number provided for further questions should they arise  Gordy Clement RN Navigator Cardiac Imaging [facility] [phone] office [phone] cell  Patient to take '50mg'$  metoprolol tartrate two hours prior to her cardiac CT scan.Patient confirms her HR in the 70's since her recent increase of metoprolol succinate.  She is aware to arrive at 2pm.

## 2022-08-27 ENCOUNTER — Ambulatory Visit (HOSPITAL_COMMUNITY)
Admission: RE | Admit: 2022-08-27 | Discharge: 2022-08-27 | Disposition: A | Discharge status: Home / Self Care | Payer: Medicare Other | Source: Ambulatory Visit | Attending: Cardiovascular Disease | Admitting: Cardiovascular Disease

## 2022-08-27 DIAGNOSIS — R072 Precordial pain: Secondary | ICD-10-CM | POA: Insufficient documentation

## 2022-08-27 MED ORDER — NITROGLYCERIN 0.4 MG SL SUBL
0.8000 mg | SUBLINGUAL_TABLET | Freq: Once | SUBLINGUAL | Status: AC
  Administered 2022-08-27: 0.8 mg via SUBLINGUAL

## 2022-08-27 MED ORDER — IOHEXOL 350 MG/ML SOLN
100.0000 mL | Freq: Once | INTRAVENOUS | Status: AC | PRN
  Administered 2022-08-27: 100 mL via INTRAVENOUS

## 2022-08-27 MED ORDER — NITROGLYCERIN 0.4 MG SL SUBL
SUBLINGUAL_TABLET | SUBLINGUAL | Status: AC
  Filled 2022-08-27: qty 2

## 2022-08-28 ENCOUNTER — Telehealth: Payer: Self-pay

## 2022-08-28 NOTE — Telephone Encounter (Signed)
Patient completed cardiac CT yesterday. Report is available in epic, but has not been reviewed by ordering provider. Thanks

## 2022-08-28 NOTE — Telephone Encounter (Signed)
Thanks [employer].  Her cardiac CT has a calcium score of 0, making it very unlikely she has any significant coronary artery disease causing her symptoms.  I would assume she will be cleared to proceed for her procedures with [place].  That being said I will await her cardiologist to comment on the results and recommendations.  Hopefully she can get an answer in the next day or 2.  I have an opening to do a double procedure next Tuesday PM if she is interested in proceeding at that time, if cardiology says she is cleared to do that.  Can you see if she is free that day and add her to the schedule if cardiology says okay?  Thanks

## 2022-08-28 NOTE — Telephone Encounter (Signed)
[facility] Pre-operative Risk Assessment     Request for surgical clearance:     Endoscopy Procedure  What type of surgery is being performed?     EGD and colonoscopy   When is this surgery scheduled?     TBD  What type of clearance is required ?   Medical clearance after cardiac CT  Are there any medications that need to be held prior to surgery and how long? N/A  Practice name and name of physician performing surgery?      [facility]  What is your office phone and fax number?      Phone- [phone]  [phone]  Anesthesia type (None, local, MAC, general) ?       MAC

## 2022-08-29 NOTE — Telephone Encounter (Signed)
   Name: Shardai Star  DOB: [birth year]  MRN: [id number]  Primary Cardiologist: Mertie Moores, MD  Chart reviewed as part of pre-operative protocol coverage. The patient has an upcoming visit scheduled with Dr. Acie Fredrickson on 09/04/2022 at which time clearance can be addressed in case there are any issues that would impact surgical recommendations.  Endoscopy is not scheduled until date TBD as below. I added preop FYI to appointment note so that provider is aware to address at time of outpatient visit.  Per office protocol the cardiology provider should forward their finalized clearance decision and recommendations regarding antiplatelet therapy to the requesting party below.    I will route this message as FYI to requesting party and remove this message from the preop box as separate preop APP input not needed at this time.   Please call with any questions.  Lenna Sciara, NP  08/29/2022, 12:02 PM

## 2022-08-29 NOTE — Telephone Encounter (Signed)
See cardiac clearance telephone encounter. Patient has a cardiology appt on 9/19 and pre-op clearance will be discussed at that time.

## 2022-08-30 NOTE — Telephone Encounter (Signed)
Okay thanks for clarifying, appreciate it

## 2022-09-03 ENCOUNTER — Encounter: Payer: Self-pay | Admitting: Cardiovascular Disease

## 2022-09-03 ENCOUNTER — Ambulatory Visit: Payer: Medicare Other

## 2022-09-03 NOTE — Progress Notes (Unsigned)
Cardiology Office Note:    Date:  09/04/2022   ID:  Denise [name], DOB [birth year], MRN [id number]  PCP:  Pleas Koch, NP   [facility] Cardiologist:  Mertie Moores, MD     Referring MD: Pleas Koch, NP   Chief Complaint  Patient presents with   Congestive Heart Failure       Problem list   LBBB Dyspnea TIA   History of Present Illness:    Denise [name] is a 71 y.o. female with a hx of obesity, dyspnea, TIA, LBBB,   We were asked to see her by [name] for further evaluation of her shortness of breath   Echocardiogram from July 09, 2022 reveals normal LVEF of 55 to 60%. She has no significant valvular disease.  There is trivial  tricuspid regurgitation.  Has had LBBB 3-4 years ago Runs out of breath while walking and while dressing  Unable to take a deep breath  ( Has pulmonary appt in Aug)   Leg edema a year ago.  Edema is gradually worsening  She took lasix for 5 days ( does not remember the dose )  Does not think it helped.   Has 2 pillow orthopnea  Worsening over the past several months   25 lb weight gain over the past year Has not been able to walk much   Does not add much salt.  Is recently trying to avoid processed food   + fam hx of CAD Father had MI in mid 75's  Grandmother had stroke    Review of the labs from her primary medical doctor from June 27 shows a white blood cell count of 13.7.  Chest x-ray revealed diffuse infiltrates. CT scan showed ground glass appearance   Aug. 7, 2023 [name] is seen for a 1 week follow up of her CHF We changed her lasix to torsemide Started Entresto 49-51 BID, Jardiance 10 mg a day   Echo 07/09/22:    Normal LV systolic function - EF 73-22% No significant valvular disease Coronary CTA has been ordered but not scheduled yet  Feels much better  Is urinating more . Is feeling washed out ( likely from the torsemide therapy)   Wt is 226 lbs ( down 6 lbs  from last week )  Diet is better. Less salt  Potassium level was 4.0 on August 3.  This was 3 to 4 days after starting her torsemide and potassium therapy. Echocardiogram from July 24 shows normal left ventricular systolic function.  We were not able to assess her diastolic function. No significant mitral valve disease or aortic valve disease. Very minimal dilatation of the ascending aorta. RV function was normal  Does not have orthopnea any longer, still using 2 pillows   July 31, 2022: Denise [name] seen today for follow-up visit.  She has acute on chronic   diastolic congestive heart failure. Her dyspnea continues to improve .   Has been found to be anemic  Started iron  Is still fatigued  No longer has orthopnea  She needs EGD and colonoscopy  She is at low risk for her upcoming GI procedures    Sept. 19, 2023 Denise [name] is seen for follow up of her chronic diastilic CHF Has had anemia  She is been having some episodes of chest pain.  Coronary CT angiogram performed on August 27, 2022 reveals a coronary calcium score of 0.  She has normal coronary arteries. She was started on Isosorbide but  she stopped due to the severe headache   The GI appointments were not kept because of that her episode of chest pain.  We now have ruled out severe coronary artery disease.  She is cleared to have her colonoscopy and upper GI endoscopy for further evaluation of her GI bleeding.    Past Medical History:  Diagnosis Date   Allergy    seasonal   Arthritis    Bursitis of hip    Left   Constipation    90%of the time c/o constipation, occ has diarrhea - uses stool softener    Endometriosis    Facial flushing 05/12/2014   Fibroid    GERD (gastroesophageal reflux disease)    Hematochezia    Hiatal hernia    HSV-1 (herpes simplex virus 1) infection    Hyperlipidemia    Hyperplastic colon polyp    Hypertension    Hypothyroidism    Ischemic colitis ([place]) 01/2019   LBBB (left bundle branch  block)    Low back pain    Migraine    Osteopenia 2008   -1.2 FEMORAL NECK   Prediabetes    Stroke [facility])    TIA   TIA (transient ischemic attack) 08-10-2013    Past Surgical History:  Procedure Laterality Date   ABDOMINAL HYSTERECTOMY  1989   TAH, BSO   CHOLECYSTECTOMY  2003   COLONOSCOPY     KNEE SURGERY  2004   right   NASAL SEPTUM SURGERY  2012   PITUITARY SURGERY  2001   POLYPECTOMY     SHOULDER SURGERY  2007   right   THYROID LOBECTOMY  2002    Current Medications: Current Meds  Medication Sig   aspirin 81 MG tablet Take 81 mg by mouth daily.   atorvastatin (LIPITOR) 10 MG tablet TAKE 1 TABLET BY MOUTH  DAILY FOR CHOLESTEROL   buPROPion ER (WELLBUTRIN SR) 100 MG 12 hr tablet TAKE 1 TABLET (100 MG TOTAL) BY MOUTH 2 (TWO) TIMES DAILY. FOR DEPRESSION   Cholecalciferol (D3) 10 MCG (400 UNIT) CHEW Chew by mouth.   DULoxetine (CYMBALTA) 30 MG capsule TAKE 1 CAPSULE BY MOUTH DAILY  FOR ANXIETY AND DEPRESSION TAKE  WITH 60 MG CAPSULE   DULoxetine (CYMBALTA) 60 MG capsule TAKE 1 CAPSULE BY MOUTH  DAILY FOR HEADACHE  PREVENTION   empagliflozin (JARDIANCE) 10 MG TABS tablet Take 1 tablet (10 mg total) by mouth daily before breakfast.   ferrous sulfate 325 (65 FE) MG EC tablet Take 1 tablet (325 mg total) by mouth daily with breakfast.   lansoprazole (EQ LANSOPRAZOLE) 15 MG capsule Take 15 mg by mouth 2 (two) times daily.   levothyroxine (SYNTHROID) 75 MCG tablet TAKE 1 TABLET BY MOUTH IN  THE MORNING ON AN EMPTY  STOMACH WITH WATER ONLY. NO FOOD OR OTHER MEDICATIONS  FOR 1/2 HOUR   magnesium 30 MG tablet Take 30 mg by mouth daily.   metoprolol succinate (TOPROL-XL) 50 MG 24 hr tablet Take 1 tablet (50 mg total) by mouth daily. Take with or immediately following a meal.   mirtazapine (REMERON) 7.5 MG tablet TAKE 1 TABLET (7.5 MG TOTAL) BY MOUTH AT BEDTIME. FOR SLEEP   nitroGLYCERIN (NITROSTAT) 0.4 MG SL tablet Dissolve 1 tablet under the tongue every 5 minutes as needed for chest  pain. Max of 3 doses, then 911.   sacubitril-valsartan (ENTRESTO) 97-103 MG Take 1 tablet by mouth 2 (two) times daily.   spironolactone (ALDACTONE) 25 MG tablet Take 1 tablet (25  mg total) by mouth daily.   [DISCONTINUED] sacubitril-valsartan (ENTRESTO) 49-51 MG Take 1 tablet by mouth 2 (two) times daily.     Allergies:   Tramadol   Social History   Socioeconomic History   Marital status: Divorced    Spouse name: Not on file   Number of children: 2   Years of education: Not on file   Highest education level: Not on file  Occupational History    Employer: RETIRED  Tobacco Use   Smoking status: Never   Smokeless tobacco: Never  Vaping Use   Vaping Use: Never used  Substance and Sexual Activity   Alcohol use: Yes    Alcohol/week: 0.0 standard drinks of alcohol    Comment: 1 every 3 months   Drug use: No   Sexual activity: Not Currently    Comment: intercourse age 78, more than 5 sexual partners,des neg  Other Topics Concern   Not on file  Social History Narrative   Single.   2 daughters, 4 grandchildren.   Once worked for [employer].   Enjoys Ambulance person.    Social Determinants of Health   Financial Resource Strain: Low Risk  (12/28/2021)   Overall Financial Resource Strain (CARDIA)    Difficulty of Paying Living Expenses: Not hard at all  Food Insecurity: No Food Insecurity (01/26/2019)   Hunger Vital Sign    Worried About Running Out of Food in the Last Year: Never true    Ran Out of Food in the Last Year: Never true  Transportation Needs: No Transportation Needs (01/26/2019)   PRAPARE - Hydrologist (Medical): No    Lack of Transportation (Non-Medical): No  Physical Activity: Unknown (01/26/2019)   Exercise Vital Sign    Days of Exercise per Week: Patient refused    Minutes of Exercise per Session: Patient refused  Stress: No Stress Concern Present (01/26/2019)   [place]    Feeling of Stress : Not at all  Social Connections: Not on file     Family History: The patient's family history includes Breast cancer in her maternal aunt; Cancer in her mother; Heart disease in her father; Parkinson's disease in her sister. There is no history of Colon cancer, Colon polyps, Rectal cancer, Stomach cancer, Thyroid disease, Esophageal cancer, or Pancreatic cancer.  ROS:   Please see the history of present illness.     All other systems reviewed and are negative.  EKGs/Labs/Other Studies Reviewed:    The following studies were reviewed today:   EKG:       Recent Labs: 06/12/2022: Pro B Natriuretic peptide (BNP) 38.0 07/16/2022: TSH 2.210 07/19/2022: Magnesium 2.2 07/27/2022: ALT 10; Hemoglobin 11.7; Platelets 359.0 07/31/2022: BUN 18; Creatinine, Ser 1.07; Potassium 4.0; Sodium 141  Recent Lipid Panel    Component Value Date/Time   CHOL 110 07/12/2022 1236   TRIG 163.0 (H) 07/12/2022 1236   HDL 42.60 07/12/2022 1236   CHOLHDL 3 07/12/2022 1236   VLDL 32.6 07/12/2022 1236   LDLCALC 34 07/12/2022 1236   LDLDIRECT 72.0 06/23/2021 1245     Risk Assessment/Calculations:      Physical Exam: Blood pressure (!) 140/95, pulse 82, height 5' 4.5" (1.638 m), weight 228 lb 3.2 oz (103.5 kg), SpO2 96 %.  HYPERTENSION CONTROL Vitals:   09/04/22 1525 09/04/22 1615  BP: (!) 144/80 (!) 140/95    The patient's blood pressure is elevated above target today.  In order to address the patient's elevated BP: A current anti-hypertensive medication was adjusted today.       GEN:  Well nourished, well developed in no acute distress HEENT: Normal NECK: No JVD; No carotid bruits LYMPHATICS: No lymphadenopathy CARDIAC: RRR , no murmurs, rubs, gallops RESPIRATORY:  Clear to auscultation without rales, wheezing or rhonchi  ABDOMEN: Soft, non-tender, non-distended MUSCULOSKELETAL:  No edema; No deformity  SKIN: Warm and  dry NEUROLOGIC:  Alert and oriented x 3   ECG :     Normal sinus rhythm at 82.  Left axis deviation.  Left bundle branch block.  ASSESSMENT:    1. Chronic diastolic heart failure ([place])   2. Angina pectoris ([place])   3. Precordial pain      PLAN:      Acute on chronic diastolic congestive heart failure :  Seems to be better.   BP is still a bit elevated.  Will increase Entresto to 97-103 BID  Will see her Oct. 23.  Anticipate increasing toprol if her HR remains elevate.   2.  Chest pain: She does not have any evidence of chest pain by coronary CT scan.  e:    3.  Tachycardia: Heart rate is better on metoprolol.  We will continue to gradually titrate up her metoprolol    4..  Iron deficient anemia.  We will check hemoglobin today.  If she still anemic that would explain her ongoing resting tachycardia and mildly elevated blood pressure.          Medication Adjustments/Labs and Tests Ordered: Current medicines are reviewed at length with the patient today.  Concerns regarding medicines are outlined above.  Orders Placed This Encounter  Procedures   Basic metabolic panel   CBC   EKG 12-Lead   Meds ordered this encounter  Medications   sacubitril-valsartan (ENTRESTO) 97-103 MG    Sig: Take 1 tablet by mouth 2 (two) times daily.    Dispense:  180 tablet    Refill:  3    Patient Instructions  Medication Instructions:  STOP ISOSORBIDE  INCREASE ENTRESTO TO 97/103 TWICE DAILY  *If you need a refill on your cardiac medications before your next appointment, please call your pharmacy*   Lab Work:  CBC TODAY  BMET IN 3 WEEKS If you have labs (blood work) drawn today and your tests are completely normal, you will receive your results only by: [place] (if you have MyChart) OR A paper copy in the mail If you have any lab test that is abnormal or we need to change your treatment, we will call you to review the  results.   Testing/Procedures: NONE   Follow-Up: At [facility], you and your health needs are our priority.  As part of our continuing mission to provide you with exceptional heart care, we have created designated Provider Care Teams.  These Care Teams include your primary Cardiologist (physician) and Advanced Practice Providers (APPs -  Physician Assistants and Nurse Practitioners) who all work together to provide you with the care you need, when you need it.  We recommend signing up for the patient portal called "MyChart".  Sign up information is provided on this After Visit Summary.  MyChart is used to connect with patients for Virtual Visits (Telemedicine).  Patients are able to view lab/test results, encounter notes, upcoming appointments, etc.  Non-urgent messages can be sent to your provider as well.   To learn more about what you can do with MyChart, go  to [email or web address].    Your next appointment:  OCTOBER 23   The format for your next appointment:   In Person  Provider:   Mertie Moores, MD      Other Instructions NONE  Important Information About Sugar         Signed, Mertie Moores, MD  09/04/2022 10:27 PM    [place]

## 2022-09-04 ENCOUNTER — Encounter: Payer: Self-pay | Admitting: Cardiovascular Disease

## 2022-09-04 ENCOUNTER — Ambulatory Visit: Payer: Medicare Other | Attending: Cardiovascular Disease | Admitting: Cardiovascular Disease

## 2022-09-04 VITALS — BP 140/95 | HR 82 | Ht 64.5 in | Wt 228.2 lb

## 2022-09-04 DIAGNOSIS — R072 Precordial pain: Secondary | ICD-10-CM

## 2022-09-04 DIAGNOSIS — I5032 Chronic diastolic (congestive) heart failure: Secondary | ICD-10-CM | POA: Diagnosis not present

## 2022-09-04 DIAGNOSIS — I209 Angina pectoris, unspecified: Secondary | ICD-10-CM | POA: Diagnosis not present

## 2022-09-04 MED ORDER — SACUBITRIL-VALSARTAN 97-103 MG PO TABS: 1.0000 | ORAL_TABLET | Freq: Two times a day (BID) | ORAL | 3 refills | Status: DC

## 2022-09-04 NOTE — Patient Instructions (Signed)
Medication Instructions:  STOP ISOSORBIDE  INCREASE ENTRESTO TO 97/103 TWICE DAILY  *If you need a refill on your cardiac medications before your next appointment, please call your pharmacy*   Lab Work:  CBC TODAY  BMET IN 3 WEEKS If you have labs (blood work) drawn today and your tests are completely normal, you will receive your results only by: [place] (if you have MyChart) OR A paper copy in the mail If you have any lab test that is abnormal or we need to change your treatment, we will call you to review the results.   Testing/Procedures: NONE   Follow-Up: At [facility], you and your health needs are our priority.  As part of our continuing mission to provide you with exceptional heart care, we have created designated Provider Care Teams.  These Care Teams include your primary Cardiologist (physician) and Advanced Practice Providers (APPs -  Physician Assistants and Nurse Practitioners) who all work together to provide you with the care you need, when you need it.  We recommend signing up for the patient portal called "MyChart".  Sign up information is provided on this After Visit Summary.  MyChart is used to connect with patients for Virtual Visits (Telemedicine).  Patients are able to view lab/test results, encounter notes, upcoming appointments, etc.  Non-urgent messages can be sent to your provider as well.   To learn more about what you can do with MyChart, go to [email or web address].    Your next appointment:  OCTOBER 23   The format for your next appointment:   In Person  Provider:   Mertie Moores, MD      Other Instructions NONE  Important Information About Sugar

## 2022-09-05 LAB — CBC
Hematocrit: 41.2 % (ref 34.0–46.6)
Hemoglobin: 12.9 g/dL (ref 11.1–15.9)
MCH: 24.4 pg — ABNORMAL LOW (ref 26.6–33.0)
MCHC: 31.3 g/dL — ABNORMAL LOW (ref 31.5–35.7)
MCV: 78 fL — ABNORMAL LOW (ref 79–97)
Platelets: 363 10*3/uL (ref 150–450)
RBC: 5.28 x10E6/uL (ref 3.77–5.28)
RDW: 15.9 % — ABNORMAL HIGH (ref 11.7–15.4)
WBC: 9.6 10*3/uL (ref 3.4–10.8)

## 2022-09-14 ENCOUNTER — Other Ambulatory Visit (HOSPITAL_COMMUNITY): Payer: Medicare Other

## 2022-09-17 ENCOUNTER — Encounter: Payer: Self-pay | Admitting: Cardiovascular Disease

## 2022-09-17 MED ORDER — VALSARTAN 320 MG PO TABS: 320.0000 mg | ORAL_TABLET | Freq: Every day | ORAL | 0 refills | Status: DC

## 2022-09-17 NOTE — Telephone Encounter (Signed)
[name], [name], MD  You 3 hours ago (1:44 PM)    Lets start Valsartan 320 mg a day instead of Entresto   Prescription sent to pharmacy on file and patient made aware via [place].

## 2022-09-25 ENCOUNTER — Ambulatory Visit: Payer: Medicare Other | Attending: Cardiovascular Disease

## 2022-09-25 DIAGNOSIS — I5032 Chronic diastolic (congestive) heart failure: Secondary | ICD-10-CM

## 2022-09-25 LAB — BASIC METABOLIC PANEL
BUN/Creatinine Ratio: 18 (ref 12–28)
BUN: 18 mg/dL (ref 8–27)
CO2: 25 mmol/L (ref 20–29)
Calcium: 9.3 mg/dL (ref 8.7–10.3)
Chloride: 105 mmol/L (ref 96–106)
Creatinine, Ser: 1.02 mg/dL — ABNORMAL HIGH (ref 0.57–1.00)
Glucose: 105 mg/dL — ABNORMAL HIGH (ref 70–99)
Potassium: 4.2 mmol/L (ref 3.5–5.2)
Sodium: 142 mmol/L (ref 134–144)
eGFR: 59 mL/min/{1.73_m2} — ABNORMAL LOW (ref >59)

## 2022-09-26 ENCOUNTER — Ambulatory Visit: Payer: Medicare Other

## 2022-09-27 ENCOUNTER — Encounter: Payer: Self-pay | Admitting: Primary Care

## 2022-09-27 ENCOUNTER — Ambulatory Visit (INDEPENDENT_AMBULATORY_CARE_PROVIDER_SITE_OTHER): Payer: Medicare Other | Admitting: Primary Care

## 2022-09-27 ENCOUNTER — Ambulatory Visit: Payer: Medicare Other | Admitting: Primary Care

## 2022-09-27 ENCOUNTER — Encounter: Payer: Medicare Other | Admitting: Primary Care

## 2022-09-27 VITALS — BP 130/82 | HR 80 | Temp 96.4°F | Ht 64.5 in | Wt 228.0 lb

## 2022-09-27 DIAGNOSIS — K219 Gastro-esophageal reflux disease without esophagitis: Secondary | ICD-10-CM

## 2022-09-27 DIAGNOSIS — Z23 Encounter for immunization: Secondary | ICD-10-CM | POA: Diagnosis not present

## 2022-09-27 DIAGNOSIS — I10 Essential (primary) hypertension: Secondary | ICD-10-CM | POA: Diagnosis not present

## 2022-09-27 DIAGNOSIS — E785 Hyperlipidemia, unspecified: Secondary | ICD-10-CM

## 2022-09-27 DIAGNOSIS — E890 Postprocedural hypothyroidism: Secondary | ICD-10-CM | POA: Diagnosis not present

## 2022-09-27 DIAGNOSIS — F32A Depression, unspecified: Secondary | ICD-10-CM | POA: Diagnosis not present

## 2022-09-27 DIAGNOSIS — R7303 Prediabetes: Secondary | ICD-10-CM | POA: Diagnosis not present

## 2022-09-27 DIAGNOSIS — R0602 Shortness of breath: Secondary | ICD-10-CM

## 2022-09-27 DIAGNOSIS — Z86010 Personal history of colonic polyps: Secondary | ICD-10-CM

## 2022-09-27 DIAGNOSIS — D509 Iron deficiency anemia, unspecified: Secondary | ICD-10-CM

## 2022-09-27 DIAGNOSIS — F419 Anxiety disorder, unspecified: Secondary | ICD-10-CM

## 2022-09-27 DIAGNOSIS — G479 Sleep disorder, unspecified: Secondary | ICD-10-CM

## 2022-09-27 MED ORDER — MIRTAZAPINE 15 MG PO TABS: 15.0000 mg | ORAL_TABLET | Freq: Every day | ORAL | 0 refills | Status: DC

## 2022-09-27 NOTE — Assessment & Plan Note (Signed)
Overdue, discussed today. She will hopefully be able to proceed soon, her recent colonoscopy was cancelled due to chest pain symptoms.

## 2022-09-27 NOTE — Progress Notes (Signed)
Subjective:    Patient ID: Denise [name], female    DOB: [birth year], 71 y.o.   MRN: [id number]  HPI  Denise [name] is a very pleasant 71 y.o. female with a history of migraines, hypertension, hypothyroidism, chronic back pain, overactive bladder, hyperlipidemia, prediabetes, exertional dyspnea who presents today for follow up of chronic conditions.  1) Essential Hypertension: Currently managed on metoprolol succinate 50 mg daily, Entresto 97-103 mg BID, Jardiance 10 mg daily.  She is not taking Entresto as she is in the donut hole with medicare. She is now taking valsartan 320 mg daily. She is not taking spironolactone 25 mg.   She underwent echocardiogram in July 2023 which reveals normal LVEF of 55 to 60%.  There was no significant valvular disease, trivial tricuspid regurgitation.  Evaluated by cardiology on 09/04/2022, [name] was increased to 97-103 twice daily and metoprolol succinate 50 mg was continued.  BP Readings from Last 3 Encounters:  09/27/22 130/82  09/04/22 (!) 140/95  08/27/22 123/84     2) Anxiety and Depression: Currently manage on duloxetine 90 mg daily, bupropion SR 100 mg BID, mirtazapine 7.5 mg. Overall she's doing okay, she is mostly down given her diagnosis of CHF.   She has been taking 15 mg of mirtazapine HS for sleep which has helped.  She is now getting at least 6 hours of continuous sleep nightly.  3) Hypothyroidism: Currently managed on levothyroxine 75 mcg daily.  She is taking her levothyroxine every morning on an empty stomach with water only.   No food or other medications for 30 minutes.   No heartburn medication, iron pills, calcium, vitamin D, or magnesium pills within four hours of taking levothyroxine.     Review of Systems  Respiratory:  Negative for shortness of breath.   Cardiovascular:  Negative for chest pain.  Neurological:  Negative for dizziness and headaches.  Psychiatric/Behavioral:  Negative for sleep  disturbance. The patient is nervous/anxious.        See HPI         Past Medical History:  Diagnosis Date   Allergy    seasonal   Arthritis    Bursitis of hip    Left   Constipation    90%of the time c/o constipation, occ has diarrhea - uses stool softener    Endometriosis    Facial flushing 05/12/2014   Fibroid    GERD (gastroesophageal reflux disease)    Hematochezia    Hiatal hernia    HSV-1 (herpes simplex virus 1) infection    Hyperlipidemia    Hyperplastic colon polyp    Hypertension    Hypothyroidism    Ischemic colitis ([place]) 01/2019   LBBB (left bundle branch block)    Low back pain    Migraine    Osteopenia 2008   -1.2 FEMORAL NECK   Overdose of benzodiazepine 08/11/2021   Prediabetes    Stroke [facility])    TIA   TIA (transient ischemic attack) 08-10-2013    Social History   Socioeconomic History   Marital status: Divorced    Spouse name: Not on file   Number of children: 2   Years of education: Not on file   Highest education level: Not on file  Occupational History    Employer: RETIRED  Tobacco Use   Smoking status: Never   Smokeless tobacco: Never  Vaping Use   Vaping Use: Never used  Substance and Sexual Activity   Alcohol use: Yes    Alcohol/week: 0.0  standard drinks of alcohol    Comment: 1 every 3 months   Drug use: No   Sexual activity: Not Currently    Comment: intercourse age 58, more than 5 sexual partners,des neg  Other Topics Concern   Not on file  Social History Narrative   Single.   2 daughters, 4 grandchildren.   Once worked for [employer].   Enjoys Ambulance person.    Social Determinants of Health   Financial Resource Strain: Low Risk  (12/28/2021)   Overall Financial Resource Strain (CARDIA)    Difficulty of Paying Living Expenses: Not hard at all  Food Insecurity: No Food Insecurity (01/26/2019)   Hunger Vital Sign    Worried About Running Out of Food in the Last Year: Never true    Ran Out of  Food in the Last Year: Never true  Transportation Needs: No Transportation Needs (01/26/2019)   PRAPARE - Hydrologist (Medical): No    Lack of Transportation (Non-Medical): No  Physical Activity: Unknown (01/26/2019)   Exercise Vital Sign    Days of Exercise per Week: Patient refused    Minutes of Exercise per Session: Patient refused  Stress: No Stress Concern Present (01/26/2019)   [place]    Feeling of Stress : Not at all  Social Connections: Not on file  Intimate Partner Violence: Not on file    Past Surgical History:  Procedure Laterality Date   [place], [place]  2003   COLONOSCOPY     KNEE SURGERY  2004   right   NASAL SEPTUM SURGERY  2012   PITUITARY SURGERY  2001   POLYPECTOMY     SHOULDER SURGERY  2007   right   THYROID LOBECTOMY  2002    Family History  Problem Relation Age of Onset   Cancer Mother        brain tumor   Heart disease Father    Parkinson's disease Sister    Breast cancer Maternal Aunt    Colon cancer Neg Hx    Colon polyps Neg Hx    Rectal cancer Neg Hx    Stomach cancer Neg Hx    Thyroid disease Neg Hx    Esophageal cancer Neg Hx    Pancreatic cancer Neg Hx     Allergies  Allergen Reactions   Tramadol Other (See Comments)    Reaction: NIGHTMARES Other reaction(s): Not available    Current Outpatient Medications on File Prior to Visit  Medication Sig Dispense Refill   aspirin 81 MG tablet Take 81 mg by mouth daily.     atorvastatin (LIPITOR) 10 MG tablet TAKE 1 TABLET BY MOUTH  DAILY FOR CHOLESTEROL 90 tablet 0   buPROPion ER (WELLBUTRIN SR) 100 MG 12 hr tablet TAKE 1 TABLET (100 MG TOTAL) BY MOUTH 2 (TWO) TIMES DAILY. FOR DEPRESSION 180 tablet 0   Cholecalciferol (D3) 10 MCG (400 UNIT) CHEW Chew by mouth.     DULoxetine (CYMBALTA) 30 MG capsule TAKE 1 CAPSULE BY MOUTH DAILY  FOR ANXIETY AND  DEPRESSION TAKE  WITH 60 MG CAPSULE 90 capsule 3   DULoxetine (CYMBALTA) 60 MG capsule TAKE 1 CAPSULE BY MOUTH  DAILY FOR HEADACHE  PREVENTION 90 capsule 3   empagliflozin (JARDIANCE) 10 MG TABS tablet Take 1 tablet (10 mg total) by mouth daily before breakfast. 90 tablet 3   ferrous sulfate  325 (65 FE) MG EC tablet Take 1 tablet (325 mg total) by mouth daily with breakfast. 90 tablet 0   lansoprazole (EQ LANSOPRAZOLE) 15 MG capsule Take 15 mg by mouth 2 (two) times daily.     levothyroxine (SYNTHROID) 75 MCG tablet TAKE 1 TABLET BY MOUTH IN  THE MORNING ON AN EMPTY  STOMACH WITH WATER ONLY. NO FOOD OR OTHER MEDICATIONS  FOR 1/2 HOUR 90 tablet 0   magnesium 30 MG tablet Take 30 mg by mouth daily.     metoprolol succinate (TOPROL-XL) 50 MG 24 hr tablet Take 1 tablet (50 mg total) by mouth daily. Take with or immediately following a meal. 90 tablet 3   nitroGLYCERIN (NITROSTAT) 0.4 MG SL tablet Dissolve 1 tablet under the tongue every 5 minutes as needed for chest pain. Max of 3 doses, then 911. 25 tablet 6   valsartan (DIOVAN) 320 MG tablet Take 1 tablet (320 mg total) by mouth daily. 90 tablet 0   sacubitril-valsartan (ENTRESTO) 97-103 MG Take 1 tablet by mouth 2 (two) times daily. (Patient not taking: Reported on 09/27/2022) 180 tablet 3   spironolactone (ALDACTONE) 25 MG tablet Take 1 tablet (25 mg total) by mouth daily. 90 tablet 3   No current facility-administered medications on file prior to visit.    BP 130/82   Pulse 80   Temp (!) 96.4 F (35.8 C) (Temporal)   Ht 5' 4.5" (1.638 m)   Wt 228 lb (103.4 kg)   SpO2 98%   BMI 38.53 kg/m  Objective:   Physical Exam Cardiovascular:     Rate and Rhythm: Normal rate and regular rhythm.  Pulmonary:     Effort: Pulmonary effort is normal.     Breath sounds: Normal breath sounds.  Musculoskeletal:     Cervical back: Neck supple.  Skin:    General: Skin is warm and dry.  Psychiatric:        Mood and Affect: Mood normal.            Assessment & Plan:   Problem List Items Addressed This Visit       Cardiovascular and Mediastinum   Essential hypertension    Controlled.  Continue metoprolol succinate 50 mg daily and valsartan 320 mg daily. BMP reviewed from cardiology in October 2023.        Digestive   GERD (gastroesophageal reflux disease)    Controlled.  Continue lansoprazole 15 mg BID.          Endocrine   Hypothyroidism    She is taking levothyroxine correctly.  Continue levothyroxine 75 mcg daily. Reviewed TSH from July 2023.        Other   Hyperlipidemia    Continue atorvastatin 10 mg daily. Reviewed lipid panel from July 2023.      Prediabetes - Primary    Discussed the importance of a healthy diet and regular exercise in order for weight loss, and to reduce the risk of further co-morbidity.  Repeat A1C pending.      Relevant Orders   Hemoglobin A1c   Hx of adenomatous colonic polyps    Overdue, discussed today. She will hopefully be able to proceed soon, her recent colonoscopy was cancelled due to chest pain symptoms.      Anxiety and depression    Improving, still with symptoms. We both agreed to continue with her current regimen.  Continue duloxetine 90 mg daily, bupropion SR 100 mg twice daily, mirtazapine 15 mg at bedtime.  Relevant Medications   mirtazapine (REMERON) 15 MG tablet   Sleep disturbance    Improved.  Agree to increase mirtazapine to 15 mg HS. New Rx sent to pharmacy.      Relevant Medications   mirtazapine (REMERON) 15 MG tablet   Shortness of breath    Following with cardiology, reviewed echocardiogram from July 2023.  Resume Entresto 97-103 mg twice daily once out of donut hole. Continue valsartan 320 mg daily, metoprolol succinate 50 mg daily.  Reviewed cardiology and pulmonology notes.        Iron deficiency anemia    Repeat CBC and iron studies pending.  Continue ferrous sulfate 325 mg daily.      Relevant Orders   IBC +  Ferritin   CBC   Other Visit Diagnoses     Need for immunization against influenza       Relevant Orders   Flu Vaccine QUAD High Dose(Fluad) (Completed)          Pleas Koch, NP

## 2022-09-27 NOTE — Assessment & Plan Note (Signed)
Continue atorvastatin 10 mg daily. Reviewed lipid panel from July 2023.

## 2022-09-27 NOTE — Assessment & Plan Note (Signed)
Improved.  Agree to increase mirtazapine to 15 mg HS. New Rx sent to pharmacy.

## 2022-09-27 NOTE — Assessment & Plan Note (Signed)
Controlled.  Continue lansoprazole 15 mg BID.

## 2022-09-27 NOTE — Assessment & Plan Note (Signed)
Repeat CBC and iron studies pending.  Continue ferrous sulfate 325 mg daily.

## 2022-09-27 NOTE — Assessment & Plan Note (Signed)
Controlled.  Continue metoprolol succinate 50 mg daily and valsartan 320 mg daily. BMP reviewed from cardiology in October 2023.

## 2022-09-27 NOTE — Assessment & Plan Note (Signed)
Improving, still with symptoms. We both agreed to continue with her current regimen.  Continue duloxetine 90 mg daily, bupropion SR 100 mg twice daily, mirtazapine 15 mg at bedtime.

## 2022-09-27 NOTE — Assessment & Plan Note (Signed)
Following with cardiology, reviewed echocardiogram from July 2023.  Resume Entresto 97-103 mg twice daily once out of donut hole. Continue valsartan 320 mg daily, metoprolol succinate 50 mg daily.  Reviewed cardiology and pulmonology notes.

## 2022-09-27 NOTE — Assessment & Plan Note (Signed)
Discussed the importance of a healthy diet and regular exercise in order for weight loss, and to reduce the risk of further co-morbidity. ? ?Repeat A1C pending. ?

## 2022-09-27 NOTE — Assessment & Plan Note (Signed)
She is taking levothyroxine correctly.  Continue levothyroxine 75 mcg daily. Reviewed TSH from July 2023.

## 2022-09-28 LAB — CBC
HCT: 39.4 % (ref 36.0–46.0)
Hemoglobin: 12.7 g/dL (ref 12.0–15.0)
MCHC: 32.4 g/dL (ref 30.0–36.0)
MCV: 77.1 fl — ABNORMAL LOW (ref 78.0–100.0)
Platelets: 317 10*3/uL (ref 150.0–400.0)
RBC: 5.11 Mil/uL (ref 3.87–5.11)
RDW: 17.4 % — ABNORMAL HIGH (ref 11.5–15.5)
WBC: 7.7 10*3/uL (ref 4.0–10.5)

## 2022-09-28 LAB — IBC + FERRITIN
Ferritin: 10.5 ng/mL (ref 10.0–291.0)
Iron: 62 ug/dL (ref 42–145)
Saturation Ratios: 15.7 % — ABNORMAL LOW (ref 20.0–50.0)
TIBC: 394.8 ug/dL (ref 250.0–450.0)
Transferrin: 282 mg/dL (ref 212.0–360.0)

## 2022-09-28 LAB — HEMOGLOBIN A1C: Hgb A1c MFr Bld: 6.4 % (ref 4.6–6.5)

## 2022-10-06 ENCOUNTER — Other Ambulatory Visit: Payer: Self-pay | Admitting: Primary Care

## 2022-10-06 DIAGNOSIS — E039 Hypothyroidism, unspecified: Secondary | ICD-10-CM

## 2022-10-06 DIAGNOSIS — E785 Hyperlipidemia, unspecified: Secondary | ICD-10-CM

## 2022-10-07 ENCOUNTER — Encounter: Payer: Self-pay | Admitting: Cardiovascular Disease

## 2022-10-07 NOTE — Progress Notes (Unsigned)
Cardiology Office Note:    Date:  10/08/2022   ID:  Denise [name], DOB [birth year], MRN [id number]  PCP:  Pleas Koch, NP   [place] [facility] Cardiologist:  Mertie Moores, MD     Referring MD: Pleas Koch, NP   Chief Complaint  Patient presents with   Congestive Heart Failure       Problem list   LBBB Dyspnea TIA   History of Present Illness:    Denise [name] is a 71 y.o. female with a hx of obesity, dyspnea, TIA, LBBB,   We were asked to see her by [name] for further evaluation of her shortness of breath   Echocardiogram from July 09, 2022 reveals normal LVEF of 55 to 60%. She has no significant valvular disease.  There is trivial  tricuspid regurgitation.  Has had LBBB 3-4 years ago Runs out of breath while walking and while dressing  Unable to take a deep breath  ( Has pulmonary appt in Aug)   Leg edema a year ago.  Edema is gradually worsening  She took lasix for 5 days ( does not remember the dose )  Does not think it helped.   Has 2 pillow orthopnea  Worsening over the past several months   25 lb weight gain over the past year Has not been able to walk much   Does not add much salt.  Is recently trying to avoid processed food   + fam hx of CAD Father had MI in mid 15's  Grandmother had stroke    Review of the labs from her primary medical doctor from June 27 shows a white blood cell count of 13.7.  Chest x-ray revealed diffuse infiltrates. CT scan showed ground glass appearance   Aug. 7, 2023 [name] is seen for a 1 week follow up of her CHF We changed her lasix to torsemide Started Entresto 49-51 BID, Jardiance 10 mg a day   Echo 07/09/22:    Normal LV systolic function - EF 70-01% No significant valvular disease Coronary CTA has been ordered but not scheduled yet  Feels much better  Is urinating more . Is feeling washed out ( likely from the torsemide therapy)   Wt is 226 lbs ( down 6 lbs  from last week )  Diet is better. Less salt  Potassium level was 4.0 on August 3.  This was 3 to 4 days after starting her torsemide and potassium therapy. Echocardiogram from July 24 shows normal left ventricular systolic function.  We were not able to assess her diastolic function. No significant mitral valve disease or aortic valve disease. Very minimal dilatation of the ascending aorta. RV function was normal  Does not have orthopnea any longer, still using 2 pillows   July 31, 2022: Denise [name] seen today for follow-up visit.  She has acute on chronic   diastolic congestive heart failure. Her dyspnea continues to improve .   Has been found to be anemic  Started iron  Is still fatigued  No longer has orthopnea  She needs EGD and colonoscopy  She is at low risk for her upcoming GI procedures    Sept. 19, 2023 Denise [name] is seen for follow up of her chronic diastilic CHF Has had anemia  She is been having some episodes of chest pain.  Coronary CT angiogram performed on August 27, 2022 reveals a coronary calcium score of 0.  She has normal coronary arteries. She was started on Isosorbide but  she stopped due to the severe headache   The GI appointments were not kept because of that her episode of chest pain.  We now have ruled out severe coronary artery disease.  She is cleared to have her colonoscopy and upper GI endoscopy for further evaluation of her GI bleeding.   Oct. 23, 2023 Denise [name] is seen for follow up of her diastolic CHF. Coronary CT angiogram performed on August 27, 2022 reveals a coronary calcium score of 0.  She has normal coronary arteries.  We started the entresto several months ago  Had to switch to valsartan due to the cost We also had her on spironolactone -   We discussed the importance of weight loss   Wt today is 230 lbs    Past Medical History:  Diagnosis Date   Allergy    seasonal   Arthritis    Bursitis of hip    Left   Constipation    90%of  the time c/o constipation, occ has diarrhea - uses stool softener    Endometriosis    Facial flushing 05/12/2014   Fibroid    GERD (gastroesophageal reflux disease)    Hematochezia    Hiatal hernia    HSV-1 (herpes simplex virus 1) infection    Hyperlipidemia    Hyperplastic colon polyp    Hypertension    Hypothyroidism    Ischemic colitis ([place]) 01/2019   LBBB (left bundle branch block)    Low back pain    Migraine    Osteopenia 2008   -1.2 FEMORAL NECK   Overdose of benzodiazepine 08/11/2021   Prediabetes    Stroke [facility])    TIA   TIA (transient ischemic attack) 08-10-2013    Past Surgical History:  Procedure Laterality Date   ABDOMINAL HYSTERECTOMY  1989   TAH, BSO   CHOLECYSTECTOMY  2003   COLONOSCOPY     KNEE SURGERY  2004   right   NASAL SEPTUM SURGERY  2012   PITUITARY SURGERY  2001   POLYPECTOMY     SHOULDER SURGERY  2007   right   THYROID LOBECTOMY  2002    Current Medications: Current Meds  Medication Sig   aspirin 81 MG tablet Take 81 mg by mouth daily.   atorvastatin (LIPITOR) 10 MG tablet TAKE 1 TABLET BY MOUTH DAILY FOR CHOLESTEROL   buPROPion ER (WELLBUTRIN SR) 100 MG 12 hr tablet TAKE 1 TABLET (100 MG TOTAL) BY MOUTH 2 (TWO) TIMES DAILY. FOR DEPRESSION   Cholecalciferol (D3) 10 MCG (400 UNIT) CHEW Chew by mouth.   DULoxetine (CYMBALTA) 30 MG capsule TAKE 1 CAPSULE BY MOUTH DAILY  FOR ANXIETY AND DEPRESSION TAKE  WITH 60 MG CAPSULE   DULoxetine (CYMBALTA) 60 MG capsule TAKE 1 CAPSULE BY MOUTH  DAILY FOR HEADACHE  PREVENTION   empagliflozin (JARDIANCE) 10 MG TABS tablet Take 1 tablet (10 mg total) by mouth daily before breakfast.   ferrous sulfate 325 (65 FE) MG EC tablet Take 1 tablet (325 mg total) by mouth daily with breakfast.   lansoprazole (EQ LANSOPRAZOLE) 15 MG capsule Take 15 mg by mouth 2 (two) times daily.   levothyroxine (SYNTHROID) 75 MCG tablet TAKE 1 TABLET BY MOUTH IN THE  MORNING ON AN EMPTY STOMACH WITH WATER ONLY. NO FOOD OR OTHER   MEDICATIONS FOR 1/2 HOUR   magnesium 30 MG tablet Take 30 mg by mouth daily.   metoprolol succinate (TOPROL-XL) 50 MG 24 hr tablet Take 1 tablet (50 mg total) by mouth  daily. Take with or immediately following a meal.   mirtazapine (REMERON) 15 MG tablet Take 1 tablet (15 mg total) by mouth at bedtime. For sleep   nitroGLYCERIN (NITROSTAT) 0.4 MG SL tablet Dissolve 1 tablet under the tongue every 5 minutes as needed for chest pain. Max of 3 doses, then 911.   valsartan (DIOVAN) 320 MG tablet Take 1 tablet (320 mg total) by mouth daily.     Allergies:   Tramadol   Social History   Socioeconomic History   Marital status: Divorced    Spouse name: Not on file   Number of children: 2   Years of education: Not on file   Highest education level: Not on file  Occupational History    Employer: RETIRED  Tobacco Use   Smoking status: Never   Smokeless tobacco: Never  Vaping Use   Vaping Use: Never used  Substance and Sexual Activity   Alcohol use: Yes    Alcohol/week: 0.0 standard drinks of alcohol    Comment: 1 every 3 months   Drug use: No   Sexual activity: Not Currently    Comment: intercourse age 22, more than 5 sexual partners,des neg  Other Topics Concern   Not on file  Social History Narrative   Single.   2 daughters, 4 grandchildren.   Once worked for [employer].   Enjoys Ambulance person.    Social Determinants of Health   Financial Resource Strain: Low Risk  (12/28/2021)   Overall Financial Resource Strain (CARDIA)    Difficulty of Paying Living Expenses: Not hard at all  Food Insecurity: No Food Insecurity (01/26/2019)   Hunger Vital Sign    Worried About Running Out of Food in the Last Year: Never true    Ran Out of Food in the Last Year: Never true  Transportation Needs: No Transportation Needs (01/26/2019)   PRAPARE - Hydrologist (Medical): No    Lack of Transportation (Non-Medical): No  Physical Activity:  Unknown (01/26/2019)   Exercise Vital Sign    Days of Exercise per Week: Patient refused    Minutes of Exercise per Session: Patient refused  Stress: No Stress Concern Present (01/26/2019)   [place]    Feeling of Stress : Not at all  Social Connections: Not on file     Family History: The patient's family history includes Breast cancer in her maternal aunt; Cancer in her mother; Heart disease in her father; Parkinson's disease in her sister. There is no history of Colon cancer, Colon polyps, Rectal cancer, Stomach cancer, Thyroid disease, Esophageal cancer, or Pancreatic cancer.  ROS:   Please see the history of present illness.     All other systems reviewed and are negative.  EKGs/Labs/Other Studies Reviewed:    The following studies were reviewed today:   EKG:     Recent Labs: 06/12/2022: Pro B Natriuretic peptide (BNP) 38.0 07/16/2022: TSH 2.210 07/19/2022: Magnesium 2.2 07/27/2022: ALT 10 09/25/2022: BUN 18; Creatinine, Ser 1.02; Potassium 4.2; Sodium 142 09/27/2022: Hemoglobin 12.7; Platelets 317.0  Recent Lipid Panel    Component Value Date/Time   CHOL 110 07/12/2022 1236   TRIG 163.0 (H) 07/12/2022 1236   HDL 42.60 07/12/2022 1236   CHOLHDL 3 07/12/2022 1236   VLDL 32.6 07/12/2022 1236   LDLCALC 34 07/12/2022 1236   LDLDIRECT 72.0 06/23/2021 1245     Risk Assessment/Calculations:      Physical Exam:  Blood pressure 126/80, pulse 88, height '5\' 4"'$  (1.626 m), weight 230 lb 3.2 oz (104.4 kg), SpO2 97 %.       GEN:  middle age, moderately obese female  in no acute distress HEENT: Normal NECK: No JVD; No carotid bruits LYMPHATICS: No lymphadenopathy CARDIAC: RRR   RESPIRATORY:  Clear to auscultation without rales, wheezing or rhonchi  ABDOMEN: Soft, non-tender, non-distended MUSCULOSKELETAL:  No edema; No deformity  SKIN: Warm and dry NEUROLOGIC:  Alert and oriented x 3   ECG :         ASSESSMENT:    1. LBBB (left bundle branch block)   2. Essential hypertension   3. Chronic diastolic CHF (congestive heart failure) ([place])   4. Chronic diastolic heart failure (HCC)       PLAN:      Acute on chronic diastolic congestive heart failure : Jackelyn Poling seems to be doing fairly well.  Cost of her [name] went up this month.  She was switched to valsartan.  She does admit that she was feeling better on the [facility] but cannot afford it right now.  She will switch back to [place] in January.  We will continue current dose of valsartan for now.  Somehow she is also off the spironolactone.  Perhaps we held that while we are uptitrating her Entresto.  We will going to restart the spironolactone today.  We will check a basic metabolic profile in 3 weeks.  .   2.  Chest pain: No further episodes of chest pain.  Her coronary CT angiogram revealed a coronary calcium score of 0 and no significant coronary artery disease.   3.  Tachycardia:      4..  Iron deficient anemia.     She will continue to work through her primary medical doctor for treatment of her anemia.       Medication Adjustments/Labs and Tests Ordered: Current medicines are reviewed at length with the patient today.  Concerns regarding medicines are outlined above.  Orders Placed This Encounter  Procedures   Basic metabolic panel   Meds ordered this encounter  Medications   spironolactone (ALDACTONE) 25 MG tablet    Sig: Take 1 tablet (25 mg total) by mouth daily.    Dispense:  90 tablet    Refill:  3    Patient Instructions  Medication Instructions:  RESTART Spironolactone '25mg'$  daily STOP Entresto *If you need a refill on your cardiac medications before your next appointment, please call your pharmacy*   Lab Work: BMET in 3 weeks If you have labs (blood work) drawn today and your tests are completely normal, you will receive your results only by: [place] (if you have MyChart) OR A paper  copy in the mail If you have any lab test that is abnormal or we need to change your treatment, we will call you to review the results.   Testing/Procedures: NONE   Follow-Up: At [facility], you and your health needs are our priority.  As part of our continuing mission to provide you with exceptional heart care, we have created designated Provider Care Teams.  These Care Teams include your primary Cardiologist (physician) and Advanced Practice Providers (APPs -  Physician Assistants and Nurse Practitioners) who all work together to provide you with the care you need, when you need it.  We recommend signing up for the patient portal called "MyChart".  Sign up information is provided on this After Visit Summary.  MyChart is used to connect  with patients for Virtual Visits (Telemedicine).  Patients are able to view lab/test results, encounter notes, upcoming appointments, etc.  Non-urgent messages can be sent to your provider as well.   To learn more about what you can do with MyChart, go to [email or web address].    Your next appointment:   3 month(s)  The format for your next appointment:   In Person  Provider:   Mertie Moores, MD       Important Information About Sugar         Signed, Mertie Moores, MD  10/08/2022 2:24 PM    [place]

## 2022-10-08 ENCOUNTER — Ambulatory Visit: Payer: Medicare Other | Attending: Cardiovascular Disease | Admitting: Cardiovascular Disease

## 2022-10-08 ENCOUNTER — Encounter: Payer: Self-pay | Admitting: Cardiovascular Disease

## 2022-10-08 VITALS — BP 126/80 | HR 88 | Ht 64.0 in | Wt 230.2 lb

## 2022-10-08 DIAGNOSIS — I5032 Chronic diastolic (congestive) heart failure: Secondary | ICD-10-CM | POA: Diagnosis not present

## 2022-10-08 DIAGNOSIS — I10 Essential (primary) hypertension: Secondary | ICD-10-CM | POA: Diagnosis not present

## 2022-10-08 DIAGNOSIS — I447 Left bundle-branch block, unspecified: Secondary | ICD-10-CM

## 2022-10-08 MED ORDER — SPIRONOLACTONE 25 MG PO TABS: 25.0000 mg | ORAL_TABLET | Freq: Every day | ORAL | 3 refills | Status: DC

## 2022-10-08 NOTE — Patient Instructions (Signed)
Medication Instructions:  RESTART Spironolactone '25mg'$  daily STOP Entresto *If you need a refill on your cardiac medications before your next appointment, please call your pharmacy*   Lab Work: BMET in 3 weeks If you have labs (blood work) drawn today and your tests are completely normal, you will receive your results only by: [place] (if you have MyChart) OR A paper copy in the mail If you have any lab test that is abnormal or we need to change your treatment, we will call you to review the results.   Testing/Procedures: NONE   Follow-Up: At [facility], you and your health needs are our priority.  As part of our continuing mission to provide you with exceptional heart care, we have created designated Provider Care Teams.  These Care Teams include your primary Cardiologist (physician) and Advanced Practice Providers (APPs -  Physician Assistants and Nurse Practitioners) who all work together to provide you with the care you need, when you need it.  We recommend signing up for the patient portal called "MyChart".  Sign up information is provided on this After Visit Summary.  MyChart is used to connect with patients for Virtual Visits (Telemedicine).  Patients are able to view lab/test results, encounter notes, upcoming appointments, etc.  Non-urgent messages can be sent to your provider as well.   To learn more about what you can do with MyChart, go to [email or web address].    Your next appointment:   3 month(s)  The format for your next appointment:   In Person  Provider:   Mertie Moores, MD       Important Information About Sugar

## 2022-10-10 ENCOUNTER — Telehealth: Payer: Self-pay

## 2022-10-10 ENCOUNTER — Ambulatory Visit: Payer: Medicare Other

## 2022-10-10 ENCOUNTER — Telehealth: Payer: Self-pay | Admitting: Nurse Practitioner

## 2022-10-10 NOTE — Telephone Encounter (Signed)
Unsuccessful attempt to reach patient on preferred number listed in notes for scheduled AWV. Left message on voicemail okay to reschedule. 

## 2022-10-10 NOTE — Telephone Encounter (Signed)
[place], pls contact patient to schedule EGD and colonoscopy with Dr. Havery Moros. Updated cardiology clearance  by Dr. Acie Fredrickson received  09/04/2022 as follows:  "Denise [name] is seen for follow up of her chronic diastilic CHF Has had anemia  She is been having some episodes of chest pain.  Coronary CT angiogram performed on August 27, 2022 reveals a coronary calcium score of 0.  She has normal coronary arteries. She was started on Isosorbide but she stopped due to the severe headache    The GI appointments were not kept because of that her episode of chest pain.  We now have ruled out severe coronary artery disease.  She is cleared to have her colonoscopy and upper GI endoscopy for further evaluation of her GI bleeding".

## 2022-10-10 NOTE — Telephone Encounter (Signed)
Colleen, I have reached out to patient several times as documented in 09/27/22 result note from PCP. I also sent patient a MyChart message regarding getting rescheduled. Pt reviewed the message but has not responded.

## 2022-10-17 DIAGNOSIS — M545 Low back pain, unspecified: Secondary | ICD-10-CM | POA: Diagnosis not present

## 2022-10-22 DIAGNOSIS — H2512 Age-related nuclear cataract, left eye: Secondary | ICD-10-CM | POA: Diagnosis not present

## 2022-10-23 DIAGNOSIS — H25041 Posterior subcapsular polar age-related cataract, right eye: Secondary | ICD-10-CM | POA: Diagnosis not present

## 2022-10-23 DIAGNOSIS — H2511 Age-related nuclear cataract, right eye: Secondary | ICD-10-CM | POA: Diagnosis not present

## 2022-10-23 DIAGNOSIS — H25011 Cortical age-related cataract, right eye: Secondary | ICD-10-CM | POA: Diagnosis not present

## 2022-10-26 ENCOUNTER — Ambulatory Visit: Payer: Medicare Other

## 2022-10-26 ENCOUNTER — Other Ambulatory Visit: Payer: Self-pay | Admitting: Nurse Practitioner

## 2022-10-26 DIAGNOSIS — D509 Iron deficiency anemia, unspecified: Secondary | ICD-10-CM

## 2022-10-26 NOTE — Telephone Encounter (Signed)
Please advise 

## 2022-10-26 NOTE — Telephone Encounter (Signed)
Please advise patient's refill request.

## 2022-10-30 ENCOUNTER — Encounter: Payer: Self-pay | Admitting: Cardiovascular Disease

## 2022-10-30 MED ORDER — TORSEMIDE 20 MG PO TABS: ORAL_TABLET | ORAL | 3 refills | Status: DC

## 2022-10-30 MED ORDER — POTASSIUM CHLORIDE ER 10 MEQ PO TBCR: EXTENDED_RELEASE_TABLET | ORAL | 3 refills | Status: DC

## 2022-10-30 NOTE — Telephone Encounter (Signed)
Called and spoke with patient to review below information. She expressed that she does not know if she will be able to resume Entresto in January as she has since reviewed her plan that she signed up for more closely, and doesn't think she will be able to afford it for more than 4 months maximum. I gave her the number to [employer] patient assistance and asked her to reach out to them and try to get approved for the PA and she can get it for free. She states she will look into this and call to try and get approved for 2024 calendar year. In the meantime, she did verbal read-back of below instructions and asks that I send the torsemide and KCL to pharmacy, but to place on file as she has enough on hand from old fills to do the 5 day dosing and then will pick up if she is going to stay on it. Meds sent to pharmacy on file at this time.  [name], [name], MD  [name] K I suspect she is volume overloaded She was doing much better on the Entresto ( instead of Valsartan alone) We have tried Lasix and torsemide in the past - it did not seem like it helped her much  We can try torsemide again 20 mg BID for 3-5 days and then see how she feels Take Kdur 20 meq BID while on torsemide ( 10 meq tabs - 2 tabs BID) If she feels better on the torsemide and Kdur, she may continue Torsemide 20 QD and Kdur 20 meq QD after the initial 3-5 days   It sounds like she cannot afford to get the entresto until Jan. 2024. Have her work on weight loss  She does not have any CAD ( by recent coronary CTA ) Echo in July, 2023 shows normal LV systolic function with indeterminate diastolic function  Is she more anemic.  Her Hb has continued to fall as of Oct. 12. Does she had OSA ? Is she eating any salty foods.

## 2022-11-02 DIAGNOSIS — D509 Iron deficiency anemia, unspecified: Secondary | ICD-10-CM

## 2022-11-02 MED ORDER — FERROUS SULFATE 325 (65 FE) MG PO TBEC: 325.0000 mg | DELAYED_RELEASE_TABLET | Freq: Every day | ORAL | 0 refills | Status: DC

## 2022-11-06 ENCOUNTER — Ambulatory Visit: Payer: Medicare Other

## 2022-11-06 ENCOUNTER — Other Ambulatory Visit: Payer: Medicare Other

## 2022-11-06 ENCOUNTER — Telehealth: Payer: Self-pay

## 2022-11-06 NOTE — Telephone Encounter (Signed)
Unable to reach patient when called for scheduled AWV. No answer. Left message. Okay to reschedule.

## 2022-11-15 ENCOUNTER — Other Ambulatory Visit: Payer: Medicare Other

## 2022-11-21 ENCOUNTER — Encounter: Payer: Self-pay | Admitting: Cardiovascular Disease

## 2022-11-23 ENCOUNTER — Encounter: Payer: Self-pay | Admitting: Nurse Practitioner

## 2022-11-23 ENCOUNTER — Ambulatory Visit: Payer: Medicare Other | Attending: Nurse Practitioner | Admitting: Nurse Practitioner

## 2022-11-23 VITALS — BP 118/68 | HR 99 | Ht 64.0 in | Wt 235.6 lb

## 2022-11-23 DIAGNOSIS — R6889 Other general symptoms and signs: Secondary | ICD-10-CM

## 2022-11-23 DIAGNOSIS — R0602 Shortness of breath: Secondary | ICD-10-CM | POA: Diagnosis not present

## 2022-11-23 DIAGNOSIS — I5032 Chronic diastolic (congestive) heart failure: Secondary | ICD-10-CM | POA: Diagnosis not present

## 2022-11-23 DIAGNOSIS — I5033 Acute on chronic diastolic (congestive) heart failure: Secondary | ICD-10-CM | POA: Diagnosis not present

## 2022-11-23 DIAGNOSIS — I447 Left bundle-branch block, unspecified: Secondary | ICD-10-CM

## 2022-11-23 DIAGNOSIS — I10 Essential (primary) hypertension: Secondary | ICD-10-CM

## 2022-11-23 DIAGNOSIS — H2511 Age-related nuclear cataract, right eye: Secondary | ICD-10-CM | POA: Diagnosis not present

## 2022-11-23 NOTE — Progress Notes (Signed)
Cardiology Office Note:    Date:  11/23/2022   ID:  Denise [name], DOB 04/28/1951, MRN [id number]  PCP:  Pleas Koch, NP   [facility] Providers Cardiologist:  Mertie Moores, MD     Referring MD: Pleas Koch, NP   Chief Complaint: shortness of breath, heart racing  History of Present Illness:    [name] is a very pleasant 71 y.o. female with a hx of LBBB, family history CAD (father MI mid 33s, grandmother stroke), chronic HFpEF, coronary CTA 08/2022 coronary calcium score 0, normal coronary arteries, obesity, iron deficiency anemia, and HTN  Referred to cardiology and seen by Dr. Acie Fredrickson on 07/16/2022 for shortness of breath.  Echocardiogram 07/09/2022 reveals normal LVEF 55 to 60%, no significant valve disease, trivial TR, indeterminate diastolic parameters.  Ports she runs out of breath while walking and while getting dressed.  Feels like she is unable to take a deep breath.  Leg edema gradually worsening, took Lasix for 5 days but does not think it helped.  Has 2 pillow orthopnea worsening over the past few months.  25 lb weight gain over the previous year, no regular exercise.  Does not add much salt and recently had been trying to avoid processed food. CT showed groundglass appearance.  She was felt to have acute on chronic diastolic congestive heart failure and was started on torsemide 20 mg daily, K-Dur 10 milliequivalents daily, and Entresto 49-51 twice daily. Amlodipine was discontinued and Jardiance 10 mg daily was started.  At follow-up visit 1 week later, weight down 6 lbs, she was feeling better but also feeling washed out. No longer having orthopnea.  She was advised to start spironolactone 25 mg daily and to decrease torsemide and potassium to 3 days weekly.  Office visit 09/04/22, was planned for GI procedures but they were canceled due to chest pain.  Coronary calcium score of 0 on CCTA 08/27/2022.  She was started on isosorbide but stopped due to  severe headache.   Cardiology clinic visit was 10/08/2022 with Dr. Acie Fredrickson. Had to stop Entresto due to cost, started on valsartan. Felt better on Entresto and is willing to restart if price is affordable. Her spironolactone which had been inadvertently stopped was restarted and she was scheduled for follow-up bmet in 2 weeks, however there is no record of this being completed. Advised to return in 3 months for follow-up.  Today, she is here for follow-up. Reports she is feeling worse since last office visit. Feels like she did when she initially came in to see Dr. Acie Fredrickson July 2023. Has noted increased heart racing and shortness of breath. Not able to walk as far without SOB over the past 2 weeks. Sleeping on 3 pillows currently. Return of PND which had initially improved on [place].  No edema. Additionally, has a two year problem with severe diaphoresis with any exertion, and sometimes at rest. Does not feel flushed when this occurs but it is accompanied by chest pain and SOB. Symptoms do not occur at a certain time of day and started prior to starting cardiac medications.  Also has nasal congestion and cough, the symptoms preceded starting ARNI/ARB. Took torsemide 5 days in a row with some but no marked improvement. Now has cramping in back, hands, and legs. No presyncope, syncope. Felt better while taking Entresto but is not able to afford it. Is hoping insurance will cover it better in January.   Past Medical History:  Diagnosis Date  Allergy    seasonal   Arthritis    Bursitis of hip    Left   Constipation    90%of the time c/o constipation, occ has diarrhea - uses stool softener    Endometriosis    Facial flushing 05/12/2014   Fibroid    GERD (gastroesophageal reflux disease)    Hematochezia    Hiatal hernia    HSV-1 (herpes simplex virus 1) infection    Hyperlipidemia    Hyperplastic colon polyp    Hypertension    Hypothyroidism    Ischemic colitis ([place]) 01/2019    LBBB (left bundle branch block)    Low back pain    Migraine    Osteopenia 2008   -1.2 FEMORAL NECK   Overdose of benzodiazepine 08/11/2021   Prediabetes    Stroke [facility])    TIA   TIA (transient ischemic attack) 08-10-2013    Past Surgical History:  Procedure Laterality Date   ABDOMINAL HYSTERECTOMY  1989   TAH, BSO   CHOLECYSTECTOMY  2003   COLONOSCOPY     KNEE SURGERY  2004   right   NASAL SEPTUM SURGERY  2012   PITUITARY SURGERY  2001   POLYPECTOMY     SHOULDER SURGERY  2007   right   THYROID LOBECTOMY  2002    Current Medications: Current Meds  Medication Sig   aspirin 81 MG tablet Take 81 mg by mouth daily.   atorvastatin (LIPITOR) 10 MG tablet TAKE 1 TABLET BY MOUTH DAILY FOR CHOLESTEROL   buPROPion ER (WELLBUTRIN SR) 100 MG 12 hr tablet TAKE 1 TABLET (100 MG TOTAL) BY MOUTH 2 (TWO) TIMES DAILY. FOR DEPRESSION   Cholecalciferol (D3) 10 MCG (400 UNIT) CHEW Chew by mouth.   DULoxetine (CYMBALTA) 30 MG capsule TAKE 1 CAPSULE BY MOUTH DAILY  FOR ANXIETY AND DEPRESSION TAKE  WITH 60 MG CAPSULE   DULoxetine (CYMBALTA) 60 MG capsule TAKE 1 CAPSULE BY MOUTH  DAILY FOR HEADACHE  PREVENTION   empagliflozin (JARDIANCE) 10 MG TABS tablet Take 1 tablet (10 mg total) by mouth daily before breakfast.   ferrous sulfate 325 (65 FE) MG EC tablet Take 1 tablet (325 mg total) by mouth daily with breakfast.   lansoprazole (EQ LANSOPRAZOLE) 15 MG capsule Take 15 mg by mouth 2 (two) times daily.   levothyroxine (SYNTHROID) 75 MCG tablet TAKE 1 TABLET BY MOUTH IN THE  MORNING ON AN EMPTY STOMACH WITH WATER ONLY. NO FOOD OR OTHER  MEDICATIONS FOR 1/2 HOUR   magnesium 30 MG tablet Take 30 mg by mouth daily.   metoprolol succinate (TOPROL-XL) 50 MG 24 hr tablet Take 1 tablet (50 mg total) by mouth daily. Take with or immediately following a meal.   mirtazapine (REMERON) 15 MG tablet Take 1 tablet (15 mg total) by mouth at bedtime. For sleep   nitroGLYCERIN (NITROSTAT) 0.4 MG SL tablet Dissolve 1  tablet under the tongue every 5 minutes as needed for chest pain. Max of 3 doses, then 911.   potassium chloride (KLOR-CON) 10 MEQ tablet Take 2 tablets by mouth twice daily for 3-5 days, then decrease to 2 tablets once daily thereafter   sacubitril-valsartan (ENTRESTO) 49-51 MG Take 1 tablet by mouth 2 (two) times daily.   spironolactone (ALDACTONE) 25 MG tablet Take 1 tablet (25 mg total) by mouth daily.   torsemide (DEMADEX) 20 MG tablet Take 1 tablet by mouth twice daily for 3-5 days, then decrease to 1 tablet daily thereafter   [DISCONTINUED] valsartan (  DIOVAN) 320 MG tablet Take 1 tablet (320 mg total) by mouth daily.     Allergies:   Tramadol   Social History   Socioeconomic History   Marital status: Divorced    Spouse name: Not on file   Number of children: 2   Years of education: Not on file   Highest education level: Not on file  Occupational History    Employer: RETIRED  Tobacco Use   Smoking status: Never   Smokeless tobacco: Never  Vaping Use   Vaping Use: Never used  Substance and Sexual Activity   Alcohol use: Yes    Alcohol/week: 0.0 standard drinks of alcohol    Comment: 1 every 3 months   Drug use: No   Sexual activity: Not Currently    Comment: intercourse age 63, more than 5 sexual partners,des neg  Other Topics Concern   Not on file  Social History Narrative   Single.   2 daughters, 4 grandchildren.   Once worked for [employer].   Enjoys Ambulance person.    Social Determinants of Health   Financial Resource Strain: Low Risk  (12/28/2021)   Overall Financial Resource Strain (CARDIA)    Difficulty of Paying Living Expenses: Not hard at all  Food Insecurity: No Food Insecurity (01/26/2019)   Hunger Vital Sign    Worried About Running Out of Food in the Last Year: Never true    Ran Out of Food in the Last Year: Never true  Transportation Needs: No Transportation Needs (01/26/2019)   PRAPARE - Radiographer, therapeutic (Medical): No    Lack of Transportation (Non-Medical): No  Physical Activity: Unknown (01/26/2019)   Exercise Vital Sign    Days of Exercise per Week: Patient refused    Minutes of Exercise per Session: Patient refused  Stress: No Stress Concern Present (01/26/2019)   [place]    Feeling of Stress : Not at all  Social Connections: Not on file     Family History: The patient's family history includes Breast cancer in her maternal aunt; Cancer in her mother; Heart disease in her father; Parkinson's disease in her sister. There is no history of Colon cancer, Colon polyps, Rectal cancer, Stomach cancer, Thyroid disease, Esophageal cancer, or Pancreatic cancer.  ROS:   Please see the history of present illness.    + DOE + PND, orthopnea + tachycardia All other systems reviewed and are negative.  Labs/Other Studies Reviewed:    The following studies were reviewed today:  Echo 07/09/22 1. Left ventricular ejection fraction, by estimation, is 55 to 60%. The  left ventricle has normal function. The left ventricle has no regional  wall motion abnormalities. Left ventricular diastolic parameters are  indeterminate.   2. Right ventricular systolic function is normal. The right ventricular  size is normal.   3. The mitral valve is normal in structure. No evidence of mitral valve  regurgitation. No evidence of mitral stenosis.   4. The aortic valve is normal in structure. Aortic valve regurgitation is  not visualized. No aortic stenosis is present.   5. There is mild dilatation of the ascending aorta, measuring 37 mm.   6. The inferior vena cava is normal in size with greater than 50%  respiratory variability, suggesting right atrial pressure of 3 mmHg.   CCTA 09/06/22 1. No evidence of CAD, 0% stenosis, CADRADS 0.   2. Coronary calcium score of 0.  3. Normal coronary origins with right dominance.    RECOMMENDATIONS: CAD-RADS 0. No evidence of CAD (0%). Consider non-atherosclerotic causes of chest pain.  Recent Labs: 06/12/2022: Pro B Natriuretic peptide (BNP) 38.0 07/16/2022: TSH 2.210 07/19/2022: Magnesium 2.2 07/27/2022: ALT 10 09/25/2022: BUN 18; Creatinine, Ser 1.02; Potassium 4.2; Sodium 142 09/27/2022: Hemoglobin 12.7; Platelets 317.0  Recent Lipid Panel    Component Value Date/Time   CHOL 110 07/12/2022 1236   TRIG 163.0 (H) 07/12/2022 1236   HDL 42.60 07/12/2022 1236   CHOLHDL 3 07/12/2022 1236   VLDL 32.6 07/12/2022 1236   LDLCALC 34 07/12/2022 1236   LDLDIRECT 72.0 06/23/2021 1245     Risk Assessment/Calculations:       Physical Exam:    VS:  BP 118/68   Pulse 99   Ht '5\' 4"'$  (1.626 m)   Wt 235 lb 9.6 oz (106.9 kg)   SpO2 97%   BMI 40.44 kg/m     Wt Readings from Last 3 Encounters:  11/23/22 235 lb 9.6 oz (106.9 kg)  10/08/22 230 lb 3.2 oz (104.4 kg)  09/27/22 228 lb (103.4 kg)     GEN:  Well nourished, well developed in no acute distress HEENT: Normal NECK: No JVD; No carotid bruits CARDIAC: RRR, no murmurs, rubs, gallops RESPIRATORY:  Clear to auscultation without rales, wheezing or rhonchi  ABDOMEN: Soft, non-tender, non-distended MUSCULOSKELETAL:  No edema; No deformity. 2+ pedal pulses, equal bilaterally SKIN: Warm and dry NEUROLOGIC:  Alert and oriented x 3 PSYCHIATRIC:  Normal affect   EKG:  EKG is ordered today.  The ekg ordered today demonstrates sinus rhythm at 99 bpm, LBBB  Diagnoses:    1. Acute on chronic diastolic congestive heart failure, NYHA class 2 ([place])   2. Essential hypertension   3. Shortness of breath   4. LBBB (left bundle branch block)   5. Heat intolerance    Assessment and Plan:     Acute on chronic HFpEF: Referred for DOE July 2023, found to be volume overloaded and treated for HFpEF. 2-week history of worsening symptoms including dyspnea on exertion, PND, and orthopnea. No edema, no chest pain associated with  DOE. Felt better when she was on Entresto but it is cost-prohibitive.  Will have her continue torsemide 20 mg daily. Will discontinue valsartan and restart Entresto 49-51 mg twice daily. Was able to provide her samples for one month and will retry insurance in January. Patient assistance paperwork given. Continue Jardiance 10 mg (samples given) metoprolol, and aldactone. Will get BMET and BNP to aid with further up-titration of GDMT. Will see her back in 2 weeks, sooner if needed.    LBBB: Known. LVEF normal on echo 06/2022. Will continue to monitor.   Heat intolerance/Tachycardia: Episodes of profuse sweating accompanied by chest discomfort. Events started prior to cardiac medications. HR somewhat elevated today at 99 bpm, which had previously normalized on GDMT. No recent testing of thyroid. Will check TSH to ensure no thyroid dysfunction that may be contributing. PCP has recommended that she see dermatology.  Hypertension: BP is well-controlled. Denies any recent abnormal BP readings.      Disposition: 2 weeks with me  Medication Adjustments/Labs and Tests Ordered: Current medicines are reviewed at length with the patient today.  Concerns regarding medicines are outlined above.  Orders Placed This Encounter  Procedures   TSH   Pro b natriuretic peptide   Basic Metabolic Panel (BMET)   EKG 12-Lead   No orders of the defined types were  placed in this encounter.   Patient Instructions  Medication Instructions:   RESTART Entresto one (1) tablet by mouth (49-51 mg ) twice daily. Pt given samples today.  DISCONTINUE VALSARTAN  TAKE Torsemide one (1) tablet by mouth ( 20 mg) daily with your Potassium.  TAKE Kdur two (2) tablets by mouth ( 20 mEq) daily with your Torsemide.   *If you need a refill on your cardiac medications before your next appointment, please call your pharmacy*   Lab Work:  TODAY !!!!! PRO BNP/TSH/BMET  If you have labs (blood work) drawn today and your tests  are completely normal, you will receive your results only by: [place] (if you have MyChart) OR A paper copy in the mail If you have any lab test that is abnormal or we need to change your treatment, we will call you to review the results.   Testing/Procedures:  None ordered.   Follow-Up: At [facility], you and your health needs are our priority.  As part of our continuing mission to provide you with exceptional heart care, we have created designated Provider Care Teams.  These Care Teams include your primary Cardiologist (physician) and Advanced Practice Providers (APPs -  Physician Assistants and Nurse Practitioners) who all work together to provide you with the care you need, when you need it.  We recommend signing up for the patient portal called "MyChart".  Sign up information is provided on this After Visit Summary.  MyChart is used to connect with patients for Virtual Visits (Telemedicine).  Patients are able to view lab/test results, encounter notes, upcoming appointments, etc.  Non-urgent messages can be sent to your provider as well.   To learn more about what you can do with MyChart, go to [email or web address].    Your next appointment:   2 week(s)  The format for your next appointment:   In Person  Provider:   Christen Bame, NP         Important Information About Sugar         Signed, Emmaline Life, NP  11/23/2022 12:52 PM    [place]

## 2022-11-23 NOTE — Patient Instructions (Signed)
Medication Instructions:   RESTART Entresto one (1) tablet by mouth (49-51 mg ) twice daily. Pt given samples today.  DISCONTINUE VALSARTAN  TAKE Torsemide one (1) tablet by mouth ( 20 mg) daily with your Potassium.  TAKE Kdur two (2) tablets by mouth ( 20 mEq) daily with your Torsemide.   *If you need a refill on your cardiac medications before your next appointment, please call your pharmacy*   Lab Work:  TODAY !!!!! PRO BNP/TSH/BMET  If you have labs (blood work) drawn today and your tests are completely normal, you will receive your results only by: [place] (if you have MyChart) OR A paper copy in the mail If you have any lab test that is abnormal or we need to change your treatment, we will call you to review the results.   Testing/Procedures:  None ordered.   Follow-Up: At [facility], you and your health needs are our priority.  As part of our continuing mission to provide you with exceptional heart care, we have created designated Provider Care Teams.  These Care Teams include your primary Cardiologist (physician) and Advanced Practice Providers (APPs -  Physician Assistants and Nurse Practitioners) who all work together to provide you with the care you need, when you need it.  We recommend signing up for the patient portal called "MyChart".  Sign up information is provided on this After Visit Summary.  MyChart is used to connect with patients for Virtual Visits (Telemedicine).  Patients are able to view lab/test results, encounter notes, upcoming appointments, etc.  Non-urgent messages can be sent to your provider as well.   To learn more about what you can do with MyChart, go to [email or web address].    Your next appointment:   2 week(s)  The format for your next appointment:   In Person  Provider:   Christen Bame, NP         Important Information About Sugar

## 2022-11-24 LAB — BASIC METABOLIC PANEL
BUN/Creatinine Ratio: 25 (ref 12–28)
BUN: 35 mg/dL — ABNORMAL HIGH (ref 8–27)
CO2: 21 mmol/L (ref 20–29)
Calcium: 9.5 mg/dL (ref 8.7–10.3)
Chloride: 96 mmol/L (ref 96–106)
Creatinine, Ser: 1.41 mg/dL — ABNORMAL HIGH (ref 0.57–1.00)
Glucose: 83 mg/dL (ref 70–99)
Potassium: 5 mmol/L (ref 3.5–5.2)
Sodium: 132 mmol/L — ABNORMAL LOW (ref 134–144)
eGFR: 40 mL/min/{1.73_m2} — ABNORMAL LOW (ref >59)

## 2022-11-24 LAB — PRO B NATRIURETIC PEPTIDE: NT-Pro BNP: 61 pg/mL (ref 0–301)

## 2022-11-24 LAB — TSH: TSH: 8.73 u[IU]/mL — ABNORMAL HIGH (ref 0.450–4.500)

## 2022-11-26 ENCOUNTER — Ambulatory Visit
Admission: RE | Admit: 2022-11-26 | Discharge: 2022-11-26 | Disposition: A | Discharge status: Home / Self Care | Payer: Medicare Other | Source: Ambulatory Visit | Attending: Primary Care | Admitting: Primary Care

## 2022-11-26 DIAGNOSIS — Z1231 Encounter for screening mammogram for malignant neoplasm of breast: Secondary | ICD-10-CM

## 2022-11-30 ENCOUNTER — Other Ambulatory Visit: Payer: Medicare Other

## 2022-12-04 NOTE — Progress Notes (Unsigned)
Cardiology Office Note:    Date:  12/06/2022   ID:  Denise [name], DOB [birth year], MRN [id number]  PCP:  Pleas Koch, NP   [facility] Providers Cardiologist:  Mertie Moores, MD     Referring MD: Pleas Koch, NP   Chief Complaint: shortness of breath, heart racing  History of Present Illness:    Denise [name] is a very pleasant 71 y.o. female with a hx of LBBB, family history CAD (father MI mid 60s, grandmother stroke), chronic HFpEF, coronary CTA 08/2022 coronary calcium score 0, normal coronary arteries, obesity, iron deficiency anemia, and HTN  Referred to cardiology and seen by Dr. Acie Fredrickson on 07/16/2022 for shortness of breath.  Echocardiogram 07/09/2022 reveals normal LVEF 55 to 60%, no significant valve disease, trivial TR, indeterminate diastolic parameters.  Ports she runs out of breath while walking and while getting dressed.  Feels like she is unable to take a deep breath.  Leg edema gradually worsening, took Lasix for 5 days but does not think it helped.  Has 2 pillow orthopnea worsening over the past few months.  25 lb weight gain over the previous year, no regular exercise.  Does not add much salt and recently had been trying to avoid processed food. CT showed groundglass appearance.  She was felt to have acute on chronic diastolic congestive heart failure and was started on torsemide 20 mg daily, K-Dur 10 milliequivalents daily, and Entresto 49-51 twice daily. Amlodipine was discontinued and Jardiance 10 mg daily was started.  At follow-up visit 1 week later, weight down 6 lbs, she was feeling better but also feeling washed out. No longer having orthopnea.  She was advised to start spironolactone 25 mg daily and to decrease torsemide and potassium to 3 days weekly.  Office visit 09/04/22, was planned for GI procedures but they were canceled due to chest pain. Coronary calcium score of 0 on CCTA 08/27/2022.  She was started on isosorbide but stopped due to  severe headache.   Cardiology clinic visit was 10/08/2022 with Dr. Acie Fredrickson. Had to stop Entresto due to cost, started on valsartan. Felt better on Entresto and is willing to restart if price is affordable. Her spironolactone which had been inadvertently stopped was restarted and she was scheduled for follow-up bmet in 2 weeks, however there is no record of this being completed. Advised to return in 3 months for follow-up.  Seen by me on 11/23/22 for increased shortness of breath and tachycardia. Felt like she did when she initially came in to see Dr. Acie Fredrickson July 2023. Not able to walk as far without SOB over the past 2 weeks. Sleeping on 3 pillows currently. Return of PND which had initially improved on [place].  No edema. Additionally, has a two year problem with severe diaphoresis with any exertion, and sometimes at rest. Does not feel flushed when this occurs but it is accompanied by chest pain and SOB. Symptoms do not occur at a certain time of day and started prior to starting cardiac medications.  Also has nasal congestion and cough, the symptoms preceded starting ARNI/ARB. Took torsemide 5 days in a row with some but no marked improvement. Now has cramping in back, hands, and legs. No presyncope, syncope. Felt better while taking Entresto but is not able to afford it. Is hoping insurance will cover it better in January. BNP was normal.  Creatinine was elevated at 1.41 but she had recently increased her torsemide for 5 days. TSH was  checked due to c/o profuse sweating. It was elevated at 8.730 for which we asked for assistance from PCP.   Today, she is here for follow-up and reports she is feeling better. Thinks the medications are making her feel better. Is going to apply for patient assistance, per her best estimate she will hit the donut hole in August. Felt dizzy walking in today and feels that her balance is affected. No presyncope, syncope.  Activity remains limited by shortness of  breath but she is motivated to increase exercise.  Admits she has not been very active. No orthopnea, edema, or PND. No chest pain or palpitations. Has gained 100 lbs since she retired. She is very emotional about this.   Past Medical History:  Diagnosis Date   Allergy    seasonal   Arthritis    Bursitis of hip    Left   Constipation    90%of the time c/o constipation, occ has diarrhea - uses stool softener    Endometriosis    Facial flushing 05/12/2014   Fibroid    GERD (gastroesophageal reflux disease)    Hematochezia    Hiatal hernia    HSV-1 (herpes simplex virus 1) infection    Hyperlipidemia    Hyperplastic colon polyp    Hypertension    Hypothyroidism    Ischemic colitis ([place]) 01/2019   LBBB (left bundle branch block)    Low back pain    Migraine    Osteopenia 2008   -1.2 FEMORAL NECK   Overdose of benzodiazepine 08/11/2021   Prediabetes    Stroke [facility])    TIA   TIA (transient ischemic attack) 08-10-2013    Past Surgical History:  Procedure Laterality Date   ABDOMINAL HYSTERECTOMY  1989   TAH, BSO   CHOLECYSTECTOMY  2003   COLONOSCOPY     KNEE SURGERY  2004   right   NASAL SEPTUM SURGERY  2012   PITUITARY SURGERY  2001   POLYPECTOMY     SHOULDER SURGERY  2007   right   THYROID LOBECTOMY  2002    Current Medications: Current Meds  Medication Sig   aspirin 81 MG tablet Take 81 mg by mouth daily.   atorvastatin (LIPITOR) 10 MG tablet TAKE 1 TABLET BY MOUTH DAILY FOR CHOLESTEROL   buPROPion ER (WELLBUTRIN SR) 100 MG 12 hr tablet TAKE 1 TABLET (100 MG TOTAL) BY MOUTH 2 (TWO) TIMES DAILY. FOR DEPRESSION   Cholecalciferol (D3) 10 MCG (400 UNIT) CHEW Chew by mouth.   DULoxetine (CYMBALTA) 30 MG capsule TAKE 1 CAPSULE BY MOUTH DAILY  FOR ANXIETY AND DEPRESSION TAKE  WITH 60 MG CAPSULE   DULoxetine (CYMBALTA) 60 MG capsule TAKE 1 CAPSULE BY MOUTH  DAILY FOR HEADACHE  PREVENTION   empagliflozin (JARDIANCE) 10 MG TABS tablet Take 1 tablet (10 mg total) by mouth  daily before breakfast.   ferrous sulfate 325 (65 FE) MG EC tablet Take 1 tablet (325 mg total) by mouth daily with breakfast.   lansoprazole (EQ LANSOPRAZOLE) 15 MG capsule Take 15 mg by mouth 2 (two) times daily.   levothyroxine (SYNTHROID) 75 MCG tablet TAKE 1 TABLET BY MOUTH IN THE  MORNING ON AN EMPTY STOMACH WITH WATER ONLY. NO FOOD OR OTHER  MEDICATIONS FOR 1/2 HOUR   magnesium 30 MG tablet Take 30 mg by mouth daily.   metoprolol succinate (TOPROL-XL) 50 MG 24 hr tablet Take 1 tablet (50 mg total) by mouth daily. Take with or immediately following a meal.  mirtazapine (REMERON) 15 MG tablet Take 1 tablet (15 mg total) by mouth at bedtime. For sleep   nitroGLYCERIN (NITROSTAT) 0.4 MG SL tablet Dissolve 1 tablet under the tongue every 5 minutes as needed for chest pain. Max of 3 doses, then 911.   potassium chloride (KLOR-CON) 10 MEQ tablet Take 2 tablets by mouth twice daily for 3-5 days, then decrease to 2 tablets once daily thereafter   spironolactone (ALDACTONE) 25 MG tablet Take 1 tablet (25 mg total) by mouth daily.   torsemide (DEMADEX) 20 MG tablet Take 1 tablet by mouth twice daily for 3-5 days, then decrease to 1 tablet daily thereafter   [DISCONTINUED] sacubitril-valsartan (ENTRESTO) 49-51 MG Take 1 tablet by mouth 2 (two) times daily.     Allergies:   Tramadol   Social History   Socioeconomic History   Marital status: Divorced    Spouse name: Not on file   Number of children: 2   Years of education: Not on file   Highest education level: Not on file  Occupational History    Employer: RETIRED  Tobacco Use   Smoking status: Never   Smokeless tobacco: Never  Vaping Use   Vaping Use: Never used  Substance and Sexual Activity   Alcohol use: Yes    Alcohol/week: 0.0 standard drinks of alcohol    Comment: 1 every 3 months   Drug use: No   Sexual activity: Not Currently    Comment: intercourse age 61, more than 5 sexual partners,des neg  Other Topics Concern   Not on  file  Social History Narrative   Single.   2 daughters, 4 grandchildren.   Once worked for [employer].   Enjoys Ambulance person.    Social Determinants of Health   Financial Resource Strain: Low Risk  (12/28/2021)   Overall Financial Resource Strain (CARDIA)    Difficulty of Paying Living Expenses: Not hard at all  Food Insecurity: No Food Insecurity (01/26/2019)   Hunger Vital Sign    Worried About Running Out of Food in the Last Year: Never true    Ran Out of Food in the Last Year: Never true  Transportation Needs: No Transportation Needs (01/26/2019)   PRAPARE - Hydrologist (Medical): No    Lack of Transportation (Non-Medical): No  Physical Activity: Unknown (01/26/2019)   Exercise Vital Sign    Days of Exercise per Week: Patient refused    Minutes of Exercise per Session: Patient refused  Stress: No Stress Concern Present (01/26/2019)   [place]    Feeling of Stress : Not at all  Social Connections: Not on file     Family History: The patient's family history includes Breast cancer in her maternal aunt; Cancer in her mother; Heart disease in her father; Parkinson's disease in her sister. There is no history of Colon cancer, Colon polyps, Rectal cancer, Stomach cancer, Thyroid disease, Esophageal cancer, or Pancreatic cancer.  ROS:   Please see the history of present illness. All other systems reviewed and are negative.  Labs/Other Studies Reviewed:    The following studies were reviewed today:  Echo 07/09/22 1. Left ventricular ejection fraction, by estimation, is 55 to 60%. The  left ventricle has normal function. The left ventricle has no regional  wall motion abnormalities. Left ventricular diastolic parameters are  indeterminate.   2. Right ventricular systolic function is normal. The right ventricular  size is normal.  3. The mitral valve is  normal in structure. No evidence of mitral valve  regurgitation. No evidence of mitral stenosis.   4. The aortic valve is normal in structure. Aortic valve regurgitation is  not visualized. No aortic stenosis is present.   5. There is mild dilatation of the ascending aorta, measuring 37 mm.   6. The inferior vena cava is normal in size with greater than 50%  respiratory variability, suggesting right atrial pressure of 3 mmHg.   CCTA 09/06/22 1. No evidence of CAD, 0% stenosis, CADRADS 0.   2. Coronary calcium score of 0.   3. Normal coronary origins with right dominance.   RECOMMENDATIONS: CAD-RADS 0. No evidence of CAD (0%). Consider non-atherosclerotic causes of chest pain.  Recent Labs: 07/19/2022: Magnesium 2.2 07/27/2022: ALT 10 09/27/2022: Hemoglobin 12.7; Platelets 317.0 11/23/2022: BUN 35; Creatinine, Ser 1.41; NT-Pro BNP 61; Potassium 5.0; Sodium 132; TSH 8.730  Recent Lipid Panel    Component Value Date/Time   CHOL 110 07/12/2022 1236   TRIG 163.0 (H) 07/12/2022 1236   HDL 42.60 07/12/2022 1236   CHOLHDL 3 07/12/2022 1236   VLDL 32.6 07/12/2022 1236   LDLCALC 34 07/12/2022 1236   LDLDIRECT 72.0 06/23/2021 1245     Risk Assessment/Calculations:       Physical Exam:    VS:  BP 118/78   Pulse 79   Ht '5\' 4"'$  (1.626 m)   Wt 239 lb 3.2 oz (108.5 kg)   SpO2 97%   BMI 41.06 kg/m     Wt Readings from Last 3 Encounters:  12/06/22 239 lb 3.2 oz (108.5 kg)  11/23/22 235 lb 9.6 oz (106.9 kg)  10/08/22 230 lb 3.2 oz (104.4 kg)     GEN:  Well nourished, well developed in no acute distress HEENT: Normal NECK: No JVD; No carotid bruits CARDIAC: RRR, no murmurs, rubs, gallops RESPIRATORY:  Clear to auscultation without rales, wheezing or rhonchi  ABDOMEN: Soft, non-tender, non-distended MUSCULOSKELETAL:  No edema; No deformity. 2+ pedal pulses, equal bilaterally SKIN: Warm and dry NEUROLOGIC:  Alert and oriented x 3 PSYCHIATRIC:  Normal affect   EKG:  EKG is not  ordered today  Diagnoses:    1. Chronic diastolic congestive heart failure, NYHA class 2 ([place])   2. Essential hypertension   3. Shortness of breath   4. LBBB (left bundle branch block)   5. Class 3 severe obesity with serious comorbidity and body mass index (BMI) of 40.0 to 44.9 in adult, unspecified obesity type ([place])   6. Heat intolerance   7. Congestion of throat     Assessment and Plan:     Chronic HFpEF: LVEF 55-60%, indeterminate diastolic parameters on echo 07/09/22. Referred for DOE July 2023, found to be volume overloaded and treated for HFpEF. Problems initially with the cost of Jardiance and Entresto but we have provided assistance and she is feeling like she can afford these medications right now. Feels better on current GDMT including Jardiance, metoprolol, Entresto, and spironolactone and torsemide. Will check bmp today.   LBBB: Known. LVEF normal on echo 06/2022. Will continue to monitor.   Obesity: Lengthy discussion about exercise and dietary modifications to aid in weight loss. She frequently skips lunch and then overeats at dinner time. We talked about how this causes the body to store calories as fat. Encouraged her to continue working on weight loss efforts and increasing activity to try to achieve 10000 steps daily.   Heat intolerance: Episodes of profuse sweating accompanied  by chest discomfort for many years. Events started prior to cardiac medications. TSH mildly elevated at 8.730 on 12/8. Was advised to follow-up with PCP who reached out to the patient. Does not feel that it is the cause of her sweating.   Hypertension: BP is well-controlled.    Congestion: Has frequent cough and congestion. Seen by ENT and told it was not 2/2 sinus issues. Was told she had allergies but she has not tried any antihistamines on a consistent basis.  Encouraged her to try either Zyrtec at night or Allegra during the day which is nondrowsy on a consistent basis.      Disposition: Keep  your January appointment with Dr. Acie Fredrickson  Medication Adjustments/Labs and Tests Ordered: Current medicines are reviewed at length with the patient today.  Concerns regarding medicines are outlined above.  Orders Placed This Encounter  Procedures   Basic Metabolic Panel (BMET)   Meds ordered this encounter  Medications   sacubitril-valsartan (ENTRESTO) 49-51 MG    Sig: Take 1 tablet by mouth 2 (two) times daily.    Dispense:  60 tablet    Refill:  11    Patient Instructions  Medication Instructions:   Your physician recommends that you continue on your current medications as directed. Please refer to the Current Medication list given to you today.  You can try Allegra OTC it is non-drowsy.  You can try Zyrtec OTC and take this at night.  *If you need a refill on your cardiac medications before your next appointment, please call your pharmacy*   Lab Work:  TODAY!!!! BMET  If you have labs (blood work) drawn today and your tests are completely normal, you will receive your results only by: [place] (if you have MyChart) OR A paper copy in the mail If you have any lab test that is abnormal or we need to change your treatment, we will call you to review the results.   Testing/Procedures:  None ordered.    Follow-Up: At [facility], you and your health needs are our priority.  As part of our continuing mission to provide you with exceptional heart care, we have created designated Provider Care Teams.  These Care Teams include your primary Cardiologist (physician) and Advanced Practice Providers (APPs -  Physician Assistants and Nurse Practitioners) who all work together to provide you with the care you need, when you need it.  We recommend signing up for the patient portal called "MyChart".  Sign up information is provided on this After Visit Summary.  MyChart is used to connect with patients for Virtual Visits (Telemedicine).  Patients are able to view  lab/test results, encounter notes, upcoming appointments, etc.  Non-urgent messages can be sent to your provider as well.   To learn more about what you can do with MyChart, go to [email or web address].    Your next appointment:   1 month(s)  The format for your next appointment:   In Person  Provider:   Mertie Moores, MD     Other Instructions  Mediterranean Diet A Mediterranean diet refers to food and lifestyle choices that are based on the traditions of countries located on the [place]. It focuses on eating more fruits, vegetables, whole grains, beans, nuts, seeds, and heart-healthy fats, and eating less dairy, meat, eggs, and processed foods with added sugar, salt, and fat. This way of eating has been shown to help prevent certain conditions and improve outcomes for people who have chronic diseases, like kidney disease and  heart disease. What are tips for following this plan? Reading food labels Check the serving size of packaged foods. For foods such as rice and pasta, the serving size refers to the amount of cooked product, not dry. Check the total fat in packaged foods. Avoid foods that have saturated fat or trans fats. Check the ingredient list for added sugars, such as corn syrup. Shopping  Buy a variety of foods that offer a balanced diet, including: Fresh fruits and vegetables (produce). Grains, beans, nuts, and seeds. Some of these may be available in unpackaged forms or large amounts (in bulk). Fresh seafood. Poultry and eggs. Low-fat dairy products. Buy whole ingredients instead of prepackaged foods. Buy fresh fruits and vegetables in-season from local farmers markets. Buy plain frozen fruits and vegetables. If you do not have access to quality fresh seafood, buy precooked frozen shrimp or canned fish, such as tuna, salmon, or sardines. Stock your pantry so you always have certain foods on hand, such as olive oil, canned tuna, canned tomatoes, rice, pasta,  and beans. Cooking Cook foods with extra-virgin olive oil instead of using butter or other vegetable oils. Have meat as a side dish, and have vegetables or grains as your main dish. This means having meat in small portions or adding small amounts of meat to foods like pasta or stew. Use beans or vegetables instead of meat in common dishes like chili or lasagna. Experiment with different cooking methods. Try roasting, broiling, steaming, and sauting vegetables. Add frozen vegetables to soups, stews, pasta, or rice. Add nuts or seeds for added healthy fats and plant protein at each meal. You can add these to yogurt, salads, or vegetable dishes. Marinate fish or vegetables using olive oil, lemon juice, garlic, and fresh herbs. Meal planning Plan to eat one vegetarian meal one day each week. Try to work up to two vegetarian meals, if possible. Eat seafood two or more times a week. Have healthy snacks readily available, such as: Vegetable sticks with hummus. Greek yogurt. Fruit and nut trail mix. Eat balanced meals throughout the week. This includes: Fruit: 2-3 servings a day. Vegetables: 4-5 servings a day. Low-fat dairy: 2 servings a day. Fish, poultry, or lean meat: 1 serving a day. Beans and legumes: 2 or more servings a week. Nuts and seeds: 1-2 servings a day. Whole grains: 6-8 servings a day. Extra-virgin olive oil: 3-4 servings a day. Limit red meat and sweets to only a few servings a month. Lifestyle  Cook and eat meals together with your family, when possible. Drink enough fluid to keep your urine pale yellow. Be physically active every day. This includes: Aerobic exercise like running or swimming. Leisure activities like gardening, walking, or housework. Get 7-8 hours of sleep each night. If recommended by your health care provider, drink red wine in moderation. This means 1 glass a day for nonpregnant women and 2 glasses a day for men. A glass of wine equals 5 oz (150  mL). What foods should I eat? Fruits Apples. Apricots. Avocado. Berries. Bananas. Cherries. Dates. Figs. Grapes. Lemons. Melon. Oranges. Peaches. Plums. Pomegranate. Vegetables Artichokes. Beets. Broccoli. Cabbage. Carrots. Eggplant. Green beans. Chard. Kale. Spinach. Onions. Leeks. Peas. Squash. Tomatoes. Peppers. Radishes. Grains Whole-grain pasta. Brown rice. Bulgur wheat. Polenta. Couscous. Whole-wheat bread. [place]. Meats and other proteins Beans. Almonds. Sunflower seeds. Pine nuts. Peanuts. [place]. Salmon. Scallops. Shrimp. [place]. Tilapia. Clams. Oysters. Eggs. Poultry without skin. Dairy Low-fat milk. Cheese. Greek yogurt. Fats and oils Extra-virgin olive oil. Avocado oil. Grapeseed  oil. Beverages Water. Red wine. Herbal tea. Sweets and desserts Greek yogurt with honey. Baked apples. Poached pears. Trail mix. Seasonings and condiments Basil. Cilantro. Coriander. Cumin. Mint. Parsley. Sage. Rosemary. Tarragon. Garlic. Oregano. Thyme. Pepper. Balsamic vinegar. Tahini. Hummus. Tomato sauce. Olives. Mushrooms. The items listed above may not be a complete list of foods and beverages you can eat. Contact a dietitian for more information. What foods should I limit? This is a list of foods that should be eaten rarely or only on special occasions. Fruits Fruit canned in syrup. Vegetables Deep-fried potatoes (french fries). Grains Prepackaged pasta or rice dishes. Prepackaged cereal with added sugar. Prepackaged snacks with added sugar. Meats and other proteins Beef. Pork. Lamb. Poultry with skin. Hot dogs. [place] Salines. Dairy Ice cream. Sour cream. Whole milk. Fats and oils Butter. Canola oil. Vegetable oil. Beef fat (tallow). Lard. Beverages Juice. Sugar-sweetened soft drinks. Beer. Liquor and spirits. Sweets and desserts Cookies. Cakes. Pies. Candy. Seasonings and condiments Mayonnaise. Pre-made sauces and marinades. The items listed above may not be a complete list of foods  and beverages you should limit. Contact a dietitian for more information. Summary The Mediterranean diet includes both food and lifestyle choices. Eat a variety of fresh fruits and vegetables, beans, nuts, seeds, and whole grains. Limit the amount of red meat and sweets that you eat. If recommended by your health care provider, drink red wine in moderation. This means 1 glass a day for nonpregnant women and 2 glasses a day for men. A glass of wine equals 5 oz (150 mL). This information is not intended to replace advice given to you by your health care provider. Make sure you discuss any questions you have with your health care provider. Document Revised: 01/08/2020 Document Reviewed: 11/05/2019 Elsevier Patient Education  [place]. Adopting a Healthy Lifestyle.   Weight: Know what a healthy weight is for you (roughly BMI <25) and aim to maintain this. You can calculate your body mass index on your smart phone  Diet: Aim for 7+ servings of fruits and vegetables daily Limit animal fats in diet for cholesterol and heart health - choose grass fed whenever available Avoid highly processed foods (fast food burgers, tacos, fried chicken, pizza, hot dogs, french fries)  Saturated fat comes in the form of butter, lard, coconut oil, margarine, partially hydrogenated oils, and fat in meat. These increase your risk of cardiovascular disease.  Use healthy plant oils, such as olive, canola, soy, corn, sunflower and peanut.  Whole foods such as fruits, vegetables and whole grains have fiber  Men need > 38 grams of fiber per day Women need > 25 grams of fiber per day  Load up on vegetables and fruits - one-half of your plate: Aim for color and variety, and remember that potatoes dont count. Go for whole grains - one-quarter of your plate: Whole wheat, barley, wheat berries, quinoa, oats, brown rice, and foods made with them. If you want pasta, go with whole wheat pasta. Protein power - one-quarter  of your plate: Fish, chicken, beans, and nuts are all healthy, versatile protein sources. Limit red meat. You need carbohydrates for energy! The type of carbohydrate is more important than the amount. Choose carbohydrates such as vegetables, fruits, whole grains, beans, and nuts in the place of white rice, white pasta, potatoes (baked or fried), macaroni and cheese, cakes, cookies, and donuts.  If youre thirsty, drink water. Coffee and tea are good in moderation, but skip sugary drinks and limit milk and dairy  products to one or two daily servings. Keep sugar intake at 6 teaspoons or 24 grams or LESS       Exercise: Aim for 150 min of moderate intensity exercise weekly for heart health, and weights twice weekly for bone health Stay active - any steps are better than no steps! Aim for 7-9 hours of sleep daily        Important Information About Sugar         Signed, Emmaline Life, NP  12/06/2022 4:38 PM    [place]

## 2022-12-06 ENCOUNTER — Ambulatory Visit: Payer: Medicare Other | Attending: Nurse Practitioner | Admitting: Nurse Practitioner

## 2022-12-06 ENCOUNTER — Encounter: Payer: Self-pay | Admitting: Nurse Practitioner

## 2022-12-06 VITALS — BP 118/78 | HR 79 | Ht 64.0 in | Wt 239.2 lb

## 2022-12-06 DIAGNOSIS — I10 Essential (primary) hypertension: Secondary | ICD-10-CM | POA: Diagnosis not present

## 2022-12-06 DIAGNOSIS — I5032 Chronic diastolic (congestive) heart failure: Secondary | ICD-10-CM

## 2022-12-06 DIAGNOSIS — R0602 Shortness of breath: Secondary | ICD-10-CM | POA: Diagnosis not present

## 2022-12-06 DIAGNOSIS — I447 Left bundle-branch block, unspecified: Secondary | ICD-10-CM | POA: Diagnosis not present

## 2022-12-06 DIAGNOSIS — I5033 Acute on chronic diastolic (congestive) heart failure: Secondary | ICD-10-CM | POA: Diagnosis not present

## 2022-12-06 DIAGNOSIS — R6889 Other general symptoms and signs: Secondary | ICD-10-CM

## 2022-12-06 DIAGNOSIS — Z6841 Body Mass Index (BMI) 40.0 and over, adult: Secondary | ICD-10-CM

## 2022-12-06 MED ORDER — ENTRESTO 49-51 MG PO TABS: 1.0000 | ORAL_TABLET | Freq: Two times a day (BID) | ORAL | 11 refills | Status: DC

## 2022-12-06 NOTE — Patient Instructions (Signed)
Medication Instructions:   Your physician recommends that you continue on your current medications as directed. Please refer to the Current Medication list given to you today.  You can try Allegra OTC it is non-drowsy.  You can try Zyrtec OTC and take this at night.  *If you need a refill on your cardiac medications before your next appointment, please call your pharmacy*   Lab Work:  TODAY!!!! BMET  If you have labs (blood work) drawn today and your tests are completely normal, you will receive your results only by: [place] (if you have MyChart) OR A paper copy in the mail If you have any lab test that is abnormal or we need to change your treatment, we will call you to review the results.   Testing/Procedures:  None ordered.    Follow-Up: At [facility], you and your health needs are our priority.  As part of our continuing mission to provide you with exceptional heart care, we have created designated Provider Care Teams.  These Care Teams include your primary Cardiologist (physician) and Advanced Practice Providers (APPs -  Physician Assistants and Nurse Practitioners) who all work together to provide you with the care you need, when you need it.  We recommend signing up for the patient portal called "MyChart".  Sign up information is provided on this After Visit Summary.  MyChart is used to connect with patients for Virtual Visits (Telemedicine).  Patients are able to view lab/test results, encounter notes, upcoming appointments, etc.  Non-urgent messages can be sent to your provider as well.   To learn more about what you can do with MyChart, go to [email or web address].    Your next appointment:   1 month(s)  The format for your next appointment:   In Person  Provider:   Mertie Moores, MD     Other Instructions  Mediterranean Diet A Mediterranean diet refers to food and lifestyle choices that are based on the traditions of countries located on  the [place]. It focuses on eating more fruits, vegetables, whole grains, beans, nuts, seeds, and heart-healthy fats, and eating less dairy, meat, eggs, and processed foods with added sugar, salt, and fat. This way of eating has been shown to help prevent certain conditions and improve outcomes for people who have chronic diseases, like kidney disease and heart disease. What are tips for following this plan? Reading food labels Check the serving size of packaged foods. For foods such as rice and pasta, the serving size refers to the amount of cooked product, not dry. Check the total fat in packaged foods. Avoid foods that have saturated fat or trans fats. Check the ingredient list for added sugars, such as corn syrup. Shopping  Buy a variety of foods that offer a balanced diet, including: Fresh fruits and vegetables (produce). Grains, beans, nuts, and seeds. Some of these may be available in unpackaged forms or large amounts (in bulk). Fresh seafood. Poultry and eggs. Low-fat dairy products. Buy whole ingredients instead of prepackaged foods. Buy fresh fruits and vegetables in-season from local farmers markets. Buy plain frozen fruits and vegetables. If you do not have access to quality fresh seafood, buy precooked frozen shrimp or canned fish, such as tuna, salmon, or sardines. Stock your pantry so you always have certain foods on hand, such as olive oil, canned tuna, canned tomatoes, rice, pasta, and beans. Cooking Cook foods with extra-virgin olive oil instead of using butter or other vegetable oils. Have meat as a side dish, and  have vegetables or grains as your main dish. This means having meat in small portions or adding small amounts of meat to foods like pasta or stew. Use beans or vegetables instead of meat in common dishes like chili or lasagna. Experiment with different cooking methods. Try roasting, broiling, steaming, and sauting vegetables. Add frozen vegetables to  soups, stews, pasta, or rice. Add nuts or seeds for added healthy fats and plant protein at each meal. You can add these to yogurt, salads, or vegetable dishes. Marinate fish or vegetables using olive oil, lemon juice, garlic, and fresh herbs. Meal planning Plan to eat one vegetarian meal one day each week. Try to work up to two vegetarian meals, if possible. Eat seafood two or more times a week. Have healthy snacks readily available, such as: Vegetable sticks with hummus. Greek yogurt. Fruit and nut trail mix. Eat balanced meals throughout the week. This includes: Fruit: 2-3 servings a day. Vegetables: 4-5 servings a day. Low-fat dairy: 2 servings a day. Fish, poultry, or lean meat: 1 serving a day. Beans and legumes: 2 or more servings a week. Nuts and seeds: 1-2 servings a day. Whole grains: 6-8 servings a day. Extra-virgin olive oil: 3-4 servings a day. Limit red meat and sweets to only a few servings a month. Lifestyle  Cook and eat meals together with your family, when possible. Drink enough fluid to keep your urine pale yellow. Be physically active every day. This includes: Aerobic exercise like running or swimming. Leisure activities like gardening, walking, or housework. Get 7-8 hours of sleep each night. If recommended by your health care provider, drink red wine in moderation. This means 1 glass a day for nonpregnant women and 2 glasses a day for men. A glass of wine equals 5 oz (150 mL). What foods should I eat? Fruits Apples. Apricots. Avocado. Berries. Bananas. Cherries. Dates. Figs. Grapes. Lemons. Melon. Oranges. Peaches. Plums. Pomegranate. Vegetables Artichokes. Beets. Broccoli. Cabbage. Carrots. Eggplant. Green beans. Chard. Kale. Spinach. Onions. Leeks. Peas. Squash. Tomatoes. Peppers. Radishes. Grains Whole-grain pasta. Brown rice. Bulgur wheat. Polenta. Couscous. Whole-wheat bread. [place]. Meats and other proteins Beans. Almonds. Sunflower seeds.  Pine nuts. Peanuts. [place]. Salmon. Scall[place]. Shrimp. [place]. Tilapia. Clams. Oysters. Eggs. Poultry without skin. Dairy Low-fat milk. Cheese. Greek yogurt. Fats and oils Extra-virgin olive oil. Avocado oil. Grapeseed oil. Beverages Water. Red wine. Herbal tea. Sweets and desserts Greek yogurt with honey. Baked apples. Poached pears. Trail mix. Seasonings and condiments Basil. Cilantro. Coriander. Cumin. Mint. Parsley. Sage. Rosemary. Tarragon. Garlic. Oregano. Thyme. Pepper. Balsamic vinegar. Tahini. Hummus. Tomato sauce. Olives. Mushrooms. The items listed above may not be a complete list of foods and beverages you can eat. Contact a dietitian for more information. What foods should I limit? This is a list of foods that should be eaten rarely or only on special occasions. Fruits Fruit canned in syrup. Vegetables Deep-fried potatoes (french fries). Grains Prepackaged pasta or rice dishes. Prepackaged cereal with added sugar. Prepackaged snacks with added sugar. Meats and other proteins Beef. Pork. Lamb. Poultry with skin. Hot dogs. Bernie[place] Salines. Dairy Ice cream. Sour cream. Whole milk. Fats and oils Butter. Canola oil. Vegetable oil. Beef fat (tallow). Lard. Beverages Juice. Sugar-sweetened soft drinks. Beer. Liquor and spirits. Sweets and desserts Cookies. Cakes. Pies. Candy. Seasonings and condiments Mayonnaise. Pre-made sauces and marinades. The items listed above may not be a complete list of foods and beverages you should limit. Contact a dietitian for more information. Summary The Mediterranean diet includes both food and  lifestyle choices. Eat a variety of fresh fruits and vegetables, beans, nuts, seeds, and whole grains. Limit the amount of red meat and sweets that you eat. If recommended by your health care provider, drink red wine in moderation. This means 1 glass a day for nonpregnant women and 2 glasses a day for men. A glass of wine equals 5 oz (150 mL). This information is  not intended to replace advice given to you by your health care provider. Make sure you discuss any questions you have with your health care provider. Document Revised: 01/08/2020 Document Reviewed: 11/05/2019 Elsevier Patient Education  [place]. Adopting a Healthy Lifestyle.   Weight: Know what a healthy weight is for you (roughly BMI <25) and aim to maintain this. You can calculate your body mass index on your smart phone  Diet: Aim for 7+ servings of fruits and vegetables daily Limit animal fats in diet for cholesterol and heart health - choose grass fed whenever available Avoid highly processed foods (fast food burgers, tacos, fried chicken, pizza, hot dogs, french fries)  Saturated fat comes in the form of butter, lard, coconut oil, margarine, partially hydrogenated oils, and fat in meat. These increase your risk of cardiovascular disease.  Use healthy plant oils, such as olive, canola, soy, corn, sunflower and peanut.  Whole foods such as fruits, vegetables and whole grains have fiber  Men need > 38 grams of fiber per day Women need > 25 grams of fiber per day  Load up on vegetables and fruits - one-half of your plate: Aim for color and variety, and remember that potatoes dont count. Go for whole grains - one-quarter of your plate: Whole wheat, barley, wheat berries, quinoa, oats, brown rice, and foods made with them. If you want pasta, go with whole wheat pasta. Protein power - one-quarter of your plate: Fish, chicken, beans, and nuts are all healthy, versatile protein sources. Limit red meat. You need carbohydrates for energy! The type of carbohydrate is more important than the amount. Choose carbohydrates such as vegetables, fruits, whole grains, beans, and nuts in the place of white rice, white pasta, potatoes (baked or fried), macaroni and cheese, cakes, cookies, and donuts.  If youre thirsty, drink water. Coffee and tea are good in moderation, but skip sugary drinks and  limit milk and dairy products to one or two daily servings. Keep sugar intake at 6 teaspoons or 24 grams or LESS       Exercise: Aim for 150 min of moderate intensity exercise weekly for heart health, and weights twice weekly for bone health Stay active - any steps are better than no steps! Aim for 7-9 hours of sleep daily        Important Information About Sugar

## 2022-12-07 LAB — BASIC METABOLIC PANEL
BUN/Creatinine Ratio: 19 (ref 12–28)
BUN: 17 mg/dL (ref 8–27)
CO2: 24 mmol/L (ref 20–29)
Calcium: 9.4 mg/dL (ref 8.7–10.3)
Chloride: 105 mmol/L (ref 96–106)
Creatinine, Ser: 0.91 mg/dL (ref 0.57–1.00)
Glucose: 92 mg/dL (ref 70–99)
Potassium: 5.1 mmol/L (ref 3.5–5.2)
Sodium: 138 mmol/L (ref 134–144)
eGFR: 67 mL/min/{1.73_m2} (ref >59)

## 2022-12-08 ENCOUNTER — Other Ambulatory Visit: Payer: Self-pay | Admitting: Primary Care

## 2022-12-08 DIAGNOSIS — G479 Sleep disorder, unspecified: Secondary | ICD-10-CM

## 2022-12-11 NOTE — Telephone Encounter (Signed)
Spoke with patient to follow up on a My Chart message. Patient reports feeling short of breath with minimal activity. She stopped taking torsemide 2 days due to muscle cramps. Patient did not report any swelling or weight changes or chest pain. She states she tried to walk her dog yesterday and had to sit down on curb and call for her daughter. Advised patient to go to the ED for evaluation, as she needs someone to assess her to better evaluate her medical needs. Patient verbalized agreement and states she will go to the ED.

## 2022-12-12 ENCOUNTER — Encounter (HOSPITAL_COMMUNITY): Payer: Self-pay | Admitting: *Deleted

## 2022-12-12 ENCOUNTER — Emergency Department (HOSPITAL_COMMUNITY)
Admission: EM | Admit: 2022-12-12 | Discharge: 2022-12-12 | Discharge status: Against Medical Advice | Payer: Medicare Other | Attending: Emergency Medicine | Admitting: Emergency Medicine

## 2022-12-12 ENCOUNTER — Other Ambulatory Visit: Payer: Self-pay

## 2022-12-12 ENCOUNTER — Ambulatory Visit: Payer: Self-pay | Admitting: *Deleted

## 2022-12-12 ENCOUNTER — Emergency Department (HOSPITAL_COMMUNITY): Payer: Medicare Other

## 2022-12-12 DIAGNOSIS — I509 Heart failure, unspecified: Secondary | ICD-10-CM | POA: Diagnosis not present

## 2022-12-12 DIAGNOSIS — I5030 Unspecified diastolic (congestive) heart failure: Secondary | ICD-10-CM | POA: Insufficient documentation

## 2022-12-12 DIAGNOSIS — M62838 Other muscle spasm: Secondary | ICD-10-CM | POA: Diagnosis not present

## 2022-12-12 DIAGNOSIS — R0602 Shortness of breath: Secondary | ICD-10-CM | POA: Diagnosis not present

## 2022-12-12 DIAGNOSIS — Z5321 Procedure and treatment not carried out due to patient leaving prior to being seen by health care provider: Secondary | ICD-10-CM | POA: Diagnosis not present

## 2022-12-12 HISTORY — DX: Heart failure, unspecified: I50.9

## 2022-12-12 NOTE — ED Triage Notes (Signed)
Pt states she was diagnosed with diastolic heart failure and for 4 days, any exertion she becomes very sob. She feels like she just can't get enough breath. She was taking torsemide, stopped doing it because she started having a spasm in her back and side. Sent here for possible fluid overload.

## 2022-12-12 NOTE — ED Notes (Signed)
Pt approached this tech at the sort desk and said " I am going to have to leave the wait is way too long." Pt exited the ED lobby.

## 2022-12-12 NOTE — Telephone Encounter (Signed)
Reason for Disposition  [1] MODERATE difficulty breathing (e.g., speaks in phrases, SOB even at rest, pulse 100-120) AND [2] NEW-onset or WORSE than normal    Told to come to the ED b her cardiologist  Answer Assessment - Initial Assessment Questions 1. RESPIRATORY STATUS: "Describe your breathing?" (e.g., wheezing, shortness of breath, unable to speak, severe coughing)      Pt. Is in the ED now    My cardiologist told me to come to the ED.   I'm here now at [facility] ED.   It's packed.   They told me this one or [facility] I could come to.    I was wanting to know what the wait time is.   It's packed here.   I let her know it's crowded at all the EDs now.    So she has decided to stay at [facility] ED. 2. ONSET: "When did this breathing problem begin?"      I'm having shortness of breath and chest pain.   No chest pain now.    I have angina.   I'm having trouble breathing for the last 3-4 days.   I have diastolic heart failure too so my cardiologist told me to go to the ED.  3. PATTERN "Does the difficult breathing come and go, or has it been constant since it started?"      3-4 days I've been having trouble with breathing. 4. SEVERITY: "How bad is your breathing?" (e.g., mild, moderate, severe)    - MILD: No SOB at rest, mild SOB with walking, speaks normally in sentences, can lie down, no retractions, pulse < 100.    - MODERATE: SOB at rest, SOB with minimal exertion and prefers to sit, cannot lie down flat, speaks in phrases, mild retractions, audible wheezing, pulse 100-120.    - SEVERE: Very SOB at rest, speaks in single words, struggling to breathe, sitting hunched forward, retractions, pulse > 120      Moderate 5. RECURRENT SYMPTOM: "Have you had difficulty breathing before?" If Yes, ask: "When was the last time?" and "What happened that time?"      Not asked since cardiologist instructed her to go to the ED. 6. CARDIAC HISTORY: "Do you have any history of heart disease?" (e.g., heart  attack, angina, bypass surgery, angioplasty)      Yes   See above 7. LUNG HISTORY: "Do you have any history of lung disease?"  (e.g., pulmonary embolus, asthma, emphysema)     Not asked 8. CAUSE: "What do you think is causing the breathing problem?"      I have diastolic heart failure and fluid build up in my lungs at times. 9. OTHER SYMPTOMS: "Do you have any other symptoms? (e.g., dizziness, runny nose, cough, chest pain, fever)     Intermittent chest pain but not having it now.   I have angina. 10. O2 SATURATION MONITOR:  "Do you use an oxygen saturation monitor (pulse oximeter) at home?" If Yes, ask: "What is your reading (oxygen level) today?" "What is your usual oxygen saturation reading?" (e.g., 95%)       N/A 11. PREGNANCY: "Is there any chance you are pregnant?" "When was your last menstrual period?"       N/A due to age 71. TRAVEL: "Have you traveled out of the country in the last month?" (e.g., travel history, exposures)       N/A  Protocols used: Breathing Difficulty-A-AH  Chief Complaint: shortness of breath.   She was instructed  to go to the ED by her cardiologist.   She called in from [place] ED to see what the wait times were for other EDs especially [place] location.   I let her know all the EDs were very busy and I wasn't able to tell her a wait time.   People are triaged as they come in.    If her cardiologist felt she needed to be at the ED to stay at [facility]. Symptoms: shortness of breath for 3-4 days and intermittent chest pain Frequency: Intermittently chest pain Pertinent Negatives: Patient denies Having chest pain now. Disposition: '[x]'$ ED /'[]'$ Urgent Care (no appt availability in office) / '[]'$ Appointment(In office/virtual)/ '[]'$  [facility]/ '[]'$ Home Care/ '[]'$ Refused Recommended Disposition /'[]'$ [facility]/ '[]'$  Follow-up with PCP Additional Notes: Pt already at [facility] ED.

## 2022-12-12 NOTE — Progress Notes (Addendum)
Cardiology Office Note:    Date:  12/15/2022   ID:  Denise [name], DOB [birth year], MRN [id number]  PCP:  Pleas Koch, NP   [facility] Providers Cardiologist:  Mertie Moores, MD     Referring MD: Pleas Koch, NP   Chief Complaint: shortness of breath, heart racing  History of Present Illness:    Denise [name] is a very pleasant 71 y.o. female with a hx of LBBB, family history CAD (father MI mid 58s, grandmother stroke), chronic HFpEF, coronary CTA 08/2022 coronary calcium score 0, normal coronary arteries, obesity, iron deficiency anemia, and HTN  Referred to cardiology and seen by Dr. Acie Fredrickson on 07/16/2022 for shortness of breath.  Echocardiogram 07/09/2022 reveals normal LVEF 55 to 60%, no significant valve disease, trivial TR, indeterminate diastolic parameters.  Ports she runs out of breath while walking and while getting dressed.  Feels like she is unable to take a deep breath.  Leg edema gradually worsening, took Lasix for 5 days but does not think it helped.  Has 2 pillow orthopnea worsening over the past few months.  25 lb weight gain over the previous year, no regular exercise.  Does not add much salt and recently had been trying to avoid processed food. CT showed groundglass appearance.  She was felt to have acute on chronic diastolic congestive heart failure and was started on torsemide 20 mg daily, K-Dur 10 milliequivalents daily, and Entresto 49-51 twice daily. Amlodipine was discontinued and Jardiance 10 mg daily was started.  At follow-up visit 1 week later, weight down 6 lbs, she was feeling better but also feeling washed out. No longer having orthopnea.  She was advised to start spironolactone 25 mg daily and to decrease torsemide and potassium to 3 days weekly.  Office visit 09/04/22, was planned for GI procedures but they were canceled due to chest pain. Coronary calcium score of 0 on CCTA 08/27/2022.  She was started on isosorbide but stopped due to  severe headache.   Cardiology clinic visit was 10/08/2022 with Dr. Acie Fredrickson. Had to stop Entresto due to cost, started on valsartan. Felt better on Entresto and is willing to restart if price is affordable. Her spironolactone which had been inadvertently stopped was restarted and she was scheduled for follow-up bmet in 2 weeks, however there is no record of this being completed. Advised to return in 3 months for follow-up.  Seen by me on 11/23/22 for increased shortness of breath and tachycardia. Felt like she did when she initially came in to see Dr. Acie Fredrickson July 2023. Not able to walk as far without SOB over the past 2 weeks. Sleeping on 3 pillows currently. Return of PND which had initially improved on [place].  No edema. Additionally, has a two year problem with severe diaphoresis with any exertion, and sometimes at rest. Does not feel flushed when this occurs but it is accompanied by chest pain and SOB. Symptoms do not occur at a certain time of day and started prior to starting cardiac medications.  Also has nasal congestion and cough, the symptoms preceded starting ARNI/ARB. Took torsemide 5 days in a row with some but no marked improvement. Now has cramping in back, hands, and legs. No presyncope, syncope. Felt better while taking Entresto but is not able to afford it. Is hoping insurance will cover it better in January. BNP was normal.  Creatinine was elevated at 1.41 but she had recently increased her torsemide for 5 days. TSH was  checked due to c/o profuse sweating. It was elevated at 8.730 for which we asked for assistance from PCP.   I saw her on 12/06/22 for follow-up and she reported feeling better. Thinks the medications are making her feel better. Is going to apply for patient assistance, per her best estimate she will hit the donut hole in August. Felt dizzy walking in today and feels that her balance is affected. No presyncope, syncope.  Activity remains limited by shortness of  breath but she is motivated to increase exercise.  Admits she has not been very active. No orthopnea, edema, or PND. No chest pain or palpitations. Has gained 100 lbs since she retired. She is very emotional about this.  She contacted our office 12/11/22 to report increased shortness of breath. She had stopped her torsemide.   Today, she reports she began to feel poorly on 12/23 and has had difficulty completing activities since that time. Reports worst day was 12/10/22. She was trying to cook, wash dishes, and prepare to go to her daughter's house but her heart rate would speed up and she would have to sit and rest. Could only complete a few minutes of activities without resting. On one occasion during these symptoms BP was 93/72, HR was 101.  She feels like she cannot take a deep breath and on occasion gets very lightheaded when breathing. Stopped the torsemide because of pain in left side under left breast that woke her up during the night. The pain stopped when she stopped torsemide.  She has mild chest pressure at times with the shortness of breath. No edema, orthopnea, PND, presyncope or syncope. Continues to avoid sodium and limits fluids to < 2L. Reports this was a significant step back and how she was feeling when she saw me on 12/21. Went to ED on 12/27 and had EKG and CXR which were unremarkable. Waited 7 hours and then went home. No recent symptoms of cold or respiratory illness, no fever or chills.   Past Medical History:  Diagnosis Date   Allergy    seasonal   Arthritis    Bursitis of hip    Left   CHF (congestive heart failure) ([place])    Constipation    90%of the time c/o constipation, occ has diarrhea - uses stool softener    Endometriosis    Facial flushing 05/12/2014   Fibroid    GERD (gastroesophageal reflux disease)    Hematochezia    Hiatal hernia    HSV-1 (herpes simplex virus 1) infection    Hyperlipidemia    Hyperplastic colon polyp    Hypertension    Hypothyroidism     Ischemic colitis ([place]) 01/2019   LBBB (left bundle branch block)    Low back pain    Migraine    Osteopenia 2008   -1.2 FEMORAL NECK   Overdose of benzodiazepine 08/11/2021   Prediabetes    Stroke [facility])    TIA   TIA (transient ischemic attack) 08/10/2013    Past Surgical History:  Procedure Laterality Date   ABDOMINAL HYSTERECTOMY  1989   TAH, BSO   CHOLECYSTECTOMY  2003   COLONOSCOPY     KNEE SURGERY  2004   right   NASAL SEPTUM SURGERY  2012   PITUITARY SURGERY  2001   POLYPECTOMY     SHOULDER SURGERY  2007   right   THYROID LOBECTOMY  2002    Current Medications: Current Meds  Medication Sig   aspirin 81 MG tablet Take  81 mg by mouth daily.   atorvastatin (LIPITOR) 10 MG tablet TAKE 1 TABLET BY MOUTH DAILY FOR CHOLESTEROL   buPROPion ER (WELLBUTRIN SR) 100 MG 12 hr tablet TAKE 1 TABLET (100 MG TOTAL) BY MOUTH 2 (TWO) TIMES DAILY. FOR DEPRESSION   Cholecalciferol (D3) 10 MCG (400 UNIT) CHEW Chew by mouth.   DULoxetine (CYMBALTA) 30 MG capsule TAKE 1 CAPSULE BY MOUTH DAILY  FOR ANXIETY AND DEPRESSION TAKE  WITH 60 MG CAPSULE   DULoxetine (CYMBALTA) 60 MG capsule TAKE 1 CAPSULE BY MOUTH  DAILY FOR HEADACHE  PREVENTION   empagliflozin (JARDIANCE) 10 MG TABS tablet Take 1 tablet (10 mg total) by mouth daily before breakfast.   ferrous sulfate 325 (65 FE) MG EC tablet Take 1 tablet (325 mg total) by mouth daily with breakfast.   furosemide (LASIX) 20 MG tablet Take 1 tablet (20 mg total) by mouth daily.   lansoprazole (EQ LANSOPRAZOLE) 15 MG capsule Take 15 mg by mouth 2 (two) times daily.   levothyroxine (SYNTHROID) 75 MCG tablet TAKE 1 TABLET BY MOUTH IN THE  MORNING ON AN EMPTY STOMACH WITH WATER ONLY. NO FOOD OR OTHER  MEDICATIONS FOR 1/2 HOUR   magnesium 30 MG tablet Take 30 mg by mouth daily.   metoprolol succinate (TOPROL-XL) 50 MG 24 hr tablet Take 1 tablet (50 mg total) by mouth daily. Take with or immediately following a meal.   mirtazapine (REMERON) 15 MG  tablet Take 1 tablet (15 mg total) by mouth at bedtime. For sleep   nitroGLYCERIN (NITROSTAT) 0.4 MG SL tablet Dissolve 1 tablet under the tongue every 5 minutes as needed for chest pain. Max of 3 doses, then 911.   sacubitril-valsartan (ENTRESTO) 49-51 MG Take 1 tablet by mouth 2 (two) times daily.   spironolactone (ALDACTONE) 25 MG tablet Take 1 tablet (25 mg total) by mouth daily.     Allergies:   Tramadol   Social History   Socioeconomic History   Marital status: Divorced    Spouse name: Not on file   Number of children: 2   Years of education: Not on file   Highest education level: Not on file  Occupational History    Employer: RETIRED  Tobacco Use   Smoking status: Never   Smokeless tobacco: Never  Vaping Use   Vaping Use: Never used  Substance and Sexual Activity   Alcohol use: Yes    Alcohol/week: 0.0 standard drinks of alcohol    Comment: 1 every 3 months   Drug use: No   Sexual activity: Not Currently    Comment: intercourse age 1, more than 5 sexual partners,des neg  Other Topics Concern   Not on file  Social History Narrative   Single.   2 daughters, 4 grandchildren.   Once worked for [employer].   Enjoys Ambulance person.    Social Determinants of Health   Financial Resource Strain: Low Risk  (12/28/2021)   Overall Financial Resource Strain (CARDIA)    Difficulty of Paying Living Expenses: Not hard at all  Food Insecurity: No Food Insecurity (01/26/2019)   Hunger Vital Sign    Worried About Running Out of Food in the Last Year: Never true    Ran Out of Food in the Last Year: Never true  Transportation Needs: No Transportation Needs (01/26/2019)   PRAPARE - Hydrologist (Medical): No    Lack of Transportation (Non-Medical): No  Physical Activity: Unknown (01/26/2019)  Exercise Vital Sign    Days of Exercise per Week: Patient refused    Minutes of Exercise per Session: Patient refused  Stress: No  Stress Concern Present (01/26/2019)   [place]    Feeling of Stress : Not at all  Social Connections: Not on file     Family History: The patient's family history includes Breast cancer in her maternal aunt; Cancer in her mother; Heart disease in her father; Parkinson's disease in her sister. There is no history of Colon cancer, Colon polyps, Rectal cancer, Stomach cancer, Thyroid disease, Esophageal cancer, or Pancreatic cancer.  ROS:   Please see the history of present illness.  + shortness of breath All other systems reviewed and are negative.  Labs/Other Studies Reviewed:    The following studies were reviewed today:  Echo 07/09/22 1. Left ventricular ejection fraction, by estimation, is 55 to 60%. The  left ventricle has normal function. The left ventricle has no regional  wall motion abnormalities. Left ventricular diastolic parameters are  indeterminate.   2. Right ventricular systolic function is normal. The right ventricular  size is normal.   3. The mitral valve is normal in structure. No evidence of mitral valve  regurgitation. No evidence of mitral stenosis.   4. The aortic valve is normal in structure. Aortic valve regurgitation is  not visualized. No aortic stenosis is present.   5. There is mild dilatation of the ascending aorta, measuring 37 mm.   6. The inferior vena cava is normal in size with greater than 50%  respiratory variability, suggesting right atrial pressure of 3 mmHg.   CCTA 09/06/22 1. No evidence of CAD, 0% stenosis, CADRADS 0.   2. Coronary calcium score of 0.   3. Normal coronary origins with right dominance.   RECOMMENDATIONS: CAD-RADS 0. No evidence of CAD (0%). Consider non-atherosclerotic causes of chest pain.  Recent Labs: 07/19/2022: Magnesium 2.2 07/27/2022: ALT 10 09/27/2022: Hemoglobin 12.7; Platelets 317.0 11/23/2022: TSH 8.730 12/14/2022: BUN 22; Creatinine, Ser  1.01; NT-Pro BNP 120; Potassium 4.7; Sodium 140  Recent Lipid Panel    Component Value Date/Time   CHOL 110 07/12/2022 1236   TRIG 163.0 (H) 07/12/2022 1236   HDL 42.60 07/12/2022 1236   CHOLHDL 3 07/12/2022 1236   VLDL 32.6 07/12/2022 1236   LDLCALC 34 07/12/2022 1236   LDLDIRECT 72.0 06/23/2021 1245     Risk Assessment/Calculations:       Physical Exam:    VS:  BP 124/78   Pulse 85   Ht '5\' 4"'$  (1.626 m)   Wt 240 lb 12.8 oz (109.2 kg)   SpO2 96%   BMI 41.33 kg/m     Wt Readings from Last 3 Encounters:  12/14/22 240 lb 12.8 oz (109.2 kg)  12/06/22 239 lb 3.2 oz (108.5 kg)  11/23/22 235 lb 9.6 oz (106.9 kg)     GEN:  Well nourished, well developed in no acute distress HEENT: Normal NECK: No JVD; No carotid bruits CARDIAC: RRR, no murmurs, rubs, gallops RESPIRATORY:  Increased WOB. Clear to auscultation without rales, wheezing or rhonchi  ABDOMEN: Soft, non-tender, non-distended MUSCULOSKELETAL:  No edema; No deformity. 2+ pedal pulses, equal bilaterally SKIN: Warm and dry NEUROLOGIC:  Alert and oriented x 3 PSYCHIATRIC:  Normal affect   EKG:  EKG is not ordered today  Diagnoses:    1. Shortness of breath   2. Chronic diastolic congestive heart failure, NYHA class 2 ([place])   3.  Essential hypertension   4. Acute respiratory distress      Assessment and Plan:     Shortness of breath/Acute respiratory distress: Increased work of breathing on exam today. Also has increased HR with deep breathing and some chest tightness. Became lightheaded after taking several deep breaths. No evidence of significant volume overload. No CAD on recent coronary CTA. Symptoms concerning for acute PE although she has not had recent precipitating factors. Will get stat d-dimer and order CTA for evaluation of PE. Advised her that if CT shows PE, she will have to go to the ED for evaluation. Also checking BNP. Will consider repeat echo if CT and labs are unremarkable.   Chronic HFpEF:  LVEF 55-60%, indeterminate diastolic parameters on echo 07/09/22. Has been feeling better on GDMT until 12/23 when she developed significantly worse SOB. No orthopnea, PND, edema. Will recheck BNP today. Continue GDMT including Jardiance, metoprolol, Entresto, and spironolactone. Scr and electrolytes stable on 12/21, will recheck today. Intolerant of torsemide, will have her resume Lasix 20 mg and Kdur 10 mEq daily. Continue low sodium diet.   LBBB: Known. LVEF normal on echo 06/2022. Will continue to monitor.   Hypertension: BP is well-controlled.      Disposition: 2 weeks with me Addendum: Chest CTA negative for pulmonary embolism. BNP unremarkable. Clinical exam increased work of breathing with attempting to take deep breaths and with exertion. Will refer her to pulmonology for evaluation.    Medication Adjustments/Labs and Tests Ordered: Current medicines are reviewed at length with the patient today.  Concerns regarding medicines are outlined above.  Orders Placed This Encounter  Procedures   CT Angio Chest Pulmonary Embolism (PE) W or WO Contrast   D-Dimer, Quantitative   Pro b natriuretic peptide   Basic Metabolic Panel (BMET)   Meds ordered this encounter  Medications   potassium chloride (KLOR-CON) 10 MEQ tablet    Sig: Take 1 tablet (10 mEq total) by mouth daily.    Dispense:  90 tablet    Refill:  3   furosemide (LASIX) 20 MG tablet    Sig: Take 1 tablet (20 mg total) by mouth daily.    Dispense:  90 tablet    Refill:  3    Patient Instructions  Medication Instructions:   START Lasix one (1) tablet by mouth ( 20 mg) daily.   START K-Dur one (1) tablet by mouth (10 mEq) daily.    *If you need a refill on your cardiac medications before your next appointment, please call your pharmacy*   Lab Work:  TODAY STAT!!  D DIMER,PRO BNP,BMET  If you have labs (blood work) drawn today and your tests are completely normal, you will receive your results only by: [employer] (if you have MyChart) OR A paper copy in the mail If you have any lab test that is abnormal or we need to change your treatment, we will call you to review the results.   Testing/Procedures:  Non-Cardiac CT Angiography (CTA), is a special type of CT scan that uses a computer to produce multi-dimensional views of major blood vessels throughout the body. In CT angiography, a contrast material is injected through an IV to help visualize the blood vessels   Follow-Up: At [facility], you and your health needs are our priority.  As part of our continuing mission to provide you with exceptional heart care, we have created designated Provider Care Teams.  These Care Teams include your primary Cardiologist (physician) and Advanced  Practice Providers (APPs -  Physician Assistants and Nurse Practitioners) who all work together to provide you with the care you need, when you need it.  We recommend signing up for the patient portal called "MyChart".  Sign up information is provided on this After Visit Summary.  MyChart is used to connect with patients for Virtual Visits (Telemedicine).  Patients are able to view lab/test results, encounter notes, upcoming appointments, etc.  Non-urgent messages can be sent to your provider as well.   To learn more about what you can do with MyChart, go to [email or web address].    Your next appointment:   2 week(s)  The format for your next appointment:   In Person  Provider:   Christen Bame, NP          Important Information About Sugar         Signed, Emmaline Life, NP  12/15/2022 8:09 AM    [place]

## 2022-12-12 NOTE — ED Provider Triage Note (Signed)
Emergency Medicine Provider Triage Evaluation Note  Sandara Tyree , a 71 y.o. female  was evaluated in triage.  Pt complains of SOB x 4 days. Diagnosed with diastolic HF several months ago. Was taking torsemide but stopped after having muscle spasms. She was sent here by her cardiologist for possible fluid retention.   Review of Systems  Positive: SOB, fluid retention Negative: CP  Physical Exam  BP 127/76 (BP Location: Right Arm)   Pulse 74   Temp 98.1 F (36.7 C) (Oral)   Resp 19   SpO2 97%  Gen:   Awake, no distress   Resp:  Normal effort  MSK:   Moves extremities without difficulty  Other:    Medical Decision Making  Medically screening exam initiated at 3:44 PM.  Appropriate orders placed.  [name] was informed that the remainder of the evaluation will be completed by another provider, this initial triage assessment does not replace that evaluation, and the importance of remaining in the ED until their evaluation is complete.  Workup initiated. CXR without abnormalities   Blaine Guiffre T, PA-C 12/12/22 1544

## 2022-12-14 ENCOUNTER — Ambulatory Visit
Admission: RE | Admit: 2022-12-14 | Discharge: 2022-12-14 | Disposition: A | Discharge status: Home / Self Care | Payer: Medicare Other | Source: Ambulatory Visit | Attending: Nurse Practitioner | Admitting: Nurse Practitioner

## 2022-12-14 ENCOUNTER — Telehealth: Payer: Self-pay | Admitting: Cardiovascular Disease

## 2022-12-14 ENCOUNTER — Telehealth: Payer: Self-pay | Admitting: *Deleted

## 2022-12-14 ENCOUNTER — Encounter: Payer: Self-pay | Admitting: Nurse Practitioner

## 2022-12-14 ENCOUNTER — Ambulatory Visit: Payer: Medicare Other | Attending: Nurse Practitioner | Admitting: Nurse Practitioner

## 2022-12-14 ENCOUNTER — Other Ambulatory Visit: Payer: Self-pay | Admitting: Cardiovascular Disease

## 2022-12-14 VITALS — BP 124/78 | HR 85 | Ht 64.0 in | Wt 240.8 lb

## 2022-12-14 DIAGNOSIS — I10 Essential (primary) hypertension: Secondary | ICD-10-CM | POA: Diagnosis not present

## 2022-12-14 DIAGNOSIS — I5032 Chronic diastolic (congestive) heart failure: Secondary | ICD-10-CM

## 2022-12-14 DIAGNOSIS — I2609 Other pulmonary embolism with acute cor pulmonale: Secondary | ICD-10-CM

## 2022-12-14 DIAGNOSIS — Z0389 Encounter for observation for other suspected diseases and conditions ruled out: Secondary | ICD-10-CM | POA: Diagnosis not present

## 2022-12-14 DIAGNOSIS — R0603 Acute respiratory distress: Secondary | ICD-10-CM

## 2022-12-14 DIAGNOSIS — R0602 Shortness of breath: Secondary | ICD-10-CM

## 2022-12-14 LAB — BASIC METABOLIC PANEL
BUN/Creatinine Ratio: 22 (ref 12–28)
BUN: 22 mg/dL (ref 8–27)
CO2: 27 mmol/L (ref 20–29)
Calcium: 9.8 mg/dL (ref 8.7–10.3)
Chloride: 104 mmol/L (ref 96–106)
Creatinine, Ser: 1.01 mg/dL — ABNORMAL HIGH (ref 0.57–1.00)
Glucose: 103 mg/dL — ABNORMAL HIGH (ref 70–99)
Potassium: 4.7 mmol/L (ref 3.5–5.2)
Sodium: 140 mmol/L (ref 134–144)
eGFR: 60 mL/min/{1.73_m2} (ref >59)

## 2022-12-14 LAB — D-DIMER, QUANTITATIVE: D-DIMER: 0.42 mg/L FEU (ref 0.00–0.49)

## 2022-12-14 MED ORDER — POTASSIUM CHLORIDE ER 10 MEQ PO TBCR: 10.0000 meq | EXTENDED_RELEASE_TABLET | Freq: Every day | ORAL | 3 refills | Status: DC

## 2022-12-14 MED ORDER — FUROSEMIDE 20 MG PO TABS: 20.0000 mg | ORAL_TABLET | Freq: Every day | ORAL | 3 refills | Status: DC

## 2022-12-14 MED ORDER — IOHEXOL 350 MG/ML SOLN
75.0000 mL | Freq: Once | INTRAVENOUS | Status: AC | PRN
  Administered 2022-12-14: 75 mL via INTRAVENOUS

## 2022-12-14 NOTE — Telephone Encounter (Signed)
S/w pt per Christen Bame, NP to see if pt had any symptoms of COVID or RSV.  Pt stated does not have fever, chills, cough, congestion, nausea, runny or stuffy nose.  Pt stated would not come in if pt did not feel good.

## 2022-12-14 NOTE — Telephone Encounter (Signed)
Radiology called to state that the pt had a normal CT scan. No evidence of a PE.

## 2022-12-14 NOTE — Telephone Encounter (Signed)
Calling with CT results. Call transferred

## 2022-12-14 NOTE — Patient Instructions (Signed)
Medication Instructions:   START Lasix one (1) tablet by mouth ( 20 mg) daily.   START K-Dur one (1) tablet by mouth (10 mEq) daily.    *If you need a refill on your cardiac medications before your next appointment, please call your pharmacy*   Lab Work:  TODAY STAT!!  D DIMER,PRO BNP,BMET  If you have labs (blood work) drawn today and your tests are completely normal, you will receive your results only by: [place] (if you have MyChart) OR A paper copy in the mail If you have any lab test that is abnormal or we need to change your treatment, we will call you to review the results.   Testing/Procedures:  Non-Cardiac CT Angiography (CTA), is a special type of CT scan that uses a computer to produce multi-dimensional views of major blood vessels throughout the body. In CT angiography, a contrast material is injected through an IV to help visualize the blood vessels   Follow-Up: At [facility], you and your health needs are our priority.  As part of our continuing mission to provide you with exceptional heart care, we have created designated Provider Care Teams.  These Care Teams include your primary Cardiologist (physician) and Advanced Practice Providers (APPs -  Physician Assistants and Nurse Practitioners) who all work together to provide you with the care you need, when you need it.  We recommend signing up for the patient portal called "MyChart".  Sign up information is provided on this After Visit Summary.  MyChart is used to connect with patients for Virtual Visits (Telemedicine).  Patients are able to view lab/test results, encounter notes, upcoming appointments, etc.  Non-urgent messages can be sent to your provider as well.   To learn more about what you can do with MyChart, go to [email or web address].    Your next appointment:   2 week(s)  The format for your next appointment:   In Person  Provider:   Christen Bame, NP          Important  Information About Sugar

## 2022-12-15 ENCOUNTER — Other Ambulatory Visit: Payer: Self-pay | Admitting: Nurse Practitioner

## 2022-12-15 DIAGNOSIS — I5032 Chronic diastolic (congestive) heart failure: Secondary | ICD-10-CM

## 2022-12-15 DIAGNOSIS — R0602 Shortness of breath: Secondary | ICD-10-CM

## 2022-12-15 DIAGNOSIS — R0603 Acute respiratory distress: Secondary | ICD-10-CM

## 2022-12-15 LAB — PRO B NATRIURETIC PEPTIDE: NT-Pro BNP: 120 pg/mL (ref 0–301)

## 2022-12-17 ENCOUNTER — Emergency Department
Admission: EM | Admit: 2022-12-17 | Discharge: 2022-12-17 | Discharge status: Against Medical Advice | Payer: PPO | Attending: Emergency Medicine | Admitting: Emergency Medicine

## 2022-12-17 ENCOUNTER — Other Ambulatory Visit: Payer: Self-pay

## 2022-12-17 ENCOUNTER — Emergency Department: Payer: PPO

## 2022-12-17 ENCOUNTER — Ambulatory Visit: Admission: RE | Admit: 2022-12-17 | Payer: Medicare Other | Source: Ambulatory Visit

## 2022-12-17 DIAGNOSIS — Z5321 Procedure and treatment not carried out due to patient leaving prior to being seen by health care provider: Secondary | ICD-10-CM | POA: Diagnosis not present

## 2022-12-17 DIAGNOSIS — R0789 Other chest pain: Secondary | ICD-10-CM | POA: Diagnosis not present

## 2022-12-17 DIAGNOSIS — I5030 Unspecified diastolic (congestive) heart failure: Secondary | ICD-10-CM | POA: Insufficient documentation

## 2022-12-17 LAB — COMPREHENSIVE METABOLIC PANEL
ALT: 15 U/L (ref 0–44)
AST: 20 U/L (ref 15–41)
Albumin: 3.7 g/dL (ref 3.5–5.0)
Alkaline Phosphatase: 188 U/L — ABNORMAL HIGH (ref 38–126)
Anion gap: 9 (ref 5–15)
BUN: 15 mg/dL (ref 8–23)
CO2: 24 mmol/L (ref 22–32)
Calcium: 8.5 mg/dL — ABNORMAL LOW (ref 8.9–10.3)
Chloride: 107 mmol/L (ref 98–111)
Creatinine, Ser: 1.36 mg/dL — ABNORMAL HIGH (ref 0.44–1.00)
GFR, Estimated: 42 mL/min — ABNORMAL LOW (ref >60)
Glucose, Bld: 114 mg/dL — ABNORMAL HIGH (ref 70–99)
Potassium: 3.9 mmol/L (ref 3.5–5.1)
Sodium: 140 mmol/L (ref 135–145)
Total Bilirubin: 0.4 mg/dL (ref 0.3–1.2)
Total Protein: 7.1 g/dL (ref 6.5–8.1)

## 2022-12-17 LAB — CBC
HCT: 42.6 % (ref 36.0–46.0)
Hemoglobin: 13.4 g/dL (ref 12.0–15.0)
MCH: 25.9 pg — ABNORMAL LOW (ref 26.0–34.0)
MCHC: 31.5 g/dL (ref 30.0–36.0)
MCV: 82.2 fL (ref 80.0–100.0)
Platelets: 337 10*3/uL (ref 150–400)
RBC: 5.18 MIL/uL — ABNORMAL HIGH (ref 3.87–5.11)
RDW: 17.2 % — ABNORMAL HIGH (ref 11.5–15.5)
WBC: 11.2 10*3/uL — ABNORMAL HIGH (ref 4.0–10.5)
nRBC: 0 % (ref 0.0–0.2)

## 2022-12-17 LAB — TROPONIN I (HIGH SENSITIVITY): Troponin I (High Sensitivity): 8 ng/L (ref <18)

## 2022-12-17 NOTE — ED Triage Notes (Addendum)
Pt in from home via [facility] with 8/10 L chest pressure, onset while making dinner 2hrs ago. Pain radiates to L shoulder and L arm. Took 1 NTG and pain went to 3/10. Hx of LBBB, CHF, EF 55%, cardiologist Dr. Acie Fredrickson. Reports she has been having more frequent spells of tachycardia, sob and cp, ongoing since July. Recently placed on Lasix, but has not yet taken it. No incr weight gain. Given 434m NS and 324 ASA en route with EMS. Given 3LNC O2 en route for comfort, sats 97% on RA. CBG 157

## 2022-12-18 NOTE — Progress Notes (Unsigned)
Synopsis: Referred for dyspnea, increased WOB by Pleas Koch, NP  Subjective:   PATIENT ID: Denise [name] GENDER: female DOB: [birth year], MRN: [id number]  No chief complaint on file.  27yF with history of LBBB, diastolic CHF, HTN    Otherwise pertinent review of systems is negative.  Past Medical History:  Diagnosis Date   Allergy    seasonal   Arthritis    Bursitis of hip    Left   CHF (congestive heart failure) ([place])    Constipation    90%of the time c/o constipation, occ has diarrhea - uses stool softener    Endometriosis    Facial flushing 05/12/2014   Fibroid    GERD (gastroesophageal reflux disease)    Hematochezia    Hiatal hernia    HSV-1 (herpes simplex virus 1) infection    Hyperlipidemia    Hyperplastic colon polyp    Hypertension    Hypothyroidism    Ischemic colitis ([employer]) 01/2019   LBBB (left bundle branch block)    Low back pain    Migraine    Osteopenia 2008   -1.2 FEMORAL NECK   Overdose of benzodiazepine 08/11/2021   Prediabetes    Stroke [facility])    TIA   TIA (transient ischemic attack) 08/10/2013     Family History  Problem Relation Age of Onset   Cancer Mother        brain tumor   Heart disease Father    Parkinson's disease Sister    Breast cancer Maternal Aunt    Colon cancer Neg Hx    Colon polyps Neg Hx    Rectal cancer Neg Hx    Stomach cancer Neg Hx    Thyroid disease Neg Hx    Esophageal cancer Neg Hx    Pancreatic cancer Neg Hx      Past Surgical History:  Procedure Laterality Date   ABDOMINAL HYSTERECTOMY  1989   TAH, BSO   CHOLECYSTECTOMY  2003   COLONOSCOPY     KNEE SURGERY  2004   right   NASAL SEPTUM SURGERY  2012   PITUITARY SURGERY  2001   POLYPECTOMY     SHOULDER SURGERY  2007   right   THYROID LOBECTOMY  2002    Social History   Socioeconomic History   Marital status: Divorced    Spouse name: Not on file   Number of children: 2   Years of education: Not on file   Highest education  level: Not on file  Occupational History    Employer: RETIRED  Tobacco Use   Smoking status: Never   Smokeless tobacco: Never  Vaping Use   Vaping Use: Never used  Substance and Sexual Activity   Alcohol use: Yes    Alcohol/week: 0.0 standard drinks of alcohol    Comment: 1 every 3 months   Drug use: No   Sexual activity: Not Currently    Comment: intercourse age 64, more than 5 sexual partners,des neg  Other Topics Concern   Not on file  Social History Narrative   Single.   2 daughters, 4 grandchildren.   Once worked for [employer].   Enjoys Ambulance person.    Social Determinants of Health   Financial Resource Strain: Low Risk  (12/28/2021)   Overall Financial Resource Strain (CARDIA)    Difficulty of Paying Living Expenses: Not hard at all  Food Insecurity: No Food Insecurity (01/26/2019)   Hunger Vital Sign    Worried  About Running Out of Food in the Last Year: Never true    Ran Out of Food in the Last Year: Never true  Transportation Needs: No Transportation Needs (01/26/2019)   PRAPARE - Hydrologist (Medical): No    Lack of Transportation (Non-Medical): No  Physical Activity: Unknown (01/26/2019)   Exercise Vital Sign    Days of Exercise per Week: Patient refused    Minutes of Exercise per Session: Patient refused  Stress: No Stress Concern Present (01/26/2019)   [place]    Feeling of Stress : Not at all  Social Connections: Not on file  Intimate Partner Violence: Not on file     Allergies  Allergen Reactions   Tramadol Other (See Comments)    Reaction: NIGHTMARES Other reaction(s): Not available     Outpatient Medications Prior to Visit  Medication Sig Dispense Refill   aspirin 81 MG tablet Take 81 mg by mouth daily.     atorvastatin (LIPITOR) 10 MG tablet TAKE 1 TABLET BY MOUTH DAILY FOR CHOLESTEROL 90 tablet 2   buPROPion ER  (WELLBUTRIN SR) 100 MG 12 hr tablet TAKE 1 TABLET (100 MG TOTAL) BY MOUTH 2 (TWO) TIMES DAILY. FOR DEPRESSION 180 tablet 0   Cholecalciferol (D3) 10 MCG (400 UNIT) CHEW Chew by mouth.     DULoxetine (CYMBALTA) 30 MG capsule TAKE 1 CAPSULE BY MOUTH DAILY  FOR ANXIETY AND DEPRESSION TAKE  WITH 60 MG CAPSULE 90 capsule 3   DULoxetine (CYMBALTA) 60 MG capsule TAKE 1 CAPSULE BY MOUTH  DAILY FOR HEADACHE  PREVENTION 90 capsule 3   empagliflozin (JARDIANCE) 10 MG TABS tablet Take 1 tablet (10 mg total) by mouth daily before breakfast. 90 tablet 3   ferrous sulfate 325 (65 FE) MG EC tablet Take 1 tablet (325 mg total) by mouth daily with breakfast. 90 tablet 0   furosemide (LASIX) 20 MG tablet Take 1 tablet (20 mg total) by mouth daily. 90 tablet 3   lansoprazole (EQ LANSOPRAZOLE) 15 MG capsule Take 15 mg by mouth 2 (two) times daily.     levothyroxine (SYNTHROID) 75 MCG tablet TAKE 1 TABLET BY MOUTH IN THE  MORNING ON AN EMPTY STOMACH WITH WATER ONLY. NO FOOD OR OTHER  MEDICATIONS FOR 1/2 HOUR 90 tablet 2   magnesium 30 MG tablet Take 30 mg by mouth daily.     metoprolol succinate (TOPROL-XL) 50 MG 24 hr tablet Take 1 tablet (50 mg total) by mouth daily. Take with or immediately following a meal. 90 tablet 3   mirtazapine (REMERON) 15 MG tablet Take 1 tablet (15 mg total) by mouth at bedtime. For sleep 90 tablet 0   nitroGLYCERIN (NITROSTAT) 0.4 MG SL tablet Dissolve 1 tablet under the tongue every 5 minutes as needed for chest pain. Max of 3 doses, then 911. 25 tablet 6   potassium chloride (KLOR-CON) 10 MEQ tablet Take 1 tablet (10 mEq total) by mouth daily. 90 tablet 3   sacubitril-valsartan (ENTRESTO) 49-51 MG Take 1 tablet by mouth 2 (two) times daily. 60 tablet 11   spironolactone (ALDACTONE) 25 MG tablet Take 1 tablet (25 mg total) by mouth daily. 90 tablet 3   No facility-administered medications prior to visit.       Objective:   Physical Exam:  General appearance: 72 y.o., female, NAD,  conversant  Eyes: anicteric sclerae; PERRL, tracking appropriately HENT: NCAT; MMM Neck: Trachea midline; no lymphadenopathy, no JVD  Lungs: CTAB, no crackles, no wheeze, with normal respiratory effort CV: RRR, no murmur  Abdomen: Soft, non-tender; non-distended, BS present  Extremities: No peripheral edema, warm Skin: Normal turgor and texture; no rash Psych: Appropriate affect Neuro: Alert and oriented to person and place, no focal deficit     There were no vitals filed for this visit.   on *** LPM *** RA BMI Readings from Last 3 Encounters:  12/14/22 41.33 kg/m  12/06/22 41.06 kg/m  11/23/22 40.44 kg/m   Wt Readings from Last 3 Encounters:  12/14/22 240 lb 12.8 oz (109.2 kg)  12/06/22 239 lb 3.2 oz (108.5 kg)  11/23/22 235 lb 9.6 oz (106.9 kg)     CBC    Component Value Date/Time   WBC 11.2 (H) 12/17/2022 2127   RBC 5.18 (H) 12/17/2022 2127   HGB 13.4 12/17/2022 2127   HGB 12.9 09/04/2022 1634   HCT 42.6 12/17/2022 2127   HCT 41.2 09/04/2022 1634   PLT 337 12/17/2022 2127   PLT 363 09/04/2022 1634   MCV 82.2 12/17/2022 2127   MCV 78 (L) 09/04/2022 1634   MCH 25.9 (L) 12/17/2022 2127   MCHC 31.5 12/17/2022 2127   RDW 17.2 (H) 12/17/2022 2127   RDW 15.9 (H) 09/04/2022 1634   LYMPHSABS 2.2 06/12/2022 1311   MONOABS 0.9 06/12/2022 1311   EOSABS 0.4 06/12/2022 1311   BASOSABS 0.0 06/12/2022 1311    Eos 300-500  Chest Imaging: CTA Chest 12/14/22 reviewed by me unremarkable  Pulmonary Functions Testing Results:     No data to display           Echocardiogram 07/09/22:    1. Left ventricular ejection fraction, by estimation, is 55 to 60%. The  left ventricle has normal function. The left ventricle has no regional  wall motion abnormalities. Left ventricular diastolic parameters are  indeterminate.   2. Right ventricular systolic function is normal. The right ventricular  size is normal.   3. The mitral valve is normal in structure. No evidence of  mitral valve  regurgitation. No evidence of mitral stenosis.   4. The aortic valve is normal in structure. Aortic valve regurgitation is  not visualized. No aortic stenosis is present.   5. There is mild dilatation of the ascending aorta, measuring 37 mm.   6. The inferior vena cava is normal in size with greater than 50%  respiratory variability, suggesting right atrial pressure of 3 mmHg.   Heart Catheterization: ***    Assessment & Plan:    Plan:      Maryjane Hurter, MD [facility] 12/18/2022 4:58 PM

## 2022-12-19 ENCOUNTER — Ambulatory Visit (INDEPENDENT_AMBULATORY_CARE_PROVIDER_SITE_OTHER): Payer: PPO | Admitting: Student

## 2022-12-19 ENCOUNTER — Encounter: Payer: Self-pay | Admitting: Student

## 2022-12-19 VITALS — BP 128/86 | HR 89 | Temp 97.8°F | Ht 64.5 in | Wt 243.0 lb

## 2022-12-19 DIAGNOSIS — R0600 Dyspnea, unspecified: Secondary | ICD-10-CM

## 2022-12-19 DIAGNOSIS — R053 Chronic cough: Secondary | ICD-10-CM

## 2022-12-19 MED ORDER — ALBUTEROL SULFATE HFA 108 (90 BASE) MCG/ACT IN AERS: 2.0000 | INHALATION_SPRAY | Freq: Four times a day (QID) | RESPIRATORY_TRACT | 6 refills | Status: DC | PRN

## 2022-12-19 NOTE — Patient Instructions (Addendum)
-   try albuterol 1-2 puffs up to every 6 hours as needed and 5-20 minutes before any heavy exertion - we will schedule PFTs  - continue what you're doing for reflux - would recommend getting back on flonase 1 spray each nostril after clearing your nose of crusting following a shower everyday for at least 8 weeks. Plus or minus a nonsedating antihistamine - will set up follow up appointment in 4 weeks but need to postpone it if PFTs not done beforehand

## 2022-12-20 ENCOUNTER — Telehealth: Payer: Self-pay | Admitting: *Deleted

## 2022-12-20 NOTE — Telephone Encounter (Signed)
        Patient  visited [facility] on 12/17/2022  for chest pain    Telephone encounter attempt :  1st  A HIPAA compliant voice message was left requesting a return call.  Instructed patient to call back at [phone]. [place] [phone] [address] , [place] [address] Email : [email or web address]

## 2022-12-23 NOTE — Progress Notes (Unsigned)
Cardiology Office Note:    Date:  12/25/2022   ID:  Denise [name], DOB July 05, 1951, MRN [id number]  PCP:  Pleas Koch, NP   [facility] Providers Cardiologist:  Mertie Moores, MD     Referring MD: Pleas Koch, NP   Chief Complaint: shortness of breath, heart racing  History of Present Illness:    Denise [name] is a very pleasant 72 y.o. female with a hx of LBBB, family history CAD (father MI mid 57s, grandmother stroke), chronic HFpEF, coronary CTA 08/2022 coronary calcium score 0, normal coronary arteries, obesity, iron deficiency anemia, and HTN  Referred to cardiology and seen by Dr. Acie Fredrickson on 07/16/2022 for shortness of breath.  Echocardiogram 07/09/2022 reveals normal LVEF 55 to 60%, no significant valve disease, trivial TR, indeterminate diastolic parameters.  Ports she runs out of breath while walking and while getting dressed.  Feels like she is unable to take a deep breath.  Leg edema gradually worsening, took Lasix for 5 days but does not think it helped.  Has 2 pillow orthopnea worsening over the past few months.  25 lb weight gain over the previous year, no regular exercise.  Does not add much salt and recently had been trying to avoid processed food. CT showed groundglass appearance.  She was felt to have acute on chronic diastolic congestive heart failure and was started on torsemide 20 mg daily, K-Dur 10 milliequivalents daily, and Entresto 49-51 twice daily. Amlodipine was discontinued and Jardiance 10 mg daily was started.  At follow-up visit 1 week later, weight down 6 lbs, she was feeling better but also feeling washed out. No longer having orthopnea. Advised to start spironolactone 25 mg daily and to decrease torsemide and potassium to 3 days weekly.  Office visit 09/04/22, was planned for GI procedures but they were canceled due to chest pain. Coronary calcium score of 0 on CCTA 08/27/2022. Was started on isosorbide but stopped due to severe  headache.   Cardiology clinic visit was 10/08/2022 with Dr. Acie Fredrickson. Had to stop Entresto due to cost, started on valsartan. Felt better on Entresto and is willing to restart if price is affordable. Her spironolactone which had been inadvertently stopped was restarted. Advised to return in 3 months for follow-up.  Seen by me on 11/23/22 for increased shortness of breath and tachycardia. Felt like she did when she initially came in to see Dr. Acie Fredrickson July 2023. Not able to walk as far without SOB over the past 2 weeks. Sleeping on 3 pillows currently. Return of PND which had initially improved on [place].  No edema. Additionally, two year problem with severe diaphoresis with any exertion, and sometimes at rest. Does not feel flushed when this occurs but it is accompanied by chest pain and SOB. Symptoms do not occur at a certain time of day and started prior to starting cardiac medications.  Also nasal congestion and cough, which preceded starting ARNI/ARB. Took torsemide 5 days in a row with some but no marked improvement. Now has cramping in back, hands, and legs. No presyncope, syncope. Felt better while taking Entresto but is not able to afford it. Is hoping insurance will cover it better in January. BNP was normal. Creatinine was elevated at 1.41 but she had recently increased her torsemide for 5 days. TSH was checked due to c/o profuse sweating. It was elevated at 8.730 for which we asked for assistance from PCP.   I saw her on 12/06/22 for follow-up and  she reported feeling better. Thinks the medications are making her feel better. Is going to apply for patient assistance, per her best estimate she will hit the donut hole in August. Felt dizzy walking in today and feels that her balance is affected. No presyncope, syncope. Activity remains limited by shortness of breath but she is motivated to increase exercise.  Admits she has not been very active. No orthopnea, edema, or PND. No chest pain  or palpitations. Has gained 100 lbs since she retired. She is very emotional about this.  She contacted our office 12/11/22 to report increased shortness of breath. She had stopped her torsemide.  She was subsequently scheduled for office visit and seen by me on 12/14/2022. Reported she began to feel poorly on 12/23 and has had difficulty completing activities since that time. Worst day was 12/10/22, trying to cook, wash dishes, and prepare to go to her daughter's house but her heart rate would speed up and she would have to sit and rest. Could only complete a few minutes of activities without resting. On one occasion during these symptoms BP was 93/72, HR was 101.  She feels like she cannot take a deep breath and on occasion gets very lightheaded when breathing. Stopped the torsemide because of pain in left side under left breast that woke her up during the night. The pain stopped when she stopped torsemide. Mild chest pressure at times with the shortness of breath. No edema, orthopnea, PND, presyncope or syncope. Continues to avoid sodium and limits fluids to < 2L. Reports this was a significant step back and how she was feeling when she saw me on 12/21. Went to ED on 12/27 and had EKG and CXR which were unremarkable. Waited 7 hours and then went home. No recent symptoms of cold or respiratory illness, no fever or chills.  I sent her for chest CTA to rule out PE and it was negative.  Referral placed to pulmonology and she was seen by Dr. Verlee Monte on 12/19/22.  Plan for PFTs and to use albuterol 1 to 2 puffs every 6 hours as needed and 5 to 20 minutes before heavy exertion.  Today, she is here alone for follow-up. She became very tearful walking in because she had to stop to rest. She was embarrassed by this but was taken to a private exam room and recovered with rest and privacy. We reviewed previous tests results and her worsening tachycardia and dyspnea on exertion. She has a slightly prolonged QT. Started  duloxetine many years ago for migraines and has been on wellbutrin approximately one year. Gets lightheaded on occasion with exertion, but no presyncope, syncope. Current symptoms of DOE and tachycardia initially started in November - she had good days and bad days. Reviewed history of COVID infection and injections - infection summer 2022, had initial 2 vaccines and 3 boosters. Did not have severe symptoms with infection. Has monitored her HR with rest and with activity, HR 80-115 bpm, just with walking or taking a shower. On one occasion HR was 140 bpm.  She called EMS. Took ntg but did not have significant improvement. HR improved with oxygen. She endorses some chest and left arm discomfort. Feels better at rest.   Past Medical History:  Diagnosis Date   Allergy    seasonal   Arthritis    Bursitis of hip    Left   CHF (congestive heart failure) (HCC)    Constipation    90%of the time c/o constipation, occ has  diarrhea - uses stool softener    Endometriosis    Facial flushing 05/12/2014   Fibroid    GERD (gastroesophageal reflux disease)    Hematochezia    Hiatal hernia    HSV-1 (herpes simplex virus 1) infection    Hyperlipidemia    Hyperplastic colon polyp    Hypertension    Hypothyroidism    Ischemic colitis ([place]) 01/2019   LBBB (left bundle branch block)    Low back pain    Migraine    Osteopenia 2008   -1.2 FEMORAL NECK   Overdose of benzodiazepine 08/11/2021   Prediabetes    Stroke [facility])    TIA   TIA (transient ischemic attack) 08/10/2013    Past Surgical History:  Procedure Laterality Date   ABDOMINAL HYSTERECTOMY  1989   TAH, BSO   CHOLECYSTECTOMY  2003   COLONOSCOPY     KNEE SURGERY  2004   right   NASAL SEPTUM SURGERY  2012   PITUITARY SURGERY  2001   POLYPECTOMY     SHOULDER SURGERY  2007   right   THYROID LOBECTOMY  2002    Current Medications: Current Meds  Medication Sig   albuterol (VENTOLIN HFA) 108 (90 Base) MCG/ACT inhaler Inhale 2 puffs into  the lungs every 6 (six) hours as needed for wheezing or shortness of breath.   aspirin 81 MG tablet Take 81 mg by mouth daily.   atorvastatin (LIPITOR) 10 MG tablet TAKE 1 TABLET BY MOUTH DAILY FOR CHOLESTEROL   buPROPion ER (WELLBUTRIN SR) 100 MG 12 hr tablet TAKE 1 TABLET (100 MG TOTAL) BY MOUTH 2 (TWO) TIMES DAILY. FOR DEPRESSION   Cholecalciferol (D3) 10 MCG (400 UNIT) CHEW Chew by mouth.   DULoxetine (CYMBALTA) 30 MG capsule TAKE 1 CAPSULE BY MOUTH DAILY  FOR ANXIETY AND DEPRESSION TAKE  WITH 60 MG CAPSULE   DULoxetine (CYMBALTA) 60 MG capsule TAKE 1 CAPSULE BY MOUTH  DAILY FOR HEADACHE  PREVENTION   empagliflozin (JARDIANCE) 10 MG TABS tablet Take 1 tablet (10 mg total) by mouth daily before breakfast.   ferrous sulfate 325 (65 FE) MG EC tablet Take 1 tablet (325 mg total) by mouth daily with breakfast.   KLOR-CON M10 10 MEQ tablet Take 10 mEq by mouth as needed. With lasix.   lansoprazole (EQ LANSOPRAZOLE) 15 MG capsule Take 15 mg by mouth 2 (two) times daily.   levothyroxine (SYNTHROID) 75 MCG tablet TAKE 1 TABLET BY MOUTH IN THE  MORNING ON AN EMPTY STOMACH WITH WATER ONLY. NO FOOD OR OTHER  MEDICATIONS FOR 1/2 HOUR   magnesium 30 MG tablet Take 30 mg by mouth daily.   metoprolol succinate (TOPROL-XL) 50 MG 24 hr tablet Take 1 tablet (50 mg total) by mouth daily. Take with or immediately following a meal.   metoprolol tartrate (LOPRESSOR) 25 MG tablet Take one (1) tablet by mouth ( 25 mg) Heart Rate greater than 100 or palpitations.   mirtazapine (REMERON) 15 MG tablet Take 1 tablet (15 mg total) by mouth at bedtime. For sleep   nitroGLYCERIN (NITROSTAT) 0.4 MG SL tablet Dissolve 1 tablet under the tongue every 5 minutes as needed for chest pain. Max of 3 doses, then 911.   sacubitril-valsartan (ENTRESTO) 49-51 MG Take 1 tablet by mouth 2 (two) times daily.   spironolactone (ALDACTONE) 25 MG tablet Take 1 tablet (25 mg total) by mouth daily.   tizanidine (ZANAFLEX) 2 MG capsule Take 2 mg  by mouth 2 (two) times daily as  needed for muscle spasms.     Allergies:   Tramadol   Social History   Socioeconomic History   Marital status: Divorced    Spouse name: Not on file   Number of children: 2   Years of education: Not on file   Highest education level: Not on file  Occupational History    Employer: RETIRED  Tobacco Use   Smoking status: Never    Passive exposure: Past   Smokeless tobacco: Never  Vaping Use   Vaping Use: Never used  Substance and Sexual Activity   Alcohol use: Yes    Alcohol/week: 0.0 standard drinks of alcohol    Comment: 1 every 3 months   Drug use: No   Sexual activity: Not Currently    Comment: intercourse age 24, more than 5 sexual partners,des neg  Other Topics Concern   Not on file  Social History Narrative   Single.   2 daughters, 4 grandchildren.   Once worked for [employer].   Enjoys Ambulance person.    Social Determinants of Health   Financial Resource Strain: Low Risk  (12/28/2021)   Overall Financial Resource Strain (CARDIA)    Difficulty of Paying Living Expenses: Not hard at all  Food Insecurity: No Food Insecurity (01/26/2019)   Hunger Vital Sign    Worried About Running Out of Food in the Last Year: Never true    Ran Out of Food in the Last Year: Never true  Transportation Needs: No Transportation Needs (01/26/2019)   PRAPARE - Hydrologist (Medical): No    Lack of Transportation (Non-Medical): No  Physical Activity: Unknown (01/26/2019)   Exercise Vital Sign    Days of Exercise per Week: Patient refused    Minutes of Exercise per Session: Patient refused  Stress: No Stress Concern Present (01/26/2019)   [place]    Feeling of Stress : Not at all  Social Connections: Not on file     Family History: The patient's family history includes Breast cancer in her maternal aunt; Cancer in her mother;  Heart disease in her father; Parkinson's disease in her sister. There is no history of Colon cancer, Colon polyps, Rectal cancer, Stomach cancer, Thyroid disease, Esophageal cancer, or Pancreatic cancer.  ROS:   Please see the history of present illness.  + shortness of breath + tachycardia All other systems reviewed and are negative.  Labs/Other Studies Reviewed:    The following studies were reviewed today:  CTA Chest PE 12/14/22 IMPRESSION: 1. No acute pulmonary embolism. 2. No acute pulmonary parenchymal findings.    Echo 07/09/22 1. Left ventricular ejection fraction, by estimation, is 55 to 60%. The  left ventricle has normal function. The left ventricle has no regional  wall motion abnormalities. Left ventricular diastolic parameters are  indeterminate.   2. Right ventricular systolic function is normal. The right ventricular  size is normal.   3. The mitral valve is normal in structure. No evidence of mitral valve  regurgitation. No evidence of mitral stenosis.   4. The aortic valve is normal in structure. Aortic valve regurgitation is  not visualized. No aortic stenosis is present.   5. There is mild dilatation of the ascending aorta, measuring 37 mm.   6. The inferior vena cava is normal in size with greater than 50%  respiratory variability, suggesting right atrial pressure of 3 mmHg.   CCTA 09/06/22 1. No evidence of  CAD, 0% stenosis, CADRADS 0.   2. Coronary calcium score of 0.   3. Normal coronary origins with right dominance.   RECOMMENDATIONS: CAD-RADS 0. No evidence of CAD (0%). Consider non-atherosclerotic causes of chest pain.  Recent Labs: 07/19/2022: Magnesium 2.2 11/23/2022: TSH 8.730 12/14/2022: NT-Pro BNP 120 12/17/2022: ALT 15; BUN 15; Creatinine, Ser 1.36; Platelets 337; Potassium 3.9; Sodium 140 12/25/2022: Hemoglobin 15.1  Recent Lipid Panel    Component Value Date/Time   CHOL 110 07/12/2022 1236   TRIG 163.0 (H) 07/12/2022 1236   HDL 42.60  07/12/2022 1236   CHOLHDL 3 07/12/2022 1236   VLDL 32.6 07/12/2022 1236   LDLCALC 34 07/12/2022 1236   LDLDIRECT 72.0 06/23/2021 1245     Risk Assessment/Calculations:       Physical Exam:    VS:  BP (!) 118/90 (BP Location: Left Arm, Patient Position: Sitting, Cuff Size: Large)   Pulse (!) 101   Ht 5' 4.5" (1.638 m)   Wt 245 lb (111.1 kg)   SpO2 95%   BMI 41.40 kg/m     Wt Readings from Last 3 Encounters:  12/25/22 245 lb (111.1 kg)  12/19/22 243 lb (110.2 kg)  12/14/22 240 lb 12.8 oz (109.2 kg)     GEN:  Well nourished, well developed in no acute distress HEENT: Normal NECK: No JVD; No carotid bruits CARDIAC: RRR, no murmurs, rubs, gallops RESPIRATORY:  Increased WOB. Clear to auscultation without rales, wheezing or rhonchi  ABDOMEN: Soft, non-tender, non-distended MUSCULOSKELETAL:  No edema; No deformity. 2+ pedal pulses, equal bilaterally SKIN: Warm and dry NEUROLOGIC:  Alert and oriented x 3 PSYCHIATRIC:  Normal affect   EKG:  EKG is ordered today.  EKG reveals sinus tachycardia at 101 bpm, LAD, LBBB, no acute change from previous  Diagnoses:    1. Chronic diastolic CHF (congestive heart failure) ([place])   2. LBBB (left bundle branch block)   3. Essential hypertension   4. Shortness of breath   5. Tachycardia   6. Precordial pain      Assessment and Plan:     Shortness of breath: Increased shortness of breath and increased HR with minimal exertion. Becomes lightheaded at times with exertion. No evidence of significant volume overload on exam. CTA 12/29 negative for evaluation of PE. BNP 12/29 was normal. Seen by pulmonology, using albuterol inhaler twice daily - no significant improvement. Is scheduled for PFTs which I encouraged her to complete.  We will get cardiac MRI for suspicion of myocarditis. Will get a 7 day Zio monitor to evaluate for arrhythmia that may be contributing. Will get CBC today in preparation for MRI.  Sinus tachycardia: Resting HR 100  bpm today. She has been monitoring at home and has increased HR up to 140 with minimal exertion. Will place 7-day ZIO for evaluation of arrhythmia. Will add metoprolol tartrate 25 mg up to 2 times daily for HR > 100 bpm to see if lowering HR will improve her symptoms of DOE.   Chest pain: Has chest and left arm discomfort associated with DOE and tachycardia. Negative troponin on 12/17/2022. Calcium score of 0 on CCTA 08/28/22. I feel discomfort is likely 2/2 increased heart rate and shortness of breath.  There is likely also a component of anxiety 2/2 complexity of current issues and no clear explanation.   Chronic HFpEF: LVEF 55-60%, indeterminate diastolic parameters on echo 07/09/22. Has been feeling better on GDMT until 10/2022 when she developed significantly worse SOB. No orthopnea, PND, edema. BNP  normal on 12/14/22.Tolerating daily Lasix without problem. No problems with GDMT including Jardiance, metoprolol, Entresto, and spironolactone. Scr and electrolytes remain stable. Continue low sodium diet.   LBBB: Known. LVEF normal on echo 06/2022. Will continue to monitor.   Hypertension: BP is well-controlled. She brought her home cuff which correlates with ours.     Disposition: 1 month with Dr. Acie Fredrickson  Medication Adjustments/Labs and Tests Ordered: Current medicines are reviewed at length with the patient today.  Concerns regarding medicines are outlined above.  Orders Placed This Encounter  Procedures   MR CARDIAC MORPHOLOGY WO CONTRAST   Hemoglobin and hematocrit, blood   LONG TERM MONITOR (3-14 DAYS)   Meds ordered this encounter  Medications   metoprolol tartrate (LOPRESSOR) 25 MG tablet    Sig: Take one (1) tablet by mouth ( 25 mg) Heart Rate greater than 100 or palpitations.    Dispense:  30 tablet    Refill:  6    Patient Instructions  Medication Instructions:   START Lopressor one (1) tablet by mouth ( 25 mg) as needed for Heart Rate greater than 100 or plapitations.   *If  you need a refill on your cardiac medications before your next appointment, please call your pharmacy*   Lab Work:  TODAY!!!  CBC   If you have labs (blood work) drawn today and your tests are completely normal, you will receive your results only by: [place] (if you have MyChart) OR A paper copy in the mail If you have any lab test that is abnormal or we need to change your treatment, we will call you to review the results.   Testing/Procedures:    You are scheduled for Cardiac MRI on ______________. Please arrive for your appointment at ______________ ( arrive 30-45 minutes prior to test start time). ?  [facility] [address] [place], [place] [address] [phone] Please take advantage of the free valet parking available at the MAIN entrance. Proceed to [facility] registration for check-in (first floor).  Magnetic resonance imaging (MRI) is a painless test that produces images of the inside of the body without using Xrays.  During an MRI, strong magnets and radio waves work together in a Research officer, political party to form detailed images.   MRI images may provide more details about a medical condition than X-rays, CT scans, and ultrasounds can provide.  You may be given earphones to listen for instructions.  You may eat a light breakfast and take medications as ordered with the exception of HCTZ (fluid pill, other). Please avoid stimulants for 12 hr prior to test. (Ie. Caffeine, nicotine, chocolate, or antihistamine medications)  If a contrast material will be used, an IV will be inserted into one of your veins. Contrast material will be injected into your IV. It will leave your body through your urine within a day. You may be told to drink plenty of fluids to help flush the contrast material out of your system.  You will be asked to remove all metal, including: Watch, jewelry, and other metal objects including hearing aids, hair pieces and dentures. Also  wearable glucose monitoring systems (ie. Freestyle Libre and Omnipods) (Braces and fillings normally are not a problem.)   TEST WILL TAKE APPROXIMATELY 1 HOUR  PLEASE NOTIFY SCHEDULING AT LEAST 24 HOURS IN ADVANCE IF YOU ARE UNABLE TO KEEP YOUR APPOINTMENT. [phone]  Please call Marchia Bond, cardiac imaging nurse navigator with any questions/concerns. Marchia Bond RN Navigator Cardiac Imaging Gordy Clement RN Navigator  Cardiac Imaging [facility] [phone] Office   [place] Monitor Instructions  Your physician has requested you wear a ZIO patch monitor for 7 days.  This is a single patch monitor. [employer] supplies one patch monitor per enrollment. Additional stickers are not available. Please do not apply patch if you will be having a Nuclear Stress Test,  Echocardiogram, Cardiac CT, MRI, or Chest Xray during the period you would be wearing the  monitor. The patch cannot be worn during these tests. You cannot remove and re-apply the  ZIO XT patch monitor.  Your ZIO patch monitor will be mailed 3 day USPS to your address on file. It may take 3-5 days  to receive your monitor after you have been enrolled.  Once you have received your monitor, please review the enclosed instructions. Your monitor  has already been registered assigning a specific monitor serial # to you.  Billing and Patient Assistance Program Information  We have supplied [employer] with any of your insurance information on file for billing purposes. [employer] offers a sliding scale Patient Assistance Program for patients that do not have  insurance, or whose insurance does not completely cover the cost of the ZIO monitor.  You must apply for the Patient Assistance Program to qualify for this discounted rate.  To apply, please call [employer] at [phone], select option 4, select option 2, ask to apply for  Patient Assistance Program. Theodore Demark will ask your household income, and  how many people  are in your household. They will quote your out-of-pocket cost based on that information.  [employer] will also be able to set up a 39-month interest-free payment plan if needed.  Applying the monitor   Shave hair from upper left chest.  Hold abrader disc by orange tab. Rub abrader in 40 strokes over the upper left chest as  indicated in your monitor instructions.  Clean area with 4 enclosed alcohol pads. Let dry.  Apply patch as indicated in monitor instructions. Patch will be placed under collarbone on left  side of chest with arrow pointing upward.  Rub patch adhesive wings for 2 minutes. Remove white label marked "1". Remove the white  label marked "2". Rub patch adhesive wings for 2 additional minutes.  While looking in a mirror, press and release button in center of patch. A small green light will  flash 3-4 times. This will be your only indicator that the monitor has been turned on.  Do not shower for the first 24 hours. You may shower after the first 24 hours.  Press the button if you feel a symptom. You will hear a small click. Record Date, Time and  Symptom in the Patient Logbook.  When you are ready to remove the patch, follow instructions on the last 2 pages of Patient  Logbook. Stick patch monitor onto the last page of Patient Logbook.  Place Patient Logbook in the blue and white box. Use locking tab on box and tape box closed  securely. The blue and white box has prepaid postage on it. Please place it in the mailbox as  soon as possible. Your physician should have your test results approximately 7 days after the  monitor has been mailed back to [facility]  Call [place] [phone] you have questions regarding  your ZIO XT patch monitor. Call them immediately if you see an orange light blinking on your  monitor.  If your monitor falls off in less  than 4 days, contact our Monitor department at [phone].  If your monitor  becomes loose or falls off after 4 days call [employer] at [phone] for  suggestions on securing your monitor   Follow-Up: At [facility], you and your health needs are our priority.  As part of our continuing mission to provide you with exceptional heart care, we have created designated Provider Care Teams.  These Care Teams include your primary Cardiologist (physician) and Advanced Practice Providers (APPs -  Physician Assistants and Nurse Practitioners) who all work together to provide you with the care you need, when you need it.  We recommend signing up for the patient portal called "MyChart".  Sign up information is provided on this After Visit Summary.  MyChart is used to connect with patients for Virtual Visits (Telemedicine).  Patients are able to view lab/test results, encounter notes, upcoming appointments, etc.  Non-urgent messages can be sent to your provider as well.   To learn more about what you can do with MyChart, go to [email or web address].    Your next appointment:   1 month(s)  The format for your next appointment:   In Person  Provider:   Mertie Moores, MD     Important Information About Sugar         Signed, Emmaline Life, NP  12/25/2022 5:32 PM    [place]

## 2022-12-25 ENCOUNTER — Telehealth: Payer: Self-pay | Admitting: *Deleted

## 2022-12-25 ENCOUNTER — Ambulatory Visit: Payer: PPO | Attending: Nurse Practitioner | Admitting: Nurse Practitioner

## 2022-12-25 ENCOUNTER — Ambulatory Visit (INDEPENDENT_AMBULATORY_CARE_PROVIDER_SITE_OTHER): Payer: PPO

## 2022-12-25 ENCOUNTER — Encounter: Payer: Self-pay | Admitting: Nurse Practitioner

## 2022-12-25 VITALS — BP 118/90 | HR 101 | Ht 64.5 in | Wt 245.0 lb

## 2022-12-25 DIAGNOSIS — I10 Essential (primary) hypertension: Secondary | ICD-10-CM | POA: Diagnosis not present

## 2022-12-25 DIAGNOSIS — R0602 Shortness of breath: Secondary | ICD-10-CM

## 2022-12-25 DIAGNOSIS — I5032 Chronic diastolic (congestive) heart failure: Secondary | ICD-10-CM

## 2022-12-25 DIAGNOSIS — R072 Precordial pain: Secondary | ICD-10-CM | POA: Diagnosis not present

## 2022-12-25 DIAGNOSIS — R000 Tachycardia, unspecified: Secondary | ICD-10-CM | POA: Diagnosis not present

## 2022-12-25 DIAGNOSIS — I447 Left bundle-branch block, unspecified: Secondary | ICD-10-CM

## 2022-12-25 LAB — HEMOGLOBIN AND HEMATOCRIT, BLOOD
Hematocrit: 45.5 % (ref 34.0–46.6)
Hemoglobin: 15.1 g/dL (ref 11.1–15.9)

## 2022-12-25 MED ORDER — METOPROLOL TARTRATE 25 MG PO TABS: ORAL_TABLET | ORAL | 6 refills | Status: AC

## 2022-12-25 NOTE — Patient Instructions (Signed)
Medication Instructions:   START Lopressor one (1) tablet by mouth ( 25 mg) as needed for Heart Rate greater than 100 or plapitations.   *If you need a refill on your cardiac medications before your next appointment, please call your pharmacy*   Lab Work:  TODAY!!!  CBC   If you have labs (blood work) drawn today and your tests are completely normal, you will receive your results only by: [place] (if you have MyChart) OR A paper copy in the mail If you have any lab test that is abnormal or we need to change your treatment, we will call you to review the results.   Testing/Procedures:    You are scheduled for Cardiac MRI on ______________. Please arrive for your appointment at ______________ ( arrive 30-45 minutes prior to test start time). ?  [facility] [place], [place] [address] [phone] Please take advantage of the free valet parking available at the MAIN entrance. Proceed to [facility] registration for check-in (first floor).  Magnetic resonance imaging (MRI) is a painless test that produces images of the inside of the body without using Xrays.  During an MRI, strong magnets and radio waves work together in a Research officer, political party to form detailed images.   MRI images may provide more details about a medical condition than X-rays, CT scans, and ultrasounds can provide.  You may be given earphones to listen for instructions.  You may eat a light breakfast and take medications as ordered with the exception of HCTZ (fluid pill, other). Please avoid stimulants for 12 hr prior to test. (Ie. Caffeine, nicotine, chocolate, or antihistamine medications)  If a contrast material will be used, an IV will be inserted into one of your veins. Contrast material will be injected into your IV. It will leave your body through your urine within a day. You may be told to drink plenty of fluids to help flush the contrast material out of your  system.  You will be asked to remove all metal, including: Watch, jewelry, and other metal objects including hearing aids, hair pieces and dentures. Also wearable glucose monitoring systems (ie. Freestyle Libre and Omnipods) (Braces and fillings normally are not a problem.)   TEST WILL TAKE APPROXIMATELY 1 HOUR  PLEASE NOTIFY SCHEDULING AT LEAST 24 HOURS IN ADVANCE IF YOU ARE UNABLE TO KEEP YOUR APPOINTMENT. [phone]  Please call Marchia Bond, cardiac imaging nurse navigator with any questions/concerns. Marchia Bond RN Navigator Cardiac Imaging Gordy Clement RN Navigator Cardiac Imaging [facility] [phone] Office   ZIO XT- Long Term Monitor Instructions  Your physician has requested you wear a ZIO patch monitor for 7 days.  This is a single patch monitor. [employer] supplies one patch monitor per enrollment. Additional stickers are not available. Please do not apply patch if you will be having a Nuclear Stress Test,  Echocardiogram, Cardiac CT, MRI, or Chest Xray during the period you would be wearing the  monitor. The patch cannot be worn during these tests. You cannot remove and re-apply the  ZIO XT patch monitor.  Your ZIO patch monitor will be mailed 3 day USPS to your address on file. It may take 3-5 days  to receive your monitor after you have been enrolled.  Once you have received your monitor, please review the enclosed instructions. Your monitor  has already been registered assigning a specific monitor serial # to you.  Billing and Patient Assistance Program Information  We have  supplied [employer] with any of your insurance information on file for billing purposes. [employer] offers a sliding scale Patient Assistance Program for patients that do not have  insurance, or whose insurance does not completely cover the cost of the ZIO monitor.  You must apply for the Patient Assistance Program to qualify for this discounted rate.  To apply, please  call [employer] at [phone], select option 4, select option 2, ask to apply for  Patient Assistance Program. Theodore Demark will ask your household income, and how many people  are in your household. They will quote your out-of-pocket cost based on that information.  [employer] will also be able to set up a 29-month interest-free payment plan if needed.  Applying the monitor   Shave hair from upper left chest.  Hold abrader disc by orange tab. Rub abrader in 40 strokes over the upper left chest as  indicated in your monitor instructions.  Clean area with 4 enclosed alcohol pads. Let dry.  Apply patch as indicated in monitor instructions. Patch will be placed under collarbone on left  side of chest with arrow pointing upward.  Rub patch adhesive wings for 2 minutes. Remove white label marked "1". Remove the white  label marked "2". Rub patch adhesive wings for 2 additional minutes.  While looking in a mirror, press and release button in center of patch. A small green light will  flash 3-4 times. This will be your only indicator that the monitor has been turned on.  Do not shower for the first 24 hours. You may shower after the first 24 hours.  Press the button if you feel a symptom. You will hear a small click. Record Date, Time and  Symptom in the Patient Logbook.  When you are ready to remove the patch, follow instructions on the last 2 pages of Patient  Logbook. Stick patch monitor onto the last page of Patient Logbook.  Place Patient Logbook in the blue and white box. Use locking tab on box and tape box closed  securely. The blue and white box has prepaid postage on it. Please place it in the mailbox as  soon as possible. Your physician should have your test results approximately 7 days after the  monitor has been [facility]  Call I[place]at [phone] you have questions regarding  your ZIO XT patch monitor. Call them immediately if you see an  orange light blinking on your  monitor.  If your monitor falls off in less than 4 days, contact our Monitor department at [phone]  If your monitor becomes loose or falls off after 4 days call [employer] at [phone]  suggestions on securing your monitor   Follow-Up: [facility] and your health needs are our priority.  As part of our continuing mission to provide you with exceptional heart care, we have created designated Provider Care Teams.  These Care Teams include your primary Cardiologist (physician) and Advanced Practice Providers (APPs -  Physician Assistants and Nurse Practitioners) who all work together to provide you with the care you need, when you need it.  We recommend signing up for the patient portal called "MyChart".  Sign up information is provided on this After Visit Summary.  MyChart is used to connect with patients for Virtual Visits (Telemedicine).  Patients are able to view lab/test results, encounter notes, upcoming appointments, etc.  Non-urgent messages can be sent to your provider as well.   To learn more about  what you can do with MyChart, go to [email or web address].    Your next appointment:   1 month(s)  The format for your next appointment:   In Person  Provider:   Mertie Moores, MD     Important Information About Sugar

## 2022-12-25 NOTE — Telephone Encounter (Signed)
       Patient visited [facility] on 12/17/2022 for chest pain     Telephone encounter attempt :  2nd  A HIPAA compliant voice message was left requesting a return call.  Instructed patient to call back at [phone].  [place] [phone] [address] , [place] [address] Email : [email or web address]

## 2022-12-25 NOTE — Progress Notes (Unsigned)
Enrolled patient for a 7 day Zio XT monitor to be mailed to patients home  Dr Acie Fredrickson to read

## 2022-12-27 ENCOUNTER — Other Ambulatory Visit: Payer: Self-pay | Admitting: Primary Care

## 2022-12-27 DIAGNOSIS — G479 Sleep disorder, unspecified: Secondary | ICD-10-CM

## 2022-12-28 DIAGNOSIS — I10 Essential (primary) hypertension: Secondary | ICD-10-CM | POA: Diagnosis not present

## 2022-12-28 DIAGNOSIS — R000 Tachycardia, unspecified: Secondary | ICD-10-CM | POA: Diagnosis not present

## 2022-12-28 DIAGNOSIS — I447 Left bundle-branch block, unspecified: Secondary | ICD-10-CM

## 2022-12-28 DIAGNOSIS — I5032 Chronic diastolic (congestive) heart failure: Secondary | ICD-10-CM | POA: Diagnosis not present

## 2022-12-28 DIAGNOSIS — R0602 Shortness of breath: Secondary | ICD-10-CM | POA: Diagnosis not present

## 2022-12-28 NOTE — Addendum Note (Signed)
Addended by: Gaetano Net on: 12/28/2022 08:50 AM   Modules accepted: Orders

## 2023-01-01 ENCOUNTER — Other Ambulatory Visit (HOSPITAL_COMMUNITY): Payer: Self-pay

## 2023-01-01 ENCOUNTER — Ambulatory Visit (INDEPENDENT_AMBULATORY_CARE_PROVIDER_SITE_OTHER): Payer: PPO | Admitting: Primary Care

## 2023-01-01 ENCOUNTER — Telehealth: Payer: Self-pay

## 2023-01-01 ENCOUNTER — Encounter: Payer: Self-pay | Admitting: Primary Care

## 2023-01-01 VITALS — BP 128/86 | HR 105 | Temp 97.3°F | Ht 64.5 in | Wt 245.0 lb

## 2023-01-01 DIAGNOSIS — D509 Iron deficiency anemia, unspecified: Secondary | ICD-10-CM | POA: Diagnosis not present

## 2023-01-01 DIAGNOSIS — M816 Localized osteoporosis [Lequesne]: Secondary | ICD-10-CM

## 2023-01-01 DIAGNOSIS — E890 Postprocedural hypothyroidism: Secondary | ICD-10-CM | POA: Diagnosis not present

## 2023-01-01 DIAGNOSIS — R748 Abnormal levels of other serum enzymes: Secondary | ICD-10-CM

## 2023-01-01 NOTE — Progress Notes (Signed)
Subjective:    Patient ID: Denise [name], female    DOB: 03-06-1951, 72 y.o.   MRN: [id number]  HPI  Denise [name] is a very pleasant 72 y.o. female  has a past medical history of Allergy, Arthritis, Bursitis of hip, CHF (congestive heart failure) ([place]), Constipation, Endometriosis, Facial flushing (05/12/2014), Fibroid, GERD (gastroesophageal reflux disease), Hematochezia, Hiatal hernia, HSV-1 (herpes simplex virus 1) infection, Hyperlipidemia, Hyperplastic colon polyp, Hypertension, Hypothyroidism, Ischemic colitis ([place]) (01/2019), LBBB (left bundle branch block), Low back pain, Migraine, Osteopenia (2008), Overdose of benzodiazepine (08/11/2021), Prediabetes, Stroke ([place]), and TIA (transient ischemic attack) (08/10/2013). who presents today for repeat thyroid testing and to discuss rash.  1) Hypothyroidism: Currently managed on levothyroxine 75 mcg tablets which she has taken for numerous years.  Her TSH levels have been stable for years at this dose.  One of her cardiology providers notified her that her thyroid function was abnormal and recommended follow-up with PCP.  Given her long-term stability at her current dose of 75 mcg daily, we recommended repeat lab testing.  She is here for testing today.  She is taking levothyroxine every morning on empty stomach with water only.  She is also taking omeprazole, vitamins, iron pill, and magnesium within 1 hour of taking levothyroxine. Historically she has been taking her levothyroxine in the morning with her other medications.    2) Elevated Alk Phos: Chronically elevated for years. Prior years she's undergone extensive lab work up, full bone scan, and GI evaluation. GI could not find a reason for her elevated Alk Phos and recommendation was for endocrinology. She was referred to endocrinology in August 2023 but she kindly declined an appointment once called.    Today she is ready for an endocrinology referral for further evaluation.    3) Osteoporosis: Chronic. Bone density scan from January 2023 with T score of -3.1 to AP/Spine. We contacted patient at the time and offered Fosamax vs Prolia, she never responded.   Today she is ready to pursue treatment with Prolia. She is compliant to her calcium and vitamin D.   Review of Systems  Constitutional:  Positive for fatigue.  Respiratory:  Positive for shortness of breath.   Cardiovascular:  Positive for chest pain and palpitations.         Past Medical History:  Diagnosis Date   Allergy    seasonal   Arthritis    Bursitis of hip    Left   CHF (congestive heart failure) ([place])    Constipation    90%of the time c/o constipation, occ has diarrhea - uses stool softener    Endometriosis    Facial flushing 05/12/2014   Fibroid    GERD (gastroesophageal reflux disease)    Hematochezia    Hiatal hernia    HSV-1 (herpes simplex virus 1) infection    Hyperlipidemia    Hyperplastic colon polyp    Hypertension    Hypothyroidism    Ischemic colitis ([place]) 01/2019   LBBB (left bundle branch block)    Low back pain    Migraine    Osteopenia 2008   -1.2 FEMORAL NECK   Overdose of benzodiazepine 08/11/2021   Prediabetes    Stroke [facility])    TIA   TIA (transient ischemic attack) 08/10/2013    Social History   Socioeconomic History   Marital status: Divorced    Spouse name: Not on file   Number of children: 2   Years of education: Not on file   Highest  education level: Not on file  Occupational History    Employer: RETIRED  Tobacco Use   Smoking status: Never    Passive exposure: Past   Smokeless tobacco: Never  Vaping Use   Vaping Use: Never used  Substance and Sexual Activity   Alcohol use: Yes    Alcohol/week: 0.0 standard drinks of alcohol    Comment: 1 every 3 months   Drug use: No   Sexual activity: Not Currently    Comment: intercourse age 73, more than 5 sexual partners,des neg  Other Topics Concern   Not on file  Social History Narrative    Single.   2 daughters, 4 grandchildren.   Once worked for [employer].   Enjoys Ambulance person.    Social Determinants of Health   Financial Resource Strain: Low Risk  (12/28/2021)   Overall Financial Resource Strain (CARDIA)    Difficulty of Paying Living Expenses: Not hard at all  Food Insecurity: No Food Insecurity (01/26/2019)   Hunger Vital Sign    Worried About Running Out of Food in the Last Year: Never true    Ran Out of Food in the Last Year: Never true  Transportation Needs: No Transportation Needs (01/26/2019)   PRAPARE - Hydrologist (Medical): No    Lack of Transportation (Non-Medical): No  Physical Activity: Unknown (01/26/2019)   Exercise Vital Sign    Days of Exercise per Week: Patient refused    Minutes of Exercise per Session: Patient refused  Stress: No Stress Concern Present (01/26/2019)   [employer] [place]    Feeling of Stress : Not at all  Social Connections: Not on file  Intimate Partner Violence: Not on file    Past Surgical History:  Procedure Laterality Date   [place], [place]  2003   COLONOSCOPY     KNEE SURGERY  2004   right   NASAL SEPTUM SURGERY  2012   PITUITARY SURGERY  2001   POLYPECTOMY     SHOULDER SURGERY  2007   right   THYROID LOBECTOMY  2002    Family History  Problem Relation Age of Onset   Cancer Mother        brain tumor   Heart disease Father    Parkinson's disease Sister    Breast cancer Maternal Aunt    Colon cancer Neg Hx    Colon polyps Neg Hx    Rectal cancer Neg Hx    Stomach cancer Neg Hx    Thyroid disease Neg Hx    Esophageal cancer Neg Hx    Pancreatic cancer Neg Hx     Allergies  Allergen Reactions   Tramadol Other (See Comments)    Reaction: NIGHTMARES Other reaction(s): Not available    Current Outpatient Medications on File Prior to Visit   Medication Sig Dispense Refill   albuterol (VENTOLIN HFA) 108 (90 Base) MCG/ACT inhaler Inhale 2 puffs into the lungs every 6 (six) hours as needed for wheezing or shortness of breath. 8 g 6   aspirin 81 MG tablet Take 81 mg by mouth daily.     atorvastatin (LIPITOR) 10 MG tablet TAKE 1 TABLET BY MOUTH DAILY FOR CHOLESTEROL 90 tablet 2   buPROPion ER (WELLBUTRIN SR) 100 MG 12 hr tablet TAKE 1 TABLET (100 MG TOTAL) BY MOUTH 2 (TWO) TIMES DAILY. FOR DEPRESSION 180 tablet 0  Cholecalciferol (D3) 10 MCG (400 UNIT) CHEW Chew by mouth.     DULoxetine (CYMBALTA) 30 MG capsule TAKE 1 CAPSULE BY MOUTH DAILY  FOR ANXIETY AND DEPRESSION TAKE  WITH 60 MG CAPSULE 90 capsule 3   DULoxetine (CYMBALTA) 60 MG capsule TAKE 1 CAPSULE BY MOUTH  DAILY FOR HEADACHE  PREVENTION 90 capsule 3   empagliflozin (JARDIANCE) 10 MG TABS tablet Take 1 tablet (10 mg total) by mouth daily before breakfast. 90 tablet 3   ferrous sulfate 325 (65 FE) MG EC tablet Take 1 tablet (325 mg total) by mouth daily with breakfast. 90 tablet 0   KLOR-CON M10 10 MEQ tablet Take 10 mEq by mouth as needed. With lasix.     lansoprazole (EQ LANSOPRAZOLE) 15 MG capsule Take 15 mg by mouth 2 (two) times daily.     levothyroxine (SYNTHROID) 75 MCG tablet TAKE 1 TABLET BY MOUTH IN THE  MORNING ON AN EMPTY STOMACH WITH WATER ONLY. NO FOOD OR OTHER  MEDICATIONS FOR 1/2 HOUR 90 tablet 2   magnesium 30 MG tablet Take 30 mg by mouth daily.     metoprolol succinate (TOPROL-XL) 50 MG 24 hr tablet Take 1 tablet (50 mg total) by mouth daily. Take with or immediately following a meal. 90 tablet 3   metoprolol tartrate (LOPRESSOR) 25 MG tablet Take one (1) tablet by mouth ( 25 mg) Heart Rate greater than 100 or palpitations. 30 tablet 6   mirtazapine (REMERON) 15 MG tablet TAKE 1 TABLET (15 MG TOTAL) BY MOUTH AT BEDTIME. FOR SLEEP 90 tablet 1   nitroGLYCERIN (NITROSTAT) 0.4 MG SL tablet Dissolve 1 tablet under the tongue every 5 minutes as needed for chest pain.  Max of 3 doses, then 911. 25 tablet 6   sacubitril-valsartan (ENTRESTO) 49-51 MG Take 1 tablet by mouth 2 (two) times daily. 60 tablet 11   spironolactone (ALDACTONE) 25 MG tablet Take 1 tablet (25 mg total) by mouth daily. 90 tablet 3   tizanidine (ZANAFLEX) 2 MG capsule Take 2 mg by mouth 2 (two) times daily as needed for muscle spasms.     No current facility-administered medications on file prior to visit.    BP 128/86   Pulse (!) 105   Temp (!) 97.3 F (36.3 C) (Temporal)   Ht 5' 4.5" (1.638 m)   Wt 245 lb (111.1 kg)   SpO2 98%   BMI 41.40 kg/m  Objective:   Physical Exam Cardiovascular:     Rate and Rhythm: Normal rate and regular rhythm.  Pulmonary:     Effort: Pulmonary effort is normal.     Breath sounds: Normal breath sounds.  Musculoskeletal:     Cervical back: Neck supple.  Skin:    General: Skin is warm and dry.  Psychiatric:        Mood and Affect: Mood normal.           Assessment & Plan:  Elevated alkaline phosphatase level Assessment & Plan: Remains chronic. Prior extensive GI and cancer work up, all negative.  She is agreeable to seeing endocrinology now. Referral placed.   Orders: -     Ambulatory referral to Endocrinology  Iron deficiency anemia, unspecified iron deficiency anemia type Assessment & Plan: Continue oral iron daily. Discussed to separate 4 hours from levothyroxine.  Repeat iron studies pending.  Orders: -     IBC + Ferritin  Postoperative hypothyroidism Assessment & Plan: She is taking levothyroxine incorrectly, has been doing so for years. We've discussed  correct administration for taking levothyroxine on numerous prior visits. We re-visited this again today.  Repeat TSH pending.  Adding Free T4.  Continue levothyroxine 75 mcg daily.   Orders: -     TSH -     T4, free  Localized osteoporosis without current pathological fracture Assessment & Plan: Reviewed bone density scan from 2023. She agrees to  treatment, opts for Prolia.  Will send notes to our Prolia coordinator.          Pleas Koch, NP

## 2023-01-01 NOTE — Patient Instructions (Signed)
Stop by the lab prior to leaving today. I will notify you of your results once received.   Be sure to take your levothyroxine (thyroid medication) every morning on an empty stomach with water only.   No food or other medications for 30 minutes.   No heartburn medication, iron pills, calcium, vitamin D, or magnesium pills within four hours of taking levothyroxine.   You will either be contacted via phone regarding your referral to endocrinology, or you may receive a letter on your MyChart portal from our referral team with instructions for scheduling an appointment. Please let us know if you have not been contacted by anyone within two weeks.  It was a pleasure to see you today!

## 2023-01-01 NOTE — Telephone Encounter (Signed)
Pharmacy Patient Advocate Encounter  Insurance verification completed.    The patient is insured through [employer]   Ran test claims for: Prolia 60mg.  Pharmacy benefit copay: $200.00  

## 2023-01-01 NOTE — Assessment & Plan Note (Signed)
Remains chronic. Prior extensive GI and cancer work up, all negative.  She is agreeable to seeing endocrinology now. Referral placed.

## 2023-01-01 NOTE — Telephone Encounter (Signed)
Prolia VOB initiated via [email or web address]

## 2023-01-01 NOTE — Assessment & Plan Note (Signed)
Reviewed bone density scan from 2023. She agrees to treatment, opts for Prolia.  Will send notes to our Prolia coordinator.

## 2023-01-01 NOTE — Assessment & Plan Note (Signed)
Continue oral iron daily. Discussed to separate 4 hours from levothyroxine.  Repeat iron studies pending.

## 2023-01-01 NOTE — Telephone Encounter (Signed)
Patient wold like to start on prolia please run benefits.

## 2023-01-01 NOTE — Assessment & Plan Note (Signed)
She is taking levothyroxine incorrectly, has been doing so for years. We've discussed correct administration for taking levothyroxine on numerous prior visits. We re-visited this again today.  Repeat TSH pending.  Adding Free T4.  Continue levothyroxine 75 mcg daily.

## 2023-01-02 LAB — IBC + FERRITIN
Ferritin: 20.5 ng/mL (ref 10.0–291.0)
Iron: 63 ug/dL (ref 42–145)
Saturation Ratios: 15.8 % — ABNORMAL LOW (ref 20.0–50.0)
TIBC: 397.6 ug/dL (ref 250.0–450.0)
Transferrin: 284 mg/dL (ref 212.0–360.0)

## 2023-01-02 LAB — T4, FREE: Free T4: 0.8 ng/dL (ref 0.60–1.60)

## 2023-01-02 LAB — TSH: TSH: 3.09 u[IU]/mL (ref 0.35–5.50)

## 2023-01-03 ENCOUNTER — Other Ambulatory Visit (HOSPITAL_COMMUNITY): Payer: Self-pay

## 2023-01-04 ENCOUNTER — Other Ambulatory Visit (HOSPITAL_COMMUNITY): Payer: Self-pay

## 2023-01-04 MED ORDER — FUROSEMIDE 20 MG PO TABS: 20.0000 mg | ORAL_TABLET | Freq: Every day | ORAL | 3 refills | Status: DC

## 2023-01-04 MED ORDER — METOPROLOL TARTRATE 25 MG PO TABS: 25.0000 mg | ORAL_TABLET | ORAL | 6 refills | Status: DC | PRN

## 2023-01-04 MED ORDER — ALBUTEROL SULFATE HFA 108 (90 BASE) MCG/ACT IN AERS: 2.0000 | INHALATION_SPRAY | Freq: Four times a day (QID) | RESPIRATORY_TRACT | 6 refills | Status: DC | PRN

## 2023-01-04 MED ORDER — ATORVASTATIN CALCIUM 10 MG PO TABS: 10.0000 mg | ORAL_TABLET | Freq: Every day | ORAL | 2 refills | Status: DC

## 2023-01-04 MED ORDER — LEVOTHYROXINE SODIUM 75 MCG PO TABS: 75.0000 ug | ORAL_TABLET | Freq: Every morning | ORAL | 2 refills | Status: DC

## 2023-01-04 MED ORDER — DULOXETINE HCL 30 MG PO CPEP: 30.0000 mg | ORAL_CAPSULE | Freq: Every day | ORAL | 3 refills | Status: DC

## 2023-01-04 MED ORDER — PREDNISOLONE ACETATE 1 % OP SUSP: 1.0000 [drp] | Freq: Four times a day (QID) | OPHTHALMIC | 1 refills | Status: DC

## 2023-01-04 MED ORDER — METOPROLOL SUCCINATE ER 50 MG PO TB24
50.0000 mg | ORAL_TABLET | Freq: Every day | ORAL | 3 refills | Status: DC
  Filled 2023-01-04: qty 90, 90d supply, fill #0
  Filled 2023-01-14: qty 100, 100d supply, fill #0
  Filled 2023-11-28: qty 100, 100d supply, fill #1

## 2023-01-04 MED ORDER — METOPROLOL SUCCINATE ER 25 MG PO TB24: 25.0000 mg | ORAL_TABLET | Freq: Every day | ORAL | 1 refills | Status: DC

## 2023-01-04 MED ORDER — GATIFLOXACIN 0.5 % OP SOLN: 1.0000 [drp] | Freq: Four times a day (QID) | OPHTHALMIC | 1 refills | Status: DC

## 2023-01-04 MED ORDER — POTASSIUM CHLORIDE ER 10 MEQ PO TBCR: 20.0000 meq | EXTENDED_RELEASE_TABLET | Freq: Every day | ORAL | 3 refills | Status: DC

## 2023-01-04 MED ORDER — AMLODIPINE BESYLATE 10 MG PO TABS: 10.0000 mg | ORAL_TABLET | Freq: Every day | ORAL | 3 refills | Status: DC

## 2023-01-04 MED ORDER — TORSEMIDE 20 MG PO TABS
20.0000 mg | ORAL_TABLET | ORAL | 3 refills | Status: DC
  Filled 2023-01-04 – 2023-06-06 (×2): qty 36, 84d supply, fill #0

## 2023-01-04 MED ORDER — POTASSIUM CHLORIDE ER 10 MEQ PO TBCR: 10.0000 meq | EXTENDED_RELEASE_TABLET | Freq: Every day | ORAL | 3 refills | Status: DC

## 2023-01-04 MED ORDER — EMPAGLIFLOZIN 10 MG PO TABS
10.0000 mg | ORAL_TABLET | Freq: Every day | ORAL | 3 refills | Status: DC
  Filled 2023-01-14: qty 100, 100d supply, fill #0

## 2023-01-04 MED ORDER — MIRTAZAPINE 15 MG PO TABS
15.0000 mg | ORAL_TABLET | Freq: Every evening | ORAL | 1 refills | Status: DC
  Filled 2023-04-17: qty 90, 90d supply, fill #0

## 2023-01-04 MED ORDER — EMPAGLIFLOZIN 10 MG PO TABS
10.0000 mg | ORAL_TABLET | Freq: Every day | ORAL | 3 refills | Status: DC
  Filled 2023-01-04: qty 90, 90d supply, fill #0
  Filled 2023-06-06: qty 100, 100d supply, fill #0

## 2023-01-04 MED ORDER — KETOROLAC TROMETHAMINE 0.5 % OP SOLN: 1.0000 [drp] | Freq: Four times a day (QID) | OPHTHALMIC | 1 refills | Status: DC

## 2023-01-04 MED ORDER — ENTRESTO 49-51 MG PO TABS
1.0000 | ORAL_TABLET | Freq: Two times a day (BID) | ORAL | 3 refills | Status: DC
  Filled 2023-01-04 – 2023-01-14 (×2): qty 180, 90d supply, fill #0

## 2023-01-04 MED ORDER — SACUBITRIL-VALSARTAN 49-51 MG PO TABS
1.0000 | ORAL_TABLET | Freq: Two times a day (BID) | ORAL | 11 refills | Status: DC
  Filled 2023-01-04: qty 60, 30d supply, fill #0

## 2023-01-04 MED ORDER — METOPROLOL SUCCINATE ER 50 MG PO TB24
25.0000 mg | ORAL_TABLET | Freq: Every day | ORAL | 3 refills | Status: DC
  Filled 2023-01-14: qty 90, 180d supply, fill #0

## 2023-01-05 ENCOUNTER — Other Ambulatory Visit (HOSPITAL_COMMUNITY): Payer: Self-pay

## 2023-01-07 ENCOUNTER — Other Ambulatory Visit (HOSPITAL_COMMUNITY): Payer: Self-pay

## 2023-01-08 ENCOUNTER — Ambulatory Visit: Payer: Medicare Other | Admitting: Cardiovascular Disease

## 2023-01-10 ENCOUNTER — Ambulatory Visit (INDEPENDENT_AMBULATORY_CARE_PROVIDER_SITE_OTHER): Payer: PPO | Admitting: Student

## 2023-01-10 DIAGNOSIS — R0600 Dyspnea, unspecified: Secondary | ICD-10-CM

## 2023-01-10 LAB — PULMONARY FUNCTION TEST
DL/VA % pred: 93 %
DL/VA: 3.83 ml/min/mmHg/L
DLCO cor % pred: 84 %
DLCO cor: 16.66 ml/min/mmHg
DLCO unc % pred: 88 %
DLCO unc: 17.47 ml/min/mmHg
FEF 25-75 Post: 2.4 L/sec
FEF 25-75 Pre: 2.57 L/sec
FEF2575-%Change-Post: -6 %
FEF2575-%Pred-Post: 127 %
FEF2575-%Pred-Pre: 136 %
FEV1-%Change-Post: -2 %
FEV1-%Pred-Post: 106 %
FEV1-%Pred-Pre: 109 %
FEV1-Post: 2.44 L
FEV1-Pre: 2.49 L
FEV1FVC-%Change-Post: 2 %
FEV1FVC-%Pred-Pre: 106 %
FEV6-%Change-Post: -4 %
FEV6-%Pred-Post: 101 %
FEV6-%Pred-Pre: 106 %
FEV6-Post: 2.94 L
FEV6-Pre: 3.08 L
FEV6FVC-%Change-Post: 0 %
FEV6FVC-%Pred-Post: 104 %
FEV6FVC-%Pred-Pre: 104 %
FVC-%Change-Post: -4 %
FVC-%Pred-Post: 97 %
FVC-%Pred-Pre: 101 %
FVC-Post: 2.94 L
FVC-Pre: 3.08 L
Post FEV1/FVC ratio: 83 %
Post FEV6/FVC ratio: 100 %
Pre FEV1/FVC ratio: 81 %
Pre FEV6/FVC Ratio: 100 %
RV % pred: 98 %
RV: 2.2 L
TLC % pred: 101 %
TLC: 5.19 L

## 2023-01-10 NOTE — Progress Notes (Signed)
PFT done today. 

## 2023-01-14 ENCOUNTER — Other Ambulatory Visit (HOSPITAL_COMMUNITY): Payer: Self-pay

## 2023-01-15 DIAGNOSIS — R0602 Shortness of breath: Secondary | ICD-10-CM | POA: Diagnosis not present

## 2023-01-15 DIAGNOSIS — I5032 Chronic diastolic (congestive) heart failure: Secondary | ICD-10-CM | POA: Diagnosis not present

## 2023-01-15 MED ORDER — SACUBITRIL-VALSARTAN 97-103 MG PO TABS
1.0000 | ORAL_TABLET | Freq: Two times a day (BID) | ORAL | 3 refills | Status: DC
  Filled 2023-01-15: qty 180, 90d supply, fill #0
  Filled 2023-10-31: qty 60, 30d supply, fill #0

## 2023-01-15 NOTE — Addendum Note (Signed)
Addended by: Ma Hillock on: 01/15/2023 05:41 PM   Modules accepted: Orders

## 2023-01-15 NOTE — Telephone Encounter (Signed)
Per OV note on 09/04/22: Acute on chronic diastolic congestive heart failure :  Seems to be better.   BP is still a bit elevated.  Will increase Entresto to 97-103 BID  Will see her Oct. 23.  Anticipate increasing toprol if her HR remains elevate.   See MyChart encounter dated 09/17/22. Pt wrote in stating that she hit donut hole with insurance and couldn't get patient assistance for Entresto. [name] switched her to Valsartan '320mg'$  daily to cover through end of year and she was to resume Entresto in January 2024 when plan reset. Dose has not been decreased since then. Incorrect (old) RX was sent in for patient to pharmacy. Will update and send correct strength at this time.

## 2023-01-16 ENCOUNTER — Other Ambulatory Visit (HOSPITAL_COMMUNITY): Payer: Self-pay

## 2023-01-28 ENCOUNTER — Telehealth: Payer: Self-pay | Admitting: Primary Care

## 2023-01-28 NOTE — Telephone Encounter (Signed)
Noted  

## 2023-01-28 NOTE — Telephone Encounter (Signed)
Patient declined AWV today. Please discuss with patient at next visit.

## 2023-01-29 ENCOUNTER — Telehealth (HOSPITAL_COMMUNITY): Payer: Self-pay | Admitting: Emergency Medicine

## 2023-01-29 NOTE — Telephone Encounter (Signed)
Reaching out to patient to offer assistance regarding upcoming cardiac imaging study; pt verbalizes understanding of appt date/time, parking situation and where to check in, pre-test NPO status and medications ordered, and verified current allergies; name and call back number provided for further questions should they arise Denise Bond RN Navigator Cardiac Imaging [facility] [phone] office [phone] cell  Arrival [address] Denies iv issues Denies claustro Denies metal implants Hold diuretics

## 2023-01-29 NOTE — Telephone Encounter (Signed)
Pt ready for scheduling on or after 01/29/23  Out-of-pocket cost due at time of visit: $302  Primary: [employer] - Medicare Prolia co-insurance: 20% (approximately $302) Admin fee co-insurance: 0% (approximately $0)  Deductible: no  Secondary:  Prolia co-insurance:  Admin fee co-insurance:   Deductible:   Prior Auth: not needed PA# Valid:   ** This summary of benefits is an estimation of the patient's out-of-pocket cost. Exact cost may vary based on individual plan coverage.

## 2023-01-30 ENCOUNTER — Ambulatory Visit
Admission: RE | Admit: 2023-01-30 | Discharge: 2023-01-30 | Disposition: A | Discharge status: Home / Self Care | Payer: HMO | Source: Ambulatory Visit | Attending: Nurse Practitioner | Admitting: Nurse Practitioner

## 2023-01-30 ENCOUNTER — Other Ambulatory Visit: Payer: Self-pay | Admitting: Nurse Practitioner

## 2023-01-30 DIAGNOSIS — R0602 Shortness of breath: Secondary | ICD-10-CM | POA: Insufficient documentation

## 2023-01-30 DIAGNOSIS — I5032 Chronic diastolic (congestive) heart failure: Secondary | ICD-10-CM

## 2023-01-30 DIAGNOSIS — I447 Left bundle-branch block, unspecified: Secondary | ICD-10-CM | POA: Insufficient documentation

## 2023-01-30 DIAGNOSIS — R000 Tachycardia, unspecified: Secondary | ICD-10-CM | POA: Insufficient documentation

## 2023-01-30 DIAGNOSIS — I10 Essential (primary) hypertension: Secondary | ICD-10-CM

## 2023-01-30 MED ORDER — GADOBUTROL 1 MMOL/ML IV SOLN
14.0000 mL | Freq: Once | INTRAVENOUS | Status: AC | PRN
  Administered 2023-01-30: 14 mL via INTRAVENOUS

## 2023-02-06 ENCOUNTER — Other Ambulatory Visit (HOSPITAL_COMMUNITY): Payer: Self-pay

## 2023-02-06 NOTE — Telephone Encounter (Signed)
Ran test claim for Prolia today. It came back with copay of $549.54. Not sure what happened from last month. The $302 is the estimate for medical billing (buy and bill)

## 2023-02-07 NOTE — Telephone Encounter (Signed)
Has she tried alendronate (Fosamax) once weekly pill for bone density?  If not then this would be reasonable alternative.  The Fosamax is taken once weekly on an empty stomach with water only.  No food or other medicine for 30 minutes.  Avoid laying flat for 2 hours.

## 2023-02-07 NOTE — Telephone Encounter (Signed)
I called patient to schedule her Prolia inj.  She is concerned about the cost of the injection.  She wants to know if you can prescribe medication in pill form to see if it is any cheaper.

## 2023-02-08 NOTE — Telephone Encounter (Signed)
Left message on voicemail for patient to call the office back. 

## 2023-02-10 ENCOUNTER — Encounter: Payer: Self-pay | Admitting: Cardiovascular Disease

## 2023-02-10 ENCOUNTER — Other Ambulatory Visit: Payer: Self-pay | Admitting: Primary Care

## 2023-02-10 DIAGNOSIS — D509 Iron deficiency anemia, unspecified: Secondary | ICD-10-CM

## 2023-02-10 NOTE — Progress Notes (Unsigned)
Cardiology Office Note:    Date:  02/11/2023   ID:  Denise [name], DOB [birth year], MRN [id number]  PCP:  Denise Koch, NP   [facility] Prov[facility] Cardiologist:  Denise Moores, MD     Referring MD: Denise Koch, NP   Chief Complaint  Patient presents with   Congestive Heart Failure        LBBB       Problem list   LBBB Dyspnea TIA   History of Present Illness:    Denise [name] is a 72 y.o. female with a hx of obesity, dyspnea, TIA, LBBB,   We were asked to see her by Denise [name] for further evaluation of her shortness of breath   Echocardiogram from July 09, 2022 reveals normal LVEF of 55 to 60%. She has no significant valvular disease.  There is trivial  tricuspid regurgitation.  Has had LBBB 3-4 years ago Runs out of breath while walking and while dressing  Unable to take a deep breath  ( Has pulmonary appt in Aug)   Leg edema a year ago.  Edema is gradually worsening  She took lasix for 5 days ( does not remember the dose )  Does not think it helped.   Has 2 pillow orthopnea  Worsening over the past several months   25 lb weight gain over the past year Has not been able to walk much   Does not add much salt.  Is recently trying to avoid processed food   + fam hx of CAD Father had MI in mid 80's  Grandmother had stroke    Review of the labs from her primary medical doctor from June 27 shows a white blood cell count of 13.7.  Chest x-ray revealed diffuse infiltrates. CT scan showed ground glass appearance   Aug. 7, 2023 [name] is seen for a 1 week follow up of her CHF We changed her lasix to torsemide Started Entresto 49-51 BID, Jardiance 10 mg a day   Echo 07/09/22:    Normal LV systolic function - EF 0000000 No significant valvular disease Coronary CTA has been ordered but not scheduled yet  Feels much better  Is urinating more . Is feeling washed out ( likely from the torsemide therapy)   Wt is 226 lbs  ( down 6 lbs from last week )  Diet is better. Less salt  Potassium level was 4.0 on August 3.  This was 3 to 4 days after starting her torsemide and potassium therapy. Echocardiogram from July 24 shows normal left ventricular systolic function.  We were not able to assess her diastolic function. No significant mitral valve disease or aortic valve disease. Very minimal dilatation of the ascending aorta. RV function was normal  Does not have orthopnea any longer, still using 2 pillows   July 31, 2022: Denise [name] seen today for follow-up visit.  She has acute on chronic   diastolic congestive heart failure. Her dyspnea continues to improve .   Has been found to be anemic  Started iron  Is still fatigued  No longer has orthopnea  She needs EGD and colonoscopy  She is at low risk for her upcoming GI procedures    Sept. 19, 2023 Denise [name] is seen for follow up of her chronic diastilic CHF Has had anemia  She is been having some episodes of chest pain.  Coronary CT angiogram performed on August 27, 2022 reveals a coronary calcium score of 0.  She has normal  coronary arteries. She was started on Isosorbide but she stopped due to the severe headache   The GI appointments were not kept because of that her episode of chest pain.  We now have ruled out severe coronary artery disease.  She is cleared to have her colonoscopy and upper GI endoscopy for further evaluation of her GI bleeding.   Oct. 23, 2023 Denise [name] is seen for follow up of her diastolic CHF. Coronary CT angiogram performed on August 27, 2022 reveals a coronary calcium score of 0.  She has normal coronary arteries.  We started the entresto several months ago  Had to switch to valsartan due to the cost We also had her on spironolactone -   We discussed the importance of weight loss   Wt today is 230 lbs    Feb. 26, 2024 Denise [name] is seen for follow up of her diastolic CHF Cor CT is 0 Was not able to afford Entresto.   We  started her on Valsartan Started Spironolactone  Wt is 247 lbs ( up 17 lbs ) ,  is trying to eat better . Has not been able to walk due to lower back issues    On the way home from her last visit with Denise Bame, NP , she had intense L arm pain  Lasted 10-15 minutes.  Gradually resolved   Is now back on Entrest for now Will likely need to switch to valsartan in Nov/ Dec.      Past Medical History:  Diagnosis Date   Allergy    seasonal   Arthritis    Bursitis of hip    Left   CHF (congestive heart failure) ([place])    Constipation    90%of the time c/o constipation, occ has diarrhea - uses stool softener    Endometriosis    Facial flushing 05/12/2014   Fibroid    GERD (gastroesophageal reflux disease)    Hematochezia    Hiatal hernia    HSV-1 (herpes simplex virus 1) infection    Hyperlipidemia    Hyperplastic colon polyp    Hypertension    Hypothyroidism    Ischemic colitis ([place]) 01/2019   LBBB (left bundle branch block)    Low back pain    Migraine    Osteopenia 2008   -1.2 FEMORAL NECK   Overdose of benzodiazepine 08/11/2021   Prediabetes    Stroke [facility])    TIA   TIA (transient ischemic attack) 08/10/2013    Past Surgical History:  Procedure Laterality Date   ABDOMINAL HYSTERECTOMY  1989   TAH, BSO   CHOLECYSTECTOMY  2003   COLONOSCOPY     KNEE SURGERY  2004   right   NASAL SEPTUM SURGERY  2012   PITUITARY SURGERY  2001   POLYPECTOMY     SHOULDER SURGERY  2007   right   THYROID LOBECTOMY  2002    Current Medications: Current Meds  Medication Sig   aspirin 81 MG tablet Take 81 mg by mouth daily.   atorvastatin (LIPITOR) 10 MG tablet Take 1 tablet (10 mg total) by mouth daily.   buPROPion ER (WELLBUTRIN SR) 100 MG 12 hr tablet TAKE 1 TABLET (100 MG TOTAL) BY MOUTH 2 (TWO) TIMES DAILY. FOR DEPRESSION   Cholecalciferol (D3) 10 MCG (400 UNIT) CHEW Chew by mouth.   DULoxetine (CYMBALTA) 60 MG capsule TAKE 1 CAPSULE BY MOUTH  DAILY FOR HEADACHE   PREVENTION   empagliflozin (JARDIANCE) 10 MG TABS tablet Take 1 tablet (10  mg total) by mouth daily before breakfast.   ferrous sulfate 325 (65 FE) MG EC tablet Take 1 tablet (325 mg total) by mouth daily.   furosemide (LASIX) 20 MG tablet Take 1 tablet (20 mg total) by mouth daily.   KLOR-CON M10 10 MEQ tablet Take 10 mEq by mouth as needed. With lasix.   lansoprazole (EQ LANSOPRAZOLE) 15 MG capsule Take 15 mg by mouth 2 (two) times daily.   levothyroxine (SYNTHROID) 75 MCG tablet Take 1 tablet (75 mcg total) by mouth in the morning on an empty stomach with water only. No food/other medications for 1/2 hour   magnesium 30 MG tablet Take 30 mg by mouth daily.   metoprolol succinate (TOPROL-XL) 50 MG 24 hr tablet Take 1 tablet (50 mg total) by mouth daily. Take with or immediately following a meal.   metoprolol tartrate (LOPRESSOR) 25 MG tablet Take one (1) tablet by mouth ( 25 mg) Heart Rate greater than 100 or palpitations.   mirtazapine (REMERON) 15 MG tablet Take 1 tablet (15 mg total) by mouth at bedtime for sleep.   nitroGLYCERIN (NITROSTAT) 0.4 MG SL tablet Dissolve 1 tablet under the tongue every 5 minutes as needed for chest pain. Max of 3 doses, then 911.   sacubitril-valsartan (ENTRESTO) 97-103 MG Take 1 tablet by mouth 2 (two) times daily.   spironolactone (ALDACTONE) 25 MG tablet Take 1 tablet (25 mg total) by mouth daily.   tizanidine (ZANAFLEX) 2 MG capsule Take 2 mg by mouth 2 (two) times daily as needed for muscle spasms.   torsemide (DEMADEX) 20 MG tablet Take 1 tablet (20 mg total) by mouth every Monday, Wednesday, and Friday.   [DISCONTINUED] metoprolol tartrate (LOPRESSOR) 25 MG tablet Take 1 tablet (25 mg total) by mouth as needed for heart rate greater than 100 or palpitations.     Allergies:   Tramadol   Social History   Socioeconomic History   Marital status: Divorced    Spouse name: Not on file   Number of children: 2   Years of education: Not on file   Highest  education level: Not on file  Occupational History    Employer: RETIRED  Tobacco Use   Smoking status: Never    Passive exposure: Past   Smokeless tobacco: Never  Vaping Use   Vaping Use: Never used  Substance and Sexual Activity   Alcohol use: Yes    Alcohol/week: 0.0 standard drinks of alcohol    Comment: 1 every 3 months   Drug use: No   Sexual activity: Not Currently    Comment: intercourse age 87, more than 5 sexual partners,des neg  Other Topics Concern   Not on file  Social History Narrative   Single.   2 daughters, 4 grandchildren.   Once worked for [employer].   Enjoys Ambulance person.    Social Determinants of Health   Financial Resource Strain: Low Risk  (12/28/2021)   Overall Financial Resource Strain (CARDIA)    Difficulty of Paying Living Expenses: Not hard at all  Food Insecurity: No Food Insecurity (01/26/2019)   Hunger Vital Sign    Worried About Running Out of Food in the Last Year: Never true    Ran Out of Food in the Last Year: Never true  Transportation Needs: No Transportation Needs (01/26/2019)   PRAPARE - Hydrologist (Medical): No    Lack of Transportation (Non-Medical): No  Physical Activity: Unknown (01/26/2019)  Exercise Vital Sign    Days of Exercise per Week: Patient refused    Minutes of Exercise per Session: Patient refused  Stress: No Stress Concern Present (01/26/2019)   [place]    Feeling of Stress : Not at all  Social Connections: Not on file     Family History: The patient's family history includes Breast cancer in her maternal aunt; Cancer in her mother; Heart disease in her father; Parkinson's disease in her sister. There is no history of Colon cancer, Colon polyps, Rectal cancer, Stomach cancer, Thyroid disease, Esophageal cancer, or Pancreatic cancer.  ROS:   Please see the history of present illness.      All other systems reviewed and are negative.  EKGs/Labs/Other Studies Reviewed:    The following studies were reviewed today:   EKG:     Recent Labs: 07/19/2022: Magnesium 2.2 12/14/2022: NT-Pro BNP 120 12/17/2022: ALT 15; BUN 15; Creatinine, Ser 1.36; Platelets 337; Potassium 3.9; Sodium 140 12/25/2022: Hemoglobin 15.1 01/01/2023: TSH 3.09  Recent Lipid Panel    Component Value Date/Time   CHOL 110 07/12/2022 1236   TRIG 163.0 (H) 07/12/2022 1236   HDL 42.60 07/12/2022 1236   CHOLHDL 3 07/12/2022 1236   VLDL 32.6 07/12/2022 1236   LDLCALC 34 07/12/2022 1236   LDLDIRECT 72.0 06/23/2021 1245     Risk Assessment/Calculations:      Physical Exam: Blood pressure 120/80, pulse 74, height 5' 4.5" (1.638 m), weight 247 lb (112 kg), SpO2 96 %.       GEN: obese , eldelry female,   in no acute distress HEENT: Normal NECK: No JVD; No carotid bruits LYMPHATICS: No lymphadenopathy CARDIAC: RRR , no murmurs, rubs, gallops RESPIRATORY:  Clear to auscultation without rales, wheezing or rhonchi  ABDOMEN: Soft, non-tender, non-distended MUSCULOSKELETAL:  No edema; No deformity  SKIN: Warm and dry NEUROLOGIC:  Alert and oriented x 3    ECG :        ASSESSMENT:    No diagnosis found.     PLAN:      Acute on chronic diastolic congestive heart failure :    Jackelyn Poling is back on Entreto Unfortunately has gained weight .  Cont weight loss efforts  Cont meds  She may need to switch back to valsartan later this year for the lst month or so of each year   Cardiac MRI is unremarkable    2.  Chest pain:  no CAD . CAC score is 0 . No further evaluation of ischemia is indicated at this time .    3.  Tachycardia:      4..  Iron deficient anemia.            Medication Adjustments/Labs and Tests Ordered: Current medicines are reviewed at length with the patient today.  Concerns regarding medicines are outlined above.  No orders of the defined types were placed in this  encounter.  No orders of the defined types were placed in this encounter.     Patient Instructions  Medication Instructions:   Your physician recommends that you continue on your current medications as directed. Please refer to the Current Medication list given to you today.  *If you need a refill on your cardiac medications before your next appointment, please call your pharmacy*    Follow-Up: At [facility], you and your health needs are our priority.  As part of our continuing mission to provide you with exceptional heart care,  we have created designated Provider Care Teams.  These Care Teams include your primary Cardiologist (physician) and Advanced Practice Providers (APPs -  Physician Assistants and Nurse Practitioners) who all work together to provide you with the care you need, when you need it.  We recommend signing up for the patient portal called "MyChart".  Sign up information is provided on this After Visit Summary.  MyChart is used to connect with patients for Virtual Visits (Telemedicine).  Patients are able to view lab/test results, encounter notes, upcoming appointments, etc.  Non-urgent messages can be sent to your provider as well.   To learn more about what you can do with MyChart, go to [email or web address].    Your next appointment:   1 year(s)  Provider:   Mertie Moores, MD         Signed, Denise Moores, MD  02/11/2023 2:47 PM    [place]

## 2023-02-11 ENCOUNTER — Encounter: Payer: Self-pay | Admitting: Cardiovascular Disease

## 2023-02-11 ENCOUNTER — Ambulatory Visit: Payer: HMO | Attending: Cardiovascular Disease | Admitting: Cardiovascular Disease

## 2023-02-11 VITALS — BP 120/80 | HR 74 | Ht 64.5 in | Wt 247.0 lb

## 2023-02-11 DIAGNOSIS — I5032 Chronic diastolic (congestive) heart failure: Secondary | ICD-10-CM | POA: Diagnosis not present

## 2023-02-11 DIAGNOSIS — I447 Left bundle-branch block, unspecified: Secondary | ICD-10-CM

## 2023-02-11 NOTE — Patient Instructions (Signed)
Medication Instructions:  Your physician recommends that you continue on your current medications as directed. Please refer to the Current Medication list given to you today.  *If you need a refill on your cardiac medications before your next appointment, please call your pharmacy*  Follow-Up: At [facility], you and your health needs are our priority.  As part of our continuing mission to provide you with exceptional heart care, we have created designated Provider Care Teams.  These Care Teams include your primary Cardiologist (physician) and Advanced Practice Providers (APPs -  Physician Assistants and Nurse Practitioners) who all work together to provide you with the care you need, when you need it.  We recommend signing up for the patient portal called "MyChart".  Sign up information is provided on this After Visit Summary.  MyChart is used to connect with patients for Virtual Visits (Telemedicine).  Patients are able to view lab/test results, encounter notes, upcoming appointments, etc.  Non-urgent messages can be sent to your provider as well.   To learn more about what you can do with MyChart, go to [email or web address].    Your next appointment:   1 year(s)  Provider:   Philip [name], MD    

## 2023-02-12 ENCOUNTER — Other Ambulatory Visit (HOSPITAL_COMMUNITY): Payer: Self-pay

## 2023-02-12 MED ORDER — ALENDRONATE SODIUM 70 MG PO TABS
70.0000 mg | ORAL_TABLET | ORAL | 3 refills | Status: DC
  Filled 2023-02-12: qty 12, 84d supply, fill #0
  Filled 2023-06-06: qty 12, 84d supply, fill #1
  Filled 2023-12-31: qty 12, 84d supply, fill #2

## 2023-02-12 NOTE — Addendum Note (Signed)
Addended by: Pleas Koch on: 02/12/2023 11:24 AM   Modules accepted: Orders

## 2023-02-12 NOTE — Telephone Encounter (Signed)
Noted, Rx sent to pharmacy. 

## 2023-02-12 NOTE — Telephone Encounter (Signed)
Called and spoke with patient, she has not tried Fosamax and would like to give it a try. Send to [facility]. Advised of directions for taking pill.

## 2023-02-21 DIAGNOSIS — M546 Pain in thoracic spine: Secondary | ICD-10-CM | POA: Diagnosis not present

## 2023-02-21 DIAGNOSIS — S161XXA Strain of muscle, fascia and tendon at neck level, initial encounter: Secondary | ICD-10-CM | POA: Diagnosis not present

## 2023-02-21 DIAGNOSIS — M069 Rheumatoid arthritis, unspecified: Secondary | ICD-10-CM | POA: Diagnosis not present

## 2023-02-21 DIAGNOSIS — S29019A Strain of muscle and tendon of unspecified wall of thorax, initial encounter: Secondary | ICD-10-CM | POA: Diagnosis not present

## 2023-02-21 DIAGNOSIS — M47812 Spondylosis without myelopathy or radiculopathy, cervical region: Secondary | ICD-10-CM | POA: Diagnosis not present

## 2023-03-19 DIAGNOSIS — M542 Cervicalgia: Secondary | ICD-10-CM | POA: Diagnosis not present

## 2023-03-19 DIAGNOSIS — M5136 Other intervertebral disc degeneration, lumbar region: Secondary | ICD-10-CM | POA: Diagnosis not present

## 2023-04-01 DIAGNOSIS — M542 Cervicalgia: Secondary | ICD-10-CM | POA: Diagnosis not present

## 2023-04-01 DIAGNOSIS — M5451 Vertebrogenic low back pain: Secondary | ICD-10-CM | POA: Diagnosis not present

## 2023-04-04 ENCOUNTER — Other Ambulatory Visit (HOSPITAL_COMMUNITY): Payer: Self-pay

## 2023-04-04 ENCOUNTER — Ambulatory Visit: Payer: HMO | Admitting: Primary Care

## 2023-04-04 ENCOUNTER — Other Ambulatory Visit: Payer: Self-pay | Admitting: Cardiovascular Disease

## 2023-04-04 ENCOUNTER — Other Ambulatory Visit: Payer: Self-pay | Admitting: Primary Care

## 2023-04-04 DIAGNOSIS — G43009 Migraine without aura, not intractable, without status migrainosus: Secondary | ICD-10-CM

## 2023-04-04 DIAGNOSIS — M5451 Vertebrogenic low back pain: Secondary | ICD-10-CM | POA: Diagnosis not present

## 2023-04-04 DIAGNOSIS — F419 Anxiety disorder, unspecified: Secondary | ICD-10-CM

## 2023-04-04 DIAGNOSIS — M542 Cervicalgia: Secondary | ICD-10-CM | POA: Diagnosis not present

## 2023-04-04 DIAGNOSIS — I5032 Chronic diastolic (congestive) heart failure: Secondary | ICD-10-CM

## 2023-04-04 DIAGNOSIS — I10 Essential (primary) hypertension: Secondary | ICD-10-CM

## 2023-04-04 DIAGNOSIS — I447 Left bundle-branch block, unspecified: Secondary | ICD-10-CM

## 2023-04-04 MED ORDER — DULOXETINE HCL 60 MG PO CPEP
60.0000 mg | ORAL_CAPSULE | Freq: Every day | ORAL | 1 refills | Status: DC
  Filled 2023-04-04: qty 90, 90d supply, fill #0

## 2023-04-04 MED ORDER — SPIRONOLACTONE 25 MG PO TABS
25.0000 mg | ORAL_TABLET | Freq: Every day | ORAL | 3 refills | Status: DC
  Filled 2023-04-04: qty 90, 90d supply, fill #0

## 2023-04-04 MED ORDER — BUPROPION HCL ER (SR) 100 MG PO TB12
100.0000 mg | ORAL_TABLET | Freq: Two times a day (BID) | ORAL | 1 refills | Status: DC
  Filled 2023-04-04: qty 180, 90d supply, fill #0
  Filled 2023-10-30: qty 180, 90d supply, fill #1

## 2023-04-05 ENCOUNTER — Other Ambulatory Visit (HOSPITAL_COMMUNITY): Payer: Self-pay

## 2023-04-09 DIAGNOSIS — M542 Cervicalgia: Secondary | ICD-10-CM | POA: Diagnosis not present

## 2023-04-09 DIAGNOSIS — M5451 Vertebrogenic low back pain: Secondary | ICD-10-CM | POA: Diagnosis not present

## 2023-04-17 ENCOUNTER — Other Ambulatory Visit (HOSPITAL_COMMUNITY): Payer: Self-pay

## 2023-04-17 ENCOUNTER — Other Ambulatory Visit: Payer: Self-pay | Admitting: Primary Care

## 2023-04-17 DIAGNOSIS — G479 Sleep disorder, unspecified: Secondary | ICD-10-CM

## 2023-04-17 DIAGNOSIS — F32A Depression, unspecified: Secondary | ICD-10-CM

## 2023-04-17 MED ORDER — MIRTAZAPINE 15 MG PO TABS
15.0000 mg | ORAL_TABLET | Freq: Every evening | ORAL | 0 refills | Status: DC
  Filled 2023-04-17: qty 90, 90d supply, fill #0

## 2023-04-19 ENCOUNTER — Other Ambulatory Visit (HOSPITAL_COMMUNITY): Payer: Self-pay

## 2023-04-19 DIAGNOSIS — M542 Cervicalgia: Secondary | ICD-10-CM | POA: Diagnosis not present

## 2023-04-19 DIAGNOSIS — M5451 Vertebrogenic low back pain: Secondary | ICD-10-CM | POA: Diagnosis not present

## 2023-04-30 DIAGNOSIS — M542 Cervicalgia: Secondary | ICD-10-CM | POA: Diagnosis not present

## 2023-04-30 DIAGNOSIS — M5451 Vertebrogenic low back pain: Secondary | ICD-10-CM | POA: Diagnosis not present

## 2023-05-16 ENCOUNTER — Ambulatory Visit (INDEPENDENT_AMBULATORY_CARE_PROVIDER_SITE_OTHER): Payer: HMO | Admitting: Primary Care

## 2023-05-16 ENCOUNTER — Encounter: Payer: Self-pay | Admitting: Primary Care

## 2023-05-16 VITALS — BP 138/86 | HR 112 | Temp 96.8°F | Ht 64.5 in | Wt 247.0 lb

## 2023-05-16 DIAGNOSIS — R0602 Shortness of breath: Secondary | ICD-10-CM | POA: Diagnosis not present

## 2023-05-16 DIAGNOSIS — Z6841 Body Mass Index (BMI) 40.0 and over, adult: Secondary | ICD-10-CM

## 2023-05-16 DIAGNOSIS — I5032 Chronic diastolic (congestive) heart failure: Secondary | ICD-10-CM | POA: Diagnosis not present

## 2023-05-16 DIAGNOSIS — D509 Iron deficiency anemia, unspecified: Secondary | ICD-10-CM | POA: Diagnosis not present

## 2023-05-16 NOTE — Patient Instructions (Signed)
Stop by the lab prior to leaving today. I will notify you of your results once received.   You will either be contacted via phone regarding your referral to healthy weight wellness center in [place], or you may receive a letter on your MyChart portal from our referral team with instructions for scheduling an appointment. Please let us know if you have not been contacted by anyone within two weeks.  Start weighing yourself once daily so that you can monitor for fluid accumulation.  Call your heart doctor for a weight gain of greater than 2 pounds in 24 hours or a weight gain of greater than 5 pounds in 1 week.  It was a pleasure to see you today!

## 2023-05-16 NOTE — Assessment & Plan Note (Signed)
Long discussion today and her eating habits and lack of physical activity.  Discussed to limit starchy foods, restaurant food, sweet tea. Start walking at least 5 minutes daily.  Advance as tolerated.  We discussed Ozempic as she would qualify.  I have asked that she check with her insurance company to see if they will cover.  We also discussed the national backorder on this type of medication.   Referral also placed for healthy [facility].

## 2023-05-16 NOTE — Assessment & Plan Note (Signed)
Appears euvolemic today.  BNP ordered and pending. Discussed the importance of daily weights. She will start weighing.

## 2023-05-16 NOTE — Assessment & Plan Note (Signed)
Suspect this is more secondary to her obesity; however, we will rule out other causes.  Labs pending for BNP and d-dimer given recent long car travel.  Discussed to start weighing herself daily.  Limit starchy foods, sweet tea. Increase physical exercise/walking.  Await results.

## 2023-05-16 NOTE — Assessment & Plan Note (Signed)
Repeat iron studies pending. 

## 2023-05-16 NOTE — Progress Notes (Signed)
Subjective:    Patient ID: Denise [name], female    DOB: [birth year], 72 y.o.   MRN: [id number]  HPI  Denise [name] is a very pleasant 72 y.o. female with a history of migraines, hypertension, CHF, hypothyroidism, pituitary adenoma, osteoporosis, prediabetes, hyperlipidemia, lower extremity edema, iron deficiency anemia who presents today to discuss several concerns.  1) Iron Deficiency Anemia: Chronic history.  She feels like her iron levels may be low now given symptoms of fatigue and shortness of breath. She's not sure if her symptoms are secondary to her heart or her iron levels. Her iron panel was last checked in January 2024 with ferritin level of 20.5.   She's been taking ferrous sulfate once daily for at least 6 months.   She continues to experience exertional dyspnea when walking in between rooms in her home, bathing, doing household chores. She mentioned these symptoms to her cardiologist who told her that "everything looks normal and to see him in 1 year".   She recently took a long trip to [facility] for a family member graduation, 7 hour car ride each way, did stop a few times along the way.  She also did a lot of sitting while in [place].  Upon coming back she noticed feeling bloated with mild lower extremity edema. Her edema has resolved since coming home. She denies chest pain with deep inspiration. She did resume her furosemide. She is not weighing herself daily.  2) Class 3 Obesity: Chronic history of obesity.  Also with multiple comorbidities including hypertension, CHF, prediabetes, hyperlipidemia.  She is tearful today and frustrated with her chronic obesity despite trying. She isn't sure if her shortness of breath is secondary to her obesity.   Diet currently consists of:  Breakfast: Toast, applesauce, grits, cheese, fruit Lunch: Skips as she eats breakfast around 10 am or 11 am Dinner: Beans with cheese, restaurant food, meat, potatoes. Over the last several  months she's been eating more at home.  Snacks: Fruit Desserts: Once weekly  Beverages: Water, coffee, sweet tea. She cut out sodas a few months ago.   Exercise: None due to shortness of breath.     Body mass index is 41.74 kg/m.   Wt Readings from Last 3 Encounters:  05/16/23 247 lb (112 kg)  02/11/23 247 lb (112 kg)  01/01/23 245 lb (111.1 kg)      Review of Systems  Constitutional:  Positive for fatigue.  Respiratory:  Positive for shortness of breath.   Cardiovascular:  Negative for chest pain.  Psychiatric/Behavioral:  The patient is nervous/anxious.        Depression          Past Medical History:  Diagnosis Date   Allergy    seasonal   Arthritis    Bursitis of hip    Left   CHF (congestive heart failure) (HCC)    Constipation    90%of the time c/o constipation, occ has diarrhea - uses stool softener    Endometriosis    Facial flushing 05/12/2014   Fibroid    GERD (gastroesophageal reflux disease)    Hematochezia    Hiatal hernia    HSV-1 (herpes simplex virus 1) infection    Hyperlipidemia    Hyperplastic colon polyp    Hypertension    Hypothyroidism    Ischemic colitis (HCC) 01/2019   LBBB (left bundle branch block)    Low back pain    Migraine    Osteopenia 2008   -1.2 FEMORAL NECK  Overdose of benzodiazepine 08/11/2021   Prediabetes    Stroke [facility])    TIA   TIA (transient ischemic attack) 08/10/2013    Social History   Socioeconomic History   Marital status: Divorced    Spouse name: Not on file   Number of children: 2   Years of education: Not on file   Highest education level: Not on file  Occupational History    Employer: RETIRED  Tobacco Use   Smoking status: Never    Passive exposure: Past   Smokeless tobacco: Never  Vaping Use   Vaping Use: Never used  Substance and Sexual Activity   Alcohol use: Yes    Alcohol/week: 0.0 standard drinks of alcohol    Comment: 1 every 3 months   Drug use: No   Sexual activity: Not  Currently    Comment: intercourse age 39, more than 5 sexual partners,des neg  Other Topics Concern   Not on file  Social History Narrative   Single.   2 daughters, 4 grandchildren.   Once worked for [employer].   Enjoys Catering manager.    Social Determinants of Health   Financial Resource Strain: Low Risk  (12/28/2021)   Overall Financial Resource Strain (CARDIA)    Difficulty of Paying Living Expenses: Not hard at all  Food Insecurity: No Food Insecurity (01/26/2019)   Hunger Vital Sign    Worried About Running Out of Food in the Last Year: Never true    Ran Out of Food in the Last Year: Never true  Transportation Needs: No Transportation Needs (01/26/2019)   PRAPARE - Administrator, Civil Service (Medical): No    Lack of Transportation (Non-Medical): No  Physical Activity: Unknown (01/26/2019)   Exercise Vital Sign    Days of Exercise per Week: Patient declined    Minutes of Exercise per Session: Patient declined  Stress: No Stress Concern Present (01/26/2019)   [employer] of Occupational Health - Occupational Stress Questionnaire    Feeling of Stress : Not at all  Social Connections: Not on file  Intimate Partner Violence: Not on file    Past Surgical History:  Procedure Laterality Date   ABDOMINAL HYSTERECTOMY  1989   TAH, BSO   CHOLECYSTECTOMY  2003   COLONOSCOPY     KNEE SURGERY  2004   right   NASAL SEPTUM SURGERY  2012   PITUITARY SURGERY  2001   POLYPECTOMY     SHOULDER SURGERY  2007   right   THYROID LOBECTOMY  2002    Family History  Problem Relation Age of Onset   Cancer Mother        brain tumor   Heart disease Father    Parkinson's disease Sister    Breast cancer Maternal Aunt    Colon cancer Neg Hx    Colon polyps Neg Hx    Rectal cancer Neg Hx    Stomach cancer Neg Hx    Thyroid disease Neg Hx    Esophageal cancer Neg Hx    Pancreatic cancer Neg Hx     Allergies  Allergen Reactions    Tramadol Other (See Comments)    Reaction: NIGHTMARES Other reaction(s): Not available    Current Outpatient Medications on File Prior to Visit  Medication Sig Dispense Refill   alendronate (FOSAMAX) 70 MG tablet Take 1 tablet (70 mg total) by mouth every 7 (seven) days. Take with a full glass of water on an empty stomach. Avoid  laying flat for 2 hours. 12 tablet 3   aspirin 81 MG tablet Take 81 mg by mouth daily.     atorvastatin (LIPITOR) 10 MG tablet Take 1 tablet (10 mg total) by mouth daily. 90 tablet 2   buPROPion ER (WELLBUTRIN SR) 100 MG 12 hr tablet Take 1 tablet (100 mg total) by mouth 2 (two) times daily for depression 180 tablet 1   Cholecalciferol (D3) 10 MCG (400 UNIT) CHEW Chew by mouth.     DULoxetine (CYMBALTA) 60 MG capsule Take 1 capsule (60 mg total) by mouth daily for headache prevention 90 capsule 1   empagliflozin (JARDIANCE) 10 MG TABS tablet Take 1 tablet (10 mg total) by mouth daily before breakfast. 90 tablet 3   ferrous sulfate 325 (65 FE) MG EC tablet Take 1 tablet (325 mg total) by mouth daily. 90 tablet 1   furosemide (LASIX) 20 MG tablet Take 1 tablet (20 mg total) by mouth daily. 90 tablet 3   lansoprazole (EQ LANSOPRAZOLE) 15 MG capsule Take 15 mg by mouth 2 (two) times daily.     levothyroxine (SYNTHROID) 75 MCG tablet Take 1 tablet (75 mcg total) by mouth in the morning on an empty stomach with water only. No food/other medications for 1/2 hour 90 tablet 2   magnesium 30 MG tablet Take 30 mg by mouth daily.     metoprolol succinate (TOPROL-XL) 50 MG 24 hr tablet Take 1 tablet (50 mg total) by mouth daily. Take with or immediately following a meal. 90 tablet 3   metoprolol tartrate (LOPRESSOR) 25 MG tablet Take one (1) tablet by mouth ( 25 mg) Heart Rate greater than 100 or palpitations. 30 tablet 6   mirtazapine (REMERON) 15 MG tablet Take 1 tablet (15 mg total) by mouth at bedtime for sleep. 90 tablet 0   sacubitril-valsartan (ENTRESTO) 97-103 MG Take 1  tablet by mouth 2 (two) times daily. 180 tablet 3   spironolactone (ALDACTONE) 25 MG tablet Take 1 tablet (25 mg total) by mouth daily. 90 tablet 3   KLOR-CON M10 10 MEQ tablet Take 10 mEq by mouth as needed. With lasix. (Patient not taking: Reported on 05/16/2023)     nitroGLYCERIN (NITROSTAT) 0.4 MG SL tablet Dissolve 1 tablet under the tongue every 5 minutes as needed for chest pain. Max of 3 doses, then 911. (Patient not taking: Reported on 05/16/2023) 25 tablet 6   tizanidine (ZANAFLEX) 2 MG capsule Take 2 mg by mouth 2 (two) times daily as needed for muscle spasms. (Patient not taking: Reported on 05/16/2023)     torsemide (DEMADEX) 20 MG tablet Take 1 tablet (20 mg total) by mouth every Monday, Wednesday, and Friday. (Patient not taking: Reported on 05/16/2023) 36 tablet 3   No current facility-administered medications on file prior to visit.    BP 138/86   Pulse (!) 112   Temp (!) 96.8 F (36 C) (Temporal)   Ht 5' 4.5" (1.638 m)   Wt 247 lb (112 kg)   SpO2 97%   BMI 41.74 kg/m  Objective:   Physical Exam Cardiovascular:     Rate and Rhythm: Normal rate and regular rhythm.  Pulmonary:     Effort: Pulmonary effort is normal.     Breath sounds: Normal breath sounds.  Musculoskeletal:     Cervical back: Neck supple.  Skin:    General: Skin is warm and dry.  Psychiatric:     Comments: Tearful during exam  Assessment & Plan:  Iron deficiency anemia, unspecified iron deficiency anemia type Assessment & Plan: Repeat iron studies pending.  Orders: -     CBC -     Iron, TIBC and Ferritin Panel  Class 3 severe obesity due to excess calories with serious comorbidity and body mass index (BMI) of 40.0 to 44.9 in adult [facility]) Assessment & Plan: Long discussion today and her eating habits and lack of physical activity.  Discussed to limit starchy foods, restaurant food, sweet tea. Start walking at least 5 minutes daily.  Advance as tolerated.  We discussed Ozempic  as she would qualify.  I have asked that she check with her insurance company to see if they will cover.  We also discussed the national backorder on this type of medication.   Referral also placed for healthy [facility].  Orders: -     Amb Ref to Medical Weight Management  Chronic diastolic CHF (congestive heart failure) (HCC) Assessment & Plan: Appears euvolemic today.  BNP ordered and pending. Discussed the importance of daily weights. She will start weighing.  Orders: -     Brain natriuretic peptide  Shortness of breath Assessment & Plan: Suspect this is more secondary to her obesity; however, we will rule out other causes.  Labs pending for BNP and d-dimer given recent long car travel.  Discussed to start weighing herself daily.  Limit starchy foods, sweet tea. Increase physical exercise/walking.  Await results.   Orders: -     D-dimer, quantitative        Doreene Nest, NP

## 2023-05-17 LAB — CBC
HCT: 46.7 % — ABNORMAL HIGH (ref 35.0–45.0)
Hemoglobin: 15.3 g/dL (ref 11.7–15.5)
MCH: 27.4 pg (ref 27.0–33.0)
MCHC: 32.8 g/dL (ref 32.0–36.0)
MCV: 83.5 fL (ref 80.0–100.0)
MPV: 10.5 fL (ref 7.5–12.5)
Platelets: 349 10*3/uL (ref 140–400)
RBC: 5.59 10*6/uL — ABNORMAL HIGH (ref 3.80–5.10)
RDW: 15.1 % — ABNORMAL HIGH (ref 11.0–15.0)
WBC: 9.5 10*3/uL (ref 3.8–10.8)

## 2023-05-17 LAB — BRAIN NATRIURETIC PEPTIDE: Brain Natriuretic Peptide: 26 pg/mL (ref <100)

## 2023-05-17 LAB — IRON,TIBC AND FERRITIN PANEL
%SAT: 27 % (calc) (ref 16–45)
Ferritin: 31 ng/mL (ref 16–288)
Iron: 101 ug/dL (ref 45–160)
TIBC: 370 mcg/dL (calc) (ref 250–450)

## 2023-05-17 LAB — D-DIMER, QUANTITATIVE: D-Dimer, Quant: 0.48 mcg/mL FEU (ref <0.50)

## 2023-06-07 ENCOUNTER — Other Ambulatory Visit (HOSPITAL_COMMUNITY): Payer: Self-pay

## 2023-06-07 ENCOUNTER — Other Ambulatory Visit: Payer: Self-pay

## 2023-06-12 ENCOUNTER — Encounter: Payer: Self-pay | Admitting: Primary Care

## 2023-06-12 ENCOUNTER — Telehealth (INDEPENDENT_AMBULATORY_CARE_PROVIDER_SITE_OTHER): Payer: HMO | Admitting: Primary Care

## 2023-06-12 DIAGNOSIS — J019 Acute sinusitis, unspecified: Secondary | ICD-10-CM

## 2023-06-12 HISTORY — DX: Acute sinusitis, unspecified: J01.90

## 2023-06-12 MED ORDER — AMOXICILLIN-POT CLAVULANATE 875-125 MG PO TABS: 1.0000 | ORAL_TABLET | Freq: Two times a day (BID) | ORAL | 0 refills | Status: DC

## 2023-06-12 NOTE — Progress Notes (Signed)
Patient ID: Denise [name], female    DOB: 1951-07-05, 72 y.o.   MRN: [id number]  Virtual visit completed through [employer], a video enabled telemedicine application. Due to national recommendations of social distancing due to COVID-19, a virtual visit is felt to be most appropriate for this patient at this time. Reviewed limitations, risks, security and privacy concerns of performing a virtual visit and the availability of in person appointments. I also reviewed that there may be a patient responsible charge related to this service. The patient agreed to proceed.   Patient location: home Provider location: [place] at [facility], office Persons participating in this virtual visit: patient, provider   If any vitals were documented, they were collected by patient at home unless specified below.    There were no vitals taken for this visit.   CC: Sinus pressure Subjective:   HPI: Denise [name] is a 72 y.o. female with a history of hypertension, migraines, CHF, hypothyroidism, prediabetes, anemia, anxiety and depression presenting on 06/12/2023 for Sinus Problem (Started 06/08/23 /Sinus headache, drainage, sinus pain above and below eyes, cough. /At home covid test negative on 06/09/23)  Chronic symptoms sinus drainage/pressure for years. History of sinus surgery a few years ago. Follows with ENT, last visit was around December 2023, was told symptoms were secondary to allergies.   For the last several months she's experienced post nasal drip. Symptom progressed four days ago with increased headache, bilateral frontal and right maxillary sinus tenderness. Two nights ago she felt feverish, didn't take her temperature. She woke up this morning with sweats. She's notice thick, whitish/greenish.   She's been taking an OTC "allergy medication", saline nasal spray, and Ibuprofen. She has a history of sinusitis, these symptoms feel about the same.   She took a home Covid-19 test which was  negative.      Relevant past medical, surgical, family and social history reviewed and updated as indicated. Interim medical history since our last visit reviewed. Allergies and medications reviewed and updated. Outpatient Medications Prior to Visit  Medication Sig Dispense Refill   alendronate (FOSAMAX) 70 MG tablet Take 1 tablet (70 mg total) by mouth every 7 (seven) days. Take with a full glass of water on an empty stomach. Avoid laying flat for 2 hours. 12 tablet 3   aspirin 81 MG tablet Take 81 mg by mouth daily.     atorvastatin (LIPITOR) 10 MG tablet Take 1 tablet (10 mg total) by mouth daily. 90 tablet 2   buPROPion ER (WELLBUTRIN SR) 100 MG 12 hr tablet Take 1 tablet (100 mg total) by mouth 2 (two) times daily for depression 180 tablet 1   Cholecalciferol (D3) 10 MCG (400 UNIT) CHEW Chew by mouth.     DULoxetine (CYMBALTA) 60 MG capsule Take 1 capsule (60 mg total) by mouth daily for headache prevention 90 capsule 1   empagliflozin (JARDIANCE) 10 MG TABS tablet Take 1 tablet (10 mg) by mouth daily before breakfast. 90 tablet 3   furosemide (LASIX) 20 MG tablet Take 1 tablet (20 mg total) by mouth daily. 90 tablet 3   KLOR-CON M10 10 MEQ tablet Take 10 mEq by mouth as needed. With lasix.     lansoprazole (EQ LANSOPRAZOLE) 15 MG capsule Take 15 mg by mouth 2 (two) times daily.     levothyroxine (SYNTHROID) 75 MCG tablet Take 1 tablet (75 mcg total) by mouth in the morning on an empty stomach with water only. No food/other medications for 1/2 hour  90 tablet 2   magnesium 30 MG tablet Take 30 mg by mouth daily.     metoprolol succinate (TOPROL-XL) 50 MG 24 hr tablet Take 1 tablet (50 mg total) by mouth daily. Take with or immediately following a meal. 90 tablet 3   metoprolol tartrate (LOPRESSOR) 25 MG tablet Take one (1) tablet by mouth ( 25 mg) Heart Rate greater than 100 or palpitations. 30 tablet 6   mirtazapine (REMERON) 15 MG tablet Take 1 tablet (15 mg total) by mouth at bedtime for  sleep. 90 tablet 0   sacubitril-valsartan (ENTRESTO) 97-103 MG Take 1 tablet by mouth 2 (two) times daily. 180 tablet 3   spironolactone (ALDACTONE) 25 MG tablet Take 1 tablet (25 mg total) by mouth daily. 90 tablet 3   torsemide (DEMADEX) 20 MG tablet Take 1 tablet (20 mg total) by mouth every Monday, Wednesday, and Friday. 36 tablet 3   ferrous sulfate 325 (65 FE) MG EC tablet Take 1 tablet (325 mg total) by mouth daily. (Patient not taking: Reported on 06/12/2023) 90 tablet 1   nitroGLYCERIN (NITROSTAT) 0.4 MG SL tablet Dissolve 1 tablet under the tongue every 5 minutes as needed for chest pain. Max of 3 doses, then 911. (Patient not taking: Reported on 05/16/2023) 25 tablet 6   tizanidine (ZANAFLEX) 2 MG capsule Take 2 mg by mouth 2 (two) times daily as needed for muscle spasms. (Patient not taking: Reported on 05/16/2023)     No facility-administered medications prior to visit.     Per HPI unless specifically indicated in ROS section below Review of Systems  Constitutional:  Positive for chills and fatigue.  HENT:  Positive for congestion, postnasal drip, sinus pressure, sinus pain and sore throat.   Respiratory:  Positive for cough.   Neurological:  Positive for headaches.   Objective:  There were no vitals taken for this visit.  Wt Readings from Last 3 Encounters:  05/16/23 247 lb (112 kg)  02/11/23 247 lb (112 kg)  01/01/23 245 lb (111.1 kg)       Physical exam: General: Alert and oriented x 3, no distress, does not appear sickly  Pulmonary: Speaks in complete sentences without increased work of breathing, no cough during visit.  Psychiatric: Normal mood, thought content, and behavior.     Results for orders placed or performed in visit on 05/16/23  CBC  Result Value Ref Range   WBC 9.5 3.8 - 10.8 Thousand/uL   RBC 5.59 (H) 3.80 - 5.10 Million/uL   Hemoglobin 15.3 11.7 - 15.5 g/dL   HCT 16.1 (H) 09.6 - 04.5 %   MCV 83.5 80.0 - 100.0 fL   MCH 27.4 27.0 - 33.0 pg   MCHC  32.8 32.0 - 36.0 g/dL   RDW 40.9 (H) 81.1 - 91.4 %   Platelets 349 140 - 400 Thousand/uL   MPV 10.5 7.5 - 12.5 fL  Brain natriuretic peptide  Result Value Ref Range   Brain Natriuretic Peptide 26 <100 pg/mL  Iron, TIBC and Ferritin Panel  Result Value Ref Range   Iron 101 45 - 160 mcg/dL   TIBC 782 956 - 213 mcg/dL (calc)   %SAT 27 16 - 45 % (calc)   Ferritin 31 16 - 288 ng/mL  D-dimer, quantitative  Result Value Ref Range   D-Dimer, Quant 0.48 <0.50 mcg/mL FEU   Assessment & Plan:   Problem List Items Addressed This Visit       Respiratory   Acute non-recurrent sinusitis -  Primary    Unclear if this is an exacerbation of allergies or true sinusitis. Given the worsening symptoms, coupled with likely fever, will treat.  Start Augmentin antibiotics. Take 1 tablet by mouth twice daily for 7 days. Discussed to switch to Flonase.  Follow up PRN.      Relevant Medications   amoxicillin-clavulanate (AUGMENTIN) 875-125 MG tablet     Meds ordered this encounter  Medications   amoxicillin-clavulanate (AUGMENTIN) 875-125 MG tablet    Sig: Take 1 tablet by mouth 2 (two) times daily.    Dispense:  14 tablet    Refill:  0    Order Specific Question:   Supervising Provider    Answer:   BEDSOLE, AMY E [[id number]]   No orders of the defined types were placed in this encounter.   I discussed the assessment and treatment plan with the patient. The patient was provided an opportunity to ask questions and all were answered. The patient agreed with the plan and demonstrated an understanding of the instructions. The patient was advised to call back or seek an in-person evaluation if the symptoms worsen or if the condition fails to improve as anticipated.  Follow up plan:  Start Augmentin antibiotics. Take 1 tablet by mouth twice daily for 7 days.  Consider Flonase nasal spray for your nasal congestion and sinus pressure.  It was a pleasure to see you today!   Doreene Nest, NP

## 2023-06-12 NOTE — Patient Instructions (Signed)
Start Augmentin antibiotics. Take 1 tablet by mouth twice daily for 7 days.  Consider Flonase nasal spray for your nasal congestion and sinus pressure.  It was a pleasure to see you today!

## 2023-06-12 NOTE — Assessment & Plan Note (Signed)
Unclear if this is an exacerbation of allergies or true sinusitis. Given the worsening symptoms, coupled with likely fever, will treat.  Start Augmentin antibiotics. Take 1 tablet by mouth twice daily for 7 days. Discussed to switch to Flonase.  Follow up PRN.

## 2023-06-14 ENCOUNTER — Other Ambulatory Visit (HOSPITAL_COMMUNITY): Payer: Self-pay

## 2023-06-28 ENCOUNTER — Other Ambulatory Visit: Payer: Self-pay | Admitting: Primary Care

## 2023-06-28 ENCOUNTER — Other Ambulatory Visit (HOSPITAL_COMMUNITY): Payer: Self-pay

## 2023-06-28 DIAGNOSIS — F32A Depression, unspecified: Secondary | ICD-10-CM

## 2023-06-28 DIAGNOSIS — G479 Sleep disorder, unspecified: Secondary | ICD-10-CM

## 2023-07-05 ENCOUNTER — Encounter: Payer: Self-pay | Admitting: Pharmacist

## 2023-07-05 NOTE — Progress Notes (Signed)
Patient previously followed by [facility] pharmacist. Per clinical review, no pharmacist appointment needed at this time.

## 2023-07-18 ENCOUNTER — Encounter: Payer: Medicare Other | Admitting: Pharmacist

## 2023-07-30 ENCOUNTER — Ambulatory Visit: Payer: HMO | Admitting: Family Medicine

## 2023-07-31 ENCOUNTER — Ambulatory Visit (INDEPENDENT_AMBULATORY_CARE_PROVIDER_SITE_OTHER)
Admission: RE | Admit: 2023-07-31 | Discharge: 2023-07-31 | Disposition: A | Discharge status: Home / Self Care | Payer: HMO | Source: Ambulatory Visit | Attending: Primary Care | Admitting: Primary Care

## 2023-07-31 ENCOUNTER — Ambulatory Visit (INDEPENDENT_AMBULATORY_CARE_PROVIDER_SITE_OTHER): Payer: HMO | Admitting: Primary Care

## 2023-07-31 VITALS — BP 132/78 | HR 90 | Temp 97.6°F | Ht 64.5 in | Wt 252.6 lb

## 2023-07-31 DIAGNOSIS — R109 Unspecified abdominal pain: Secondary | ICD-10-CM | POA: Diagnosis not present

## 2023-07-31 DIAGNOSIS — D509 Iron deficiency anemia, unspecified: Secondary | ICD-10-CM | POA: Diagnosis not present

## 2023-07-31 DIAGNOSIS — R1012 Left upper quadrant pain: Secondary | ICD-10-CM

## 2023-07-31 DIAGNOSIS — K5900 Constipation, unspecified: Secondary | ICD-10-CM | POA: Diagnosis not present

## 2023-07-31 LAB — BASIC METABOLIC PANEL
BUN: 15 mg/dL (ref 6–23)
CO2: 25 mEq/L (ref 19–32)
Calcium: 9.3 mg/dL (ref 8.4–10.5)
Chloride: 103 mEq/L (ref 96–112)
Creatinine, Ser: 1.06 mg/dL (ref 0.40–1.20)
GFR: 52.65 mL/min — ABNORMAL LOW (ref >60.00)
Glucose, Bld: 134 mg/dL — ABNORMAL HIGH (ref 70–99)
Potassium: 3.9 mEq/L (ref 3.5–5.1)
Sodium: 137 mEq/L (ref 135–145)

## 2023-07-31 LAB — CBC WITH DIFFERENTIAL/PLATELET
Basophils Absolute: 0.1 10*3/uL (ref 0.0–0.1)
Basophils Relative: 0.5 % (ref 0.0–3.0)
Eosinophils Absolute: 0.6 10*3/uL (ref 0.0–0.7)
Eosinophils Relative: 6 % — ABNORMAL HIGH (ref 0.0–5.0)
HCT: 40.8 % (ref 36.0–46.0)
Hemoglobin: 13.2 g/dL (ref 12.0–15.0)
Lymphocytes Relative: 25.1 % (ref 12.0–46.0)
Lymphs Abs: 2.5 10*3/uL (ref 0.7–4.0)
MCHC: 32.4 g/dL (ref 30.0–36.0)
MCV: 87 fl (ref 78.0–100.0)
Monocytes Absolute: 0.5 10*3/uL (ref 0.1–1.0)
Monocytes Relative: 4.9 % (ref 3.0–12.0)
Neutro Abs: 6.3 10*3/uL (ref 1.4–7.7)
Neutrophils Relative %: 63.5 % (ref 43.0–77.0)
Platelets: 341 10*3/uL (ref 150.0–400.0)
RBC: 4.7 Mil/uL (ref 3.87–5.11)
RDW: 14.1 % (ref 11.5–15.5)
WBC: 9.9 10*3/uL (ref 4.0–10.5)

## 2023-07-31 LAB — IBC + FERRITIN
Ferritin: 34.8 ng/mL (ref 10.0–291.0)
Iron: 81 ug/dL (ref 42–145)
Saturation Ratios: 24.8 % (ref 20.0–50.0)
TIBC: 326.2 ug/dL (ref 250.0–450.0)
Transferrin: 233 mg/dL (ref 212.0–360.0)

## 2023-07-31 LAB — LIPASE: Lipase: 23 U/L (ref 11.0–59.0)

## 2023-07-31 NOTE — Assessment & Plan Note (Addendum)
Fortunately, she is improving.  Differentials include H. pylori infection, ulcer, constipation. Lower suspicion for pancreatitis or colitis based on presentation. She does have history of diverticulosis.   Labs pending today for CBC with differential, lipase, BMP.  Cannot complete H. pylori testing today as she is on PPI BID regularly.  Discussed that we could test for H. pylori if she were to discontinue her PPI for 2 weeks, she declines.  Checking abdominal plain films today. Consider CT scan if warranted.  Follow-up with GI as scheduled.

## 2023-07-31 NOTE — Progress Notes (Signed)
Subjective:    Patient ID: Denise [name], female    DOB: December 21, 1950, 72 y.o.   MRN: [id number]  Abdominal Pain Pertinent negatives include no diarrhea, fever, nausea or vomiting.    Denise [name] is a very pleasant 72 y.o. female with a history of hypertension, CHF, GERD, prediabetes, diverticulosis, anxiety and depression, overactive bladder, left lower quadrant abdominal pain who presents today to discuss abdominal pain.  Her pain is located to the left upper quadrant of her abdomen which began about 4 days ago. Her pain was more "intense" initially. Her pain is constant for which she describes as dull and achy. Her pain is not worse with eating or drinking.  Her pain has improved over the last two days. She's been taking Tylenol over the last few days.  She denies nausea, vomiting, diarrhea, constipation, changes in appetite, heavy lifting, radiation of pain, urinary symptoms, dark stools. She's recently increased intake of vegetables. Her last bowel movement was last night.   She is overdue for a colonoscopy and endoscopy, has an appointment scheduled for October. She is compliant to her lansoprazole 15 mg BID. She discontinued iron pills in late May 2024.    Review of Systems  Constitutional:  Negative for fever and unexpected weight change.  Gastrointestinal:  Positive for abdominal pain. Negative for blood in stool, diarrhea, nausea and vomiting.         Past Medical History:  Diagnosis Date   Acute non-recurrent sinusitis 06/12/2023   Allergy    seasonal   Arthritis    Bursitis of hip    Left   CHF (congestive heart failure) (HCC)    Colitis 01/26/2019   Constipation    90%of the time c/o constipation, occ has diarrhea - uses stool softener    Endometriosis    Facial flushing 05/12/2014   Fibroid    GERD (gastroesophageal reflux disease)    Hematochezia    Hiatal hernia    HSV-1 (herpes simplex virus 1) infection    Hyperlipidemia    Hyperplastic  colon polyp    Hypertension    Hypothyroidism    Ischemic colitis (HCC) 01/2019   LBBB (left bundle branch block)    Low back pain    Migraine    Osteopenia 2008   -1.2 FEMORAL NECK   Overdose of benzodiazepine 08/11/2021   Prediabetes    Stroke [facility])    TIA   TIA (transient ischemic attack) 08/10/2013    Social History   Socioeconomic History   Marital status: Divorced    Spouse name: Not on file   Number of children: 2   Years of education: Not on file   Highest education level: 12th grade  Occupational History    Employer: RETIRED  Tobacco Use   Smoking status: Never    Passive exposure: Past   Smokeless tobacco: Never  Vaping Use   Vaping status: Never Used  Substance and Sexual Activity   Alcohol use: Yes    Alcohol/week: 0.0 standard drinks of alcohol    Comment: 1 every 3 months   Drug use: No   Sexual activity: Not Currently    Comment: intercourse age 39, more than 5 sexual partners,des neg  Other Topics Concern   Not on file  Social History Narrative   Single.   2 daughters, 4 grandchildren.   Once worked for [employer].   Enjoys Catering manager.    Social Determinants of Health   Financial Resource Strain:  Low Risk  (07/31/2023)   Overall Financial Resource Strain (CARDIA)    Difficulty of Paying Living Expenses: Not very hard  Food Insecurity: Food Insecurity Present (07/31/2023)   Hunger Vital Sign    Worried About Running Out of Food in the Last Year: Never true    Ran Out of Food in the Last Year: Sometimes true  Transportation Needs: No Transportation Needs (07/31/2023)   PRAPARE - Administrator, Civil Service (Medical): No    Lack of Transportation (Non-Medical): No  Physical Activity: Unknown (07/31/2023)   Exercise Vital Sign    Days of Exercise per Week: 0 days    Minutes of Exercise per Session: Not on file  Stress: Stress Concern Present (07/31/2023)   [employer] of Occupational Health -  Occupational Stress Questionnaire    Feeling of Stress : To some extent  Social Connections: Socially Isolated (07/31/2023)   Social Connection and Isolation Panel [NHANES]    Frequency of Communication with Friends and Family: Once a week    Frequency of Social Gatherings with Friends and Family: Once a week    Attends Religious Services: 1 to 4 times per year    Active Member of [employer] or Organizations: No    Attends Engineer, structural: Not on file    Marital Status: Divorced  Catering manager Violence: Not on file    Past Surgical History:  Procedure Laterality Date   ABDOMINAL HYSTERECTOMY  1989   TAH, BSO   CHOLECYSTECTOMY  2003   COLONOSCOPY     KNEE SURGERY  2004   right   NASAL SEPTUM SURGERY  2012   PITUITARY SURGERY  2001   POLYPECTOMY     SHOULDER SURGERY  2007   right   THYROID LOBECTOMY  2002    Family History  Problem Relation Age of Onset   Cancer Mother        brain tumor   Heart disease Father    Parkinson's disease Sister    Breast cancer Maternal Aunt    Colon cancer Neg Hx    Colon polyps Neg Hx    Rectal cancer Neg Hx    Stomach cancer Neg Hx    Thyroid disease Neg Hx    Esophageal cancer Neg Hx    Pancreatic cancer Neg Hx     Allergies  Allergen Reactions   Tramadol Other (See Comments)    Reaction: NIGHTMARES Other reaction(s): Not available    Current Outpatient Medications on File Prior to Visit  Medication Sig Dispense Refill   alendronate (FOSAMAX) 70 MG tablet Take 1 tablet (70 mg total) by mouth every 7 (seven) days. Take with a full glass of water on an empty stomach. Avoid laying flat for 2 hours. 12 tablet 3   aspirin 81 MG tablet Take 81 mg by mouth daily.     atorvastatin (LIPITOR) 10 MG tablet Take 1 tablet (10 mg total) by mouth daily. 90 tablet 2   buPROPion ER (WELLBUTRIN SR) 100 MG 12 hr tablet Take 1 tablet (100 mg total) by mouth 2 (two) times daily for depression 180 tablet 1   Cholecalciferol (D3) 10 MCG  (400 UNIT) CHEW Chew by mouth.     DULoxetine (CYMBALTA) 60 MG capsule Take 1 capsule (60 mg total) by mouth daily for headache prevention 90 capsule 1   empagliflozin (JARDIANCE) 10 MG TABS tablet Take 1 tablet (10 mg) by mouth daily before breakfast. 90 tablet 3   furosemide (LASIX)  20 MG tablet Take 1 tablet (20 mg total) by mouth daily. 90 tablet 3   KLOR-CON M10 10 MEQ tablet Take 10 mEq by mouth as needed. With lasix.     lansoprazole (EQ LANSOPRAZOLE) 15 MG capsule Take 15 mg by mouth 2 (two) times daily.     levothyroxine (SYNTHROID) 75 MCG tablet Take 1 tablet (75 mcg total) by mouth in the morning on an empty stomach with water only. No food/other medications for 1/2 hour 90 tablet 2   magnesium 30 MG tablet Take 30 mg by mouth daily.     metoprolol succinate (TOPROL-XL) 50 MG 24 hr tablet Take 1 tablet (50 mg total) by mouth daily. Take with or immediately following a meal. 90 tablet 3   metoprolol tartrate (LOPRESSOR) 25 MG tablet Take one (1) tablet by mouth ( 25 mg) Heart Rate greater than 100 or palpitations. 30 tablet 6   mirtazapine (REMERON) 15 MG tablet TAKE 1 TABLET (15 MG TOTAL) BY MOUTH AT BEDTIME. FOR SLEEP 90 tablet 0   nitroGLYCERIN (NITROSTAT) 0.4 MG SL tablet Dissolve 1 tablet under the tongue every 5 minutes as needed for chest pain. Max of 3 doses, then 911. 25 tablet 6   sacubitril-valsartan (ENTRESTO) 97-103 MG Take 1 tablet by mouth 2 (two) times daily. 180 tablet 3   spironolactone (ALDACTONE) 25 MG tablet Take 1 tablet (25 mg total) by mouth daily. 90 tablet 3   tizanidine (ZANAFLEX) 2 MG capsule Take 2 mg by mouth 2 (two) times daily as needed for muscle spasms.     ferrous sulfate 325 (65 FE) MG EC tablet Take 1 tablet (325 mg total) by mouth daily. (Patient not taking: Reported on 06/12/2023) 90 tablet 1   torsemide (DEMADEX) 20 MG tablet Take 1 tablet (20 mg total) by mouth every Monday, Wednesday, and Friday. (Patient not taking: Reported on 07/31/2023) 36 tablet  3   No current facility-administered medications on file prior to visit.    BP 132/78   Pulse 90   Temp 97.6 F (36.4 C) (Temporal)   Ht 5' 4.5" (1.638 m)   Wt 252 lb 9.6 oz (114.6 kg)   SpO2 96%   BMI 42.69 kg/m  Objective:   Physical Exam Constitutional:      General: She is not in acute distress.    Appearance: She is not ill-appearing.  Cardiovascular:     Rate and Rhythm: Normal rate and regular rhythm.  Pulmonary:     Effort: Pulmonary effort is normal.     Breath sounds: Normal breath sounds.  Abdominal:     General: Bowel sounds are normal.     Palpations: Abdomen is soft.     Tenderness: There is abdominal tenderness in the left upper quadrant.       Comments: Mildly tender to left upper quadrant with moderate palpation  Musculoskeletal:     Cervical back: Neck supple.  Skin:    General: Skin is warm and dry.           Assessment & Plan:  Left upper quadrant abdominal pain Assessment & Plan: Fortunately, she is improving.  Differentials include H. pylori infection, ulcer, constipation. Lower suspicion for pancreatitis or colitis based on presentation. She does have history of diverticulosis.   Labs pending today for CBC with differential, lipase, BMP.  Cannot complete H. pylori testing today as she is on PPI BID regularly.  Discussed that we could test for H. pylori if she were to discontinue her  PPI for 2 weeks, she declines.  Checking abdominal plain films today. Consider CT scan if warranted.  Follow-up with GI as scheduled.  Orders: -     CBC with Differential/Platelet -     Lipase -     Basic metabolic panel  Iron deficiency anemia, unspecified iron deficiency anemia type Assessment & Plan: Off iron pills for about 3 months.  Repeat iron studies and CBC pending.  Orders: -     IBC + Ferritin        Doreene Nest, NP

## 2023-07-31 NOTE — Patient Instructions (Signed)
Stop by the lab and xray prior to leaving today. I will notify you of your results once received.   It was a pleasure to see you today!   

## 2023-07-31 NOTE — Assessment & Plan Note (Signed)
Off iron pills for about 3 months.  Repeat iron studies and CBC pending.

## 2023-08-09 ENCOUNTER — Other Ambulatory Visit: Payer: Self-pay | Admitting: Primary Care

## 2023-08-09 DIAGNOSIS — F32A Depression, unspecified: Secondary | ICD-10-CM

## 2023-08-09 DIAGNOSIS — G479 Sleep disorder, unspecified: Secondary | ICD-10-CM

## 2023-08-09 MED ORDER — VALSARTAN 320 MG PO TABS: 320.0000 mg | ORAL_TABLET | Freq: Every day | ORAL | 1 refills | Status: DC

## 2023-08-14 DIAGNOSIS — G479 Sleep disorder, unspecified: Secondary | ICD-10-CM

## 2023-08-21 ENCOUNTER — Other Ambulatory Visit (HOSPITAL_COMMUNITY): Payer: Self-pay

## 2023-08-21 MED ORDER — MIRTAZAPINE 30 MG PO TABS
30.0000 mg | ORAL_TABLET | Freq: Every day | ORAL | 0 refills | Status: DC
Start: 2023-08-21 — End: 2023-11-28
  Filled 2023-08-21: qty 90, 90d supply, fill #0

## 2023-08-27 ENCOUNTER — Other Ambulatory Visit (HOSPITAL_COMMUNITY): Payer: Self-pay

## 2023-09-23 ENCOUNTER — Other Ambulatory Visit (HOSPITAL_COMMUNITY): Payer: Self-pay

## 2023-09-24 ENCOUNTER — Telehealth: Payer: Self-pay | Admitting: Cardiovascular Disease

## 2023-09-24 NOTE — Telephone Encounter (Signed)
  Per MyChart scheduling message:  Initial Complaint: Medication, shortness of breath, dizziness   Pt c/o Shortness Of Breath: STAT if SOB developed within the last 24 hours or pt is noticeably SOB on the phone  1. Are you currently SOB (can you hear that pt is SOB on the phone)?   2. How long have you been experiencing SOB?   3. Are you SOB when sitting or when up moving around?   4. Are you currently experiencing any other symptoms?    Yes currently shortness of breath. Have been experiencing this for several months, but has gotten worse than the last two months   Shortness of breath occurs most all the time. Sometimes It seems to wake me up in the night. I have a cough that I've had for several months, but is also gotten worse   Everything I eat, makes me nauseous  My stomach feels swollen. I am currently taking a fluid pill every other day.  I feel extremely weak.  I have had to stop  my jardiance and entresto ( But I have gotten enough money together to get  entredto  filled today for one month

## 2023-09-27 NOTE — Telephone Encounter (Signed)
Returned call to patient, no answer, left message to call back. Asked that when she does to please report her weights. OV at PCP on 07/31/23 showed 252lb, prior weight here on 02/11/23 was 247lb. Wanted to clarify that she isn't taking both Valsartan and Entresto.

## 2023-10-09 NOTE — Telephone Encounter (Signed)
Attempted to reach patient again, had to leave additional voicemail and ask that she call office back. Awaiting return call.

## 2023-10-10 ENCOUNTER — Encounter: Payer: Self-pay | Admitting: Nurse Practitioner

## 2023-10-10 ENCOUNTER — Ambulatory Visit (INDEPENDENT_AMBULATORY_CARE_PROVIDER_SITE_OTHER): Payer: HMO | Admitting: Nurse Practitioner

## 2023-10-10 ENCOUNTER — Telehealth: Payer: Self-pay

## 2023-10-10 ENCOUNTER — Other Ambulatory Visit (HOSPITAL_COMMUNITY): Payer: Self-pay

## 2023-10-10 VITALS — BP 120/70 | HR 77 | Ht 64.5 in | Wt 249.4 lb

## 2023-10-10 DIAGNOSIS — K219 Gastro-esophageal reflux disease without esophagitis: Secondary | ICD-10-CM | POA: Diagnosis not present

## 2023-10-10 DIAGNOSIS — R1310 Dysphagia, unspecified: Secondary | ICD-10-CM

## 2023-10-10 DIAGNOSIS — Z860100 Personal history of colon polyps, unspecified: Secondary | ICD-10-CM | POA: Diagnosis not present

## 2023-10-10 MED ORDER — NA SULFATE-K SULFATE-MG SULF 17.5-3.13-1.6 GM/177ML PO SOLN
1.0000 | Freq: Once | ORAL | 0 refills | Status: AC
  Filled 2023-10-10: qty 354, 1d supply, fill #0

## 2023-10-10 NOTE — Patient Instructions (Addendum)
You have been scheduled for an endoscopy and colonoscopy. Please follow the written instructions given to you at your visit today.  Please pick up your prep supplies at the pharmacy within the next 1-3 days.  If you use inhalers (even only as needed), please bring them with you on the day of your procedure. ___________________________________________________________________________  Gaviscon- 1 tablespoon 3 times daily as needed (over thte counter)  Due to recent changes in healthcare laws, you may see the results of your imaging and laboratory studies on MyChart before your provider has had a chance to review them.  We understand that in some cases there may be results that are confusing or concerning to you. Not all laboratory results come back in the same time frame and the provider may be waiting for multiple results in order to interpret others.  Please give [place] 48 hours in order for your provider to thoroughly review all the results before contacting the office for clarification of your results.   Thank you for trusting me with your gastrointestinal care!   Alcide Evener, CRNP

## 2023-10-10 NOTE — Progress Notes (Signed)
10/10/2023 [name] [id number] [birth year]   Chief Complaint: Schedule and EGD and colonoscopy   History of Present Illness: Denise [name] is a 72 year old female with a past medical history of obesity, hypertension, diastolic CHF with LVEF 55 - 60%, LBBB, TIA, hypothyroidism, pituitary adenoma, GERD, ischemic colitis 01/2019, LLQ 01/2020 (suspected diverticulitis) and colon polyps. Past cholecystectomy. She is followed by Dr. Adela Lank.   I last saw patient in office 07/27/2022, office visit note at that time as follows:  She presents today with complaints of voice change (raspy voice), frequent throat clearing with difficulty swallowing. Food gets stuck in her throat or upper esophagus which eventually passes down the esophagus in 10 to 15 minutes which will occur more often if she eats too quickly. She has intermittent food regurgitation since Nov or Dec 2022. No nausea or vomiting. No heartburn. She takes Lansoprazole 15mg  QD. History of a hiatal hernia. She also has a lump sensation in her throat and saw an ENT, negative evaluation. She underwent a modified barium swallow with tablet study with speech pathology 07/02/2022 which was unrevealing. EGD in 2013 which showed a 2 cm hiatal hernia, a benign flat polyp 3-50mm was found in the middle third of the esophagus and gastritis. No evidence of H. Pylori. She is passing a normal brown stool every few days. She has intermittent constipation and takes a stool softener a two days weekly. No rectal bleeding or black stools. Her most recent colonoscopy was 06/15/2016, 3 tubular adenomatous, sessile serrated and hyperplastic polyps were removed from the colon. She was advised to repeat a colonoscopy in 5 years. She had LLQ pain, suspected diverticulitis treated with a course of Augmentin 01/2020. A colonoscopy was scheduled 03/2020 but was not done. No known family history of esophageal, gastric or colon cancer.    She's been experiencing  worsening SOB for the past few months with associated 25lb weight gain. She was seen by her cardiologist Dr. Melburn Popper on 07/16/2022. At that time, she was assessed to have acute on chronic diastolic CHF. She was started on Torsemide 20mg  daily, KCL QD, Entresto and Jardiance. Amlodipine was discontinued. She was tachycardic and a TSH level was normal at 2.210. Chest x-ray revealed diffuse infiltrates. Chest CTA scan 06/22/2022 was negative for PE, showed ground glass appearance to the parenchyma. ECHO showed LV EF 55 - 60% on 07/09/2022. A coronary CT was planned after her CHF and tachycardia improves.    She was seen by pulmonologist Dr. Cyril Mourning on 07/19/2022 to rule out interstitial lung disease. She lost 10 lbs after starting Torsemide but remained SOB.  Dr. Vassie Loll did not think she had ILD and no further pulmonary work up was recommended.    She followed up with Dr. Elease Hashimoto on 07/23/2022, weight was down 6lbs from prior visit. Her SOB improved and Torsemide was reduced to on M,W,F with KCL and Spironolactone 25mg  QD was added. Coronary CT was ordered.    Labs 06/12/2022 identified new onset anemia. Hg 11.2 down from 13.4. MCV 78.3. Normal platelet count. Elevated alk phos 174 with normal T. Bili, AST/ALT levels.    She takes Ibuprofen 200mg  one tab 2 to 3 days weekly for LBP x 2 to 3 years, stopped taking it one week ago. On ASA 81mg  daily.  An EGD and colonoscopy were ordered, to be scheduled after cardiac clearance received.   Cardiac clearance was received  by Dr. Elease Hashimoto 07/31/2022 as follows:  "She needs  EGD and colonoscopy. She is at low risk for her upcoming GI procedures"    Cardiac clearance 08/08/2022 was also provided for for her upcoming cataract surgery.    Updated cardiology clearance  by Dr. Elease Hashimoto received  09/04/2022 as follows:   "[name] is seen for follow up of her chronic diastilic CHF Has had anemia. She is been having some episodes of chest pain.  Coronary CT angiogram  performed on August 27, 2022 reveals a coronary calcium score of 0.  She has normal coronary arteries. She was started on Isosorbide but she stopped due to the severe headache    The GI appointments were not kept because of that her episode of chest pain.  We now have ruled out severe coronary artery disease.  She is cleared to have her colonoscopy and upper GI endoscopy for further evaluation of her GI bleeding".  Multiple attempts were made by our office 09/2022 to schedule and EGD and colonoscopy but the patient did not respond.   At some point, she reported an EGD and colonoscopy were scheduled but she stated these procedures were cancelled by the anesthesiologist in [facility]. Note, I cannot locate documentation to further clarify at this time.   Cardiac MR 01/30/2023 was normal, LV EF 56%.  She presents today to schedule an EGD and colonoscopy. She denies having any chest pain. She was having issues with SOB which improved when she was started on Entresto but when she stopped Entresto due to cost issues she was switched to Valsartan 01/2023 and had worsening SOB. She was subsequently restarted on Entresto and he SOB improved but she ran out of [facility] 08/2023 and had recurrent SOB. Since then, she was able to get a month worth of Entresto and her SOB has improved.   She continues to have heartburn despite taking Lansoprazole 15mg  po bid. She does not wish to increase the dose due to cost. She has globus sensation and dysphagia, "gets choked 8 out of 10 times". Food gets stuck to the upper esophagus, sometimes gags and the stuck food comes up and sometimes the stuck food passes down the esophagus after drinking water. No abdominal pain. No bloody or black stools. She had diarrhea x 3 weeks 08/2023 which abated after taking Imodium. She is taking Ferrous Sulfate 325mg  one capsule po every day.     Latest Ref Rng & Units 07/31/2023    8:30 AM 05/16/2023    2:39 PM 12/25/2022    3:11 PM  CBC  WBC 4.0 -  10.5 K/uL 9.9  9.5    Hemoglobin 12.0 - 15.0 g/dL 16.1  09.6  04.5   Hematocrit 36.0 - 46.0 % 40.8  46.7  45.5   Platelets 150.0 - 400.0 K/uL 341.0  349    Iron 81 and ferritin 34.8 on 07/31/2023.      Latest Ref Rng & Units 07/31/2023    8:30 AM 12/17/2022    9:27 PM 12/14/2022    2:54 PM  CMP  Glucose 70 - 99 mg/dL 409  811  914   BUN 6 - 23 mg/dL 15  15  22    Creatinine 0.40 - 1.20 mg/dL 7.82  9.56  2.13   Sodium 135 - 145 mEq/L 137  140  140   Potassium 3.5 - 5.1 mEq/L 3.9  3.9  4.7   Chloride 96 - 112 mEq/L 103  107  104   CO2 19 - 32 mEq/L 25  24  27    Calcium 8.4 -  10.5 mg/dL 9.3  8.5  9.8   Total Protein 6.5 - 8.1 g/dL  7.1    Total Bilirubin 0.3 - 1.2 mg/dL  0.4    Alkaline Phos 38 - 126 U/L  188    AST 15 - 41 U/L  20    ALT 0 - 44 U/L  15     GGT 16 on 07/28/2023  IMAGE STUDIES:   Chest Xray 06/12/2022: 1. Borderline cardiomegaly again noted. No pulmonary venous congestion.   2. Bilateral pulmonary interstitial prominence is again noted. Similar findings noted on prior study of 08/11/2021. Although these changes may be chronic an active interstitial process including pneumonitis cannot be excluded   Chest CTA 06/22/2022: 1.  No evidence of pulmonary embolism.  2.  Mild cardiomegaly.  3. Mild ground-glass attenuation of the lung parenchyma, nonspecific finding could be secondary to air trapping or pulmonary vascular congestion. There is no focal consolidation or frank pulmonary edema.  Aortic Atherosclerosis    Barium swallow study 07/02/2022: Real-time fluoroscopy of the swallowing function was performed with a speech pathologist present.   Multiple consistencies of barium were administered which included water, nectar, pudding, and Graham cracker. A 13 mm barium sulfate tablet was also given.   Normal swallow function. No laryngeal penetration or tracheal aspiration. A 13 mm barium sulfate tablet was given which transited through the esophagus and  esophageal gastric junction without delay.   ECHO 07/09/2022: IMPRESSIONS Left ventricular ejection fraction, by estimation, is 55 to 60%. The left ventricle has normal function. The left ventricle has no regional wall motion abnormalities. Left ventricular diastolic parameters are indeterminate. 1. 2. Right ventricular systolic function is normal. The right ventricular size is normal. The mitral valve is normal in structure. No evidence of mitral valve regurgitation. No evidence of mitral stenosis. 3. The aortic valve is normal in structure. Aortic valve regurgitation is not visualized. No aortic stenosis is present. 4. 5. There is mild dilatation of the ascending aorta, measuring 37 mm. The inferior vena cava is normal in size with greater than 50% respiratory variability, suggesting right atrial pressure of 3 mmHg.  Coronary CT 08/27/2022: IMPRESSION: 1. No evidence of CAD, 0% stenosis, CADRADS 0.   2. Coronary calcium score of 0.   3. Normal coronary origins with right dominance.   RECOMMENDATIONS: CAD-RADS 0. No evidence of CAD (0%). Consider non-atherosclerotic causes of chest pain.  MR Cardiac Morphology 01/30/2023: IMPRESSION: 1.  Normal LV size and function.  LVEF 56%.   2.  No myocardial LGE or scar.   3. Normal native T1 and ECV values, no evidence for myocardial infiltration or inflammation.   4.  Normal RV size and function.   5.  No evidence for myocarditis.    PAST GI PROCEDURES:   Colonoscopy 06/15/2016: - One 3 mm polyp in the cecum, removed with a cold biopsy forceps. Resected and retrieved. - Two 4 to 5 mm polyps in the ascending colon, removed with a cold snare. Resected and retrieved. - One 4 mm polyp in the rectum, removed with a cold snare. Resected and retrieved. - Diverticulosis in the sigmoid colon. - Non-bleeding internal hemorrhoids. - The examination was otherwise normal. TUBULAR ADENOMA, SESSILE SERRATED POLYP/ADENOMA AND HYPERPLASTIC  COLONIC POLYPS.   EGD 09/10/2012: Flat poly 3 - 5 mm found in the middle third of the esophagus 2cm hiatal hernia Gastritis in the gastric antrum 1. Surgical [P], gastric, antrum, bx - BENIGN ANTRAL MUCOSA. - NEGATIVE FOR H. PYLORI, DYSPLASIA OR  MALIGNANCY. 2. Surgical [P], GE junction, bx - CHRONICALLY INFLAMED GE JUNCTIONAL MUCOSA. - NEGATIVE FOR H. PYLORI, SEROUS MUCOSA, DYSPLASIA OR MALIGNANCY. 3. Surgical [P], polyp, 20 cm, bx - BENIGN POLYPOID CHRONICALLY INFLAMED SQUAMOUS MUCOSA WITH UNDERLYING LYMPHOID AGGREGATE. - NEGATIVE FOR DYSPLASIA OR MALIGNANCY.   Past Medical History:  Diagnosis Date   Acute non-recurrent sinusitis 06/12/2023   Allergy    seasonal   Arthritis    Bursitis of hip    Left   CHF (congestive heart failure) (HCC)    Colitis 01/26/2019   Constipation    90%of the time c/o constipation, occ has diarrhea - uses stool softener    Endometriosis    Facial flushing 05/12/2014   Fibroid    GERD (gastroesophageal reflux disease)    Hematochezia    Hiatal hernia    HSV-1 (herpes simplex virus 1) infection    Hyperlipidemia    Hyperplastic colon polyp    Hypertension    Hypothyroidism    Ischemic colitis (HCC) 01/2019   LBBB (left bundle branch block)    Low back pain    Migraine    Osteopenia 2008   -1.2 FEMORAL NECK   Overdose of benzodiazepine 08/11/2021   Prediabetes    Stroke [facility])    TIA   TIA (transient ischemic attack) 08/10/2013   Past Surgical History:  Procedure Laterality Date   ABDOMINAL HYSTERECTOMY  1989   TAH, BSO   CHOLECYSTECTOMY  2003   COLONOSCOPY     KNEE SURGERY  2004   right   NASAL SEPTUM SURGERY  2012   PITUITARY SURGERY  2001   POLYPECTOMY     SHOULDER SURGERY  2007   right   THYROID LOBECTOMY  2002   Current Outpatient Medications on File Prior to Visit  Medication Sig Dispense Refill   alendronate (FOSAMAX) 70 MG tablet Take 1 tablet (70 mg total) by mouth every 7 (seven) days. Take with a full glass of  water on an empty stomach. Avoid laying flat for 2 hours. 12 tablet 3   aspirin 81 MG tablet Take 81 mg by mouth daily.     atorvastatin (LIPITOR) 10 MG tablet Take 1 tablet (10 mg total) by mouth daily. 90 tablet 2   buPROPion ER (WELLBUTRIN SR) 100 MG 12 hr tablet Take 1 tablet (100 mg total) by mouth 2 (two) times daily for depression 180 tablet 1   Cholecalciferol (D3) 10 MCG (400 UNIT) CHEW Chew by mouth.     DULoxetine (CYMBALTA) 60 MG capsule Take 1 capsule (60 mg total) by mouth daily for headache prevention 90 capsule 1   empagliflozin (JARDIANCE) 10 MG TABS tablet Take 1 tablet (10 mg) by mouth daily before breakfast. 90 tablet 3   ferrous sulfate 325 (65 FE) MG EC tablet Take 1 tablet (325 mg total) by mouth daily. 90 tablet 1   furosemide (LASIX) 20 MG tablet Take 1 tablet (20 mg total) by mouth daily. 90 tablet 3   KLOR-CON M10 10 MEQ tablet Take 10 mEq by mouth as needed. With lasix.     lansoprazole (EQ LANSOPRAZOLE) 15 MG capsule Take 15 mg by mouth 2 (two) times daily.     levothyroxine (SYNTHROID) 75 MCG tablet Take 1 tablet (75 mcg total) by mouth in the morning on an empty stomach with water only. No food/other medications for 1/2 hour 90 tablet 2   magnesium 30 MG tablet Take 30 mg by mouth daily.     metoprolol succinate (  TOPROL-XL) 50 MG 24 hr tablet Take 1 tablet (50 mg total) by mouth daily. Take with or immediately following a meal. 90 tablet 3   metoprolol tartrate (LOPRESSOR) 25 MG tablet Take one (1) tablet by mouth ( 25 mg) Heart Rate greater than 100 or palpitations. 30 tablet 6   mirtazapine (REMERON) 30 MG tablet Take 1 tablet (30 mg total) by mouth at bedtime for sleep. 90 tablet 0   Multiple Minerals-Vitamins (CALCIUM-MAGNESIUM-ZINC-D3 PO) Take 1 capsule by mouth daily.     nitroGLYCERIN (NITROSTAT) 0.4 MG SL tablet Dissolve 1 tablet under the tongue every 5 minutes as needed for chest pain. Max of 3 doses, then 911. 25 tablet 6   sacubitril-valsartan (ENTRESTO)  97-103 MG Take 1 tablet by mouth 2 (two) times daily. 180 tablet 3   spironolactone (ALDACTONE) 25 MG tablet Take 1 tablet (25 mg total) by mouth daily. 90 tablet 3   tizanidine (ZANAFLEX) 2 MG capsule Take 2 mg by mouth 2 (two) times daily as needed for muscle spasms.     torsemide (DEMADEX) 20 MG tablet Take 1 tablet (20 mg total) by mouth every Monday, Wednesday, and Friday. 36 tablet 3   valsartan (DIOVAN) 320 MG tablet Take 1 tablet (320 mg total) by mouth daily. 90 tablet 1   No current facility-administered medications on file prior to visit.   Allergies  Allergen Reactions   Tramadol Other (See Comments)    Reaction: NIGHTMARES Other reaction(s): Not available   Current Medications, Allergies, Past Medical History, Past Surgical History, Family History and Social History were reviewed in [employer] record.  Review of Systems:   Constitutional: Negative for fever, sweats, chills or weight loss.  Respiratory: See HPI. Cardiovascular: See HPI. Gastrointestinal: See HPI.  Musculoskeletal: Negative for back pain or muscle aches.  Neurological: Negative for dizziness, headaches or paresthesias.   Physical Exam: BP 120/70 (BP Location: Left Arm, Patient Position: Sitting, Cuff Size: Large)   Pulse 77   Ht 5' 4.5" (1.638 m)   Wt 249 lb 6 oz (113.1 kg)   SpO2 95%   BMI 42.14 kg/m   General: 72 year old female in no acute distress. Head: Normocephalic and atraumatic. Eyes: No scleral icterus. Conjunctiva pink . Ears: Normal auditory acuity. Mouth: Dentition intact. No ulcers or lesions.  Lungs: Clear throughout to auscultation. Heart: Regular rate and rhythm, no murmur. Abdomen: Soft, nontender and nondistended. No masses or hepatomegaly. Normal bowel sounds x 4 quadrants.  Rectal: Deferred.  Musculoskeletal: Symmetrical with no gross deformities. Extremities: No edema. Neurological: Alert oriented x 4. No focal deficits.  Psychological: Alert and  cooperative. Normal mood and affect  Assessment and Recommendations:  71 year old female with dysphagia, voice change, globus sensation and regurgitation. Normal modified barium swallow study with tablet. EGD in 2013 showed a 2 cm hiatal hernia, a benign flat polyp 3-56mm was found in the middle third of the esophagus and gastritis. No evidence of H. Pylori. Chronic NSAID use.  -EGD benefits and risks discussed including risk with sedation, risk of bleeding, perforation and infection. Cardiac clearance required prior to the patient proceeding with an EGD -Avoid eating large pieces of bread/meat, cut food into small pieces and chew food thoroughly  -Continue Lansoprazole 15mg  po bid -Gaviscon 1 tablespoon po tid PRN -Avoid Ibuprofen use   History of colon polyps.  Three tubular adenomatous, sessile serrated and hyperplastic polyps were removed from the colon and sigmoid diverticulosis identified per colonoscopy 05/2016. History of  ischemic colitis 01/2019, presumed diverticulitis 01/2020. -Colonoscopy benefits and risks discussed including risk with sedation, risk of bleeding, perforation and infection. Updated cardiac clearance required prior to the patient proceeding with a colonoscopy.    Iron deficiency anemia. Hg 11.2 -> 13.2. No overt GI bleeding. Chronic NSAID use. On oral iron.  -EGD and colonoscopy as ordered above    SOB secondary to acute on chronic diastolic CHF, suspect iron deficiency anemia a contributing factor. On and off Entreso. Normal coronary calcium CT and coronary MRI with LV EF 56%.  -Updated cardiac clearance required prior to proceeding with an EGD and colonoscopy   Elevated Alk phos with normal T.  Bili, AST, ALT and GGT levels -Follow up with PCP, elevated alk phos possibly from bone

## 2023-10-10 NOTE — Telephone Encounter (Signed)
Pt has been scheduled for in office appt for pre op clearance at the [facility] office with Edd Fabian, FNP. I will update all parties involved.

## 2023-10-10 NOTE — Telephone Encounter (Signed)
[facility] [address] [place], [place]  [address] Phone:  [phone]   Fax:  [phone]   Denise [name] DOB: Mar 23, 1951 MRN: [id number]  Dear: Dr.Nasher:   The patient above is schedule for a Colonoscopy & Endoscopy on 10/31/23 under general anesthesia (Propofol).    Please fax or route a note of Medical Clearance to [phone], Attn: Rendi Mapel, CMA  Please advise if this patient will require an office visit or further medical work-up before clearance can be given.   Thank you,    [facility]

## 2023-10-10 NOTE — Telephone Encounter (Signed)
Primary Cardiologist:Philip [name], MD  Chart reviewed as part of pre-operative protocol coverage. Because of Denise [name]'s past medical history and time since last visit, he/she will require a follow-up visit in order to better assess preoperative cardiovascular risk.  Pre-op covering staff: - Please schedule appointment and call patient to inform them. - Please contact requesting surgeon's office via preferred method (i.e, phone, fax) to inform them of need for appointment prior to surgery.  No request to hold cardiac medications.   Levi Aland, NP-C  10/10/2023, 11:09 AM [address] Office [phone] Fax [phone]

## 2023-10-12 NOTE — Progress Notes (Signed)
Agree with assessment and plan as outlined.  I think she is okay to proceed at the [facility] given her cardiac workup to date, however will confirm with Cathlyn Parsons

## 2023-10-13 NOTE — Progress Notes (Deleted)
Cardiology Clinic Note   Patient Name: Denise [name] Date of Encounter: 10/13/2023  Primary Care Provider:  Doreene Nest, NP Primary Cardiologist:  Kristeen Miss, MD  Patient Profile    ***  Past Medical History    Past Medical History:  Diagnosis Date   Acute non-recurrent sinusitis 06/12/2023   Allergy    seasonal   Arthritis    Bursitis of hip    Left   CHF (congestive heart failure) (HCC)    Colitis 01/26/2019   Constipation    90%of the time c/o constipation, occ has diarrhea - uses stool softener    Endometriosis    Facial flushing 05/12/2014   Fibroid    GERD (gastroesophageal reflux disease)    Hematochezia    Hiatal hernia    HSV-1 (herpes simplex virus 1) infection    Hyperlipidemia    Hyperplastic colon polyp    Hypertension    Hypothyroidism    Ischemic colitis (HCC) 01/2019   LBBB (left bundle branch block)    Low back pain    Migraine    Osteopenia 2008   -1.2 FEMORAL NECK   Overdose of benzodiazepine 08/11/2021   Prediabetes    Stroke [facility])    TIA   TIA (transient ischemic attack) 08/10/2013   Past Surgical History:  Procedure Laterality Date   ABDOMINAL HYSTERECTOMY  1989   TAH, BSO   CHOLECYSTECTOMY  2003   COLONOSCOPY     KNEE SURGERY  2004   right   NASAL SEPTUM SURGERY  2012   PITUITARY SURGERY  2001   POLYPECTOMY     SHOULDER SURGERY  2007   right   THYROID LOBECTOMY  2002    Allergies  Allergies  Allergen Reactions   Tramadol Other (See Comments)    Reaction: NIGHTMARES Other reaction(s): Not available    History of Present Illness    ***  Preop-    Primary Cardiologist: Kristeen Miss, MD  Chart reviewed as part of pre-operative protocol coverage. Given past medical history and time since last visit, based on ACC/AHA guidelines, Denise [name] would be at acceptable risk for the planned procedure without further cardiovascular testing.   Patient was advised that if he/she*** develops new  symptoms prior to surgery to contact our office to arrange a follow-up appointment.  He verbalized understanding.  I will route this recommendation to the requesting party via Epic fax function and remove from pre-op pool.      Home Medications    Prior to Admission medications   Medication Sig Start Date End Date Taking? Authorizing Provider  alendronate (FOSAMAX) 70 MG tablet Take 1 tablet (70 mg total) by mouth every 7 (seven) days. Take with a full glass of water on an empty stomach. Avoid laying flat for 2 hours. 02/12/23   Doreene Nest, NP  aspirin 81 MG tablet Take 81 mg by mouth daily.    [provider]  atorvastatin (LIPITOR) 10 MG tablet Take 1 tablet (10 mg total) by mouth daily. 10/07/22   Doreene Nest, NP  buPROPion ER [facility] SR) 100 MG 12 hr tablet Take 1 tablet (100 mg total) by mouth 2 (two) times daily for depression 04/04/23   Doreene Nest, NP  Cholecalciferol (D3) 10 MCG (400 UNIT) CHEW Chew by mouth.    [provider]  DULoxetine (CYMBALTA) 60 MG capsule Take 1 capsule (60 mg total) by mouth daily for headache prevention 04/04/23   Vernona Rieger  K, NP  empagliflozin (JARDIANCE) 10 MG TABS tablet Take 1 tablet (10 mg) by mouth daily before breakfast. 01/04/23   [name], Deloris Ping, MD  ferrous sulfate 325 (65 FE) MG EC tablet Take 1 tablet (325 mg total) by mouth daily. 02/10/23   Doreene Nest, NP  furosemide (LASIX) 20 MG tablet Take 1 tablet (20 mg total) by mouth daily. 12/14/22   Swinyer, Zachary George, NP  KLOR-CON M10 10 MEQ tablet Take 10 mEq by mouth as needed. With lasix. 12/08/22   [provider]  lansoprazole (EQ LANSOPRAZOLE) 15 MG capsule Take 15 mg by mouth 2 (two) times daily.    [provider]  levothyroxine (SYNTHROID) 75 MCG tablet Take 1 tablet (75 mcg total) by mouth in the morning on an empty stomach with water only. No food/other medications for 1/2 hour 10/07/22   Doreene Nest, NP   magnesium 30 MG tablet Take 30 mg by mouth daily.    [provider]  metoprolol succinate (TOPROL-XL) 50 MG 24 hr tablet Take 1 tablet (50 mg total) by mouth daily. Take with or immediately following a meal. 01/04/23   [name], Deloris Ping, MD  metoprolol tartrate (LOPRESSOR) 25 MG tablet Take one (1) tablet by mouth ( 25 mg) Heart Rate greater than 100 or palpitations. 12/25/22   Swinyer, Zachary George, NP  mirtazapine (REMERON) 30 MG tablet Take 1 tablet (30 mg total) by mouth at bedtime for sleep. 08/21/23   Doreene Nest, NP  Multiple Minerals-Vitamins (CALCIUM-MAGNESIUM-ZINC-D3 PO) Take 1 capsule by mouth daily.    [provider]  nitroGLYCERIN (NITROSTAT) 0.4 MG SL tablet Dissolve 1 tablet under the tongue every 5 minutes as needed for chest pain. Max of 3 doses, then 911. 08/08/22   [name], Deloris Ping, MD  sacubitril-valsartan (ENTRESTO) 97-103 MG Take 1 tablet by mouth 2 (two) times daily. 01/15/23   [name], Deloris Ping, MD  spironolactone (ALDACTONE) 25 MG tablet Take 1 tablet (25 mg total) by mouth daily. 04/04/23   [name], Deloris Ping, MD  tizanidine (ZANAFLEX) 2 MG capsule Take 2 mg by mouth 2 (two) times daily as needed for muscle spasms. 12/16/22   [provider]  torsemide (DEMADEX) 20 MG tablet Take 1 tablet (20 mg total) by mouth every Monday, Wednesday, and Friday. 07/23/22   [name], Deloris Ping, MD  valsartan (DIOVAN) 320 MG tablet Take 1 tablet (320 mg total) by mouth daily. 08/09/23   [name], Deloris Ping, MD    Family History    Family History  Problem Relation Age of Onset   Cancer Mother        brain tumor   Heart disease Father    Parkinson's disease Sister    Breast cancer Maternal Aunt    Colon cancer Neg Hx    Colon polyps Neg Hx    Rectal cancer Neg Hx    Stomach cancer Neg Hx    Thyroid disease Neg Hx    Esophageal cancer Neg Hx    Pancreatic cancer Neg Hx    She indicated that her mother is deceased. She indicated that her father is deceased. She  indicated that the status of her sister is unknown. She indicated that the status of her maternal aunt is unknown. She indicated that the status of her neg hx is unknown.  Social History    Social History   Socioeconomic History   Marital status: Divorced    Spouse name: Not on file   Number  of children: 2   Years of education: Not on file   Highest education level: 12th grade  Occupational History    Employer: RETIRED  Tobacco Use   Smoking status: Never    Passive exposure: Past   Smokeless tobacco: Never  Vaping Use   Vaping status: Never Used  Substance and Sexual Activity   Alcohol use: Yes    Alcohol/week: 0.0 standard drinks of alcohol    Comment: 1 every 3 months   Drug use: No   Sexual activity: Not Currently    Comment: intercourse age 86, more than 5 sexual partners,des neg  Other Topics Concern   Not on file  Social History Narrative   Single.   2 daughters, 4 grandchildren.   Once worked for [employer].   Enjoys Catering manager.    Social Determinants of Health   Financial Resource Strain: Low Risk  (07/31/2023)   Overall Financial Resource Strain (CARDIA)    Difficulty of Paying Living Expenses: Not very hard  Food Insecurity: Food Insecurity Present (07/31/2023)   Hunger Vital Sign    Worried About Running Out of Food in the Last Year: Never true    Ran Out of Food in the Last Year: Sometimes true  Transportation Needs: No Transportation Needs (07/31/2023)   PRAPARE - Administrator, Civil Service (Medical): No    Lack of Transportation (Non-Medical): No  Physical Activity: Unknown (07/31/2023)   Exercise Vital Sign    Days of Exercise per Week: 0 days    Minutes of Exercise per Session: Not on file  Stress: Stress Concern Present (07/31/2023)   [employer] of Occupational Health - Occupational Stress Questionnaire    Feeling of Stress : To some extent  Social Connections: Socially Isolated (07/31/2023)    Social Connection and Isolation Panel [NHANES]    Frequency of Communication with Friends and Family: Once a week    Frequency of Social Gatherings with Friends and Family: Once a week    Attends Religious Services: 1 to 4 times per year    Active Member of [employer] or Organizations: No    Attends Engineer, structural: Not on file    Marital Status: Divorced  Intimate Partner Violence: Not on file     Review of Systems    General:  No chills, fever, night sweats or weight changes.  Cardiovascular:  No chest pain, dyspnea on exertion, edema, orthopnea, palpitations, paroxysmal nocturnal dyspnea. Dermatological: No rash, lesions/masses Respiratory: No cough, dyspnea Urologic: No hematuria, dysuria Abdominal:   No nausea, vomiting, diarrhea, bright red blood per rectum, melena, or hematemesis Neurologic:  No visual changes, wkns, changes in mental status. All other systems reviewed and are otherwise negative except as noted above.  Physical Exam    VS:  There were no vitals taken for this visit. , BMI There is no height or weight on file to calculate BMI. GEN: Well nourished, well developed, in no acute distress. HEENT: normal. Neck: Supple, no JVD, carotid bruits, or masses. Cardiac: RRR, no murmurs, rubs, or gallops. No clubbing, cyanosis, edema.  Radials/DP/PT 2+ and equal bilaterally.  Respiratory:  Respirations regular and unlabored, clear to auscultation bilaterally. GI: Soft, nontender, nondistended, BS + x 4. MS: no deformity or atrophy. Skin: warm and dry, no rash. Neuro:  Strength and sensation are intact. Psych: Normal affect.  Accessory Clinical Findings    Recent Labs: 12/14/2022: NT-Pro BNP 120 12/17/2022: ALT 15 01/01/2023: TSH 3.09 05/16/2023: Brain  Natriuretic Peptide 26 07/31/2023: BUN 15; Creatinine, Ser 1.06; Hemoglobin 13.2; Platelets 341.0; Potassium 3.9; Sodium 137   Recent Lipid Panel    Component Value Date/Time   CHOL 110 07/12/2022 1236   TRIG  163.0 (H) 07/12/2022 1236   HDL 42.60 07/12/2022 1236   CHOLHDL 3 07/12/2022 1236   VLDL 32.6 07/12/2022 1236   LDLCALC 34 07/12/2022 1236   LDLDIRECT 72.0 06/23/2021 1245    No BP recorded.  {Refresh Note OR Click here to enter BP  :1}***    ECG personally reviewed by me today- ***          Assessment & Plan   1.  ***   Thomasene Ripple. Aseneth Hack NP-C     10/13/2023, 1:05 PM [facility] [address] Office [phone] Fax [phone]    I spent***minutes examining this patient, reviewing medications, and using patient centered shared decision making involving her cardiac care.   I spent greater than 20 minutes reviewing her past medical history,  medications, and prior cardiac tests.

## 2023-10-14 NOTE — Progress Notes (Signed)
Denise [name] I have reviewed her prior cardiac workup with Cathlyn Parsons who approved her case to be done at the [facility].   If she is asymptomatic I think okay to proceed. If she is having cardiopulmonary symptoms that are new or concerning for her however and she should be evaluated by her cardiologist first, we would then need to postpone her procedure to another date.

## 2023-10-14 NOTE — Progress Notes (Signed)
Dr. Adela Lank, EGD/colonoscopy scheduled 10/31/2023. Please verify if you want me to cancel the cardiac clearance which was ordered at the time of the patient's office visit and she is currently scheduled for an appointment with her cardiology APP on 11/05/2023.

## 2023-10-15 ENCOUNTER — Ambulatory Visit: Payer: HMO | Admitting: General Practice

## 2023-10-15 ENCOUNTER — Telehealth: Payer: Self-pay

## 2023-10-15 NOTE — Progress Notes (Signed)
Denise [name], pls contact patient and let her know Dr. Adela Lank and Cathlyn Parsons CRNA reviewed her case. If patient is not having any further SOB and no chest pain, she can proceed with her EGD and colonoscopy 10/31/2023 in [facility] as scheduled and cardiac clearance requested at the time of her office visit can be cancelled.   However, if she is experiencing any SOB or chest pain then she will need to proceed with her cardiology appt scheduled 11/05/2023 for cardiac clearance and her EGD and colonoscopy will need to be rescheduled to a later date once cardiac clearance received. Please let me know outcome. THX.

## 2023-10-15 NOTE — Telephone Encounter (Signed)
Pt made aware of Dr. Adela Lank and Cathlyn Parsons CRNA recommendations: Pt stated that she has had no recent shortness of breath or chest pain although she would like to rescheduled her procedure date and that she still would just like to follow up with Cardiology since its been a while since she seen them.  Pt procedure was rescheduled to 12/04/2023 at 10:00 AM. Pt made aware. New prep instructions were sent to pt via my chart.  Pt made aware Pt verbalized understanding with all questions answered.  Routed as FYI:

## 2023-10-15 NOTE — Telephone Encounter (Signed)
Message Received: Today Arnaldo Natal, NP  Emeline Darling, RN      Previous Messages  Routed Note  Author: Arnaldo Natal, NP Service: Gastroenterology Author Type: Nurse Practitioner  Filed: 10/15/2023  5:30 AM Encounter Date: 10/10/2023 Status: Signed  Editor: Arnaldo Natal, NP (Nurse Practitioner)  Viviann Spare, pls contact patient and let her know Dr. Adela Lank and Cathlyn Parsons CRNA reviewed her case. If patient is not having any further SOB and no chest pain, she can proceed with her EGD and colonoscopy 10/31/2023 in [facility] as scheduled and cardiac clearance requested at the time of her office visit can be cancelled.    However, if she is experiencing any SOB or chest pain then she will need to proceed with her cardiology appt scheduled 11/05/2023 for cardiac clearance and her EGD and colonoscopy will need to be rescheduled to a later date once cardiac clearance received. Please let me know outcome. THX.      Office Visit for Endoscopy; Abdominal Pain; Diarrhea; Hiatal Hernia; Dysphagia 10/10/2023 Arnaldo Natal, NP - [facility] Diagnoses  Gastroesophageal Reflux Disease Without Esophagitis (Primary) Dysphagia, unspecified type History of colon polyps Orders Signed This Visit  (2) Na Sulfate-K Sulfate-Mg Sulf 17.5-3.13-1.6 GM/177ML SOLN Ambulatory referral to Gastroenterology Orders Pended This Visit  None Progress Notes  Arnaldo Natal, NP at 10/10/2023 10:00 AM  Status: Signed  Viviann Spare, pls contact patient and let her know Dr. Adela Lank and Cathlyn Parsons CRNA reviewed her case. If patient is not having any further SOB and no chest pain, she can proceed with her EGD and colonoscopy 10/31/2023 in [facility] as scheduled and cardiac clearance requested at the time of her office visit can be cancelled.    However, if she is experiencing any SOB or chest pain then she will need to proceed with her cardiology appt  scheduled 11/05/2023 for cardiac clearance and her EGD and colonoscopy will need to be rescheduled to a later date once cardiac clearance received. Please let me know outcome. THX.     Benancio Deeds, MD at 10/10/2023 10:00 AM  Status: Signed  Colleen I have reviewed her prior cardiac workup with Cathlyn Parsons who approved her case to be done at the [facility].    If she is asymptomatic I think okay to proceed. If she is having cardiopulmonary symptoms that are new or concerning for her however and she should be evaluated by her cardiologist first, we would then need to postpone her procedure to another date.      Arnaldo Natal, NP at 10/10/2023 10:00 AM  Status: Signed  Dr. Adela Lank, EGD/colonoscopy scheduled 10/31/2023. Please verify if you want me to cancel the cardiac clearance which was ordered at the time of the patient's office visit and she is currently scheduled for an appointment with her cardiology APP on 11/05/2023.      Benancio Deeds, MD at 10/10/2023 10:00 AM  Status: Signed  Agree with assessment and plan as outlined.  I think she is okay to proceed at the [facility] given her cardiac workup to date, however will confirm with Corinne Ports, NP at 10/10/2023 10:00 AM

## 2023-10-16 NOTE — Telephone Encounter (Signed)
Dr. Adela Lank, Lorain Childes, see note below.

## 2023-10-16 NOTE — Telephone Encounter (Signed)
Okay that is fine, thanks for letting me know ?

## 2023-10-18 NOTE — Telephone Encounter (Signed)
Third and final attempt to reach patient was unsuccessful. Left message advising her that I would close this encounter and if she was still having issues or needed assistance to call us back.

## 2023-10-21 ENCOUNTER — Other Ambulatory Visit (HOSPITAL_COMMUNITY): Payer: Self-pay

## 2023-10-31 ENCOUNTER — Encounter: Payer: HMO | Admitting: Gastroenterology

## 2023-10-31 ENCOUNTER — Other Ambulatory Visit (HOSPITAL_COMMUNITY): Payer: Self-pay

## 2023-11-02 NOTE — Progress Notes (Unsigned)
Cardiology Office Note:  .   Date:  11/05/2023  ID:  Denise [name], DOB 14-Sep-1951, MRN [id number] PCP: Denise Nest, NP   [facility]Care Providers Cardiologist:  Denise Miss, MD    Patient Profile: .      PMH Left bundle branch block Coronary calcium score of 0 Chronic HFpEF Obesity Iron deficiency anemia Hypertension Family history CAD Father - MI mid 76s Grandmother - stroke GERD TIA  Referred to cardiology and seen by Dr. Elease [name] for evaluation of shortness of breath on 07/16/2022. Had recent TTE 07/09/22 that revealed normal LVEF 55-60%, no rwma, indeterminate diastolic parameters, and only trivial.  She reported leg edema and running out of breath while walking and dressing, as well as two-pillow orthopnea. Symptoms felt to be secondary to HFpEF and she was started on GDMT over the course of several office visits including spironolactone, Jardiance, Entresto, and metoprolol.  She did not feel much improvement on Lasix and was subsequently switched to torsemide. She had difficulty affording Entresto and during times of financial burden, she was on valsartan.  She underwent coronary CTA 08/28/2022 which revealed no evidence of CAD and coronary calcium score of 0.  7-day ZIO monitor revealed predominant rhythm sinus rhythm, 1 run of SVT lasting 8 beats.  Symptoms persisted despite good medical therapy and she underwent cardiac MR on 01/30/2023 which revealed normal LV size and function with LVEF 56%, no myocardial LGE or scar, no evidence for myocardial inflammation or infiltration, no evidence for myocarditis, normal RV size and function.  She reported significant weight gain since she had retired for a total of 100 lbs over the course of a couple of years.  She was found to be anemic and was started on iron.  She continued to feel fatigued but orthopnea improved.  Last cardiology clinic visit was 02/11/2023 with Dr. Elease [name].  She reported difficulty exercising due to back  issues.  She did not appear volume overloaded and no medication changes were made.  She was advised to return in 1 year for follow-up.  She contacted our office 09/24/2023 to report shortness of breath that had worsened over the previous 2 months.  She was also feeling abdominal distention and nausea she had stopped Jardiance and Entresto due to cost but was planning to get Entresto filled for 1 month. Our nursing team could not reach her to triage her symptoms.        History of Present Illness: .   Denise [name] is a very pleasant 72 y.o. female who is here today for follow-up.  She reports she has decided to postpone colonoscopy and endoscopy until she is feeling better. She is concerned about fluid management and asks about diuretics - she has both torsemide and furosemide but is unsure about the appropriate timing. She reports occasional chest pains after taking her diuretic medication. Feels short "twinges" after taking medication that does not feel like a burning sensation. Feels that she has fluid retention in her abdomen. Occasional leg and arm swelling. Has difficulty with stairs due to breathlessness and hip pain. No orthopnea or PND. Gets out of breath with walking at times. Has had financial difficulty getting Entresto and [place] and has stopped them intermittently. Recently resume Entresto but high out-of-pocket cost, not currently on Jardiance. Reports fluctuations in weight, which she monitors but not daily.  Admits she eats out frequently which she acknowledges may be high in sodium. She expresses a desire to improve her diet and is open  to participating in an exercise program. No presyncope or syncope  Discussed the use of AI scribe software for clinical note transcription with the patient, who gave verbal consent to proceed.   ROS: See HPI       Studies Reviewed: Marland Kitchen   EKG Interpretation Date/Time:  Tuesday November 05 2023 08:22:11 EST Ventricular Rate:  91 PR  Interval:  176 QRS Duration:  136 QT Interval:  394 QTC Calculation: 484 R Axis:   -40  Text Interpretation: Sinus rhythm with occasional Premature ventricular complexes Left axis deviation Left bundle branch block When compared with ECG of 01-[name]-2024 21:16, Premature ventricular complexes are now Present Left bundle branch block is now Present Confirmed by Eligha Bridegroom [phone]) on 11/05/2023 8:29:44 AM    Risk Assessment/Calculations:             Physical Exam:   VS:  BP 122/82   Pulse 96   Ht 5' 4.5" (1.638 m)   Wt 253 lb (114.8 kg)   SpO2 95%   BMI 42.76 kg/m    Wt Readings from Last 3 Encounters:  11/05/23 253 lb (114.8 kg)  10/10/23 249 lb 6 oz (113.1 kg)  07/31/23 252 lb 9.6 oz (114.6 kg)    GEN: Obese, well developed in no acute distress NECK: + JVD; No carotid bruits CARDIAC: RRR, no murmurs, rubs, gallops RESPIRATORY:  Clear to auscultation without rales, wheezing or rhonchi  ABDOMEN: Soft, non-tender, mild distention EXTREMITIES:  No edema; No deformity     ASSESSMENT AND PLAN: .    Preoperative cardiac evaluation: She has elected to postpone endoscopy/colonoscopy until feeling better. We will need to assess her activity index closer to time to determine if further testing is needed.   Chronic HFpEF/DOE: Reports abdominal swelling and occasional leg and arm swelling. Has JVD on exam but does not appear to be in acute distress. No increased work of breathing. Feeling better since restarting Entresto. Has occasional chest pain, no orthopnea or PND. Has been taking furosemide occasionally but has not consistently monitored weight in response to diuretic therapy.  Limits fluids to 2 L daily.  Admits to dietary indiscretion of sodium. Cost of medications has been challenging. Pharm D was able to get her [employer] grant so that she can resume Jardiance and continue Entresto at no cost. Will have her start furosemide 20 mg daily along with K-Dur 10 mEq and monitor for  response over the next 5 days. Will get BMP today to assess renal function and will plan to reassess in one month, sooner if clinically indicated. Continue Entresto, spironolactone, and metoprolol in addition to resuming furosemide and Jardiance. We will consider referral to cardiac rehab or [facility] program.   Chest pain: Symptoms occur in the setting of fluid overload and taking diuretic. She walks her dog at a slow pace 10-15 minutes 2 or 3 times per day with no worsening of symptoms. Reassurance provided regarding prior coronary CTA with no evidence of CAD. I suspect symptoms are secondary to volume overload.  We will diurese her as noted above and I will see her back for soon follow-up.   Hypertension: BP is well controlled. We will recheck renal function today.   Left BBB: Known. Cardiac MR 01/30/2023 revealed normal LVEF 56%, normal RV size and function, no scarring or evidence of myocardial infiltration or inflammation. No presyncope or syncope.  Medication management: Pharm D got her [employer] grant to cover cost of [place]  She will restart [place]  today. She was grateful for our assistance.   Dyslipidemia: Lipid panel 07/12/2022 with total cholesterol 110, HDL 42.6, LDL 34, and triglycerides 846.  She is on low-dose atorvastatin.  We will recheck fasting lipids when she returns in 1 month.  Obesity: Lengthy discussion about limiting sodium in diet and avoiding saturated fat, processed foods, simple carbohydrates, and sugar along with aiming for at least 150 minutes of moderate intensity exercise each week.  Physical activity has been somewhat limited by shortness of breath.  She is also somewhat deconditioned. Will see if cardiac rehab would be an option for her.        Dispo: 2-3 months with me  Signed, Eligha Bridegroom, NP-C

## 2023-11-04 ENCOUNTER — Other Ambulatory Visit (HOSPITAL_COMMUNITY): Payer: Self-pay

## 2023-11-05 ENCOUNTER — Telehealth: Payer: Self-pay

## 2023-11-05 ENCOUNTER — Other Ambulatory Visit: Payer: Self-pay | Admitting: *Deleted

## 2023-11-05 ENCOUNTER — Ambulatory Visit: Payer: HMO | Attending: Nurse Practitioner | Admitting: Nurse Practitioner

## 2023-11-05 ENCOUNTER — Encounter: Payer: Self-pay | Admitting: Nurse Practitioner

## 2023-11-05 VITALS — BP 122/82 | HR 96 | Ht 64.5 in | Wt 253.0 lb

## 2023-11-05 DIAGNOSIS — Z6841 Body Mass Index (BMI) 40.0 and over, adult: Secondary | ICD-10-CM | POA: Diagnosis not present

## 2023-11-05 DIAGNOSIS — I10 Essential (primary) hypertension: Secondary | ICD-10-CM

## 2023-11-05 DIAGNOSIS — E785 Hyperlipidemia, unspecified: Secondary | ICD-10-CM | POA: Diagnosis not present

## 2023-11-05 DIAGNOSIS — I447 Left bundle-branch block, unspecified: Secondary | ICD-10-CM | POA: Diagnosis not present

## 2023-11-05 DIAGNOSIS — I5032 Chronic diastolic (congestive) heart failure: Secondary | ICD-10-CM

## 2023-11-05 DIAGNOSIS — Z79899 Other long term (current) drug therapy: Secondary | ICD-10-CM

## 2023-11-05 DIAGNOSIS — R0609 Other forms of dyspnea: Secondary | ICD-10-CM | POA: Diagnosis not present

## 2023-11-05 DIAGNOSIS — E66813 Obesity, class 3: Secondary | ICD-10-CM

## 2023-11-05 MED ORDER — EMPAGLIFLOZIN 10 MG PO TABS: 10.0000 mg | ORAL_TABLET | Freq: Every day | ORAL | 3 refills | Status: DC

## 2023-11-05 NOTE — Telephone Encounter (Signed)
Per [name] in clinic after discussion with Eligha Bridegroom, NP about patient:  Can we refer her for pulmonary rehab with the diagnosis of diastolic heart failure. 2 x week non telemetry.   Order placed at this time.

## 2023-11-05 NOTE — Patient Instructions (Addendum)
Medication Instructions:   RESTART Jardiance one (1) tablet by mouth ( 10 mg) daily.   *If you need a refill on your cardiac medications before your next appointment, please call your pharmacy*   Lab Work:  TODAY!!!! BMET  Your physician recommends that you return for a FASTING lipid profile/cmet in one month. You can come in on the day of your appointment anytime between 7:30 am -4:30 pm fasting from midnight the night before. Closed from 12:30-1:30 for lunch.    If you have labs (blood work) drawn today and your tests are completely normal, you will receive your results only by: MyChart Message (if you have MyChart) OR A paper copy in the mail If you have any lab test that is abnormal or we need to change your treatment, we will call you to review the results.   Testing/Procedures:  None ordered.   Follow-Up: At [facility], you and your health needs are our priority.  As part of our continuing mission to provide you with exceptional heart care, we have created designated Provider Care Teams.  These Care Teams include your primary Cardiologist (physician) and Advanced Practice Providers (APPs -  Physician Assistants and Nurse Practitioners) who all work together to provide you with the care you need, when you need it.  We recommend signing up for the patient portal called "MyChart".  Sign up information is provided on this After Visit Summary.  MyChart is used to connect with patients for Virtual Visits (Telemedicine).  Patients are able to view lab/test results, encounter notes, upcoming appointments, etc.  Non-urgent messages can be sent to your provider as well.   To learn more about what you can do with MyChart, go to [email or web address].    Your next appointment:   3 month(s)  Provider:   Eligha Bridegroom, NP

## 2023-11-06 ENCOUNTER — Telehealth (HOSPITAL_COMMUNITY): Payer: Self-pay

## 2023-11-06 LAB — COMPREHENSIVE METABOLIC PANEL
ALT: 11 [IU]/L (ref 0–32)
AST: 14 [IU]/L (ref 0–40)
Albumin: 4.2 g/dL (ref 3.8–4.8)
Alkaline Phosphatase: 220 [IU]/L — ABNORMAL HIGH (ref 44–121)
BUN/Creatinine Ratio: 13 (ref 12–28)
BUN: 13 mg/dL (ref 8–27)
Bilirubin Total: 0.2 mg/dL (ref 0.0–1.2)
CO2: 22 mmol/L (ref 20–29)
Calcium: 9.3 mg/dL (ref 8.7–10.3)
Chloride: 106 mmol/L (ref 96–106)
Creatinine, Ser: 0.99 mg/dL (ref 0.57–1.00)
Globulin, Total: 2.6 g/dL (ref 1.5–4.5)
Glucose: 91 mg/dL (ref 70–99)
Potassium: 4.8 mmol/L (ref 3.5–5.2)
Sodium: 140 mmol/L (ref 134–144)
Total Protein: 6.8 g/dL (ref 6.0–8.5)
eGFR: 61 mL/min/{1.73_m2} (ref >59)

## 2023-11-06 LAB — LIPID PANEL
Chol/HDL Ratio: 3.3 ratio (ref 0.0–4.4)
Cholesterol, Total: 126 mg/dL (ref 100–199)
HDL: 38 mg/dL — ABNORMAL LOW (ref >39)
LDL Chol Calc (NIH): 56 mg/dL (ref 0–99)
Triglycerides: 197 mg/dL — ABNORMAL HIGH (ref 0–149)
VLDL Cholesterol Cal: 32 mg/dL (ref 5–40)

## 2023-11-06 NOTE — Telephone Encounter (Signed)
Attempted to call patient in regards to Pulmonary Rehab - LM on VM °

## 2023-11-06 NOTE — Telephone Encounter (Signed)
Office referral recv'ed, printed and given to RN for review. 

## 2023-11-28 ENCOUNTER — Other Ambulatory Visit: Payer: Self-pay

## 2023-11-28 ENCOUNTER — Other Ambulatory Visit: Payer: Self-pay | Admitting: Primary Care

## 2023-11-28 DIAGNOSIS — G479 Sleep disorder, unspecified: Secondary | ICD-10-CM

## 2023-11-28 DIAGNOSIS — Z961 Presence of intraocular lens: Secondary | ICD-10-CM | POA: Diagnosis not present

## 2023-11-28 DIAGNOSIS — H43813 Vitreous degeneration, bilateral: Secondary | ICD-10-CM | POA: Diagnosis not present

## 2023-11-28 DIAGNOSIS — H2511 Age-related nuclear cataract, right eye: Secondary | ICD-10-CM | POA: Diagnosis not present

## 2023-11-28 MED FILL — Mirtazapine Tab 30 MG: ORAL | 90 days supply | Qty: 90 | Fill #0 | Status: AC

## 2023-11-28 NOTE — Telephone Encounter (Signed)
Patient is due for CPE/follow up in January 2025, this will be required prior to any further refills.  Please schedule, thank you!

## 2023-11-29 ENCOUNTER — Other Ambulatory Visit: Payer: Self-pay

## 2023-11-29 NOTE — Telephone Encounter (Signed)
Lvm for patient tcb and schedule 

## 2023-12-01 ENCOUNTER — Other Ambulatory Visit: Payer: Self-pay

## 2023-12-03 ENCOUNTER — Other Ambulatory Visit: Payer: Self-pay | Admitting: Primary Care

## 2023-12-03 DIAGNOSIS — F419 Anxiety disorder, unspecified: Secondary | ICD-10-CM

## 2023-12-03 MED ORDER — BUPROPION HCL ER (SR) 100 MG PO TB12
100.0000 mg | ORAL_TABLET | Freq: Two times a day (BID) | ORAL | 0 refills | Status: DC
  Filled 2023-12-03 – 2024-01-28 (×3): qty 180, 90d supply, fill #0

## 2023-12-03 NOTE — Telephone Encounter (Signed)
Patient is due for CPE/follow up in January.Please schedule, thank you!

## 2023-12-04 ENCOUNTER — Encounter: Payer: HMO | Admitting: Gastroenterology

## 2023-12-04 ENCOUNTER — Other Ambulatory Visit (HOSPITAL_COMMUNITY): Payer: Self-pay

## 2023-12-04 ENCOUNTER — Other Ambulatory Visit: Payer: Self-pay

## 2023-12-04 NOTE — Telephone Encounter (Signed)
Patient scheduled for January  

## 2023-12-20 ENCOUNTER — Telehealth: Payer: Self-pay | Admitting: Cardiovascular Disease

## 2023-12-20 ENCOUNTER — Other Ambulatory Visit: Payer: Self-pay | Admitting: Primary Care

## 2023-12-20 DIAGNOSIS — G43009 Migraine without aura, not intractable, without status migrainosus: Secondary | ICD-10-CM

## 2023-12-20 NOTE — Telephone Encounter (Signed)
  Per MyChart scheduling message:  Pt c/o Shortness Of Breath: STAT if SOB developed within the last 24 hours or pt is noticeably SOB on the phone  1. Are you currently SOB (can you hear that pt is SOB on the phone)?   2. How long have you been experiencing SOB?   3. Are you SOB when sitting or when up moving around?   4. Are you currently experiencing any other symptoms?    1-yes. Every day 2-several months 3-generally ok sitting. Any activity causes shortness of breath and increased heart rate 4-fluid  in abdomen  every day, and hands   Currently taking  torsemide   and potassium daily

## 2023-12-20 NOTE — Telephone Encounter (Signed)
  Per MyChart scheduling message:   Pt c/o Shortness Of Breath: STAT if SOB developed within the last 24 hours or pt is noticeably SOB on the phone   1. Are you currently SOB (can you hear that pt is SOB on the phone)?    2. How long have you been experiencing SOB?    3. Are you SOB when sitting or when up moving around?    4. Are you currently experiencing any other symptoms?     1-yes. Every day 2-several months 3-generally ok sitting. Any activity causes shortness of breath and increased heart rate 4-fluid  in abdomen  every day, and hands   Currently taking  torsemide   and potassium daily  Spoke with patient and she states she has been compliant with taking the torsemide  since her last appointment with ARNP couple of months ago but even still says her shortness of breath is just progressively getting worse. States she has an appointment in February but really feels she needs to be seen sooner. Will make an appointment and call her back.

## 2023-12-20 NOTE — Telephone Encounter (Signed)
 Called back and spoke with patient. She was given appointment with [facility] for Thursday January 16th at 0800 with check in at 07:45 am. She asked if she should go ahead and cancel her appointment with CHRISTELLA Bane for February and I informed to wait until the first appointment to make a decision then. Patient voiced understanding.

## 2023-12-23 ENCOUNTER — Encounter (INDEPENDENT_AMBULATORY_CARE_PROVIDER_SITE_OTHER): Payer: Self-pay | Admitting: Physician Assistant

## 2023-12-24 ENCOUNTER — Encounter: Payer: HMO | Admitting: Primary Care

## 2023-12-27 ENCOUNTER — Ambulatory Visit: Payer: HMO

## 2023-12-30 ENCOUNTER — Other Ambulatory Visit: Payer: Self-pay | Admitting: Nurse Practitioner

## 2023-12-30 DIAGNOSIS — I10 Essential (primary) hypertension: Secondary | ICD-10-CM

## 2023-12-30 DIAGNOSIS — I5032 Chronic diastolic (congestive) heart failure: Secondary | ICD-10-CM

## 2023-12-31 ENCOUNTER — Other Ambulatory Visit: Payer: Self-pay | Admitting: Nurse Practitioner

## 2023-12-31 ENCOUNTER — Other Ambulatory Visit: Payer: Self-pay | Admitting: Primary Care

## 2023-12-31 ENCOUNTER — Other Ambulatory Visit: Payer: Self-pay

## 2023-12-31 ENCOUNTER — Other Ambulatory Visit (HOSPITAL_COMMUNITY): Payer: Self-pay

## 2023-12-31 ENCOUNTER — Other Ambulatory Visit: Payer: Self-pay | Admitting: Cardiovascular Disease

## 2023-12-31 DIAGNOSIS — E039 Hypothyroidism, unspecified: Secondary | ICD-10-CM

## 2023-12-31 DIAGNOSIS — G43009 Migraine without aura, not intractable, without status migrainosus: Secondary | ICD-10-CM

## 2023-12-31 DIAGNOSIS — E785 Hyperlipidemia, unspecified: Secondary | ICD-10-CM

## 2023-12-31 MED ORDER — TORSEMIDE 20 MG PO TABS
20.0000 mg | ORAL_TABLET | ORAL | 3 refills | Status: DC
  Filled 2023-12-31: qty 36, 84d supply, fill #0

## 2023-12-31 MED ORDER — LEVOTHYROXINE SODIUM 75 MCG PO TABS
75.0000 ug | ORAL_TABLET | Freq: Every morning | ORAL | 0 refills | Status: DC
  Filled 2023-12-31: qty 90, 90d supply, fill #0

## 2023-12-31 MED ORDER — ATORVASTATIN CALCIUM 10 MG PO TABS
10.0000 mg | ORAL_TABLET | Freq: Every day | ORAL | 0 refills | Status: DC
  Filled 2023-12-31: qty 90, 90d supply, fill #0

## 2023-12-31 MED ORDER — FUROSEMIDE 20 MG PO TABS
20.0000 mg | ORAL_TABLET | Freq: Every day | ORAL | 3 refills | Status: DC
  Filled 2023-12-31: qty 90, 90d supply, fill #0

## 2024-01-01 ENCOUNTER — Other Ambulatory Visit: Payer: Self-pay

## 2024-01-01 ENCOUNTER — Ambulatory Visit: Payer: HMO | Admitting: Primary Care

## 2024-01-01 VITALS — BP 122/80 | HR 125 | Temp 97.0°F | Ht 64.5 in | Wt 252.0 lb

## 2024-01-01 DIAGNOSIS — I10 Essential (primary) hypertension: Secondary | ICD-10-CM | POA: Diagnosis not present

## 2024-01-01 DIAGNOSIS — R000 Tachycardia, unspecified: Secondary | ICD-10-CM

## 2024-01-01 DIAGNOSIS — G43009 Migraine without aura, not intractable, without status migrainosus: Secondary | ICD-10-CM

## 2024-01-01 DIAGNOSIS — E890 Postprocedural hypothyroidism: Secondary | ICD-10-CM

## 2024-01-01 DIAGNOSIS — K219 Gastro-esophageal reflux disease without esophagitis: Secondary | ICD-10-CM

## 2024-01-01 DIAGNOSIS — E2839 Other primary ovarian failure: Secondary | ICD-10-CM

## 2024-01-01 DIAGNOSIS — I5032 Chronic diastolic (congestive) heart failure: Secondary | ICD-10-CM | POA: Diagnosis not present

## 2024-01-01 DIAGNOSIS — D509 Iron deficiency anemia, unspecified: Secondary | ICD-10-CM

## 2024-01-01 DIAGNOSIS — Z0001 Encounter for general adult medical examination with abnormal findings: Secondary | ICD-10-CM | POA: Diagnosis not present

## 2024-01-01 DIAGNOSIS — F419 Anxiety disorder, unspecified: Secondary | ICD-10-CM

## 2024-01-01 DIAGNOSIS — F32A Depression, unspecified: Secondary | ICD-10-CM

## 2024-01-01 DIAGNOSIS — R7303 Prediabetes: Secondary | ICD-10-CM | POA: Diagnosis not present

## 2024-01-01 DIAGNOSIS — M816 Localized osteoporosis [Lequesne]: Secondary | ICD-10-CM

## 2024-01-01 DIAGNOSIS — E785 Hyperlipidemia, unspecified: Secondary | ICD-10-CM | POA: Diagnosis not present

## 2024-01-01 DIAGNOSIS — R748 Abnormal levels of other serum enzymes: Secondary | ICD-10-CM

## 2024-01-01 DIAGNOSIS — Z1231 Encounter for screening mammogram for malignant neoplasm of breast: Secondary | ICD-10-CM

## 2024-01-01 DIAGNOSIS — G479 Sleep disorder, unspecified: Secondary | ICD-10-CM

## 2024-01-01 NOTE — Assessment & Plan Note (Signed)
 Repeat CBC pending.

## 2024-01-01 NOTE — Assessment & Plan Note (Signed)
Controlled.  Continue lansoprazole 15 mg BID.

## 2024-01-01 NOTE — Assessment & Plan Note (Signed)
 Deteriorated. Ruled out GI and bone causes. Never followed through with endocrinology referral last year.  Repeat CMP pending. Referral placed to endocrinology as she is ready for evaluation.   Stressed the importance of follow up with endocrinology.

## 2024-01-01 NOTE — Assessment & Plan Note (Addendum)
 Deteriorated.  Following with cardiology, reviewed office notes from November 2024. Will obtain lab work today.  Follow up with cardiology tomorrow.  Continue Jardiance  25 mg daily, Entresto  97-103 mg daily, spironolactone  25 mg daily, metoprolol  succinate 50 mg daily.  Consider increasing metoprolol  succinate to 75 mg due to tachycardia .  Will defer to cardiology tomorrow.  Start metoprolol  tartrate 25 mg today.

## 2024-01-01 NOTE — Assessment & Plan Note (Signed)
 Improved. Continue mirtazapine  30 mg at bedtime.

## 2024-01-01 NOTE — Assessment & Plan Note (Signed)
 Sinus tachycardia in the office today with rate of 121.  She will see cardiology tomorrow.  Continue metoprolol  succinate 50 mg daily for now, may need to increase the dose but will defer to cardiology. Start metoprolol  tartrate 25 mg today.  Continue daily as needed.  Follow-up with cardiology as scheduled tomorrow.  ED precautions provided.

## 2024-01-01 NOTE — Assessment & Plan Note (Signed)
 Repeat lipid panel pending.  Continue atorvastatin 10 mg daily.

## 2024-01-01 NOTE — Assessment & Plan Note (Signed)
 Repeat bone density scan due.

## 2024-01-01 NOTE — Assessment & Plan Note (Signed)
 Resolved.  Continue to monitor.

## 2024-01-01 NOTE — Assessment & Plan Note (Addendum)
 Immunizations UTD.  Defer influenza vaccine due to tachycardia up today. Mammogram and bone density due, orders placed. Colonoscopy overdue, she will schedule with GI.  Discussed the importance of a healthy diet and regular exercise in order for weight loss, and to reduce the risk of further co-morbidity.  Exam stable. Labs pending.  Follow up in 1 year for repeat physical.

## 2024-01-01 NOTE — Assessment & Plan Note (Signed)
 Controlled.  Continue Entresto  97-103 mg daily, metoprolol  succinate 50 mg daily, spironolactone  25 mg daily. CMP pending.

## 2024-01-01 NOTE — Assessment & Plan Note (Signed)
 Repeat A1c pending

## 2024-01-01 NOTE — Patient Instructions (Addendum)
 Take your metoprolol  to tartrate 25 mg pill soon as possible. Take another pill in 4 hours if your heart rate does not come down.  Follow-up with cardiology tomorrow as scheduled.  Stop by the lab prior to leaving today. I will notify you of your results once received.   [facility] your [facility].   You will either be contacted via phone regarding your referral to endocrinology and [facility] cardiology, or you may receive a letter on your MyChart portal from our referral team with instructions for scheduling an appointment. Please let us  know if you have not been contacted by anyone within two weeks.  It was a pleasure to see you today!

## 2024-01-01 NOTE — Assessment & Plan Note (Addendum)
 Controlled.  Continue Cymbalta  60 mg daily and Wellbutrin  SR 100 mg BId, mirtizapine 30 mg HS.

## 2024-01-01 NOTE — Progress Notes (Signed)
 Subjective:    Patient ID: Denise [name], female    DOB: 1951-05-13, 73 y.o.   MRN: [id number]  HPI  Denise [name] is a very pleasant 73 y.o. female who presents today for complete physical and follow up of chronic conditions.  She would like a referral to [facility] cardiology for management of her CHF. Currently follows with [place], doesn't feel as though she's being managed well. She continues to experience exertional shortness of breath, chronic abdominal fluid retention. She's also noticed increased tachycardia which occurs with any movement. She's been monitoring her HR which is running in the 130s with activity, takes about 15 minutes to slow down to the high 90s. She is compliant to her metoprolol  succinate 50 mg daily. She doesn't take her metoprolol  tartrate 25 mg dose.   Immunizations: -Influenza: Due, held in the setting of tachycardia.  -Shingles: Never completed  -Pneumonia: Completed Prevnar 13 in 2018, Pneumovax 23 in 2022  Diet: Fair diet.  Exercise: No regular exercise.  Eye exam: Completes annually  Dental exam: Completes semi-annually    Mammogram: Completed in December 2023 Bone Density Scan: Completed in January 2023  Colonoscopy: Completed in 2017, 2020.  Has not completed.  BP Readings from Last 3 Encounters:  01/01/24 122/80  11/05/23 122/82  10/10/23 120/70   Wt Readings from Last 3 Encounters:  01/01/24 252 lb (114.3 kg)  11/05/23 253 lb (114.8 kg)  10/10/23 249 lb 6 oz (113.1 kg)       Review of Systems  Constitutional:  Negative for unexpected weight change.  HENT:  Negative for rhinorrhea.   Respiratory:  Positive for shortness of breath. Negative for cough.   Cardiovascular:  Positive for chest pain.  Gastrointestinal:  Negative for constipation and diarrhea.  Genitourinary:  Negative for difficulty urinating.  Musculoskeletal:  Negative for arthralgias and myalgias.  Skin:  Negative for rash.  Allergic/Immunologic:  Negative for environmental allergies.  Neurological:  Negative for dizziness and headaches.  Psychiatric/Behavioral:  The patient is not nervous/anxious.          Past Medical History:  Diagnosis Date   Acute non-recurrent sinusitis 06/12/2023   Allergy    seasonal   Arthritis    Benzodiazepine intoxication (HCC)    Breast pain, left 04/18/2020   Bursitis of hip    Left   Chest pain 02/25/2017   CHF (congestive heart failure) (HCC)    Colitis 01/26/2019   Constipation    90%of the time c/o constipation, occ has diarrhea - uses stool softener    Diverticulosis of colon without hemorrhage 01/21/2020   Endometriosis    Facial flushing 05/12/2014   Fibroid    GERD (gastroesophageal reflux disease)    Hematochezia    Hiatal hernia    HSV-1 (herpes simplex virus 1) infection    Hyperlipidemia    Hyperplastic colon polyp    Hypertension    Hypothyroidism    Ischemic colitis (HCC) 01/2019   LBBB (left bundle branch block)    Low back pain    Migraine    Osteopenia 2008   -1.2 FEMORAL NECK   Overdose of benzodiazepine 08/11/2021   Prediabetes    Stroke [facility])    TIA   TIA (transient ischemic attack) 08/10/2013    Social History   Socioeconomic History   Marital status: Divorced    Spouse name: Not on file   Number of children: 2   Years of education: Not on file   Highest education level:  12th grade  Occupational History    Employer: RETIRED  Tobacco Use   Smoking status: Never    Passive exposure: Past   Smokeless tobacco: Never  Vaping Use   Vaping status: Never Used  Substance and Sexual Activity   Alcohol use: Yes    Alcohol/week: 0.0 standard drinks of alcohol    Comment: 1 every 3 months   Drug use: No   Sexual activity: Not Currently    Comment: intercourse age 24, more than 5 sexual partners,des neg  Other Topics Concern   Not on file  Social History Narrative   Single.   2 daughters, 4 grandchildren.   Once worked for [employer].    Enjoys Catering manager.    Social Drivers of Corporate investment banker Strain: Low Risk  (01/01/2024)   Overall Financial Resource Strain (CARDIA)    Difficulty of Paying Living Expenses: Not very hard  Food Insecurity: No Food Insecurity (01/01/2024)   Hunger Vital Sign    Worried About Running Out of Food in the Last Year: Never true    Ran Out of Food in the Last Year: Never true  Transportation Needs: No Transportation Needs (01/01/2024)   PRAPARE - Administrator, Civil Service (Medical): No    Lack of Transportation (Non-Medical): No  Physical Activity: Unknown (01/01/2024)   Exercise Vital Sign    Days of Exercise per Week: 0 days    Minutes of Exercise per Session: Not on file  Stress: No Stress Concern Present (01/01/2024)   [employer] of Occupational Health - Occupational Stress Questionnaire    Feeling of Stress : Only a little  Social Connections: Socially Isolated (01/01/2024)   Social Connection and Isolation Panel [NHANES]    Frequency of Communication with Friends and Family: Once a week    Frequency of Social Gatherings with Friends and Family: Once a week    Attends Religious Services: 1 to 4 times per year    Active Member of [employer] or Organizations: No    Attends Engineer, structural: Not on file    Marital Status: Divorced  Catering manager Violence: Not on file    Past Surgical History:  Procedure Laterality Date   ABDOMINAL HYSTERECTOMY  1989   TAH, BSO   CHOLECYSTECTOMY  2003   COLONOSCOPY     KNEE SURGERY  2004   right   NASAL SEPTUM SURGERY  2012   PITUITARY SURGERY  2001   POLYPECTOMY     SHOULDER SURGERY  2007   right   THYROID  LOBECTOMY  2002    Family History  Problem Relation Age of Onset   Cancer Mother        brain tumor   Heart disease Father    Parkinson's disease Sister    Breast cancer Maternal Aunt    Colon cancer Neg Hx    Colon polyps Neg Hx    Rectal cancer Neg Hx    Stomach  cancer Neg Hx    Thyroid  disease Neg Hx    Esophageal cancer Neg Hx    Pancreatic cancer Neg Hx     Allergies  Allergen Reactions   Tramadol Other (See Comments)    Reaction: NIGHTMARES Other reaction(s): Not available    Current Outpatient Medications on File Prior to Visit  Medication Sig Dispense Refill   alendronate  (FOSAMAX ) 70 MG tablet Take 1 tablet (70 mg total) by mouth every 7 (seven) days. Take with a  full glass of water on an empty stomach. Avoid laying flat for 2 hours. 12 tablet 3   aspirin  81 MG tablet Take 81 mg by mouth daily.     atorvastatin  (LIPITOR) 10 MG tablet Take 1 tablet (10 mg total) by mouth daily. for cholesterol. 90 tablet 0   buPROPion  ER (WELLBUTRIN  SR) 100 MG 12 hr tablet Take 1 tablet (100 mg total) by mouth 2 (two) times daily for depression 180 tablet 0   Cholecalciferol (D3) 10 MCG (400 UNIT) CHEW Chew by mouth.     DULoxetine  (CYMBALTA ) 60 MG capsule TAKE 1 CAPSULE BY MOUTH EVERY DAY TO PREVENT HEADACHE 90 capsule 0   empagliflozin  (JARDIANCE ) 10 MG TABS tablet Take 1 tablet (10 mg total) by mouth daily before breakfast. 90 tablet 3   KLOR-CON  M10 10 MEQ tablet Take 10 mEq by mouth as needed. With lasix .     lansoprazole (EQ LANSOPRAZOLE) 15 MG capsule Take 15 mg by mouth 2 (two) times daily.     levothyroxine  (SYNTHROID ) 75 MCG tablet Take 1 tablet (75 mcg total) by mouth in the morning on an empty stomach with water only. No food/other medications for 1/2 hour 90 tablet 0   magnesium  30 MG tablet Take 30 mg by mouth daily.     metoprolol  succinate (TOPROL -XL) 50 MG 24 hr tablet Take 1 tablet (50 mg total) by mouth daily. Take with or immediately following a meal. 90 tablet 3   metoprolol  tartrate (LOPRESSOR ) 25 MG tablet Take one (1) tablet by mouth ( 25 mg) Heart Rate greater than 100 or palpitations. (Patient taking differently: Take 25 mg by mouth as needed. Take one (1) tablet by mouth ( 25 mg) Heart Rate greater than 100 or palpitations.) 30  tablet 6   mirtazapine  (REMERON ) 30 MG tablet Take 1 tablet (30 mg total) by mouth at bedtime for sleep. 90 tablet 0   Multiple Minerals-Vitamins (CALCIUM -MAGNESIUM -ZINC-D3 PO) Take 1 capsule by mouth daily.     nitroGLYCERIN  (NITROSTAT ) 0.4 MG SL tablet Dissolve 1 tablet under the tongue every 5 minutes as needed for chest pain. Max of 3 doses, then 911. 25 tablet 6   sacubitril -valsartan  (ENTRESTO ) 97-103 MG Take 1 tablet by mouth 2 (two) times daily. 180 tablet 3   spironolactone  (ALDACTONE ) 25 MG tablet Take 1 tablet (25 mg total) by mouth daily. 90 tablet 3   torsemide  (DEMADEX ) 20 MG tablet Take 1 tablet (20 mg total) by mouth every Monday, Wednesday, and Friday. 36 tablet 3   furosemide  (LASIX ) 20 MG tablet Take 1 tablet (20 mg total) by mouth daily. (Patient not taking: Reported on 01/01/2024) 90 tablet 3   No current facility-administered medications on file prior to visit.    BP 122/80   Pulse (!) 125   Temp (!) 97 F (36.1 C) (Temporal)   Ht 5' 4.5" (1.638 m)   Wt 252 lb (114.3 kg)   SpO2 98%   BMI 42.59 kg/m  Objective:   Physical Exam HENT:     Right Ear: Tympanic membrane and ear canal normal.     Left Ear: Tympanic membrane and ear canal normal.  Eyes:     Pupils: Pupils are equal, round, and reactive to light.  Cardiovascular:     Rate and Rhythm: Regular rhythm. Tachycardia present.  Pulmonary:     Effort: Pulmonary effort is normal.     Breath sounds: Normal breath sounds.  Abdominal:     General: Bowel sounds are normal.  Palpations: Abdomen is soft.     Tenderness: There is no abdominal tenderness.  Musculoskeletal:        General: Normal range of motion.     Cervical back: Neck supple.  Skin:    General: Skin is warm and dry.  Neurological:     Mental Status: She is alert and oriented to person, place, and time.     Cranial Nerves: No cranial nerve deficit.     Deep Tendon Reflexes:     Reflex Scores:      Patellar reflexes are 2+ on the right  side and 2+ on the left side. Psychiatric:        Mood and Affect: Mood normal.           Assessment & Plan:  Encounter for annual general medical examination with abnormal findings in adult Assessment & Plan: Immunizations UTD.  Defer influenza vaccine due to tachycardia up today. Mammogram and bone density due, orders placed. Colonoscopy overdue, she will schedule with GI.  Discussed the importance of a healthy diet and regular exercise in order for weight loss, and to reduce the risk of further co-morbidity.  Exam stable. Labs pending.  Follow up in 1 year for repeat physical.    Screening mammogram for breast cancer -     3D Screening Mammogram, Left and Right; Future  Estrogen deficiency -     DG Bone Density; Future  Chronic diastolic CHF (congestive heart failure) (HCC) Assessment & Plan: Deteriorated.  Following with cardiology, reviewed office notes from November 2024. Will obtain lab work today.  Follow up with cardiology tomorrow.  Continue Jardiance  25 mg daily, Entresto  97-103 mg daily, spironolactone  25 mg daily, metoprolol  succinate 50 mg daily.  Consider increasing metoprolol  succinate to 75 mg due to tachycardia .  Will defer to cardiology tomorrow.  Start metoprolol  tartrate 25 mg today.  Orders: -     Ambulatory referral to Cardiology  Essential hypertension Assessment & Plan: Controlled.  Continue Entresto  97-103 mg daily, metoprolol  succinate 50 mg daily, spironolactone  25 mg daily. CMP pending.  Orders: -     Comprehensive metabolic panel -     CBC  Gastroesophageal reflux disease, unspecified whether esophagitis present Assessment & Plan: Controlled.  Continue lansoprazole 15 mg BID.   Migraine without aura and without status migrainosus, not intractable Assessment & Plan: Resolved.  Continue to monitor.    Postoperative hypothyroidism Assessment & Plan: She is taking levothyroxine  75 mcg correctly.  Continue  levothyroxine  75 mcg daily. Repeat TSH pending.  Orders: -     TSH  Localized osteoporosis without current pathological fracture Assessment & Plan: Repeat bone density scan due.   Elevated alkaline phosphatase level Assessment & Plan: Deteriorated. Ruled out GI and bone causes. Never followed through with endocrinology referral last year.  Repeat CMP pending. Referral placed to endocrinology as she is ready for evaluation.   Stressed the importance of follow up with endocrinology.   Orders: -     Ambulatory referral to Endocrinology  Anxiety and depression Assessment & Plan: Controlled.  Continue Cymbalta  60 mg daily and Wellbutrin  SR 100 mg BId, mirtizapine 30 mg HS.   Hyperlipidemia, unspecified hyperlipidemia type Assessment & Plan: Repeat lipid panel pending.  Continue atorvastatin  10 mg daily.  Orders: -     Lipid panel  Iron deficiency anemia, unspecified iron deficiency anemia type Assessment & Plan: Repeat CBC pending.   Tachycardia Assessment & Plan: Sinus tachycardia in the office today with rate of 121.  She will see cardiology tomorrow.  Continue metoprolol  succinate 50 mg daily for now, may need to increase the dose but will defer to cardiology. Start metoprolol  tartrate 25 mg today.  Continue daily as needed.  Follow-up with cardiology as scheduled tomorrow.  ED precautions provided.   Orders: -     EKG 12-Lead  Sleep disturbance Assessment & Plan: Improved. Continue mirtazapine  30 mg at bedtime.   Prediabetes Assessment & Plan: Repeat A1c pending.  Orders: -     Hemoglobin A1c        Gabriel John, NP

## 2024-01-01 NOTE — Assessment & Plan Note (Signed)
 She is taking levothyroxine  75 mcg correctly.  Continue levothyroxine  75 mcg daily. Repeat TSH pending.

## 2024-01-02 ENCOUNTER — Telehealth: Payer: Self-pay | Admitting: Primary Care

## 2024-01-02 ENCOUNTER — Ambulatory Visit: Payer: HMO | Attending: Cardiology | Admitting: Cardiology

## 2024-01-02 VITALS — BP 96/60 | HR 73 | Ht 64.5 in | Wt 254.0 lb

## 2024-01-02 DIAGNOSIS — I5032 Chronic diastolic (congestive) heart failure: Secondary | ICD-10-CM

## 2024-01-02 DIAGNOSIS — I10 Essential (primary) hypertension: Secondary | ICD-10-CM

## 2024-01-02 DIAGNOSIS — R0602 Shortness of breath: Secondary | ICD-10-CM | POA: Diagnosis not present

## 2024-01-02 LAB — TSH: TSH: 7.11 u[IU]/mL — ABNORMAL HIGH (ref 0.35–5.50)

## 2024-01-02 LAB — COMPREHENSIVE METABOLIC PANEL
ALT: 15 U/L (ref 0–35)
AST: 15 U/L (ref 0–37)
Albumin: 4.4 g/dL (ref 3.5–5.2)
Alkaline Phosphatase: 193 U/L — ABNORMAL HIGH (ref 39–117)
BUN: 36 mg/dL — ABNORMAL HIGH (ref 6–23)
CO2: 26 meq/L (ref 19–32)
Calcium: 9.9 mg/dL (ref 8.4–10.5)
Chloride: 99 meq/L (ref 96–112)
Creatinine, Ser: 1.57 mg/dL — ABNORMAL HIGH (ref 0.40–1.20)
GFR: 32.77 mL/min — ABNORMAL LOW (ref >60.00)
Glucose, Bld: 121 mg/dL — ABNORMAL HIGH (ref 70–99)
Potassium: 5.1 meq/L (ref 3.5–5.1)
Sodium: 137 meq/L (ref 135–145)
Total Bilirubin: 0.3 mg/dL (ref 0.2–1.2)
Total Protein: 7.3 g/dL (ref 6.0–8.3)

## 2024-01-02 LAB — LIPID PANEL
Cholesterol: 119 mg/dL (ref 0–200)
HDL: 39.1 mg/dL (ref >39.00)
LDL Cholesterol: 47 mg/dL (ref 0–99)
NonHDL: 79.88
Total CHOL/HDL Ratio: 3
Triglycerides: 166 mg/dL — ABNORMAL HIGH (ref 0.0–149.0)
VLDL: 33.2 mg/dL (ref 0.0–40.0)

## 2024-01-02 LAB — CBC
HCT: 42.8 % (ref 36.0–46.0)
Hemoglobin: 14 g/dL (ref 12.0–15.0)
MCHC: 32.8 g/dL (ref 30.0–36.0)
MCV: 84.7 fL (ref 78.0–100.0)
Platelets: 359 10*3/uL (ref 150.0–400.0)
RBC: 5.05 Mil/uL (ref 3.87–5.11)
RDW: 15 % (ref 11.5–15.5)
WBC: 9.9 10*3/uL (ref 4.0–10.5)

## 2024-01-02 LAB — PRO B NATRIURETIC PEPTIDE: NT-Pro BNP: 457 pg/mL — ABNORMAL HIGH (ref 0–301)

## 2024-01-02 LAB — HEMOGLOBIN A1C: Hgb A1c MFr Bld: 6.9 % — ABNORMAL HIGH (ref 4.6–6.5)

## 2024-01-02 NOTE — Patient Instructions (Addendum)
Medication Instructions:   Your physician recommends that you continue on your current medications as directed. Please refer to the Current Medication list given to you today.    *If you need a refill on your cardiac medications before your next appointment, please call your pharmacy*   Lab Work:   PLEASE GO DOWN STAIRS FIRST FLOOR  [address] :  PRO BNP TODAY     If you have labs (blood work) drawn today and your tests are completely normal, you will receive your results only by: MyChart Message (if you have MyChart) OR A paper copy in the mail If you have any lab test that is abnormal or we need to change your treatment, we will call you to review the results.   Testing/Procedures: Your physician has requested that you have an echocardiogram. Echocardiography is a painless test that uses sound waves to create images of your heart. It provides your doctor with information about the size and shape of your heart and how well your heart's chambers and valves are working. This procedure takes approximately one hour. There are no restrictions for this procedure. Please do NOT wear cologne, perfume, aftershave, or lotions (deodorant is allowed). Please arrive 15 minutes prior to your appointment time.  Please note: We ask at that you not bring children with you during ultrasound (echo/ vascular) testing. Due to room size and safety concerns, children are not allowed in the ultrasound rooms during exams. Our front office staff cannot provide observation of children in our lobby area while testing is being conducted. An adult accompanying a patient to their appointment will only be allowed in the ultrasound room at the discretion of the ultrasound technician under special circumstances. We apologize for any inconvenience.       Follow-Up: At [facility], you and your health needs are our priority.  As part of our continuing mission to provide you with exceptional heart care, we have  created designated Provider Care Teams.  These Care Teams include your primary Cardiologist (physician) and Advanced Practice Providers (APPs -  Physician Assistants and Nurse Practitioners) who all work together to provide you with the care you need, when you need it.  We recommend signing up for the patient portal called "MyChart".  Sign up information is provided on this After Visit Summary.  MyChart is used to connect with patients for Virtual Visits (Telemedicine).  Patients are able to view lab/test results, encounter notes, upcoming appointments, etc.  Non-urgent messages can be sent to your provider as well.   To learn more about what you can do with MyChart, go to [email or web address].     Your next appointment:   AS SCHEDULED    Other Instructions

## 2024-01-02 NOTE — Telephone Encounter (Signed)
Copied from CRM [id number]. Topic: Clinical - Lab/Test Results >> Jan 02, 2024 12:37 PM Irine Seal wrote: Reason for CRM: Pt was seen by cardiologist today, pt  stated that she did not see her ECG from her appt yesterday, she is requesting a copy of that to be added to her mychart informed her that it most likely hasn't had time to be input yet, she stated I still send the message to the clinic- pt requesting callback regarding ECG results. Callback # [phone] - thank you

## 2024-01-02 NOTE — Progress Notes (Signed)
Cardiology Office Note:   Date:  01/02/2024  ID:  Denise [name], DOB [birth year], MRN [id number] PCP: Doreene Nest, NP  [facility] Providers Cardiologist:  Kristeen Miss, MD    History of Present Illness:   Discussed the use of AI scribe software for clinical note transcription with the patient, who gave verbal consent to proceed.  History of Present Illness   The patient is a 73 year old individual with a complex medical history including left bundle branch block, chronic heart failure with preserved ejection fraction, obesity, iron deficiency anemia, hypertension, gastroesophageal reflux disease, and transient ischemic attack. The patient also has a family history of coronary artery disease. Despite being on guideline-directed medical therapy, including spironolactone, Jardiance, Entresto, and metoprolol, the patient has been experiencing persistent symptoms.  The patient reports a persistent struggle with shortness of breath since the summer, which has recently worsened. This dyspnea is associated with a sensation of fluid accumulation in the abdomen, described as feeling like she has "swallowed a water balloon." The patient also reports difficulty sleeping due to this sensation, often needing to sleep in a sitting position or with multiple pillows.  The patient has been monitoring her heart rate at home and reports episodes of tachycardia, with rates reaching up to 130 beats per minute. This was also noted at her PCP yesterday. These episodes are often associated with any physical activity, including walking or showering, and are accompanied by a faint chest discomfort. The patient also reports a significant decrease in physical activity due to these symptoms, including an inability to walk her dog, which she was previously able to do.  The patient has been taking torsemide daily for fluid management, and recently increased the dose to twice daily for a few days. This  resulted in a headache, diarrhea, and nausea, leading to a return to the once-daily dose. The patient reports no noticeable swelling in the legs, but does note puffiness in the hands and a hard feeling in the upper abdomen as noted above.  The patient has made dietary changes, focusing on fruits and reducing salt intake. She also reports a history of migraines, but these have not been present recently. The patient is currently on a sleep medication due to previous difficulties with sleep, and reports some improvement with this intervention.  In summary, this is a patient with a complex cardiac history, who is experiencing persistent and worsening dyspnea, episodes of tachycardia, and sensations of fluid retention in the abdomen. These symptoms are impacting the patient's quality of life, sleep, and ability to perform physical activities. Despite medication adjustments and dietary changes, these symptoms persist.      Studies Reviewed:    EKG:   EKG Interpretation Date/Time:  Thursday January 02 2024 08:09:11 EST Ventricular Rate:  73 PR Interval:  168 QRS Duration:  130 QT Interval:  422 QTC Calculation: 464 R Axis:   -57  Text Interpretation: Normal sinus rhythm Left axis deviation Left bundle branch block When compared with ECG of 05-Nov-2023 08:22, Premature ventricular complexes are no longer Present Confirmed by Perlie Gold [phone]) on 01/02/2024 8:13:06 AM    01/30/23 CMR  FINDINGS: 1. Normal left ventricular size, thickness and systolic function (LVEF = 56%). There are no regional wall motion abnormalities.   There is no late gadolinium enhancement in the left ventricular myocardium.   Normal native T1 value of 928 Ms.   Normal ECV value of 27%.   LVEDV: 140 ml   LVESV: 61 ml  SV: 78 ml   CO: 5.3 L/min   Myocardial mass: 90g   2. Normal right ventricular size, thickness and systolic function (RVEF = 53%). There are no regional wall motion abnormalities.   3.   Normal left and right atrial size.   4. Normal size of the aortic root, ascending aorta and pulmonary artery.   5.  No significant valvular abnormalities.   6.  Normal pericardium.  No pericardial effusion.   IMPRESSION: 1.  Normal LV size and function.  LVEF 56%.   2.  No myocardial LGE or scar.   3. Normal native T1 and ECV values, no evidence for myocardial infiltration or inflammation.   4.  Normal RV size and function.   5.  No evidence for myocarditis.  01/15/23 Zio  Patient had a min HR of 40 bpm, max HR of 200 bpm, and avg HR of 80 bpm. Predominant underlying rhythm was Sinus Rhythm. Intermittent Bundle Branch Block was present. QRS morphology changes were present throughout recording. 1 run of Supraventricular  Tachycardia occurred lasting 8 beats with a max rate of 200 bpm (avg 175 bpm). Isolated SVEs were rare (<1.0%), SVE Couplets were rare (<1.0%), and no SVE Triplets were present. Isolated VEs were rare (<1.0%), and no VE Couplets or VE Triplets were  present.  Risk Assessment/Calculations:              Physical Exam:   VS:  BP 96/60   Pulse 73   Ht 5' 4.5" (1.638 m)   Wt 254 lb (115.2 kg)   SpO2 97%   BMI 42.93 kg/m    Wt Readings from Last 3 Encounters:  01/02/24 254 lb (115.2 kg)  01/01/24 252 lb (114.3 kg)  11/05/23 253 lb (114.8 kg)     Physical Exam Vitals reviewed.  Constitutional:      Appearance: Normal appearance.  HENT:     Head: Normocephalic.     Nose: Nose normal.  Eyes:     Pupils: Pupils are equal, round, and reactive to light.  Cardiovascular:     Rate and Rhythm: Normal rate and regular rhythm.     Pulses: Normal pulses.     Heart sounds: Normal heart sounds. No murmur heard.    No friction rub. No gallop.  Pulmonary:     Effort: Pulmonary effort is normal.     Breath sounds: Normal breath sounds.  Abdominal:     General: Abdomen is flat.  Musculoskeletal:     Right lower leg: No edema.     Left lower leg: No edema.   Skin:    General: Skin is warm and dry.     Capillary Refill: Capillary refill takes less than 2 seconds.  Neurological:     General: No focal deficit present.     Mental Status: She is alert and oriented to person, place, and time.  Psychiatric:        Mood and Affect: Mood normal.        Behavior: Behavior normal.        Thought Content: Thought content normal.        Judgment: Judgment normal.      ASSESSMENT AND PLAN:     Assessment and Plan    Shortness of Breath   Persistent dyspnea since at least the summer, exacerbated by exertion/relieved by rest and associated with sensation of abdominal fullness. Symptoms have not improved significantly despite guideline-directed medical therapy for HFpEF. Differential diagnosis includes fluid overload, cardiac dysfunction,  and potential non-cardiac causes such as pulmonary issues. Discussed potential pulmonology referral if further cardiac evaluation is unremarkable. I also wonder if some of her upper abdominal discomfort may be related to her hiatal hernia. No evidence of hypervolemia on physical exam today.  - Order ProBNP lab   - Order echocardiogram   - Continue daily Torsemide 20mg  with potassium supplementation - Continue HFpEF GDMT as below   - Review primary care labs when available   - Consider pulmonology referral if cardiac evaluation is unremarkable    Heart Failure with Preserved Ejection Fraction (HFpEF)   Diagnosed with HFpEF, currently on guideline-directed medical therapy including Entresto, Jardiance, spironolactone, and metoprolol. LVEF preserved on 2024 CMR. Symptoms of orthopnea, exertional dyspnea, and abdominal fullness suggest possible fluid overload though physical exam today without hypervolemia. Recent coronary CTA and cardiac MR were unremarkable.    - Continue Entresto, Jardiance, spironolactone, and metoprolol   - Monitor for signs of fluid overload   - Adjust torsemide dosage based on lab results and  clinical symptoms   - Consider pulmonology referral if cardiac workup benign  Hypertension   Hypertension managed with metoprolol, spironolactone, Entresto. Blood pressure is well-controlled. Discussed importance of monitoring blood pressure, especially with additional doses of metoprolol tartrate.   - Continue current antihypertensive regimen    Iron Deficiency Anemia   Iron deficiency anemia managed with iron supplementation. Persistent fatigue noted. Discussed need to monitor hemoglobin and iron levels to ensure adequate treatment.   - Continue iron supplementation   - Monitor hemoglobin and iron levels  - Follow up with PCP   Obesity   Obesity contributing to overall health status and possibly exacerbating HFpEF symptoms. She has made dietary changes to improve health. Encouraged continued dietary modifications and physical activity as tolerated.   - Encourage continued dietary modifications   - Promote physical activity as tolerated    LBBB and palpitations Stable ECG tracing today. Reports frequent elevated HR in the setting of exertion. Rates seem to quickly stabilize at rest. Suspect multi-factorial etiology including HFpEF, obesity/deconditioning.  - Continue Toprol XL 50mg  daily with PRN Lopressor for persistent HR elevation.   Hyperlipidemia LDL 56,  HDL 38, Triglycerides 197 as of 11/05/23. Continue Atorvastatin.   Gastroesophageal Reflux Disease (GERD)   GERD managed with lifestyle modifications and medications. No recent exacerbations reported.   - Continue current GERD management    General Health Maintenance   Significant dietary changes made to reduce salt intake. Last lipid panel in November 2024 was within normal limits. Discussed importance of continued healthy eating habits and regular monitoring of lipid levels.   - Review lipid panel results when available   - Encourage continued healthy eating habits    Follow-up   - Follow up with Eligha Bridegroom in one  month   - Review lab results from primary care   - Notify patient of ProBNP and echocardiogram results.            Cardiac Rehabilitation Eligibility Assessment          Signed, Perlie Gold, PA-C

## 2024-01-02 NOTE — Telephone Encounter (Signed)
Called and spoke with patient, printed out a hard copy and placed up front for patient to pickup.

## 2024-01-03 ENCOUNTER — Telehealth: Payer: Self-pay

## 2024-01-03 DIAGNOSIS — I5032 Chronic diastolic (congestive) heart failure: Secondary | ICD-10-CM

## 2024-01-03 NOTE — Telephone Encounter (Signed)
Left message for patient to call back. I have also sent her a MyChart message in regards to medication instructions and need to repeat labs next week. Labs have been ordered.

## 2024-01-03 NOTE — Telephone Encounter (Signed)
-----   Message from Perlie Gold sent at 01/03/2024  2:39 PM EST ----- NT-Pro BNP is elevated at 457. This can indicate worsening heart failure/volume status, however, there are other factors as well that can contribute to increased levels including renal dysfunction. Your recent metabolic panel shows a notable increase in creatinine levels which reflects some decreased kidney function. Given the recent diarrhea, there may be a component of dehydration. Even though NT pro BNP is elevated, I would recommend holding Torsemide and Spironolactone for the next 2 days. Your potassium is also borderline elevated and as we discussed, Spironolactone and Entresto both raise levels. I would recommend stopping potassium supplementation indefinitely.  We will plan to repeat a basic metabolic panel on Monday or Tuesday. I will also see if we can expedite your echocardiogram to get this completed prior to currently scheduled 2/5 date. I've copied Dr. Elease Hashimoto as well as your PCP so that everyone is kept in the loop.

## 2024-01-06 ENCOUNTER — Other Ambulatory Visit (HOSPITAL_COMMUNITY): Payer: Self-pay

## 2024-01-06 ENCOUNTER — Other Ambulatory Visit: Payer: Self-pay | Admitting: Primary Care

## 2024-01-06 DIAGNOSIS — E1165 Type 2 diabetes mellitus with hyperglycemia: Secondary | ICD-10-CM

## 2024-01-06 MED ORDER — TIRZEPATIDE 2.5 MG/0.5ML ~~LOC~~ SOAJ
2.5000 mg | SUBCUTANEOUS | 0 refills | Status: DC
  Filled 2024-01-06: qty 2, 28d supply, fill #0

## 2024-01-06 NOTE — Telephone Encounter (Signed)
Pt reviewed MyChart message RE: labs sent by Frutoso Schatz, RN.  Pt aware of instructions and that she does not need a rx in hand for the lab draw.

## 2024-01-07 ENCOUNTER — Other Ambulatory Visit (HOSPITAL_COMMUNITY): Payer: Self-pay

## 2024-01-07 ENCOUNTER — Telehealth: Payer: Self-pay

## 2024-01-07 DIAGNOSIS — I10 Essential (primary) hypertension: Secondary | ICD-10-CM | POA: Diagnosis not present

## 2024-01-07 DIAGNOSIS — I5032 Chronic diastolic (congestive) heart failure: Secondary | ICD-10-CM | POA: Diagnosis not present

## 2024-01-07 NOTE — Telephone Encounter (Signed)
*  Primary  Pharmacy Patient Advocate Encounter  Received notification from [facility] [employer] that Prior Authorization for Mounjaro 2.5MG /0.5ML auto-injectors  has been APPROVED from 01/07/2024 to 01/06/2025. Ran test claim, Copay is $47.00. This test claim was processed through [facility]- copay amounts may vary at other pharmacies due to pharmacy/plan contracts, or as the patient moves through the different stages of their insurance plan.   PA #/Case ID/Reference #: [id number]

## 2024-01-08 ENCOUNTER — Telehealth: Payer: Self-pay

## 2024-01-08 ENCOUNTER — Other Ambulatory Visit (HOSPITAL_COMMUNITY): Payer: Self-pay

## 2024-01-08 ENCOUNTER — Other Ambulatory Visit: Payer: Self-pay | Admitting: Primary Care

## 2024-01-08 DIAGNOSIS — E890 Postprocedural hypothyroidism: Secondary | ICD-10-CM

## 2024-01-08 LAB — BASIC METABOLIC PANEL
BUN/Creatinine Ratio: 18 (ref 12–28)
BUN: 17 mg/dL (ref 8–27)
CO2: 21 mmol/L (ref 20–29)
Calcium: 9.5 mg/dL (ref 8.7–10.3)
Chloride: 105 mmol/L (ref 96–106)
Creatinine, Ser: 0.94 mg/dL (ref 0.57–1.00)
Glucose: 110 mg/dL — ABNORMAL HIGH (ref 70–99)
Potassium: 4.6 mmol/L (ref 3.5–5.2)
Sodium: 139 mmol/L (ref 134–144)
eGFR: 64 mL/min/{1.73_m2} (ref >59)

## 2024-01-08 NOTE — Telephone Encounter (Signed)
-----   Message from Perlie Gold sent at 01/08/2024 12:11 PM EST ----- Regarding: Lab follow up Please resume Torsemide and spironolactone once daily. I would continue to hold potassium supplement. How are you feeling, how is breathing, swelling, weight?  Perlie Gold, PA-C ----- Message ----- From: Levi Aland, NP Sent: 01/08/2024   6:44 AM EST To: Lovina Reach, PA-C  Creatinine has improved and potassium and other electrolytes are stable. You were advised to hold spironolactone and torsemide for 2 days. As well as discontinued potassium supplement. I would recommend resuming/continuing medications that you were on at the time of your last office visit.  I am also forwarding to Perlie Gold, PA who last saw you in the office for any additional recommendations.

## 2024-01-08 NOTE — Telephone Encounter (Signed)
Called and spoke with patient she stated over the past month when she eats lunch or dinner 30 min later her stomach gets upset and she is having very loose stools. Patient states her bowel movements aside from after eating are formed and normal for her. She is having bowel movements daily.

## 2024-01-08 NOTE — Telephone Encounter (Signed)
Denise [name], her abdominal bloating could be from constipation.  Please ask patient about bowel movements.  How often does she have bowel movements?  Would recommend stool softener if she has difficulty.

## 2024-01-08 NOTE — Telephone Encounter (Signed)
Spoke with patient and discussed recommendations based on recent lab results.  Patient verbalized understanding to resume torsemide and spironolactone once daily and continue to hold potassium supplement.  Patient states she still has SOB with activity and her HR increases to >100 (115-20) with any activity such as walking to mailbox or showering.  She takes Lopressor 25mg  PRN and states this helps, but only seems to last for 1-2 days before she needs to take it again.  She also reports feeling as though she has swelling/fluid in her abdomen. Weight yesterday was 255.6 lbs. Her last dose of torsemide and spironolactone were last week, she will resume today.  She also reports her PCP has started her on Mounjaro. Already on medication list.

## 2024-01-09 NOTE — Telephone Encounter (Signed)
She does not have a gall bladder which can cause those symptoms.  Recommend office visit with me to discuss GI symptoms.  Also, has she scheduled an appointment with endocrinology for her elevated alk phos level?

## 2024-01-09 NOTE — Telephone Encounter (Signed)
Noted  

## 2024-01-09 NOTE — Telephone Encounter (Signed)
Called and scheduled patient appt with Jae Dire to discuss GI symptoms. Patient has not yet scheduled appointment with endocrinology, she stated she will call this coming week.

## 2024-01-11 ENCOUNTER — Other Ambulatory Visit (HOSPITAL_COMMUNITY): Payer: Self-pay

## 2024-01-14 ENCOUNTER — Ambulatory Visit: Payer: HMO | Admitting: Primary Care

## 2024-01-16 ENCOUNTER — Ambulatory Visit: Payer: HMO | Admitting: Primary Care

## 2024-01-20 ENCOUNTER — Other Ambulatory Visit: Payer: HMO

## 2024-01-22 ENCOUNTER — Ambulatory Visit (HOSPITAL_COMMUNITY): Payer: HMO

## 2024-01-23 ENCOUNTER — Encounter: Payer: Self-pay | Admitting: *Deleted

## 2024-01-27 ENCOUNTER — Other Ambulatory Visit (INDEPENDENT_AMBULATORY_CARE_PROVIDER_SITE_OTHER): Payer: HMO

## 2024-01-27 DIAGNOSIS — E890 Postprocedural hypothyroidism: Secondary | ICD-10-CM

## 2024-01-27 LAB — TSH: TSH: 1.19 u[IU]/mL (ref 0.35–5.50)

## 2024-01-28 ENCOUNTER — Ambulatory Visit: Payer: HMO

## 2024-01-28 ENCOUNTER — Other Ambulatory Visit (HOSPITAL_COMMUNITY): Payer: Self-pay

## 2024-01-28 DIAGNOSIS — M542 Cervicalgia: Secondary | ICD-10-CM | POA: Diagnosis not present

## 2024-01-28 DIAGNOSIS — M545 Low back pain, unspecified: Secondary | ICD-10-CM | POA: Diagnosis not present

## 2024-01-28 DIAGNOSIS — M7918 Myalgia, other site: Secondary | ICD-10-CM | POA: Diagnosis not present

## 2024-01-29 ENCOUNTER — Telehealth: Payer: Self-pay

## 2024-01-29 NOTE — Telephone Encounter (Signed)
Copied from CRM [id number]. Topic: Referral - Status >> Jan 29, 2024  8:23 AM Fredrich Romans wrote: Reason for CRM: [facility] called stating that referral that was sent over for patient,her insurance is OON with their facility and she would need it redirected to another location in network

## 2024-01-30 NOTE — Progress Notes (Unsigned)
 Cardiology Office Note:  .   Date:  02/05/2024  ID:  Denise [name], DOB 05-31-1951, MRN [id number] PCP: Doreene Nest, NP  [facility] Providers Cardiologist:  Kristeen Miss, MD    Patient Profile: .      PMH Left bundle branch block Coronary calcium score of 0 Chronic HFpEF Obesity Iron deficiency anemia Hypertension Family history CAD Father - MI mid 66s Grandmother - stroke GERD TIA  Referred to cardiology and seen by Dr. Elease Hashimoto for evaluation of shortness of breath on 07/16/2022. Had recent TTE 07/09/22 that revealed normal LVEF 55-60%, no rwma, indeterminate diastolic parameters, and only trivial.  She reported leg edema and running out of breath while walking and dressing, as well as two-pillow orthopnea. Symptoms felt to be secondary to HFpEF and she was started on GDMT over the course of several office visits including spironolactone, Jardiance, Entresto, and metoprolol.  She did not feel much improvement on Lasix and was subsequently switched to torsemide. She had difficulty affording Entresto and during times of financial burden, she was on valsartan.  She underwent coronary CTA 08/28/2022 which revealed no evidence of CAD and coronary calcium score of 0.  7-day ZIO monitor revealed predominant rhythm sinus rhythm, 1 run of SVT lasting 8 beats.  Symptoms persisted despite good medical therapy and she underwent cardiac MR on 01/30/2023 which revealed normal LV size and function with LVEF 56%, no myocardial LGE or scar, no evidence for myocardial inflammation or infiltration, no evidence for myocarditis, normal RV size and function.  She reported significant weight gain since she had retired for a total of 100 lbs over the course of a couple of years.  She was found to be anemic and was started on iron.  She continued to feel fatigued but orthopnea improved.  Last cardiology clinic visit was 02/11/2023 with Dr. Elease Hashimoto.  She reported difficulty exercising due to back  issues.  She did not appear volume overloaded and no medication changes were made.  She was advised to return in 1 year for follow-up.  She contacted our office 09/24/2023 to report shortness of breath that had worsened over the previous 2 months.  She was also feeling abdominal distention and nausea she had stopped Jardiance and Entresto due to cost but was planning to get Entresto filled for 1 month. Our nursing team could not reach her to triage her symptoms.   Seen in clinic by me on 11/05/23. She was considering having colonoscopy but decided to postpone. She continued to note fluid retention in her abdomen. Occasional leg and arm swelling. Has difficulty with stairs due to breathlessness and hip pain. No orthopnea or PND. Gets out of breath with walking at times. Has had financial difficulty getting Entresto and [place] and has stopped them intermittently. We enrolled her in the [employer] grant which will cover the cost of [place]. Reports fluctuations in weight, which she monitors but not daily.  Admits she eats out frequently which she acknowledges may be high in sodium. She expresses a desire to improve her diet and is open to participating in an exercise program. No presyncope or syncope. We referred her to cardiac rehab and planned to check BNP, however there is no record that it was completed. Furosemide was resumed because she felt a better response when taking it over torsemide.   She returned to clinic 01/02/24 and was seen by Perlie Gold, PA for persistent DOE. She described feeling "like I swallowed a water balloon." Reported  HR up to 130s at times. She had resumed torsemide. BNP mildly elevated at 457, Scr 1.57, K+ 5.1. She was advised to hold torsemide and spironolactone for 2 days and d/c potassium supplement. Follow-up BMET on 01/07/24 revealed K+ 4.6 and Scr 0.94. She was advised to resume spironolactone and torsemide.        History of Present Illness: .   Denise [name] is a very pleasant 73 y.o. female who is here today for follow-up of chronic HFpEF She reports rapid heart rate during activities such as washing dishes and taking a shower, with elevations up to 140 bpm. She feels shortness of breath when HR is elevated. Generally takes about 10-15 minutes for the heart rate to return to normal. This has been limiting her physical activities. She also reports feeling weak and dizzy at times, which she suspects could be related to her medications. At times, she feels fluid overloaded, particularly with abdominal distention. She rarely has edema in her extremities. Reports that her weight fluctuates, with the highest recorded weight being 258 lbs at home. Feels that base weight is in the upper 240s. She has started [facility] for weight loss. Reports that since resuming Jardiance and Entresto on a regular basis in January, she has overall felt an improvement in her fluid status and general health. Previously, cost was an issue but we were able to get her the [employer] grant which is covering the cost of these medications. She is also working on weight loss through dietary changes. She has cut out sweets, chips, and sodas from her diet and has been consuming more fruits and vegetables. She denies chest pain, orthopnea, PND, presyncope, or syncope.   Discussed the use of AI scribe software for clinical note transcription with the patient, who gave verbal consent to proceed.   ROS: See HPI       Studies Reviewed: .        Risk Assessment/Calculations:             Physical Exam:   VS:  BP 112/69   Pulse 78   Ht 5' 4.5" (1.638 m)   Wt 249 lb (112.9 kg)   SpO2 97%   BMI 42.08 kg/m    Wt Readings from Last 3 Encounters:  02/04/24 249 lb (112.9 kg)  01/02/24 254 lb (115.2 kg)  01/01/24 252 lb (114.3 kg)    GEN: Obese, well developed in no acute distress NECK: No JVD; No carotid bruits CARDIAC: RRR, no murmurs, rubs, gallops RESPIRATORY:  Clear to  auscultation without rales, wheezing or rhonchi  ABDOMEN: Soft, non-tender, mild distention EXTREMITIES:  No edema; No deformity     ASSESSMENT AND PLAN: .     Chronic HFpEF/DOE: Reports abdominal swelling and fluctuations in weight. Reports overall, she feels volume status has improved on current GDMT which has been more consistent since we got her financial assistance with Sherryll Burger and Jardiance. She gets short of breath and tachycardic with minimal exertion at times. No chest pain, orthopnea or PND. Weight fluctuates somewhat, but overall appears to be stable.  She is eating better and is limiting sodium and avoiding processed foods. Limits fluids to 2 L daily. We completed a walk test around the office and she became short of breath walking about 500 feet. No evidence of CAD on coronary CTA 08/2022. I suspect DOE may be at least somewhat secondary to deconditioning. Will refer her to pulmonary rehab. Will get BMP today to assess renal function. She is  scheduled for echo next week. Continue GDMT with metoprolol, Entresto, empagliflozin, torsemide, and spironolactone.  Tachycardia: She becomes tachycardic and somewhat tachypneic with moderate exertion. In response, she has limited her activity. Walk test completed in the office. HR remained well controlled at 106 bpm after walking about 600 feet. Lengthy discussion about likely deconditioning in the setting of anxiety associated with symptoms. We will refer to pulmonary rehab to improve conditioning. Encouraged home exercise with walking, chair exercises. We discussed the indication for use of lopressor with significant tachycardia. Continue daily Toprol XL.   Hypertension: BP is well controlled, soft at times. We will recheck renal function today. Continue current anti-hypertensive therapy including metoprolol, Entresto, spironolactone.  Left BBB: Known. Cardiac MR 01/30/2023 revealed normal LVEF 56%, normal RV size and function, no scarring or  evidence of myocardial infiltration or inflammation. She is asymptomatic. Is awaiting repeat echo.   Dizziness: Occasional dizziness with activity. She asks about carotid artery disease. History of carotid ultrasound 10+ years ago in the setting of TIA. We will repeat carotid duplex for surveillance.   Dyslipidemia: Lipid panel completed 01/01/2024 with total cholesterol 119, HDL 39, LDL 47, and triglycerides 161.  Well-controlled.  Continue atorvastatin.  Obesity: Has recently started [facility] for weight loss and has made dietary improvements. Encouraged her to continue heart healthy diet avoiding high sodium, processed, and fatty foods. Aim to increase physical activity as tolerated.       Cardiac Rehabilitation Eligibility Assessment  The patient is ready to start cardiac rehabilitation from a cardiac standpoint.       Disposition: 3 months with me  Signed, Eligha Bridegroom, NP-C

## 2024-02-04 ENCOUNTER — Telehealth: Payer: Self-pay | Admitting: Family

## 2024-02-04 ENCOUNTER — Ambulatory Visit: Payer: HMO | Attending: Nurse Practitioner | Admitting: Nurse Practitioner

## 2024-02-04 ENCOUNTER — Encounter: Payer: Self-pay | Admitting: Nurse Practitioner

## 2024-02-04 VITALS — BP 112/69 | HR 78 | Ht 64.5 in | Wt 249.0 lb

## 2024-02-04 DIAGNOSIS — R000 Tachycardia, unspecified: Secondary | ICD-10-CM | POA: Diagnosis not present

## 2024-02-04 DIAGNOSIS — I5032 Chronic diastolic (congestive) heart failure: Secondary | ICD-10-CM

## 2024-02-04 DIAGNOSIS — I10 Essential (primary) hypertension: Secondary | ICD-10-CM | POA: Diagnosis not present

## 2024-02-04 DIAGNOSIS — E785 Hyperlipidemia, unspecified: Secondary | ICD-10-CM | POA: Diagnosis not present

## 2024-02-04 DIAGNOSIS — R0602 Shortness of breath: Secondary | ICD-10-CM

## 2024-02-04 DIAGNOSIS — Z6841 Body Mass Index (BMI) 40.0 and over, adult: Secondary | ICD-10-CM

## 2024-02-04 DIAGNOSIS — I447 Left bundle-branch block, unspecified: Secondary | ICD-10-CM | POA: Diagnosis not present

## 2024-02-04 DIAGNOSIS — E66813 Obesity, class 3: Secondary | ICD-10-CM | POA: Diagnosis not present

## 2024-02-04 DIAGNOSIS — R42 Dizziness and giddiness: Secondary | ICD-10-CM

## 2024-02-04 NOTE — Telephone Encounter (Signed)
 Lvm to schedule referral appt

## 2024-02-04 NOTE — Patient Instructions (Signed)
 Medication Instructions:   Your physician recommends that you continue on your current medications as directed. Please refer to the Current Medication list given to you today.   *If you need a refill on your cardiac medications before your next appointment, please call your pharmacy*   Lab Work:  TODAY!!!! BMET  If you have labs (blood work) drawn today and your tests are completely normal, you will receive your results only by: MyChart Message (if you have MyChart) OR A paper copy in the mail If you have any lab test that is abnormal or we need to change your treatment, we will call you to review the results.   Testing/Procedures:  Your physician has requested that you have a carotid duplex. This test is an ultrasound of the carotid arteries in your neck. It looks at blood flow through these arteries that supply the brain with blood. Allow one hour for this exam. There are no restrictions or special instructions.    Follow-Up: At [facility], you and your health needs are our priority.  As part of our continuing mission to provide you with exceptional heart care, we have created designated Provider Care Teams.  These Care Teams include your primary Cardiologist (physician) and Advanced Practice Providers (APPs -  Physician Assistants and Nurse Practitioners) who all work together to provide you with the care you need, when you need it.  We recommend signing up for the patient portal called "MyChart".  Sign up information is provided on this After Visit Summary.  MyChart is used to connect with patients for Virtual Visits (Telemedicine).  Patients are able to view lab/test results, encounter notes, upcoming appointments, etc.  Non-urgent messages can be sent to your provider as well.   To learn more about what you can do with MyChart, go to [email or web address].    Your next appointment:   3 month(s)  Provider:   Eligha Bridegroom, NP    Other Instructions  You have  been referred to [facility] The office will call you to set up appointment.

## 2024-02-04 NOTE — Telephone Encounter (Signed)
  Referral re-routed to [facility].  [facility] at [facility] Phone: [phone] [address], [address] [place], [place] [address]  Patient made aware via Mychart

## 2024-02-05 ENCOUNTER — Encounter: Payer: Self-pay | Admitting: Nurse Practitioner

## 2024-02-05 ENCOUNTER — Encounter (HOSPITAL_BASED_OUTPATIENT_CLINIC_OR_DEPARTMENT_OTHER): Payer: Self-pay

## 2024-02-05 ENCOUNTER — Ambulatory Visit: Payer: HMO

## 2024-02-05 LAB — BASIC METABOLIC PANEL
BUN/Creatinine Ratio: 16 (ref 12–28)
BUN: 17 mg/dL (ref 8–27)
CO2: 24 mmol/L (ref 20–29)
Calcium: 9.1 mg/dL (ref 8.7–10.3)
Chloride: 103 mmol/L (ref 96–106)
Creatinine, Ser: 1.08 mg/dL — ABNORMAL HIGH (ref 0.57–1.00)
Glucose: 82 mg/dL (ref 70–99)
Potassium: 5.2 mmol/L (ref 3.5–5.2)
Sodium: 139 mmol/L (ref 134–144)
eGFR: 55 mL/min/{1.73_m2} — ABNORMAL LOW (ref >59)

## 2024-02-10 ENCOUNTER — Ambulatory Visit (HOSPITAL_COMMUNITY): Payer: HMO | Attending: Cardiology

## 2024-02-10 DIAGNOSIS — R0602 Shortness of breath: Secondary | ICD-10-CM | POA: Diagnosis not present

## 2024-02-10 LAB — ECHOCARDIOGRAM COMPLETE
Area-P 1/2: 3.03 cm2
S' Lateral: 2.6 cm

## 2024-02-11 ENCOUNTER — Ambulatory Visit
Admission: RE | Admit: 2024-02-11 | Discharge: 2024-02-11 | Disposition: A | Discharge status: Home / Self Care | Payer: HMO | Source: Ambulatory Visit | Attending: Primary Care

## 2024-02-11 DIAGNOSIS — Z1231 Encounter for screening mammogram for malignant neoplasm of breast: Secondary | ICD-10-CM | POA: Diagnosis not present

## 2024-02-12 ENCOUNTER — Encounter (HOSPITAL_COMMUNITY): Payer: Self-pay

## 2024-02-12 DIAGNOSIS — M25361 Other instability, right knee: Secondary | ICD-10-CM | POA: Diagnosis not present

## 2024-02-12 DIAGNOSIS — M25561 Pain in right knee: Secondary | ICD-10-CM | POA: Diagnosis not present

## 2024-02-12 NOTE — Progress Notes (Signed)
 Called pt to see if she was interested in participating in the Pulmonary Rehab program. No answer. VM was left.

## 2024-02-12 NOTE — Progress Notes (Signed)
 Received referral from Dr. Melburn Popper for this pt to participate in Pulmonary Rehab with the diagnosis of Chronic diastolic heart failure. Clinical review of pt follow up appt on 02/04/24 Cardiology office note. Pt appropriate for scheduling for Pulmonary rehab. Will forward to support staff for scheduling and verification of insurance eligibility/benefits with pt consent.   Denise [name] Cardiac and Pulmonary Rehab

## 2024-02-14 ENCOUNTER — Ambulatory Visit (HOSPITAL_COMMUNITY)
Admission: RE | Admit: 2024-02-14 | Discharge: 2024-02-14 | Disposition: A | Discharge status: Home / Self Care | Payer: HMO | Source: Ambulatory Visit | Attending: Internal Medicine | Admitting: Internal Medicine

## 2024-02-14 DIAGNOSIS — R000 Tachycardia, unspecified: Secondary | ICD-10-CM | POA: Insufficient documentation

## 2024-02-14 DIAGNOSIS — R42 Dizziness and giddiness: Secondary | ICD-10-CM | POA: Diagnosis not present

## 2024-02-14 DIAGNOSIS — R0602 Shortness of breath: Secondary | ICD-10-CM | POA: Insufficient documentation

## 2024-02-14 DIAGNOSIS — I10 Essential (primary) hypertension: Secondary | ICD-10-CM | POA: Insufficient documentation

## 2024-02-14 DIAGNOSIS — I5032 Chronic diastolic (congestive) heart failure: Secondary | ICD-10-CM | POA: Diagnosis not present

## 2024-02-15 ENCOUNTER — Other Ambulatory Visit: Payer: Self-pay | Admitting: Primary Care

## 2024-02-15 DIAGNOSIS — E1165 Type 2 diabetes mellitus with hyperglycemia: Secondary | ICD-10-CM

## 2024-02-16 MED ORDER — TIRZEPATIDE 5 MG/0.5ML ~~LOC~~ SOAJ
5.0000 mg | SUBCUTANEOUS | 0 refills | Status: DC
  Filled 2024-02-16: qty 6, 84d supply, fill #0

## 2024-02-17 ENCOUNTER — Other Ambulatory Visit (HOSPITAL_COMMUNITY): Payer: Self-pay

## 2024-02-17 ENCOUNTER — Encounter (HOSPITAL_COMMUNITY): Payer: Self-pay

## 2024-02-17 ENCOUNTER — Encounter (HOSPITAL_BASED_OUTPATIENT_CLINIC_OR_DEPARTMENT_OTHER): Payer: Self-pay

## 2024-02-17 NOTE — Progress Notes (Signed)
 Attempted to call pt and schedule for Pulmonary rehab, but pt did not answer. VM was left.

## 2024-02-21 ENCOUNTER — Ambulatory Visit: Admitting: Family Medicine

## 2024-02-21 ENCOUNTER — Encounter: Payer: Self-pay | Admitting: Family Medicine

## 2024-02-21 VITALS — BP 104/70 | HR 92 | Temp 98.0°F | Ht 64.5 in | Wt 258.2 lb

## 2024-02-21 DIAGNOSIS — E1165 Type 2 diabetes mellitus with hyperglycemia: Secondary | ICD-10-CM

## 2024-02-21 DIAGNOSIS — R350 Frequency of micturition: Secondary | ICD-10-CM | POA: Insufficient documentation

## 2024-02-21 LAB — POC URINALSYSI DIPSTICK (AUTOMATED)
Bilirubin, UA: 1
Glucose, UA: POSITIVE — AB
Ketones, UA: 5
Nitrite, UA: POSITIVE
Protein, UA: POSITIVE — AB
Spec Grav, UA: >=1.03 — AB (ref 1.010–1.025)
Urobilinogen, UA: 0.2 U/dL
pH, UA: 5.5 (ref 5.0–8.0)

## 2024-02-21 LAB — GLUCOSE, POCT (MANUAL RESULT ENTRY): POC Glucose: 123 mg/dL — AB (ref 70–99)

## 2024-02-21 MED ORDER — SULFAMETHOXAZOLE-TRIMETHOPRIM 800-160 MG PO TABS: 1.0000 | ORAL_TABLET | Freq: Two times a day (BID) | ORAL | 0 refills | Status: DC

## 2024-02-21 NOTE — Progress Notes (Signed)
 Patient ID: Denise [name], female    DOB: [birth year], 73 y.o.   MRN: [id number]  This visit was conducted in person.  BP 104/70 (BP Location: Left Arm, Patient Position: Sitting, Cuff Size: Large)   Pulse 92   Temp 98 F (36.7 C) (Temporal)   Ht 5' 4.5" (1.638 m)   Wt 258 lb 4 oz (117.1 kg)   SpO2 98%   BMI 43.64 kg/m    CC:  Chief Complaint  Patient presents with   Urinary Frequency    With Abnormal Odor to Urine    Subjective:   HPI: Denise [name] is a 73 y.o. female presenting on 02/21/2024 for Urinary Frequency (With Abnormal Odor to Urine)  Urinary Frequency  This is a new problem. The current episode started 1 to 4 weeks ago (2 weeks). The problem occurs intermittently. The problem has been waxing and waning. The pain is at a severity of 0/10. The patient is experiencing no pain. There has been no fever. She is Not sexually active. There is No history of pyelonephritis. Associated symptoms include frequency and urgency. Pertinent negatives include no chills, discharge, flank pain, hematuria, hesitancy, nausea, possible pregnancy, sweats or vomiting. Associated symptoms comments:  Has noted odor, cloudiness. She has tried increased fluids for the symptoms. The treatment provided no relief. There is no history of catheterization, kidney stones, recurrent UTIs, a single kidney, urinary stasis or a urological procedure.   Lab Results  Component Value Date   HGBA1C 6.9 (H) 01/01/2024    Recent new start of Mounjaro  x 1 month and prednisone 2 weeks for  back.  No recent antibiotics   Not able to check blood sugar.   No recent UTIs   On jardiance.. no perineal symptoms, vaginal symptoms.       Relevant past medical, surgical, family and social history reviewed and updated as indicated. Interim medical history since our last visit reviewed. Allergies and medications reviewed and updated. Outpatient Medications Prior to Visit  Medication Sig Dispense Refill    alendronate (FOSAMAX) 70 MG tablet Take 1 tablet (70 mg total) by mouth every 7 (seven) days. Take with a full glass of water on an empty stomach. Avoid laying flat for 2 hours. 12 tablet 3   aspirin 81 MG tablet Take 81 mg by mouth daily.     atorvastatin (LIPITOR) 10 MG tablet Take 1 tablet (10 mg total) by mouth daily. for cholesterol. 90 tablet 0   buPROPion ER (WELLBUTRIN SR) 100 MG 12 hr tablet Take 1 tablet (100 mg total) by mouth 2 (two) times daily for depression 180 tablet 0   Cholecalciferol (D3) 10 MCG (400 UNIT) CHEW Chew by mouth.     DULoxetine (CYMBALTA) 60 MG capsule TAKE 1 CAPSULE BY MOUTH EVERY DAY TO PREVENT HEADACHE 90 capsule 0   empagliflozin (JARDIANCE) 10 MG TABS tablet Take 1 tablet (10 mg total) by mouth daily before breakfast. 90 tablet 3   KLOR-CON M10 10 MEQ tablet Take 10 mEq by mouth as needed. With lasix.     lansoprazole (EQ LANSOPRAZOLE) 15 MG capsule Take 15 mg by mouth 2 (two) times daily.     levothyroxine (SYNTHROID) 75 MCG tablet Take 1 tablet (75 mcg total) by mouth in the morning on an empty stomach with water only. No food/other medications for 1/2 hour 90 tablet 0   magnesium 30 MG tablet Take 30 mg by mouth daily.     methocarbamol (ROBAXIN) 500 MG  tablet Take 500 mg by mouth every 6 (six) hours as needed for muscle spasms.     metoprolol succinate (TOPROL-XL) 50 MG 24 hr tablet Take 1 tablet (50 mg total) by mouth daily. Take with or immediately following a meal. 90 tablet 3   metoprolol tartrate (LOPRESSOR) 25 MG tablet Take one (1) tablet by mouth ( 25 mg) Heart Rate greater than 100 or palpitations. 30 tablet 6   mirtazapine (REMERON) 30 MG tablet Take 1 tablet (30 mg total) by mouth at bedtime for sleep. 90 tablet 0   Multiple Minerals-Vitamins (CALCIUM-MAGNESIUM-ZINC-D3 PO) Take 1 capsule by mouth daily.     nitroGLYCERIN (NITROSTAT) 0.4 MG SL tablet Dissolve 1 tablet under the tongue every 5 minutes as needed for chest pain. Max of 3 doses, then  911. 25 tablet 6   predniSONE (DELTASONE) 5 MG tablet Take 5 mg by mouth daily with breakfast.     sacubitril-valsartan (ENTRESTO) 97-103 MG Take 1 tablet by mouth 2 (two) times daily. 180 tablet 3   spironolactone (ALDACTONE) 25 MG tablet Take 1 tablet (25 mg total) by mouth daily. 90 tablet 3   tirzepatide (MOUNJARO) 5 MG/0.5ML Pen Inject 5 mg into the skin once a week. For diabetes 6 mL 0   torsemide (DEMADEX) 20 MG tablet Take 20 mg by mouth daily.     No facility-administered medications prior to visit.     Per HPI unless specifically indicated in ROS section below Review of Systems  Constitutional:  Negative for chills.  Gastrointestinal:  Negative for nausea and vomiting.  Genitourinary:  Positive for frequency and urgency. Negative for flank pain, hematuria and hesitancy.   Objective:  BP 104/70 (BP Location: Left Arm, Patient Position: Sitting, Cuff Size: Large)   Pulse 92   Temp 98 F (36.7 C) (Temporal)   Ht 5' 4.5" (1.638 m)   Wt 258 lb 4 oz (117.1 kg)   SpO2 98%   BMI 43.64 kg/m   Wt Readings from Last 3 Encounters:  02/21/24 258 lb 4 oz (117.1 kg)  02/04/24 249 lb (112.9 kg)  01/02/24 254 lb (115.2 kg)      Physical Exam Constitutional:      General: She is not in acute distress.    Appearance: Normal appearance. She is well-developed. She is not ill-appearing or toxic-appearing.  HENT:     Head: Normocephalic.     Right Ear: Hearing, tympanic membrane, ear canal and external ear normal. Tympanic membrane is not erythematous, retracted or bulging.     Left Ear: Hearing, tympanic membrane, ear canal and external ear normal. Tympanic membrane is not erythematous, retracted or bulging.     Nose: No mucosal edema or rhinorrhea.     Right Sinus: No maxillary sinus tenderness or frontal sinus tenderness.     Left Sinus: No maxillary sinus tenderness or frontal sinus tenderness.     Mouth/Throat:     Pharynx: Uvula midline.  Eyes:     General: Lids are normal.  Lids are everted, no foreign bodies appreciated.     Conjunctiva/sclera: Conjunctivae normal.     Pupils: Pupils are equal, round, and reactive to light.  Neck:     Thyroid: No thyroid mass or thyromegaly.     Vascular: No carotid bruit.     Trachea: Trachea normal.  Cardiovascular:     Rate and Rhythm: Normal rate and regular rhythm.     Pulses: Normal pulses.     Heart sounds: Normal heart sounds, S1  normal and S2 normal. No murmur heard.    No friction rub. No gallop.  Pulmonary:     Effort: Pulmonary effort is normal. No tachypnea or respiratory distress.     Breath sounds: Normal breath sounds. No decreased breath sounds, wheezing, rhonchi or rales.  Abdominal:     General: Bowel sounds are normal.     Palpations: Abdomen is soft.     Tenderness: There is no abdominal tenderness.  Musculoskeletal:     Cervical back: Normal range of motion and neck supple.  Skin:    General: Skin is warm and dry.     Findings: No rash.  Neurological:     Mental Status: She is alert.  Psychiatric:        Mood and Affect: Mood is not anxious or depressed.        Speech: Speech normal.        Behavior: Behavior normal. Behavior is cooperative.        Thought Content: Thought content normal.        Judgment: Judgment normal.       Results for orders placed or performed in visit on 02/21/24  POCT Urinalysis Dipstick (Automated)   Collection Time: 02/21/24 10:28 AM  Result Value Ref Range   Color, UA Yellow    Clarity, UA Clear    Glucose, UA Positive (A) Negative   Bilirubin, UA 1 mg/dl (1+)    Ketones, UA 5 mg/dl (trace)    Spec Grav, UA >=1.030 (A) 1.010 - 1.025   Blood, UA Large (3+)    pH, UA 5.5 5.0 - 8.0   Protein, UA Positive (A) Negative   Urobilinogen, UA 0.2 0.2 or 1.0 E.U./dL   Nitrite, UA Positive    Leukocytes, UA Small (1+) (A) Negative  POCT glucose (manual entry)   Collection Time: 02/21/24 10:53 AM  Result Value Ref Range   POC Glucose 123 (A) 70 - 99 mg/dl     Assessment and Plan  Urinary frequency Assessment & Plan: Acute, some concern for urinary tract infection given initial urinalysis but this was a very small amount of urine and possibly is contaminated. She will try to recollect for  urine culture. She does have glucose in her urine and has recently been on prednisone but her fasting blood sugar in the office is 123.  Will treat empirically with Bactrim double strength twice daily x 3 days.  She will push fluids. She will continue working on low carbohydrate diet.  She we will call with urine culture results  Orders: -     POCT Urinalysis Dipstick (Automated) -     Urine Culture -     POCT glucose (manual entry)  Type 2 diabetes mellitus with hyperglycemia, without long-term current use of insulin (HCC)  Other orders -     Sulfamethoxazole-Trimethoprim; Take 1 tablet by mouth 2 (two) times daily.  Dispense: 6 tablet; Refill: 0    No follow-ups on file.   Kerby Nora, MD

## 2024-02-21 NOTE — Assessment & Plan Note (Signed)
 Acute, some concern for urinary tract infection given initial urinalysis but this was a very small amount of urine and possibly is contaminated. She will try to recollect for  urine culture. She does have glucose in her urine and has recently been on prednisone but her fasting blood sugar in the office is 123.  Will treat empirically with Bactrim double strength twice daily x 3 days.  She will push fluids. She will continue working on low carbohydrate diet.  She we will call with urine culture results

## 2024-02-23 LAB — URINE CULTURE
MICRO NUMBER:: 16173874
SPECIMEN QUALITY:: ADEQUATE

## 2024-02-24 ENCOUNTER — Other Ambulatory Visit: Payer: Self-pay | Admitting: Cardiovascular Disease

## 2024-02-24 ENCOUNTER — Encounter: Payer: Self-pay | Admitting: Family Medicine

## 2024-02-24 ENCOUNTER — Other Ambulatory Visit (HOSPITAL_COMMUNITY): Payer: Self-pay

## 2024-02-24 ENCOUNTER — Other Ambulatory Visit: Payer: Self-pay | Admitting: Primary Care

## 2024-02-24 ENCOUNTER — Other Ambulatory Visit: Payer: Self-pay | Admitting: Family Medicine

## 2024-02-24 DIAGNOSIS — F32A Depression, unspecified: Secondary | ICD-10-CM

## 2024-02-24 DIAGNOSIS — G479 Sleep disorder, unspecified: Secondary | ICD-10-CM

## 2024-02-24 MED ORDER — SULFAMETHOXAZOLE-TRIMETHOPRIM 800-160 MG PO TABS
1.0000 | ORAL_TABLET | Freq: Two times a day (BID) | ORAL | 0 refills | Status: DC
  Filled 2024-02-24: qty 6, 3d supply, fill #0

## 2024-02-25 ENCOUNTER — Other Ambulatory Visit: Payer: Self-pay

## 2024-02-26 ENCOUNTER — Other Ambulatory Visit (HOSPITAL_COMMUNITY): Payer: Self-pay

## 2024-03-03 ENCOUNTER — Other Ambulatory Visit: Payer: Self-pay | Admitting: Primary Care

## 2024-03-03 ENCOUNTER — Other Ambulatory Visit (HOSPITAL_COMMUNITY): Payer: Self-pay

## 2024-03-03 DIAGNOSIS — G479 Sleep disorder, unspecified: Secondary | ICD-10-CM

## 2024-03-03 DIAGNOSIS — F419 Anxiety disorder, unspecified: Secondary | ICD-10-CM

## 2024-03-04 MED ORDER — MIRTAZAPINE 30 MG PO TABS: 30.0000 mg | ORAL_TABLET | Freq: Every day | ORAL | 0 refills | Status: DC

## 2024-03-05 DIAGNOSIS — H25811 Combined forms of age-related cataract, right eye: Secondary | ICD-10-CM | POA: Diagnosis not present

## 2024-03-05 DIAGNOSIS — Z961 Presence of intraocular lens: Secondary | ICD-10-CM | POA: Diagnosis not present

## 2024-03-05 DIAGNOSIS — H43812 Vitreous degeneration, left eye: Secondary | ICD-10-CM | POA: Diagnosis not present

## 2024-03-13 ENCOUNTER — Ambulatory Visit: Payer: HMO | Admitting: Endocrinology

## 2024-03-20 ENCOUNTER — Other Ambulatory Visit: Payer: Self-pay | Admitting: Primary Care

## 2024-03-20 DIAGNOSIS — M5451 Vertebrogenic low back pain: Secondary | ICD-10-CM | POA: Diagnosis not present

## 2024-03-20 DIAGNOSIS — M542 Cervicalgia: Secondary | ICD-10-CM | POA: Diagnosis not present

## 2024-03-20 DIAGNOSIS — G43009 Migraine without aura, not intractable, without status migrainosus: Secondary | ICD-10-CM

## 2024-03-25 ENCOUNTER — Telehealth (HOSPITAL_COMMUNITY): Payer: Self-pay

## 2024-03-25 ENCOUNTER — Encounter (HOSPITAL_COMMUNITY): Payer: Self-pay

## 2024-03-25 DIAGNOSIS — H43812 Vitreous degeneration, left eye: Secondary | ICD-10-CM | POA: Diagnosis not present

## 2024-03-25 DIAGNOSIS — H43392 Other vitreous opacities, left eye: Secondary | ICD-10-CM | POA: Diagnosis not present

## 2024-03-25 DIAGNOSIS — H35373 Puckering of macula, bilateral: Secondary | ICD-10-CM | POA: Diagnosis not present

## 2024-03-25 DIAGNOSIS — H2511 Age-related nuclear cataract, right eye: Secondary | ICD-10-CM | POA: Diagnosis not present

## 2024-03-25 DIAGNOSIS — H5315 Visual distortions of shape and size: Secondary | ICD-10-CM | POA: Diagnosis not present

## 2024-03-25 NOTE — Telephone Encounter (Signed)
 Attempted to call patient in regards to Pulmonary Rehab - LM on VM   Mailed letter

## 2024-04-06 ENCOUNTER — Ambulatory Visit: Payer: Self-pay

## 2024-04-06 NOTE — Telephone Encounter (Signed)
 Labs will be done the day we evaluate patient.

## 2024-04-06 NOTE — Telephone Encounter (Deleted)
  I am dizzy upon standing and sometimes walking. I want to check to see if I'm anemic again.

## 2024-04-06 NOTE — Telephone Encounter (Signed)
 Called and spoke with patient, advised of Denise [name] message. Patient refused to go to ED states she does not feel that she needs to go and sit for hours in the emergency department. Offered UC as an option to be seen sooner, patient declined as well and stated all they would do is send her to ED. Offered to move patients appointment up with Denise [name] to 4/25 as patient declined all other avenues to be seen sooner. Patient now has appt scheduled with Denise [name] on 4/25 @ 12:20pm. Patient asked about having labs done ahead of time, advised Denise [name] is leaving the up to [place].

## 2024-04-06 NOTE — Telephone Encounter (Signed)
 Chief Complaint: dizziness Symptoms: dizziness, SOB on exertion, abd bloating Frequency: 1 wk Pertinent Negatives: Patient denies chest pain, fever, bleeding, diaphoresis, N/V  Disposition: [x] ED /[] Urgent Care (no appt availability in office) / [] Appointment(In office/virtual)/ []  [place] [facility]/ [] Home Care/ [x] Refused Recommended Disposition /[] [place] [facility]/ []  Follow-up with PCP Additional Notes: Staff at [facility] called into NT to have this pt triaged. Pt made an appt for 5/1 for dizziness. Pt reports moderate dizziness 2-3x a day for the last week. Pt states dizziness is brought on by standing up and walking. Pt states she will stand still for 20 seconds until the dizziness resolves. Pt states she has been dizzy before with anemia, thinks this may be the cause. Pt has a hx of CHF and reports SOB on exertion starting yesterday with abd bloating/swelling. Pt states she feels she is retaining fluid. Pt states she is taking a diuretic. Pt states her HR will go up to 120 when she is SOB with exertion. Pt denies N/V, bleeding, diaphoresis, fever, one-sided weakness. Pt states she is trying to drink more water because she felt she wasn't drinking enough last week and is on a diuretic. Pt denies having a fluid restriction.  Given pt is SOB on exertion with dizziness and abd bloating RN advised pt go to the ED. RN explained to pt she may need IV diuretics or fluid drained from her abdomen and we need to rule out a cardiac or life-threatening cause for dizziness. Pt declined at this time and states she would rather keep for 5/1 appt. RN advised pt if she develops worsening SOB or CP she needs to call 911, pt verbalized understanding.  Pt also asked if she can come in for a lab draw before her 5/1 appt to see what her labs are like. RN advised pt RN would relay question to the office.   Reason for Disposition  Patient sounds very sick or weak to the triager    SOB on exertion w/  abdominal swelling & hx of CHF, moderate dizziness  Answer Assessment - Initial Assessment Questions 1. DESCRIPTION: "Describe your dizziness."     Dizziness with standing up and walking, denies dizziness every time she gets up, pt states she gets dizzy 2-3x/day 2. LIGHTHEADED: "Do you feel lightheaded?" (e.g., somewhat faint, woozy, weak upon standing)     yes 3. VERTIGO: "Do you feel like either you or the room is spinning or tilting?" (i.e. vertigo)     Not now, endorses hx of vertigo and states this is different 4. SEVERITY: "How bad is it?"  "Do you feel like you are going to faint?" "Can you stand and walk?"   - MILD: Feels slightly dizzy, but walking normally.   - MODERATE: Feels unsteady when walking, but not falling; interferes with normal activities (e.g., school, work).   - SEVERE: Unable to walk without falling, or requires assistance to walk without falling; feels like passing out now.      Moderate - has to pause for 20 seconds when dizzy, has not fallen 5. ONSET:  "When did the dizziness begin?"     Week 6. AGGRAVATING FACTORS: "Does anything make it worse?" (e.g., standing, change in head position)     Standing, walking 7. HEART RATE: "Can you tell me your heart rate?" "How many beats in 15 seconds?"  (Note: not all patients can do this)       Denies heart palpitations at rest but states when she is SOB  on exertion her HR goes up to 120 8. CAUSE: "What do you think is causing the dizziness?"     Possible anemia 9. RECURRENT SYMPTOM: "Have you had dizziness before?" If Yes, ask: "When was the last time?" "What happened that time?"     Yes, when she was anemic in the past 10. OTHER SYMPTOMS: "Do you have any other symptoms?" (e.g., fever, chest pain, vomiting, diarrhea, bleeding)       Hx of CHF, states her belly is bloated and she has SOB. States she is taking fluid pills. Denies SOB at rest, endorses SOB on exertion with activity or walking from room to room. Denies rectal  bleeding. No fever. No CP. Denies vomiting or diarrhea. Pt states last week she noticed she was drinking less water, is trying to drink more now. Denies one-sided weakness or speech difficulty.  Protocols used: Dizziness - Lightheadedness-A-AH

## 2024-04-06 NOTE — Telephone Encounter (Signed)
 Noted. With elevated heart rate, DOE, and dizziness she needs sooner evaluation. I will defer lab question to PCP

## 2024-04-07 NOTE — Telephone Encounter (Signed)
 Patient notified

## 2024-04-08 ENCOUNTER — Ambulatory Visit: Payer: HMO | Admitting: Primary Care

## 2024-04-09 ENCOUNTER — Telehealth (HOSPITAL_COMMUNITY): Payer: Self-pay

## 2024-04-09 ENCOUNTER — Ambulatory Visit (INDEPENDENT_AMBULATORY_CARE_PROVIDER_SITE_OTHER): Admitting: Primary Care

## 2024-04-09 ENCOUNTER — Encounter: Payer: Self-pay | Admitting: Primary Care

## 2024-04-09 VITALS — BP 120/68 | HR 116 | Temp 97.9°F | Ht 64.5 in | Wt 254.5 lb

## 2024-04-09 DIAGNOSIS — E1165 Type 2 diabetes mellitus with hyperglycemia: Secondary | ICD-10-CM

## 2024-04-09 DIAGNOSIS — R42 Dizziness and giddiness: Secondary | ICD-10-CM | POA: Insufficient documentation

## 2024-04-09 DIAGNOSIS — R21 Rash and other nonspecific skin eruption: Secondary | ICD-10-CM | POA: Diagnosis not present

## 2024-04-09 LAB — IBC + FERRITIN
Ferritin: 24.7 ng/mL (ref 10.0–291.0)
Iron: 46 ug/dL (ref 42–145)
Saturation Ratios: 13.2 % — ABNORMAL LOW (ref 20.0–50.0)
TIBC: 347.2 ug/dL (ref 250.0–450.0)
Transferrin: 248 mg/dL (ref 212.0–360.0)

## 2024-04-09 LAB — BASIC METABOLIC PANEL WITH GFR
BUN: 17 mg/dL (ref 6–23)
CO2: 26 meq/L (ref 19–32)
Calcium: 8.4 mg/dL (ref 8.4–10.5)
Chloride: 104 meq/L (ref 96–112)
Creatinine, Ser: 1.03 mg/dL (ref 0.40–1.20)
GFR: 54.23 mL/min — ABNORMAL LOW (ref >60.00)
Glucose, Bld: 117 mg/dL — ABNORMAL HIGH (ref 70–99)
Potassium: 4.8 meq/L (ref 3.5–5.1)
Sodium: 138 meq/L (ref 135–145)

## 2024-04-09 LAB — CBC
HCT: 41 % (ref 36.0–46.0)
Hemoglobin: 13.5 g/dL (ref 12.0–15.0)
MCHC: 33 g/dL (ref 30.0–36.0)
MCV: 84.2 fl (ref 78.0–100.0)
Platelets: 339 10*3/uL (ref 150.0–400.0)
RBC: 4.86 Mil/uL (ref 3.87–5.11)
RDW: 15.6 % — ABNORMAL HIGH (ref 11.5–15.5)
WBC: 10.4 10*3/uL (ref 4.0–10.5)

## 2024-04-09 LAB — HEMOGLOBIN A1C: Hgb A1c MFr Bld: 6.4 % (ref 4.6–6.5)

## 2024-04-09 MED ORDER — NYSTATIN 100000 UNIT/GM EX CREA
1.0000 | TOPICAL_CREAM | Freq: Two times a day (BID) | CUTANEOUS | 0 refills | Status: DC | PRN
Start: 2024-04-09 — End: 2024-06-07

## 2024-04-09 NOTE — Progress Notes (Signed)
 Subjective:    Patient ID: Denise [name], female    DOB: 1951/11/11, 73 y.o.   MRN: [id number]  Dizziness Associated symptoms include a rash. Pertinent negatives include no chest pain or weakness.    Denise [name] is a very pleasant 73 y.o. female with a history of hypertension, CHF, hypothyroidism, pituitary adenoma, type 2 diabetes, chronic back pain, anxiety depression, lower extremity edema, iron deficiency anemia who presents today to discuss dizziness and chronic rash.  1) Dizziness: Symptoms began about 2 weeks ago, intermittent. She describes her dizziness as a feeling of unsteadiness that occurs when rising from a seated position or when walking. She will notice brief black spots in her eyes, lasting for a few seconds, when she stands up.   She has noticed intermittent pain behind her left eye, also has pain when moving her eye side to side. Was told by her eye doctor that her "eyes were fine". She does have a family history of cancerous brain tumor in mother.   She drinks plenty of water during the day. She denies changes in medications, supplements, one sided weakness, changes in speech, vaginal bleeding, rectal bleeding, dark tarry stools.   She has not taken her medications today including metoprolol .   She has a personal history of pituitary adenoma, surgically removed but surgeon had to leave a portion. She's not had an updated MRI in years.   2) Chronic Rash: Chronic for the last 1 year, worse in the summer time. The rash is red, broken skin, some pain. She's applied clotrimazole cream with some improvement but no resolve. She's applied Gold Bond powder to keep the skin dry.  BP Readings from Last 3 Encounters:  04/09/24 120/68  02/21/24 104/70  02/04/24 112/69     Review of Systems  Respiratory:  Negative for shortness of breath.   Cardiovascular:  Negative for chest pain.  Gastrointestinal:  Negative for blood in stool.  Genitourinary:  Negative for  vaginal bleeding.  Skin:  Positive for rash.  Neurological:  Positive for dizziness. Negative for syncope, speech difficulty and weakness.         Past Medical History:  Diagnosis Date   Acute non-recurrent sinusitis 06/12/2023   Allergy    seasonal   Arthritis    Benzodiazepine intoxication (HCC)    Breast pain, left 04/18/2020   Bursitis of hip    Left   Chest pain 02/25/2017   CHF (congestive heart failure) (HCC)    Colitis 01/26/2019   Constipation    90%of the time c/o constipation, occ has diarrhea - uses stool softener    Diverticulosis of colon without hemorrhage 01/21/2020   Endometriosis    Facial flushing 05/12/2014   Fibroid    GERD (gastroesophageal reflux disease)    Hematochezia    Hiatal hernia    HSV-1 (herpes simplex virus 1) infection    Hyperlipidemia    Hyperplastic colon polyp    Hypertension    Hypothyroidism    Ischemic colitis (HCC) 01/2019   LBBB (left bundle branch block)    Low back pain    Migraine    Osteopenia 2008   -1.2 FEMORAL NECK   Overdose of benzodiazepine 08/11/2021   Prediabetes    Stroke [facility])    TIA   TIA (transient ischemic attack) 08/10/2013    Social History   Socioeconomic History   Marital status: Divorced    Spouse name: Not on file   Number of children: 2   Years  of education: Not on file   Highest education level: 12th grade  Occupational History    Employer: RETIRED  Tobacco Use   Smoking status: Never    Passive exposure: Past   Smokeless tobacco: Never  Vaping Use   Vaping status: Never Used  Substance and Sexual Activity   Alcohol use: Yes    Alcohol/week: 0.0 standard drinks of alcohol    Comment: 1 every 3 months   Drug use: No   Sexual activity: Not Currently    Comment: intercourse age 31, more than 5 sexual partners,des neg  Other Topics Concern   Not on file  Social History Narrative   Single.   2 daughters, 4 grandchildren.   Once worked for [employer].   Enjoys Doctor, hospital.    Social Drivers of Corporate investment banker Strain: Low Risk  (01/01/2024)   Overall Financial Resource Strain (CARDIA)    Difficulty of Paying Living Expenses: Not very hard  Food Insecurity: No Food Insecurity (01/01/2024)   Hunger Vital Sign    Worried About Running Out of Food in the Last Year: Never true    Ran Out of Food in the Last Year: Never true  Transportation Needs: No Transportation Needs (01/01/2024)   PRAPARE - Administrator, Civil Service (Medical): No    Lack of Transportation (Non-Medical): No  Physical Activity: Unknown (01/01/2024)   Exercise Vital Sign    Days of Exercise per Week: 0 days    Minutes of Exercise per Session: Not on file  Stress: No Stress Concern Present (01/01/2024)   [employer] of Occupational Health - Occupational Stress Questionnaire    Feeling of Stress : Only a little  Social Connections: Socially Isolated (01/01/2024)   Social Connection and Isolation Panel [NHANES]    Frequency of Communication with Friends and Family: Once a week    Frequency of Social Gatherings with Friends and Family: Once a week    Attends Religious Services: 1 to 4 times per year    Active Member of [employer] or Organizations: No    Attends Engineer, structural: Not on file    Marital Status: Divorced  Catering manager Violence: Not on file    Past Surgical History:  Procedure Laterality Date   ABDOMINAL HYSTERECTOMY  1989   TAH, BSO   CHOLECYSTECTOMY  2003   COLONOSCOPY     KNEE SURGERY  2004   right   NASAL SEPTUM SURGERY  2012   PITUITARY SURGERY  2001   POLYPECTOMY     SHOULDER SURGERY  2007   right   THYROID  LOBECTOMY  2002    Family History  Problem Relation Age of Onset   Cancer Mother        brain tumor   Heart disease Father    Parkinson's disease Sister    Breast cancer Maternal Aunt    Colon cancer Neg Hx    Colon polyps Neg Hx    Rectal cancer Neg Hx    Stomach cancer Neg Hx     Thyroid  disease Neg Hx    Esophageal cancer Neg Hx    Pancreatic cancer Neg Hx     Allergies  Allergen Reactions   Tramadol Other (See Comments)    Reaction: NIGHTMARES Other reaction(s): Not available    Current Outpatient Medications on File Prior to Visit  Medication Sig Dispense Refill   alendronate  (FOSAMAX ) 70 MG tablet Take 1 tablet (70 mg  total) by mouth every 7 (seven) days. Take with a full glass of water on an empty stomach. Avoid laying flat for 2 hours. 12 tablet 3   aspirin  81 MG tablet Take 81 mg by mouth daily.     atorvastatin  (LIPITOR) 10 MG tablet Take 1 tablet (10 mg total) by mouth daily. for cholesterol. 90 tablet 0   buPROPion  ER (WELLBUTRIN  SR) 100 MG 12 hr tablet Take 1 tablet (100 mg total) by mouth 2 (two) times daily for depression 180 tablet 0   Cholecalciferol (D3) 10 MCG (400 UNIT) CHEW Chew by mouth.     DULoxetine  (CYMBALTA ) 60 MG capsule TAKE 1 CAPSULE BY MOUTH EVERY DAY TO PREVENT HEADACHE 90 capsule 0   empagliflozin  (JARDIANCE ) 10 MG TABS tablet Take 1 tablet (10 mg total) by mouth daily before breakfast. 90 tablet 3   lansoprazole (EQ LANSOPRAZOLE) 15 MG capsule Take 15 mg by mouth 2 (two) times daily.     levothyroxine  (SYNTHROID ) 75 MCG tablet Take 1 tablet (75 mcg total) by mouth in the morning on an empty stomach with water only. No food/other medications for 1/2 hour 90 tablet 0   magnesium  30 MG tablet Take 30 mg by mouth daily.     methocarbamol (ROBAXIN) 500 MG tablet Take 500 mg by mouth every 6 (six) hours as needed for muscle spasms.     metoprolol  succinate (TOPROL -XL) 50 MG 24 hr tablet Take 1 tablet (50 mg total) by mouth daily. Take with or immediately following a meal. 90 tablet 3   metoprolol  tartrate (LOPRESSOR ) 25 MG tablet Take one (1) tablet by mouth ( 25 mg) Heart Rate greater than 100 or palpitations. (Patient taking differently: Take one (1) tablet by mouth ( 25 mg) Heart Rate greater than 100 or palpitations. As needed.) 30  tablet 6   mirtazapine  (REMERON ) 30 MG tablet Take 1 tablet (30 mg total) by mouth at bedtime for sleep. 90 tablet 0   Multiple Minerals-Vitamins (CALCIUM -MAGNESIUM -ZINC-D3 PO) Take 1 capsule by mouth daily.     nitroGLYCERIN  (NITROSTAT ) 0.4 MG SL tablet Dissolve 1 tablet under the tongue every 5 minutes as needed for chest pain. Max of 3 doses, then 911. 25 tablet 6   sacubitril -valsartan  (ENTRESTO ) 97-103 MG Take 1 tablet by mouth 2 (two) times daily. 180 tablet 3   spironolactone  (ALDACTONE ) 25 MG tablet Take 1 tablet (25 mg total) by mouth daily. 90 tablet 3   torsemide  (DEMADEX ) 20 MG tablet Take 20 mg by mouth daily.     No current facility-administered medications on file prior to visit.    BP 120/68   Pulse (!) 116   Temp 97.9 F (36.6 C) (Oral)   Ht 5' 4.5" (1.638 m)   Wt 254 lb 8 oz (115.4 kg)   SpO2 95%   BMI 43.01 kg/m  Objective:   Physical Exam Eyes:     Extraocular Movements: Extraocular movements intact.  Cardiovascular:     Rate and Rhythm: Normal rate and regular rhythm.  Pulmonary:     Effort: Pulmonary effort is normal.     Breath sounds: Normal breath sounds.  Musculoskeletal:     Cervical back: Neck supple.  Skin:    General: Skin is warm and dry.     Findings: Erythema and rash present.     Comments: Erythema noted under bilateral breasts with minimal skin breakdown noted under left breast.   Neurological:     Mental Status: She is alert and oriented to  person, place, and time.     Cranial Nerves: No cranial nerve deficit.     Coordination: Coordination normal.  Psychiatric:        Mood and Affect: Mood normal.           Assessment & Plan:  Dizziness Assessment & Plan: Neuroexam today unremarkable and reassuring.  Labs pending today including CBC, iron studies, A1c, BMP. Will update MRI brain given prior history of pituitary adenoma with partial surgical removal.  We also discussed to rise slowly from seated positions to avoid  dizziness.  Await results.  Orders: -     MR BRAIN W WO CONTRAST; Future -     CBC -     IBC + Ferritin -     Basic metabolic panel with GFR  Rash and nonspecific skin eruption Assessment & Plan: Exam today representative of yeastlike rash from sweating/heat.  Start nystatin  cream twice daily as needed. Continue to work on keeping the area dry with Goldbond powder.  She will update.  Orders: -     Nystatin ; Apply 1 Application topically 2 (two) times daily as needed for dry skin.  Dispense: 30 g; Refill: 0  Type 2 diabetes mellitus with hyperglycemia, without long-term current use of insulin (HCC) -     Hemoglobin A1c        Gabriel John, NP

## 2024-04-09 NOTE — Assessment & Plan Note (Signed)
 Neuroexam today unremarkable and reassuring.  Labs pending today including CBC, iron studies, A1c, BMP. Will update MRI brain given prior history of pituitary adenoma with partial surgical removal.  We also discussed to rise slowly from seated positions to avoid dizziness.  Await results.

## 2024-04-09 NOTE — Patient Instructions (Signed)
 Stop by the lab prior to leaving today. I will notify you of your results once received.   You will receive a phone call regarding the MRI of your brain.  You can apply the cream twice daily as needed.  Try to keep the areas dry.  It was a pleasure to see you today!

## 2024-04-09 NOTE — Assessment & Plan Note (Signed)
 Exam today representative of yeastlike rash from sweating/heat.  Start nystatin  cream twice daily as needed. Continue to work on keeping the area dry with Goldbond powder.  She will update.

## 2024-04-09 NOTE — Telephone Encounter (Signed)
 No response from pt in regards to pulmonary rehab. Closed referral.

## 2024-04-10 ENCOUNTER — Ambulatory Visit: Admitting: Primary Care

## 2024-04-10 ENCOUNTER — Other Ambulatory Visit: Payer: Self-pay | Admitting: Cardiovascular Disease

## 2024-04-10 DIAGNOSIS — I5032 Chronic diastolic (congestive) heart failure: Secondary | ICD-10-CM

## 2024-04-10 DIAGNOSIS — I447 Left bundle-branch block, unspecified: Secondary | ICD-10-CM

## 2024-04-10 DIAGNOSIS — I10 Essential (primary) hypertension: Secondary | ICD-10-CM

## 2024-04-12 ENCOUNTER — Other Ambulatory Visit: Payer: Self-pay | Admitting: Primary Care

## 2024-04-12 ENCOUNTER — Other Ambulatory Visit: Payer: Self-pay | Admitting: Cardiovascular Disease

## 2024-04-12 DIAGNOSIS — E785 Hyperlipidemia, unspecified: Secondary | ICD-10-CM

## 2024-04-12 DIAGNOSIS — E039 Hypothyroidism, unspecified: Secondary | ICD-10-CM

## 2024-04-12 MED ORDER — ATORVASTATIN CALCIUM 10 MG PO TABS
10.0000 mg | ORAL_TABLET | Freq: Every day | ORAL | 1 refills | Status: DC
  Filled 2024-04-12: qty 90, 90d supply, fill #0
  Filled 2024-08-02: qty 90, 90d supply, fill #1

## 2024-04-12 MED ORDER — LEVOTHYROXINE SODIUM 75 MCG PO TABS
75.0000 ug | ORAL_TABLET | Freq: Every morning | ORAL | 1 refills | Status: DC
  Filled 2024-04-12: qty 90, 90d supply, fill #0
  Filled 2024-08-02: qty 90, 90d supply, fill #1

## 2024-04-13 ENCOUNTER — Other Ambulatory Visit: Payer: Self-pay

## 2024-04-13 ENCOUNTER — Other Ambulatory Visit: Payer: Self-pay | Admitting: Primary Care

## 2024-04-14 ENCOUNTER — Other Ambulatory Visit: Payer: Self-pay

## 2024-04-14 MED FILL — Metoprolol Succinate Tab ER 24HR 50 MG (Tartrate Equiv): ORAL | 90 days supply | Qty: 90 | Fill #0 | Status: AC

## 2024-04-16 ENCOUNTER — Ambulatory Visit: Admitting: Primary Care

## 2024-04-16 DIAGNOSIS — M542 Cervicalgia: Secondary | ICD-10-CM | POA: Diagnosis not present

## 2024-04-16 DIAGNOSIS — M5451 Vertebrogenic low back pain: Secondary | ICD-10-CM | POA: Diagnosis not present

## 2024-04-21 ENCOUNTER — Ambulatory Visit
Admission: RE | Admit: 2024-04-21 | Discharge: 2024-04-21 | Disposition: A | Discharge status: Home / Self Care | Source: Ambulatory Visit | Attending: Primary Care | Admitting: Primary Care

## 2024-04-21 DIAGNOSIS — N958 Other specified menopausal and perimenopausal disorders: Secondary | ICD-10-CM | POA: Diagnosis not present

## 2024-04-21 DIAGNOSIS — E2839 Other primary ovarian failure: Secondary | ICD-10-CM

## 2024-04-21 DIAGNOSIS — M8588 Other specified disorders of bone density and structure, other site: Secondary | ICD-10-CM | POA: Diagnosis not present

## 2024-04-23 ENCOUNTER — Ambulatory Visit
Admission: RE | Admit: 2024-04-23 | Discharge: 2024-04-23 | Disposition: A | Discharge status: Home / Self Care | Source: Ambulatory Visit | Attending: Primary Care | Admitting: Primary Care

## 2024-04-23 DIAGNOSIS — R42 Dizziness and giddiness: Secondary | ICD-10-CM | POA: Diagnosis not present

## 2024-04-23 DIAGNOSIS — R519 Headache, unspecified: Secondary | ICD-10-CM | POA: Diagnosis not present

## 2024-04-23 MED ORDER — GADOPICLENOL 0.5 MMOL/ML IV SOLN
10.0000 mL | Freq: Once | INTRAVENOUS | Status: AC | PRN
  Administered 2024-04-23: 10 mL via INTRAVENOUS

## 2024-04-24 ENCOUNTER — Other Ambulatory Visit: Payer: Self-pay

## 2024-04-24 ENCOUNTER — Emergency Department (HOSPITAL_COMMUNITY)
Admission: EM | Admit: 2024-04-24 | Discharge: 2024-04-25 | Disposition: A | Discharge status: Home / Self Care | Attending: Emergency Medicine | Admitting: Emergency Medicine

## 2024-04-24 ENCOUNTER — Encounter (HOSPITAL_COMMUNITY): Payer: Self-pay | Admitting: *Deleted

## 2024-04-24 DIAGNOSIS — I110 Hypertensive heart disease with heart failure: Secondary | ICD-10-CM | POA: Insufficient documentation

## 2024-04-24 DIAGNOSIS — M4802 Spinal stenosis, cervical region: Secondary | ICD-10-CM | POA: Insufficient documentation

## 2024-04-24 DIAGNOSIS — R0602 Shortness of breath: Secondary | ICD-10-CM | POA: Diagnosis not present

## 2024-04-24 DIAGNOSIS — M5011 Cervical disc disorder with radiculopathy,  high cervical region: Secondary | ICD-10-CM | POA: Diagnosis not present

## 2024-04-24 DIAGNOSIS — Z79899 Other long term (current) drug therapy: Secondary | ICD-10-CM | POA: Insufficient documentation

## 2024-04-24 DIAGNOSIS — M50321 Other cervical disc degeneration at C4-C5 level: Secondary | ICD-10-CM | POA: Diagnosis not present

## 2024-04-24 DIAGNOSIS — M4313 Spondylolisthesis, cervicothoracic region: Secondary | ICD-10-CM | POA: Diagnosis not present

## 2024-04-24 DIAGNOSIS — I10 Essential (primary) hypertension: Secondary | ICD-10-CM | POA: Diagnosis not present

## 2024-04-24 DIAGNOSIS — I509 Heart failure, unspecified: Secondary | ICD-10-CM | POA: Diagnosis not present

## 2024-04-24 DIAGNOSIS — D72829 Elevated white blood cell count, unspecified: Secondary | ICD-10-CM | POA: Diagnosis not present

## 2024-04-24 DIAGNOSIS — E039 Hypothyroidism, unspecified: Secondary | ICD-10-CM | POA: Diagnosis not present

## 2024-04-24 DIAGNOSIS — Z7982 Long term (current) use of aspirin: Secondary | ICD-10-CM | POA: Insufficient documentation

## 2024-04-24 DIAGNOSIS — Z8673 Personal history of transient ischemic attack (TIA), and cerebral infarction without residual deficits: Secondary | ICD-10-CM | POA: Insufficient documentation

## 2024-04-24 DIAGNOSIS — M5030 Other cervical disc degeneration, unspecified cervical region: Secondary | ICD-10-CM

## 2024-04-24 DIAGNOSIS — R531 Weakness: Secondary | ICD-10-CM | POA: Diagnosis not present

## 2024-04-24 NOTE — ED Triage Notes (Signed)
 The pt reports that around 2100 tonight she had both her arms go numb up to her elbows and in her epigastric area  she reports also that those symptoms  went away after a few seconds   she has been feeling weak and foggy brain now but her symptoms have subsided  speech clear  she reports sob but none now

## 2024-04-24 NOTE — ED Triage Notes (Signed)
 She just saw her regular doctor yesterday in the office

## 2024-04-25 ENCOUNTER — Emergency Department (HOSPITAL_COMMUNITY)

## 2024-04-25 DIAGNOSIS — R531 Weakness: Secondary | ICD-10-CM | POA: Diagnosis not present

## 2024-04-25 DIAGNOSIS — M4313 Spondylolisthesis, cervicothoracic region: Secondary | ICD-10-CM | POA: Diagnosis not present

## 2024-04-25 DIAGNOSIS — R0602 Shortness of breath: Secondary | ICD-10-CM | POA: Diagnosis not present

## 2024-04-25 DIAGNOSIS — M4802 Spinal stenosis, cervical region: Secondary | ICD-10-CM | POA: Diagnosis not present

## 2024-04-25 DIAGNOSIS — M5011 Cervical disc disorder with radiculopathy,  high cervical region: Secondary | ICD-10-CM | POA: Diagnosis not present

## 2024-04-25 LAB — BASIC METABOLIC PANEL WITH GFR
Anion gap: 13 (ref 5–15)
BUN: 22 mg/dL (ref 8–23)
CO2: 20 mmol/L — ABNORMAL LOW (ref 22–32)
Calcium: 9.2 mg/dL (ref 8.9–10.3)
Chloride: 105 mmol/L (ref 98–111)
Creatinine, Ser: 1.31 mg/dL — ABNORMAL HIGH (ref 0.44–1.00)
GFR, Estimated: 43 mL/min — ABNORMAL LOW (ref >60)
Glucose, Bld: 127 mg/dL — ABNORMAL HIGH (ref 70–99)
Potassium: 4.2 mmol/L (ref 3.5–5.1)
Sodium: 138 mmol/L (ref 135–145)

## 2024-04-25 LAB — CBC
HCT: 42 % (ref 36.0–46.0)
Hemoglobin: 13.8 g/dL (ref 12.0–15.0)
MCH: 27.2 pg (ref 26.0–34.0)
MCHC: 32.9 g/dL (ref 30.0–36.0)
MCV: 82.8 fL (ref 80.0–100.0)
Platelets: 359 10*3/uL (ref 150–400)
RBC: 5.07 MIL/uL (ref 3.87–5.11)
RDW: 14.7 % (ref 11.5–15.5)
WBC: 12.9 10*3/uL — ABNORMAL HIGH (ref 4.0–10.5)
nRBC: 0 % (ref 0.0–0.2)

## 2024-04-25 LAB — TROPONIN I (HIGH SENSITIVITY)
Troponin I (High Sensitivity): 6 ng/L (ref <18)
Troponin I (High Sensitivity): 7 ng/L (ref <18)

## 2024-04-25 NOTE — ED Provider Notes (Signed)
 [place] EMERGENCY DEPARTMENT AT [facility] Provider Note   CSN: [id number] Arrival date & time: 04/24/24  2313     History  Chief Complaint  Patient presents with   Weakness    Denise [name] is a 73 y.o. female.  The history is provided by the patient.  Patient with extensive history including CHF, previous pituitary adenoma presents with arm weakness  Patient reports around 9:30 PM earlier tonight she was watching television when she had a sensation in her chest and neck and then noted that it went into both of her arms and both of her arms felt weak for around 5 minutes.  She reports she was unable to grip anything with her hands.  There was no leg weakness.  No severe chest or back pain.  Denies any new neck pain, but does report history of "ruptured disc" She also reports she had a headache at the time without vision changes, but reports she has had recent headaches and just had a recent MRI of her brain because she has a previous history of a pit adenoma Patient reports she is back to baseline as symptoms only last around 5 minutes Patient reports she had similar symptoms over 10 years ago at an outside hospital was told she had TIA but does not think she had any intervention   Past Medical History:  Diagnosis Date   Acute non-recurrent sinusitis 06/12/2023   Allergy    seasonal   Arthritis    Benzodiazepine intoxication (HCC)    Breast pain, left 04/18/2020   Bursitis of hip    Left   Chest pain 02/25/2017   CHF (congestive heart failure) (HCC)    Colitis 01/26/2019   Constipation    90%of the time c/o constipation, occ has diarrhea - uses stool softener    Diverticulosis of colon without hemorrhage 01/21/2020   Endometriosis    Facial flushing 05/12/2014   Fibroid    GERD (gastroesophageal reflux disease)    Hematochezia    Hiatal hernia    HSV-1 (herpes simplex virus 1) infection    Hyperlipidemia    Hyperplastic colon polyp    Hypertension     Hypothyroidism    Ischemic colitis (HCC) 01/2019   LBBB (left bundle branch block)    Low back pain    Migraine    Osteopenia 2008   -1.2 FEMORAL NECK   Overdose of benzodiazepine 08/11/2021   Prediabetes    Stroke [facility])    TIA   TIA (transient ischemic attack) 08/10/2013    Home Medications Prior to Admission medications   Medication Sig Start Date End Date Taking? Authorizing Provider  alendronate  (FOSAMAX ) 70 MG tablet Take 1 tablet (70 mg total) by mouth every 7 (seven) days. Take with a full glass of water on an empty stomach. Avoid laying flat for 2 hours. 02/12/23   Clark, Katherine K, NP  aspirin  81 MG tablet Take 81 mg by mouth daily.    [provider]  atorvastatin  (LIPITOR) 10 MG tablet Take 1 tablet (10 mg total) by mouth daily. for cholesterol. 04/12/24   Gabriel John, NP  buPROPion  ER (WELLBUTRIN  SR) 100 MG 12 hr tablet Take 1 tablet (100 mg total) by mouth 2 (two) times daily for depression 12/03/23   Clark, Katherine K, NP  Cholecalciferol (D3) 10 MCG (400 UNIT) CHEW Chew by mouth.    [provider]  DULoxetine  (CYMBALTA ) 60 MG capsule TAKE 1 CAPSULE BY MOUTH EVERY DAY  TO PREVENT HEADACHE 03/20/24   Gabriel John, NP  empagliflozin  (JARDIANCE ) 10 MG TABS tablet Take 1 tablet (10 mg total) by mouth daily before breakfast. 11/05/23   Swinyer, Leilani Punter, NP  lansoprazole (EQ LANSOPRAZOLE) 15 MG capsule Take 15 mg by mouth 2 (two) times daily.    [provider]  levothyroxine  (SYNTHROID ) 75 MCG tablet Take 1 tablet (75 mcg total) by mouth in the morning on an empty stomach with water only. No food/other medications for 1/2 hour 04/12/24   Clark, Katherine K, NP  magnesium  30 MG tablet Take 30 mg by mouth daily.    [provider]  methocarbamol (ROBAXIN) 500 MG tablet Take 500 mg by mouth every 6 (six) hours as needed for muscle spasms. 01/28/24   [provider]  metoprolol  succinate (TOPROL -XL) 50 MG 24 hr tablet Take  1 tablet (50 mg total) by mouth daily. Take with or immediately following a meal. 04/14/24   Swinyer, Leilani Punter, NP  metoprolol  tartrate (LOPRESSOR ) 25 MG tablet Take one (1) tablet by mouth ( 25 mg) Heart Rate greater than 100 or palpitations. Patient taking differently: Take one (1) tablet by mouth ( 25 mg) Heart Rate greater than 100 or palpitations. As needed. 12/25/22   Swinyer, Leilani Punter, NP  mirtazapine  (REMERON ) 30 MG tablet Take 1 tablet (30 mg total) by mouth at bedtime for sleep. 03/04/24   Clark, Katherine K, NP  Multiple Minerals-Vitamins (CALCIUM -MAGNESIUM -ZINC-D3 PO) Take 1 capsule by mouth daily.    [provider]  nitroGLYCERIN  (NITROSTAT ) 0.4 MG SL tablet Dissolve 1 tablet under the tongue every 5 minutes as needed for chest pain. Max of 3 doses, then 911. 08/08/22   [name], Lela Purple, MD  nystatin  cream (MYCOSTATIN ) Apply 1 Application topically 2 (two) times daily as needed for dry skin. 04/09/24   Clark, Katherine K, NP  sacubitril -valsartan  (ENTRESTO ) 97-103 MG Take 1 tablet by mouth 2 (two) times daily. 02/26/24   [name], Lela Purple, MD  spironolactone  (ALDACTONE ) 25 MG tablet TAKE 1 TABLET BY MOUTH EVERY DAY 04/13/24   [name], Lela Purple, MD  torsemide  (DEMADEX ) 20 MG tablet Take 20 mg by mouth daily.    [provider]      Allergies    Tramadol    Review of Systems   Review of Systems  Eyes:  Negative for visual disturbance.  Musculoskeletal:  Negative for back pain.  Neurological:  Positive for weakness and headaches. Negative for speech difficulty.    Physical Exam Updated Vital Signs BP (!) 122/101 (BP Location: Right Arm)   Pulse 76   Temp 98.1 F (36.7 C) (Oral)   Resp 20   Ht 1.651 m (5\' 5" )   Wt 115.4 kg   SpO2 96%   BMI 42.34 kg/m  Physical Exam CONSTITUTIONAL: Well developed/well nourished, no distress HEAD: Normocephalic/atraumatic EYES: EOMI/PERRL, no nystagmus, no ptosis ENMT: Mucous membranes moist NECK: supple no meningeal  signs No C-spine tenderness CV: S1/S2 noted, no murmurs/rubs/gallops noted LUNGS: Lungs are clear to auscultation bilaterally, no apparent distress ABDOMEN: soft, nontender, no rebound or guarding GU:no cva tenderness NEURO:Awake/alert, face symmetric, no arm or leg drift is noted Equal 5/5 strength with shoulder abduction, elbow flex/extension, wrist flex/extension in upper extremities and equal hand grips bilaterally Equal 5/5 strength with hip flexion,knee flex/extension, foot dorsi/plantar flexion Cranial nerves 3/4/5/6/06/24/09/11/12 tested and intact No past pointing Sensation to light touch intact in all extremities EXTREMITIES: pulses normalx4, full ROM SKIN: warm, color normal PSYCH:  no abnormalities of mood noted  ED Results / Procedures / Treatments   Labs (all labs ordered are listed, but only abnormal results are displayed) Labs Reviewed  BASIC METABOLIC PANEL WITH GFR - Abnormal; Notable for the following components:      Result Value   CO2 20 (*)    Glucose, Bld 127 (*)    Creatinine, Ser 1.31 (*)    GFR, Estimated 43 (*)    All other components within normal limits  CBC - Abnormal; Notable for the following components:   WBC 12.9 (*)    All other components within normal limits  TROPONIN I (HIGH SENSITIVITY)  TROPONIN I (HIGH SENSITIVITY)    EKG EKG Interpretation Date/Time:  Friday Apr 24 2024 23:32:25 EDT Ventricular Rate:  98 PR Interval:  164 QRS Duration:  124 QT Interval:  378 QTC Calculation: 482 R Axis:   -45  Text Interpretation: Sinus rhythm Left axis deviation Left bundle branch block Abnormal ECG When compared with ECG of 02-Jan-2024 08:09, No significant change was found Confirmed by Alissa April ([id number]) on 04/24/2024 11:50:54 PM  Radiology DG Chest 2 View Result Date: 04/25/2024 CLINICAL DATA:  Weakness and shortness of breath. EXAM: CHEST - 2 VIEW COMPARISON:  December 12, 2022 FINDINGS: The heart size and mediastinal contours are within  normal limits. There is mild calcification of the aortic arch. There is no evidence of acute infiltrate, pleural effusion or pneumothorax. Radiopaque surgical clips are seen within the right upper quadrant. Multilevel degenerative changes are present throughout the thoracic spine. IMPRESSION: No active cardiopulmonary disease. Electronically Signed   By: Virgle Grime M.D.   On: 04/25/2024 03:07    Procedures Procedures    Medications Ordered in ED Medications - No data to display  ED Course/ Medical Decision Making/ A&P Clinical Course as of 04/25/24 0716  Sat Apr 25, 2024  0318 Creatinine(!): 1.31 Mild renal insufficiency [DW]  0318 WBC(!): 12.9 Mild leukocytosis [DW]  0328 Patient presents for symptoms of bilateral arm weakness for about 5 minutes that has since resolved.  She is now back to baseline in no acute distress.  I have low suspicion for acute vascular or cardiovascular emergency.  Patient claims of had a TIA years ago that was similar in nature, but this would be very unusual presentation for cerebrovascular accident.  Patient mentions history of "ruptured disc" in her neck, symptoms are more consistent with a cervical myelopathy. Plan will be to obtain an MRI C-spine.  Patient just recently had an outpatient MRI brain for intermittent headaches that results are pending Low suspicion for acute CVA at this time [DW]  0710 Handoff from DW pending MRI [SG]  0715 Signed out to Dr. Brunilda Capra at shift change [DW]    Clinical Course User Index [DW] Eldon Greenland, MD [SG] Teddi Favors, DO                                 Medical Decision Making Amount and/or Complexity of Data Reviewed Labs:  Decision-making details documented in ED Course. Radiology: ordered.   This patient presents to the ED for concern of weakness, this involves an extensive number of treatment options, and is a complaint that carries with it a high risk of complications and morbidity.  The  differential diagnosis includes but is not limited to CVA, intracranial hemorrhage, acute coronary syndrome, renal failure, urinary tract infection, electrolyte disturbance, pneumonia, cervical myelopathy,  multiple sclerosis, aortic stenosis    Comorbidities that complicate the patient evaluation: Patient's presentation is complicated by their history of CHF  Social Determinants of Health: Patient's history of depression  increases the complexity of managing their presentation  Additional history obtained: Records reviewed Primary Care Documents  Lab Tests: I Ordered, and personally interpreted labs.  The pertinent results include: Renal insufficiency  Imaging Studies ordered: I ordered imaging studies including X-ray chest  I independently visualized and interpreted imaging which showed no acute findings I agree with the radiologist interpretation  Cardiac Monitoring: The patient was maintained on a cardiac monitor.  I personally viewed and interpreted the cardiac monitor which showed an underlying rhythm of:  sinus rhythm  Reevaluation: After the interventions noted above, I reevaluated the patient and found that they have :stayed the same  Complexity of problems addressed: Patient's presentation is most consistent with  acute presentation with potential threat to life or bodily function           Final Clinical Impression(s) / ED Diagnoses Final diagnoses:  None    Rx / DC Orders ED Discharge Orders     None         Eldon Greenland, MD 04/25/24 [phone]

## 2024-04-25 NOTE — ED Notes (Signed)
 Patient discharged by RN. Patient verbalizes understanding of instructions without additional questions. In wheelchair to lobby to be picked up by family.

## 2024-04-25 NOTE — Discharge Instructions (Addendum)
 It was a pleasure caring for you today in the emergency department.   Please follow up with spine specialist and with neurology   Please return to the emergency department for any worsening or worrisome symptoms.

## 2024-04-25 NOTE — ED Provider Notes (Signed)
  Provider Note MRN:  [id number]  Arrival date & time: 04/25/24    ED Course and Medical Decision Making  Assumed care from Dr Wallis Gun at shift change.  See note from prior team for complete details, in brief:  Clinical Course as of 04/25/24 0823  Sat Apr 25, 2024  0318 Creatinine(!): 1.31 Mild renal insufficiency [DW]  0318 WBC(!): 12.9 Mild leukocytosis [DW]  0328 Patient presents for symptoms of bilateral arm weakness for about 5 minutes that has since resolved.  She is now back to baseline in no acute distress.  I have low suspicion for acute vascular or cardiovascular emergency.  Patient claims of had a TIA years ago that was similar in nature, but this would be very unusual presentation for cerebrovascular accident.  Patient mentions history of "ruptured disc" in her neck, symptoms are more consistent with a cervical myelopathy. Plan will be to obtain an MRI C-spine.  Patient just recently had an outpatient MRI brain for intermittent headaches that results are pending Low suspicion for acute CVA at this time [DW]  0710 Handoff from DW pending MRI [SG]  0715 Signed out to Dr. Brunilda Capra at shift change [DW]    Clinical Course User Index [DW] Eldon Greenland, MD [SG] Russella Courts A, DO   MRI of the C-spine reviewed, she has some chronic foraminal narrowing and spinal stenosis but no acute abnormalities.  She had recent MRI brain outpatient.  She is back to baseline.  Encouraged outpatient follow-up with neurosurgery. She would like referral to nsgy here locally. Advised to f/u with neurology regarding recent mri brain.  Patient in no distress and overall condition improved here in the ED. Detailed discussions were had with the patient/guardian regarding current findings, and need for close f/u with PCP or on call doctor. The patient/guardian has been instructed to return immediately if the symptoms worsen in any way for re-evaluation. Patient/guardian verbalized understanding and is in  agreement with current care plan. All questions answered prior to discharge.    Procedures  Final Clinical Impressions(s) / ED Diagnoses     ICD-10-CM   1. Spinal stenosis of cervical region  M48.02     2. Degenerative disc disease, cervical  M50.30       ED Discharge Orders     None         Discharge Instructions      It was a pleasure caring for you today in the emergency department.  Please return to the emergency department for any worsening or worrisome symptoms.        Teddi Favors, DO 04/25/24 [phone]

## 2024-04-27 ENCOUNTER — Other Ambulatory Visit: Payer: Self-pay | Admitting: Primary Care

## 2024-04-27 DIAGNOSIS — F32A Depression, unspecified: Secondary | ICD-10-CM

## 2024-04-28 ENCOUNTER — Other Ambulatory Visit (HOSPITAL_COMMUNITY): Payer: Self-pay

## 2024-04-28 ENCOUNTER — Other Ambulatory Visit: Payer: Self-pay

## 2024-04-28 MED ORDER — BUPROPION HCL ER (SR) 100 MG PO TB12
100.0000 mg | ORAL_TABLET | Freq: Two times a day (BID) | ORAL | 1 refills | Status: DC
Start: 2024-04-28 — End: 2024-10-27
  Filled 2024-04-28: qty 180, 90d supply, fill #0
  Filled 2024-08-02: qty 180, 90d supply, fill #1

## 2024-04-30 ENCOUNTER — Ambulatory Visit (HOSPITAL_BASED_OUTPATIENT_CLINIC_OR_DEPARTMENT_OTHER): Payer: HMO | Admitting: Nurse Practitioner

## 2024-05-04 DIAGNOSIS — M5451 Vertebrogenic low back pain: Secondary | ICD-10-CM | POA: Diagnosis not present

## 2024-05-04 DIAGNOSIS — M542 Cervicalgia: Secondary | ICD-10-CM | POA: Diagnosis not present

## 2024-05-05 ENCOUNTER — Ambulatory Visit: Payer: Self-pay | Admitting: Primary Care

## 2024-05-12 DIAGNOSIS — M542 Cervicalgia: Secondary | ICD-10-CM | POA: Diagnosis not present

## 2024-05-12 DIAGNOSIS — M5451 Vertebrogenic low back pain: Secondary | ICD-10-CM | POA: Diagnosis not present

## 2024-05-13 ENCOUNTER — Telehealth (HOSPITAL_COMMUNITY): Payer: Self-pay

## 2024-05-13 NOTE — Telephone Encounter (Signed)
 Patient called 5/28 asking about referral, went over PR program. Reopened referral, will verify insurance and call to schedule.

## 2024-05-14 ENCOUNTER — Other Ambulatory Visit: Payer: Self-pay | Admitting: Endocrinology

## 2024-05-14 ENCOUNTER — Ambulatory Visit: Admitting: Endocrinology

## 2024-05-14 ENCOUNTER — Encounter: Payer: Self-pay | Admitting: Endocrinology

## 2024-05-14 VITALS — BP 110/72 | HR 83 | Resp 18 | Ht 65.0 in | Wt 256.4 lb

## 2024-05-14 DIAGNOSIS — E559 Vitamin D deficiency, unspecified: Secondary | ICD-10-CM

## 2024-05-14 DIAGNOSIS — R748 Abnormal levels of other serum enzymes: Secondary | ICD-10-CM | POA: Diagnosis not present

## 2024-05-14 DIAGNOSIS — E1165 Type 2 diabetes mellitus with hyperglycemia: Secondary | ICD-10-CM | POA: Diagnosis not present

## 2024-05-14 DIAGNOSIS — Z7984 Long term (current) use of oral hypoglycemic drugs: Secondary | ICD-10-CM

## 2024-05-14 DIAGNOSIS — Z9889 Other specified postprocedural states: Secondary | ICD-10-CM

## 2024-05-14 DIAGNOSIS — M810 Age-related osteoporosis without current pathological fracture: Secondary | ICD-10-CM

## 2024-05-14 DIAGNOSIS — E890 Postprocedural hypothyroidism: Secondary | ICD-10-CM

## 2024-05-14 MED ORDER — LANCETS MISC. MISC: 1.0000 | Freq: Every day | 3 refills | Status: AC

## 2024-05-14 MED ORDER — BLOOD GLUCOSE MONITORING SUPPL DEVI: 1.0000 | Freq: Every day | 0 refills | Status: DC

## 2024-05-14 MED ORDER — EMPAGLIFLOZIN 25 MG PO TABS: 25.0000 mg | ORAL_TABLET | Freq: Every day | ORAL | 3 refills | Status: AC

## 2024-05-14 MED ORDER — LANCET DEVICE MISC: 1.0000 | Freq: Three times a day (TID) | 0 refills | Status: DC

## 2024-05-14 MED ORDER — BLOOD GLUCOSE TEST VI STRP
1.0000 | ORAL_STRIP | Freq: Every day | 3 refills | Status: DC
Start: 2024-05-14 — End: 2024-05-14

## 2024-05-14 NOTE — Patient Instructions (Signed)
 Increase jardiacne to 25 mg.  Check glucose daily in the morning and fasting.  Lab for elevated alkaline phosphatase.

## 2024-05-14 NOTE — Progress Notes (Signed)
 Outpatient Endocrinology Note [place] Smita Lesh, MD   Patient's Name: Denise [name]    DOB: May 26, 1951    MRN: [id number]                                                    REASON OF VISIT: New consult for her alkaline phosphatase.  She also wants to be seen for type 2 diabetes mellitus.  REFERRING PROVIDER: Gabriel John, NP  PCP: Gabriel John, NP  HISTORY OF PRESENT ILLNESS:   Denise [name] is a 73 y.o. old female with past medical history listed below, is here for new consult for type elevated alkaline phosphatase.  She wants to be seen for type II diabetes mellitus as well.   # Elevated alkaline phosphatase : - Patient is noted to have elevated alkaline phosphatase, based on chart review at least from 2013,  in the range of 145-220 range.  Alkaline phosphatase in January 2025 was 193 with upper normal limit of 117.  Normal liver enzymes including GGT. She has history of cholecystectomy in 2003, no other abdominal surgeries.  She has no fall and fracture.  She has low back pain due to degenerative changes/osteoarthritis, has been following with physical therapy.  # She has osteoporosis, x-ray scan in May 2025 with lowest T-score of -2.5 at L2-L3, on Fosamax , managed by primary care provider.  Has been taking vitamin D  and calcium  supplement.  Does not recall the exact dose probably taking vitamin D3 500 units daily.  Pertinent Diabetes History: Patient was diagnosed with type 2 diabetes mellitus with hemoglobin A1c of 6.9% in January 2025.  She tried Mounjaro  low-dose p.o. times continue to have GI intolerance with nausea and vomiting and not able to continue on it.  She reports she was prediabetic diagnosed in 2020.  Hemoglobin A1c improved to 6.4% in April 2025.  No personal history of pancreatitis and / or family history of medullary thyroid  carcinoma or MEN 2B syndrome.   Chronic Diabetes Complications : Retinopathy: unknown. Last ophthalmology exam was done on ?,  following with ophthalmology regularly.  Nephropathy: CKD III, on ACE/ARB / valsartan  Peripheral neuropathy: no Coronary artery disease: no Stroke: yes  Relevant comorbidities and cardiovascular risk factors: Obesity: yes Body mass index is 42.67 kg/m.  Hypertension: Yes  Hyperlipidemia : Yes, on statin   Current / Home Diabetic regimen includes:  Jardiance  10 mg daily.  Jardiance  was started by cardiology for having history of heart failure.  Prior diabetic medications: Mounjaro  tried initially stopped due to GI intolerance on low-dose.  Glycemic data:   Patient has not been monitoring blood sugar at home.  Hypoglycemia: Patient has no hypoglycemic episodes. Patient has hypoglycemia awareness.  Factors modifying glucose control: 1.  Diabetic diet assessment: 3 meals a day.  2.  Staying active or exercising: No formal exercise, limited exercise due to low back pain.  She has been doing physical therapy.  3.  Medication compliance: compliant all of the time.  Vit D 500 Units daily, calcium   Left thyroidectomy in 2000s, thyroid  nodule.   # Patient states history of having left thyroid  lobectomy in early 2000, has been on thyroid  hormone replacement since the surgery.  Thyroid  surgery was done for thyroid  nodules and reportedly benign.  She has been taking levothyroxine  75 mcg daily.  Normal thyroid  function  test TSH of 1.19 in February 2025.  Managed by primary care provider at this time.  REVIEW OF SYSTEMS As per history of present illness.   PAST MEDICAL HISTORY: Past Medical History:  Diagnosis Date   Acute non-recurrent sinusitis 06/12/2023   Allergy    seasonal   Arthritis    Benzodiazepine intoxication (HCC)    Breast pain, left 04/18/2020   Bursitis of hip    Left   Chest pain 02/25/2017   CHF (congestive heart failure) (HCC)    Colitis 01/26/2019   Constipation    90%of the time c/o constipation, occ has diarrhea - uses stool softener    Diverticulosis  of colon without hemorrhage 01/21/2020   Endometriosis    Facial flushing 05/12/2014   Fibroid    GERD (gastroesophageal reflux disease)    Hematochezia    Hiatal hernia    HSV-1 (herpes simplex virus 1) infection    Hyperlipidemia    Hyperplastic colon polyp    Hypertension    Hypothyroidism    Ischemic colitis (HCC) 01/2019   LBBB (left bundle branch block)    Low back pain    Migraine    Osteopenia 2008   -1.2 FEMORAL NECK   Overdose of benzodiazepine 08/11/2021   Prediabetes    Stroke (HCC)    TIA   TIA (transient ischemic attack) 08/10/2013    PAST SURGICAL HISTORY: Past Surgical History:  Procedure Laterality Date   ABDOMINAL HYSTERECTOMY  1989   TAH, BSO   CHOLECYSTECTOMY  2003   COLONOSCOPY     KNEE SURGERY  2004   right   NASAL SEPTUM SURGERY  2012   PITUITARY SURGERY  2001   POLYPECTOMY     SHOULDER SURGERY  2007   right   THYROID  LOBECTOMY  2002    ALLERGIES: Allergies  Allergen Reactions   Tramadol Other (See Comments)    Reaction: NIGHTMARES Other reaction(s): Not available    FAMILY HISTORY:  Family History  Problem Relation Age of Onset   Cancer Mother        brain tumor   Heart disease Father    Parkinson's disease Sister    Breast cancer Maternal Aunt    Colon cancer Neg Hx    Colon polyps Neg Hx    Rectal cancer Neg Hx    Stomach cancer Neg Hx    Thyroid  disease Neg Hx    Esophageal cancer Neg Hx    Pancreatic cancer Neg Hx     SOCIAL HISTORY: Social History   Socioeconomic History   Marital status: Divorced    Spouse name: Not on file   Number of children: 2   Years of education: Not on file   Highest education level: 12th grade  Occupational History    Employer: RETIRED  Tobacco Use   Smoking status: Never    Passive exposure: Past   Smokeless tobacco: Never  Vaping Use   Vaping status: Never Used  Substance and Sexual Activity   Alcohol use: Yes    Alcohol/week: 0.0 standard drinks of alcohol    Comment: 1  every 3 months   Drug use: No   Sexual activity: Not Currently    Comment: intercourse age 89, more than 5 sexual partners,des neg  Other Topics Concern   Not on file  Social History Narrative   Single.   2 daughters, 4 grandchildren.   Once worked for [employer].   Enjoys Catering manager.  Social Drivers of Corporate investment banker Strain: Low Risk  (01/01/2024)   Overall Financial Resource Strain (CARDIA)    Difficulty of Paying Living Expenses: Not very hard  Food Insecurity: No Food Insecurity (01/01/2024)   Hunger Vital Sign    Worried About Running Out of Food in the Last Year: Never true    Ran Out of Food in the Last Year: Never true  Transportation Needs: No Transportation Needs (01/01/2024)   PRAPARE - Administrator, Civil Service (Medical): No    Lack of Transportation (Non-Medical): No  Physical Activity: Unknown (01/01/2024)   Exercise Vital Sign    Days of Exercise per Week: 0 days    Minutes of Exercise per Session: Not on file  Stress: No Stress Concern Present (01/01/2024)   [employer] of Occupational Health - Occupational Stress Questionnaire    Feeling of Stress : Only a little  Social Connections: Socially Isolated (01/01/2024)   Social Connection and Isolation Panel [NHANES]    Frequency of Communication with Friends and Family: Once a week    Frequency of Social Gatherings with Friends and Family: Once a week    Attends Religious Services: 1 to 4 times per year    Active Member of [employer] or Organizations: No    Attends Engineer, structural: Not on file    Marital Status: Divorced    MEDICATIONS:  Current Outpatient Medications  Medication Sig Dispense Refill   alendronate  (FOSAMAX ) 70 MG tablet Take 1 tablet (70 mg total) by mouth every 7 (seven) days. Take with a full glass of water on an empty stomach. Avoid laying flat for 2 hours. 12 tablet 3   aspirin  81 MG tablet Take 81 mg by mouth daily.      atorvastatin  (LIPITOR) 10 MG tablet Take 1 tablet (10 mg total) by mouth daily. for cholesterol. 90 tablet 1   Blood Glucose Monitoring Suppl DEVI 1 each by Does not apply route daily. May substitute to any manufacturer covered by patient's insurance. 1 each 0   buPROPion  ER (WELLBUTRIN  SR) 100 MG 12 hr tablet Take 1 tablet (100 mg total) by mouth 2 (two) times daily for depression 180 tablet 1   DULoxetine  (CYMBALTA ) 60 MG capsule TAKE 1 CAPSULE BY MOUTH EVERY DAY TO PREVENT HEADACHE 90 capsule 0   Glucose Blood (BLOOD GLUCOSE TEST STRIPS) STRP 1 each by In Vitro route daily. May substitute to any manufacturer covered by patient's insurance. 100 each 3   Lancet Device MISC 1 each by Does not apply route in the morning, at noon, and at bedtime. May substitute to any manufacturer covered by patient's insurance. 1 each 0   Lancets Misc. MISC 1 each by Does not apply route daily. May substitute to any manufacturer covered by patient's insurance. 100 each 3   lansoprazole (EQ LANSOPRAZOLE) 15 MG capsule Take 15 mg by mouth 2 (two) times daily.     levothyroxine  (SYNTHROID ) 75 MCG tablet Take 1 tablet (75 mcg total) by mouth in the morning on an empty stomach with water only. No food/other medications for 1/2 hour 90 tablet 1   magnesium  30 MG tablet Take 30 mg by mouth daily.     methocarbamol (ROBAXIN) 500 MG tablet Take 500 mg by mouth every 6 (six) hours as needed for muscle spasms.     metoprolol  succinate (TOPROL -XL) 50 MG 24 hr tablet Take 1 tablet (50 mg total) by mouth daily. Take with or immediately following  a meal. 90 tablet 3   metoprolol  tartrate (LOPRESSOR ) 25 MG tablet Take one (1) tablet by mouth ( 25 mg) Heart Rate greater than 100 or palpitations. (Patient taking differently: Take one (1) tablet by mouth ( 25 mg) Heart Rate greater than 100 or palpitations. As needed.) 30 tablet 6   mirtazapine  (REMERON ) 30 MG tablet Take 1 tablet (30 mg total) by mouth at bedtime for sleep. 90 tablet 0    Multiple Minerals-Vitamins (CALCIUM -MAGNESIUM -ZINC-D3 PO) Take 1 capsule by mouth daily.     nitroGLYCERIN  (NITROSTAT ) 0.4 MG SL tablet Dissolve 1 tablet under the tongue every 5 minutes as needed for chest pain. Max of 3 doses, then 911. 25 tablet 6   nystatin  cream (MYCOSTATIN ) Apply 1 Application topically 2 (two) times daily as needed for dry skin. 30 g 0   sacubitril -valsartan  (ENTRESTO ) 97-103 MG Take 1 tablet by mouth 2 (two) times daily. 180 tablet 3   spironolactone  (ALDACTONE ) 25 MG tablet TAKE 1 TABLET BY MOUTH EVERY DAY 90 tablet 3   torsemide  (DEMADEX ) 20 MG tablet Take 20 mg by mouth daily.     empagliflozin  (JARDIANCE ) 25 MG TABS tablet Take 1 tablet (25 mg total) by mouth daily before breakfast. 90 tablet 3   No current facility-administered medications for this visit.    PHYSICAL EXAM: Vitals:   05/14/24 1425  BP: 110/72  Pulse: 83  Resp: 18  SpO2: 95%  Weight: 256 lb 6.4 oz (116.3 kg)  Height: 5\' 5"  (1.651 m)   Body mass index is 42.67 kg/m.  Wt Readings from Last 3 Encounters:  05/14/24 256 lb 6.4 oz (116.3 kg)  04/24/24 254 lb 6.6 oz (115.4 kg)  04/09/24 254 lb 8 oz (115.4 kg)    General: Well developed, well nourished female in no apparent distress.  HEENT: AT/[place], no external lesions.  Eyes: Conjunctiva clear and no icterus. Neck: Neck supple  Lungs: Respirations not labored Neurologic: Alert, oriented, normal speech Extremities / Skin: Dry.  No spine tenderness. Psychiatric: Does not appear depressed or anxious  Diabetic Foot Exam - Simple   No data filed     LABS Reviewed Lab Results  Component Value Date   HGBA1C 6.4 04/09/2024   HGBA1C 6.9 (H) 01/01/2024   HGBA1C 6.4 09/27/2022   No results found for: "FRUCTOSAMINE" Lab Results  Component Value Date   CHOL 119 01/01/2024   HDL 39.10 01/01/2024   LDLCALC 47 01/01/2024   LDLDIRECT 72.0 06/23/2021   TRIG 166.0 (H) 01/01/2024   CHOLHDL 3 01/01/2024   No results found for:  "MICRALBCREAT" Lab Results  Component Value Date   CREATININE 1.31 (H) 04/24/2024   Lab Results  Component Value Date   GFR 54.23 (L) 04/09/2024    ASSESSMENT / PLAN  1. Elevated serum alkaline phosphatase level   2. Age-related osteoporosis without current pathological fracture   3. Vitamin D deficiency   4. Type 2 diabetes mellitus with hyperglycemia, without long-term current use of insulin (HCC)   5. Postsurgical hypothyroidism   6. History of thyroid  surgery    # Elevated alkaline phosphatase : - Unclear reason at this time.  She had normal liver enzymes and GGT in the past. - I would like to check bone specific alkaline phosphatase.  Will repeat CMP containing alkaline phosphatase and liver enzymes and GGT level. - Will check vitamin D level.  Vitamin D deficiency can also cause elevated alkaline phosphatase. - I would also like to check phosphorus and magnesium . -  Will consider bone scan if other causes for elevated alkaline phosphatase is negative to rule out for Paget's disease.  Diabetes Mellitus type 2, complicated by no known complication. - Diabetic status / severity: Controlled.  Lab Results  Component Value Date   HGBA1C 6.4 04/09/2024    - Hemoglobin A1c goal : <6.5%  Patient is newly diagnosed type 2 diabetes mellitus in January 2025.  She had prediabetes for couple of years prior to that.  She had tried Mounjaro  GLP-1 receptor agonist with low-dose has GI intolerance and is stopped.  Discussed about trying other GLP-1 receptor agonist including Ozempic or Trulicity, patient wants to hold off on it for now.  GLP-1 receptor will have additional benefit of weight loss and also cardiovascular benefit.  She is currently on Jardiance  started by cardiology for having history of diastolic heart failure.  This will also help with her diabetes management.  She has CKD 3 as well.  - Medications: See below.  I) increase Jardiance  from 10 to 25 mg daily.  - Home  glucose testing: Advised to check at least in the morning fasting.  Test supplies including glucometer prescribed.  - Discussed/ Gave Hypoglycemia treatment plan.  # Consult : not required at this time.   # Annual urine for microalbuminuria/ creatinine ratio, no microalbuminuria currently, continue ACE/ARB /valsartan .  Recheck in the future visit. Last No results found for: "MICRALBCREAT"  # Foot check nightly.  # Annual dilated diabetic eye exams.   - Diet: Make healthy diabetic food choices. - Life style / activity / exercise: Discussed.  2. Blood pressure  -  BP Readings from Last 1 Encounters:  05/14/24 110/72    - Control is in target.  - No change in current plans.  3. Lipid status / Hyperlipidemia - Last  Lab Results  Component Value Date   LDLCALC 47 01/01/2024   - Continue atorvastatin  10 mg daily.  Managed by primary care provider.  # Osteoporosis on Fosamax , managed by primary care provider.  She is currently on calcium  and vitamin D  supplement. # Postsurgical hypothyroidism, status post left lobectomy in early 2000, managed by primary care provider.  On levothyroxine  75 mcg daily.  Diagnoses and all orders for this visit:  Elevated serum alkaline phosphatase level -     Comprehensive metabolic panel with GFR -     Phosphorus -     Magnesium  -     Parathyroid  hormone, intact (no Ca) -     VITAMIN D  25 Hydroxy (Vit-D Deficiency, Fractures) -     Alkaline phosphatase, bone specific -     Gamma GT  Age-related osteoporosis without current pathological fracture  Vitamin D  deficiency -     VITAMIN D  25 Hydroxy (Vit-D Deficiency, Fractures)  Type 2 diabetes mellitus with hyperglycemia, without long-term current use of insulin (HCC) -     empagliflozin  (JARDIANCE ) 25 MG TABS tablet; Take 1 tablet (25 mg total) by mouth daily before breakfast. -     Blood Glucose Monitoring Suppl DEVI; 1 each by Does not apply route daily. May substitute to any manufacturer  covered by patient's insurance. -     Glucose Blood (BLOOD GLUCOSE TEST STRIPS) STRP; 1 each by In Vitro route daily. May substitute to any manufacturer covered by patient's insurance. -     Lancet Device MISC; 1 each by Does not apply route in the morning, at noon, and at bedtime. May substitute to any manufacturer covered by patient's insurance. -  Lancets Misc. MISC; 1 each by Does not apply route daily. May substitute to any manufacturer covered by patient's insurance.  Postsurgical hypothyroidism  History of thyroid  surgery    DISPOSITION Follow up in clinic in 3 months suggested.  Labs today.   All questions answered and patient verbalized understanding of the plan.  [place]raq Claborn Janusz, MD [facility] Endocrinology [facility] [address] [place], [address] [place], [place] [address] Phone # [phone]  At least part of this note was generated using voice recognition software. Inadvertent word errors may have occurred, which were not recognized during the proofreading process.

## 2024-05-15 ENCOUNTER — Ambulatory Visit: Payer: Self-pay | Admitting: Endocrinology

## 2024-05-18 DIAGNOSIS — M542 Cervicalgia: Secondary | ICD-10-CM | POA: Diagnosis not present

## 2024-05-18 DIAGNOSIS — M5451 Vertebrogenic low back pain: Secondary | ICD-10-CM | POA: Diagnosis not present

## 2024-05-25 ENCOUNTER — Encounter (HOSPITAL_BASED_OUTPATIENT_CLINIC_OR_DEPARTMENT_OTHER): Payer: Self-pay

## 2024-05-25 DIAGNOSIS — M816 Localized osteoporosis [Lequesne]: Secondary | ICD-10-CM

## 2024-05-26 DIAGNOSIS — M5451 Vertebrogenic low back pain: Secondary | ICD-10-CM | POA: Diagnosis not present

## 2024-05-26 DIAGNOSIS — M5459 Other low back pain: Secondary | ICD-10-CM | POA: Diagnosis not present

## 2024-05-26 MED ORDER — ALENDRONATE SODIUM 70 MG PO TABS: 70.0000 mg | ORAL_TABLET | ORAL | 1 refills | Status: DC

## 2024-05-29 ENCOUNTER — Telehealth (HOSPITAL_COMMUNITY): Payer: Self-pay

## 2024-05-29 NOTE — Telephone Encounter (Signed)
 Returned pt VM message about pulmonary rehab, left pt a message to call back.

## 2024-05-31 ENCOUNTER — Other Ambulatory Visit: Payer: Self-pay | Admitting: Primary Care

## 2024-05-31 DIAGNOSIS — G479 Sleep disorder, unspecified: Secondary | ICD-10-CM

## 2024-06-06 ENCOUNTER — Other Ambulatory Visit: Payer: Self-pay | Admitting: Primary Care

## 2024-06-06 DIAGNOSIS — R21 Rash and other nonspecific skin eruption: Secondary | ICD-10-CM

## 2024-06-08 DIAGNOSIS — M542 Cervicalgia: Secondary | ICD-10-CM | POA: Diagnosis not present

## 2024-06-08 DIAGNOSIS — M5451 Vertebrogenic low back pain: Secondary | ICD-10-CM | POA: Diagnosis not present

## 2024-06-12 ENCOUNTER — Encounter (INDEPENDENT_AMBULATORY_CARE_PROVIDER_SITE_OTHER): Payer: Self-pay

## 2024-06-12 DIAGNOSIS — E1165 Type 2 diabetes mellitus with hyperglycemia: Secondary | ICD-10-CM

## 2024-06-15 DIAGNOSIS — M5451 Vertebrogenic low back pain: Secondary | ICD-10-CM | POA: Diagnosis not present

## 2024-06-15 DIAGNOSIS — E1165 Type 2 diabetes mellitus with hyperglycemia: Secondary | ICD-10-CM

## 2024-06-15 DIAGNOSIS — Z7985 Long-term (current) use of injectable non-insulin antidiabetic drugs: Secondary | ICD-10-CM

## 2024-06-15 DIAGNOSIS — M542 Cervicalgia: Secondary | ICD-10-CM | POA: Diagnosis not present

## 2024-06-16 DIAGNOSIS — M545 Low back pain, unspecified: Secondary | ICD-10-CM | POA: Diagnosis not present

## 2024-06-16 MED ORDER — OZEMPIC (0.25 OR 0.5 MG/DOSE) 2 MG/3ML ~~LOC~~ SOPN: PEN_INJECTOR | SUBCUTANEOUS | 0 refills | Status: DC

## 2024-06-16 NOTE — Telephone Encounter (Signed)
Please see the MyChart message reply(ies) for my assessment and plan.  The patient gave consent for this Medical Advice Message and is aware that it may result in a bill to their insurance company as well as the possibility that this may result in a co-payment or deductible. They are an established patient, but are not seeking medical advice exclusively about a problem treated during an in person or video visit in the last 7 days. I did not recommend an in person or video visit within 7 days of my reply.  I spent a total of 11 minutes cumulative time within 7 days through [place], NP

## 2024-06-17 ENCOUNTER — Telehealth: Payer: Self-pay

## 2024-06-17 ENCOUNTER — Other Ambulatory Visit (HOSPITAL_COMMUNITY): Payer: Self-pay

## 2024-06-17 DIAGNOSIS — M25561 Pain in right knee: Secondary | ICD-10-CM | POA: Diagnosis not present

## 2024-06-17 DIAGNOSIS — M545 Low back pain, unspecified: Secondary | ICD-10-CM | POA: Diagnosis not present

## 2024-06-17 NOTE — Telephone Encounter (Signed)
 Pharmacy Patient Advocate Encounter  Received notification from [facility] ADVANT[employer]/RX [employer]VANCE that Prior Authorization for Ozempic (0.25 or 0.5 MG/DOSE) 2MG /3ML pen-injectors  has been APPROVED from 06/17/24 to 06/17/25. Ran test claim, Copay is $0. This test claim was processed through [facility]- copay amounts may vary at other pharmacies due to pharmacy/plan contracts, or as the patient moves through the different stages of their insurance plan.   PA #/Case ID/Reference #: [id number]

## 2024-06-17 NOTE — Telephone Encounter (Signed)
 Pharmacy Patient Advocate Encounter   Received notification from CoverMyMeds that prior authorization for Ozempic (0.25 or 0.5 MG/DOSE) 2MG /3ML pen-injectors is required/requested.   Insurance verification completed.   The patient is insured through [facility] ADVANTAGE/RX ADVANCE .   Per test claim: PA required; PA started via CoverMyMeds. KEY [id number] . Waiting for clinical questions to populate.

## 2024-06-22 DIAGNOSIS — M5451 Vertebrogenic low back pain: Secondary | ICD-10-CM | POA: Diagnosis not present

## 2024-06-29 ENCOUNTER — Telehealth (HOSPITAL_COMMUNITY): Payer: Self-pay

## 2024-06-29 ENCOUNTER — Encounter (HOSPITAL_COMMUNITY): Payer: Self-pay

## 2024-06-29 NOTE — Telephone Encounter (Signed)
 Called patient to see if she was interested in participating in the Pulmonary Rehab Program. Patient will come in for orientation on 07/15/24 and will attend the 1:15 exercise class.  Pensions consultant.

## 2024-06-30 DIAGNOSIS — M5416 Radiculopathy, lumbar region: Secondary | ICD-10-CM | POA: Diagnosis not present

## 2024-07-02 ENCOUNTER — Other Ambulatory Visit: Payer: Self-pay | Admitting: Otolaryngology

## 2024-07-02 DIAGNOSIS — J358 Other chronic diseases of tonsils and adenoids: Secondary | ICD-10-CM

## 2024-07-02 DIAGNOSIS — R059 Cough, unspecified: Secondary | ICD-10-CM | POA: Diagnosis not present

## 2024-07-02 DIAGNOSIS — J351 Hypertrophy of tonsils: Secondary | ICD-10-CM | POA: Diagnosis not present

## 2024-07-02 DIAGNOSIS — R221 Localized swelling, mass and lump, neck: Secondary | ICD-10-CM | POA: Diagnosis not present

## 2024-07-02 DIAGNOSIS — J340 Abscess, furuncle and carbuncle of nose: Secondary | ICD-10-CM | POA: Diagnosis not present

## 2024-07-03 ENCOUNTER — Telehealth (HOSPITAL_COMMUNITY): Payer: Self-pay

## 2024-07-03 ENCOUNTER — Ambulatory Visit
Admission: RE | Admit: 2024-07-03 | Discharge: 2024-07-03 | Disposition: A | Discharge status: Home / Self Care | Source: Ambulatory Visit | Attending: Otolaryngology | Admitting: Otolaryngology

## 2024-07-03 DIAGNOSIS — M4312 Spondylolisthesis, cervical region: Secondary | ICD-10-CM | POA: Diagnosis not present

## 2024-07-03 DIAGNOSIS — Z9009 Acquired absence of other part of head and neck: Secondary | ICD-10-CM | POA: Diagnosis not present

## 2024-07-03 DIAGNOSIS — J358 Other chronic diseases of tonsils and adenoids: Secondary | ICD-10-CM

## 2024-07-03 DIAGNOSIS — J3489 Other specified disorders of nose and nasal sinuses: Secondary | ICD-10-CM | POA: Diagnosis not present

## 2024-07-03 DIAGNOSIS — M47812 Spondylosis without myelopathy or radiculopathy, cervical region: Secondary | ICD-10-CM | POA: Diagnosis not present

## 2024-07-03 MED ORDER — IOPAMIDOL (ISOVUE-300) INJECTION 61%
75.0000 mL | Freq: Once | INTRAVENOUS | Status: AC | PRN
  Administered 2024-07-03: 75 mL via INTRAVENOUS

## 2024-07-03 NOTE — Telephone Encounter (Signed)
 Pt insurance is active and benefits verified through [employer]. Co-pay $5, DED $0/$0 met, out of pocket $3,400/$844.21 met, co-insurance 0%. No pre-authorization required. 07/03/2024 @ 9:30am, spoke with Lona, REF# [id number].

## 2024-07-05 DIAGNOSIS — S0086XA Insect bite (nonvenomous) of other part of head, initial encounter: Secondary | ICD-10-CM | POA: Diagnosis not present

## 2024-07-05 DIAGNOSIS — W57XXXA Bitten or stung by nonvenomous insect and other nonvenomous arthropods, initial encounter: Secondary | ICD-10-CM | POA: Diagnosis not present

## 2024-07-05 DIAGNOSIS — L089 Local infection of the skin and subcutaneous tissue, unspecified: Secondary | ICD-10-CM | POA: Diagnosis not present

## 2024-07-06 ENCOUNTER — Other Ambulatory Visit: Payer: Self-pay | Admitting: Primary Care

## 2024-07-06 DIAGNOSIS — G43009 Migraine without aura, not intractable, without status migrainosus: Secondary | ICD-10-CM

## 2024-07-07 ENCOUNTER — Encounter: Payer: Self-pay | Admitting: Primary Care

## 2024-07-07 ENCOUNTER — Ambulatory Visit (INDEPENDENT_AMBULATORY_CARE_PROVIDER_SITE_OTHER): Admitting: Primary Care

## 2024-07-07 VITALS — BP 118/68 | HR 107 | Temp 97.6°F | Ht 65.0 in | Wt 254.0 lb

## 2024-07-07 DIAGNOSIS — L03211 Cellulitis of face: Secondary | ICD-10-CM | POA: Insufficient documentation

## 2024-07-07 MED ORDER — CEPHALEXIN 500 MG PO CAPS: 500.0000 mg | ORAL_CAPSULE | Freq: Four times a day (QID) | ORAL | 0 refills | Status: DC

## 2024-07-07 NOTE — Patient Instructions (Signed)
 We increased your dose of cephalexin  antibiotic.  Take 1 pill by mouth 4 times daily for a total of 7 days.  Please update me Friday of this week as discussed.  It was a pleasure to see you today!

## 2024-07-07 NOTE — Assessment & Plan Note (Signed)
 Exam today representative.  Increase cephalexin  to 500 mg 4 times daily for total of 7 days. New prescription provided.  She will update via MyChart in 3 days.

## 2024-07-07 NOTE — Progress Notes (Signed)
 Subjective:    Patient ID: Denise [name], female    DOB: [birth year], 73 y.o.   MRN: [id number]  HPI  Denise [name] is a very pleasant 73 y.o. female with a history of CHF, migraines, hypertension, hypothyroidism, type 2 diabetes, osteoporosis, anemia, lower extremity edema who presents today to discuss facial swelling.  Symptom onset four days ago with swelling, erythema, and discomfort to the skin of the left side of her face at the jaw line. She suspects she was bitten by an insect, never saw the insect.   Evaluated at [facility] two days ago for the same symptoms. She was diagnosed with cellulitis and treated with cephalexin  500 mg BID for 5 day course. She's taken 4 pills total of her cephalexin  and has noticed a very slight improvement.   She denies fevers, throat tightness, wheezing, shortness of breath.   Review of Systems  Constitutional:  Negative for fever.  Respiratory:  Negative for shortness of breath.   Cardiovascular:  Negative for chest pain.  Skin:  Positive for color change and wound.         Past Medical History:  Diagnosis Date   Acute non-recurrent sinusitis 06/12/2023   Allergy    seasonal   Arthritis    Benzodiazepine intoxication (HCC)    Breast pain, left 04/18/2020   Bursitis of hip    Left   Chest pain 02/25/2017   CHF (congestive heart failure) (HCC)    Colitis 01/26/2019   Constipation    90%of the time c/o constipation, occ has diarrhea - uses stool softener    Diverticulosis of colon without hemorrhage 01/21/2020   Endometriosis    Facial flushing 05/12/2014   Fibroid    GERD (gastroesophageal reflux disease)    Hematochezia    Hiatal hernia    HSV-1 (herpes simplex virus 1) infection    Hyperlipidemia    Hyperplastic colon polyp    Hypertension    Hypothyroidism    Ischemic colitis (HCC) 01/2019   LBBB (left bundle branch block)    Low back pain    Migraine    Osteopenia 2008   -1.2 FEMORAL NECK   Overdose of  benzodiazepine 08/11/2021   Prediabetes    Stroke [facility])    TIA   TIA (transient ischemic attack) 08/10/2013    Social History   Socioeconomic History   Marital status: Divorced    Spouse name: Not on file   Number of children: 2   Years of education: Not on file   Highest education level: 12th grade  Occupational History    Employer: RETIRED  Tobacco Use   Smoking status: Never    Passive exposure: Past   Smokeless tobacco: Never  Vaping Use   Vaping status: Never Used  Substance and Sexual Activity   Alcohol use: Yes    Alcohol/week: 0.0 standard drinks of alcohol    Comment: 1 every 3 months   Drug use: No   Sexual activity: Not Currently    Comment: intercourse age 55, more than 5 sexual partners,des neg  Other Topics Concern   Not on file  Social History Narrative   Single.   2 daughters, 4 grandchildren.   Once worked for [employer].   Enjoys Catering manager.    Social Drivers of Corporate investment banker Strain: Low Risk  (01/01/2024)   Overall Financial Resource Strain (CARDIA)    Difficulty of Paying Living Expenses: Not very hard  Food Insecurity: No Food Insecurity (01/01/2024)   Hunger Vital Sign    Worried About Running Out of Food in the Last Year: Never true    Ran Out of Food in the Last Year: Never true  Transportation Needs: No Transportation Needs (01/01/2024)   PRAPARE - Administrator, Civil Service (Medical): No    Lack of Transportation (Non-Medical): No  Physical Activity: Unknown (01/01/2024)   Exercise Vital Sign    Days of Exercise per Week: 0 days    Minutes of Exercise per Session: Not on file  Stress: No Stress Concern Present (01/01/2024)   [employer] of Occupational Health - Occupational Stress Questionnaire    Feeling of Stress : Only a little  Social Connections: Socially Isolated (01/01/2024)   Social Connection and Isolation Panel    Frequency of Communication with Friends and  Family: Once a week    Frequency of Social Gatherings with Friends and Family: Once a week    Attends Religious Services: 1 to 4 times per year    Active Member of [employer] or Organizations: No    Attends Engineer, structural: Not on file    Marital Status: Divorced  Catering manager Violence: Not on file    Past Surgical History:  Procedure Laterality Date   ABDOMINAL HYSTERECTOMY  1989   TAH, BSO   CHOLECYSTECTOMY  2003   COLONOSCOPY     KNEE SURGERY  2004   right   NASAL SEPTUM SURGERY  2012   PITUITARY SURGERY  2001   POLYPECTOMY     SHOULDER SURGERY  2007   right   THYROID  LOBECTOMY  2002    Family History  Problem Relation Age of Onset   Cancer Mother        brain tumor   Heart disease Father    Parkinson's disease Sister    Breast cancer Maternal Aunt    Colon cancer Neg Hx    Colon polyps Neg Hx    Rectal cancer Neg Hx    Stomach cancer Neg Hx    Thyroid  disease Neg Hx    Esophageal cancer Neg Hx    Pancreatic cancer Neg Hx     Allergies  Allergen Reactions   Tramadol Other (See Comments)    Reaction: NIGHTMARES Other reaction(s): Not available    Current Outpatient Medications on File Prior to Visit  Medication Sig Dispense Refill   alendronate  (FOSAMAX ) 70 MG tablet Take 1 tablet (70 mg total) by mouth every 7 (seven) days. Take with a full glass of water on an empty stomach. Avoid laying flat for 2 hours. 12 tablet 1   aspirin  81 MG tablet Take 81 mg by mouth daily.     atorvastatin  (LIPITOR) 10 MG tablet Take 1 tablet (10 mg total) by mouth daily. for cholesterol. 90 tablet 1   Blood Glucose Monitoring Suppl (GHT BLOOD GLUCOSE MONITOR) w/Device KIT 1 EACH BY DOES NOT APPLY ROUTE DAILY. MAY SUBSTITUTE TO ANY MANUFACTURER COVERED BY PATIENT'S INSURANCE. 1 kit 0   buPROPion  ER (WELLBUTRIN  SR) 100 MG 12 hr tablet Take 1 tablet (100 mg total) by mouth 2 (two) times daily for depression 180 tablet 1   DULoxetine  (CYMBALTA ) 60 MG capsule TAKE 1  CAPSULE BY MOUTH EVERY DAY TO PREVENT HEADACHE 90 capsule 1   empagliflozin  (JARDIANCE ) 25 MG TABS tablet Take 1 tablet (25 mg total) by mouth daily before breakfast. 90 tablet 3   Lancets Misc. MISC 1 each by  Does not apply route daily. May substitute to any manufacturer covered by patient's insurance. 100 each 3   Lancets MISC 1 EACH BY DOES NOT APPLY ROUTE IN THE MORNING, AT NOON, AND AT BEDTIME. MAY SUBSTITUTE TO ANY MANUFACTURER COVERED BY PATIENT'S INSURANCE. 1 each 0   lansoprazole (EQ LANSOPRAZOLE) 15 MG capsule Take 15 mg by mouth 2 (two) times daily.     levothyroxine  (SYNTHROID ) 75 MCG tablet Take 1 tablet (75 mcg total) by mouth in the morning on an empty stomach with water only. No food/other medications for 1/2 hour 90 tablet 1   magnesium  30 MG tablet Take 30 mg by mouth daily.     methocarbamol (ROBAXIN) 500 MG tablet Take 500 mg by mouth every 6 (six) hours as needed for muscle spasms.     metoprolol  succinate (TOPROL -XL) 50 MG 24 hr tablet Take 1 tablet (50 mg total) by mouth daily. Take with or immediately following a meal. 90 tablet 3   metoprolol  tartrate (LOPRESSOR ) 25 MG tablet Take one (1) tablet by mouth ( 25 mg) Heart Rate greater than 100 or palpitations. (Patient taking differently: Take one (1) tablet by mouth ( 25 mg) Heart Rate greater than 100 or palpitations. As needed.) 30 tablet 6   mirtazapine  (REMERON ) 30 MG tablet TAKE 1 TABLET (30 MG TOTAL) BY MOUTH AT BEDTIME FOR SLEEP. 90 tablet 1   Multiple Minerals-Vitamins (CALCIUM -MAGNESIUM -ZINC-D3 PO) Take 1 capsule by mouth daily.     nitroGLYCERIN  (NITROSTAT ) 0.4 MG SL tablet Dissolve 1 tablet under the tongue every 5 minutes as needed for chest pain. Max of 3 doses, then 911. 25 tablet 6   nystatin  cream (MYCOSTATIN ) APPLY TO AFFECTED AREA TWICE A DAY AS NEEDED FOR DRY SKIN 30 g 0   ONETOUCH VERIO test strip 1 EACH BY IN VITRO ROUTE DAILY. MAY SUBSTITUTE TO ANY MANUFACTURER COVERED BY PATIENT'S INSURANCE. 100 strip 3    sacubitril -valsartan  (ENTRESTO ) 97-103 MG Take 1 tablet by mouth 2 (two) times daily. 180 tablet 3   Semaglutide ,0.25 or 0.5MG /DOS, (OZEMPIC , 0.25 OR 0.5 MG/DOSE,) 2 MG/3ML SOPN Inject 0.25 mg into the skin once weekly for 4 weeks, then increase to 0.5 mg once weekly thereafter for diabetes. 2 mL 0   spironolactone  (ALDACTONE ) 25 MG tablet TAKE 1 TABLET BY MOUTH EVERY DAY 90 tablet 3   torsemide  (DEMADEX ) 20 MG tablet Take 20 mg by mouth daily.     No current facility-administered medications on file prior to visit.    BP 118/68   Pulse (!) 107   Temp 97.6 F (36.4 C) (Temporal)   Ht 5' 5 (1.651 m)   Wt 254 lb (115.2 kg)   SpO2 96%   BMI 42.27 kg/m  Objective:   Physical Exam Constitutional:      General: She is not in acute distress. Cardiovascular:     Rate and Rhythm: Normal rate.  Pulmonary:     Effort: Pulmonary effort is normal.  Skin:    General: Skin is warm and dry.     Findings: Erythema present.     Comments: Moderate erythema with warmth, swelling and tenderness to left jaw line. No open wound.   Neurological:     Mental Status: She is alert.              Assessment & Plan:  Facial cellulitis Assessment & Plan: Exam today representative.  Increase cephalexin  to 500 mg 4 times daily for total of 7 days. New prescription provided.  She  will update via MyChart in 3 days.  Orders: -     Cephalexin ; Take 1 capsule (500 mg total) by mouth 4 (four) times daily.  Dispense: 20 capsule; Refill: 0        Comer MARLA Gaskins, NP

## 2024-07-14 ENCOUNTER — Telehealth (HOSPITAL_COMMUNITY): Payer: Self-pay

## 2024-07-14 NOTE — Telephone Encounter (Signed)
 Patient called stating she wants to reschedule her pulmonary rehab orientation due to increased shortness of breath today. Rescheduled to orient on 8/06 @ 1:00pm. New grad date 10/30.

## 2024-07-15 ENCOUNTER — Ambulatory Visit (HOSPITAL_COMMUNITY)

## 2024-07-19 ENCOUNTER — Other Ambulatory Visit: Payer: Self-pay | Admitting: Primary Care

## 2024-07-19 DIAGNOSIS — E1165 Type 2 diabetes mellitus with hyperglycemia: Secondary | ICD-10-CM

## 2024-07-21 ENCOUNTER — Encounter (HOSPITAL_COMMUNITY)

## 2024-07-21 ENCOUNTER — Telehealth (HOSPITAL_COMMUNITY): Payer: Self-pay

## 2024-07-21 NOTE — Telephone Encounter (Signed)
 Called to confirm appt. Pt confirmed appt. Instructed pt on proper footwear. Gave directions along with department number.

## 2024-07-22 ENCOUNTER — Encounter (HOSPITAL_COMMUNITY): Payer: Self-pay

## 2024-07-22 ENCOUNTER — Encounter (HOSPITAL_COMMUNITY)
Admission: RE | Admit: 2024-07-22 | Discharge: 2024-07-22 | Disposition: A | Discharge status: Home / Self Care | Source: Ambulatory Visit | Attending: Cardiology | Admitting: Cardiology

## 2024-07-22 ENCOUNTER — Encounter (HOSPITAL_BASED_OUTPATIENT_CLINIC_OR_DEPARTMENT_OTHER): Payer: Self-pay

## 2024-07-22 VITALS — BP 106/64 | HR 87 | Ht 64.5 in | Wt 254.4 lb

## 2024-07-22 DIAGNOSIS — I5032 Chronic diastolic (congestive) heart failure: Secondary | ICD-10-CM | POA: Insufficient documentation

## 2024-07-22 NOTE — Progress Notes (Signed)
 Pulmonary Individual Treatment Plan  Patient Details  Name: Denise [name] MRN: [id number] Date of Birth: 13-Nov-1951 Referring Provider:   Conrad Ports Pulmonary Rehab Walk Test from 07/22/2024 in [facility]  Referring Provider Dr. Shelda Bruckner    Initial Encounter Date:  Flowsheet Row Pulmonary Rehab Walk Test from 07/22/2024 in [facility]  Date 07/22/24    Visit Diagnosis: Heart failure, diastolic, chronic (HCC)  Patient's Home Medications on Admission:   Current Outpatient Medications:    alendronate  (FOSAMAX ) 70 MG tablet, Take 1 tablet (70 mg total) by mouth every 7 (seven) days. Take with a full glass of water on an empty stomach. Avoid laying flat for 2 hours., Disp: 12 tablet, Rfl: 1   aspirin  81 MG tablet, Take 81 mg by mouth daily., Disp: , Rfl:    atorvastatin  (LIPITOR) 10 MG tablet, Take 1 tablet (10 mg total) by mouth daily. for cholesterol., Disp: 90 tablet, Rfl: 1   Blood Glucose Monitoring Suppl (GHT BLOOD GLUCOSE MONITOR) w/Device KIT, 1 EACH BY DOES NOT APPLY ROUTE DAILY. MAY SUBSTITUTE TO ANY MANUFACTURER COVERED BY PATIENT'S INSURANCE., Disp: 1 kit, Rfl: 0   buPROPion  ER (WELLBUTRIN  SR) 100 MG 12 hr tablet, Take 1 tablet (100 mg total) by mouth 2 (two) times daily for depression, Disp: 180 tablet, Rfl: 1   DULoxetine  (CYMBALTA ) 60 MG capsule, TAKE 1 CAPSULE BY MOUTH EVERY DAY TO PREVENT HEADACHE, Disp: 90 capsule, Rfl: 1   empagliflozin  (JARDIANCE ) 25 MG TABS tablet, Take 1 tablet (25 mg total) by mouth daily before breakfast., Disp: 90 tablet, Rfl: 3   Lancets Misc. MISC, 1 each by Does not apply route daily. May substitute to any manufacturer covered by patient's insurance., Disp: 100 each, Rfl: 3   Lancets MISC, 1 EACH BY DOES NOT APPLY ROUTE IN THE MORNING, AT NOON, AND AT BEDTIME. MAY SUBSTITUTE TO ANY MANUFACTURER COVERED BY PATIENT'S  INSURANCE., Disp: 1 each, Rfl: 0   lansoprazole (EQ LANSOPRAZOLE) 15 MG capsule, Take 15 mg by mouth 2 (two) times daily., Disp: , Rfl:    levothyroxine  (SYNTHROID ) 75 MCG tablet, Take 1 tablet (75 mcg total) by mouth in the morning on an empty stomach with water only. No food/other medications for 1/2 hour, Disp: 90 tablet, Rfl: 1   magnesium  30 MG tablet, Take 30 mg by mouth daily., Disp: , Rfl:    methocarbamol (ROBAXIN) 500 MG tablet, Take 500 mg by mouth every 6 (six) hours as needed for muscle spasms., Disp: , Rfl:    metoprolol  succinate (TOPROL -XL) 50 MG 24 hr tablet, Take 1 tablet (50 mg total) by mouth daily. Take with or immediately following a meal., Disp: 90 tablet, Rfl: 3   metoprolol  tartrate (LOPRESSOR ) 25 MG tablet, Take one (1) tablet by mouth ( 25 mg) Heart Rate greater than 100 or palpitations., Disp: 30 tablet, Rfl: 6   mirtazapine  (REMERON ) 30 MG tablet, TAKE 1 TABLET (30 MG TOTAL) BY MOUTH AT BEDTIME FOR SLEEP., Disp: 90 tablet, Rfl: 1   Multiple Minerals-Vitamins (CALCIUM -MAGNESIUM -ZINC-D3 PO), Take 1 capsule by mouth daily., Disp: , Rfl:    nitroGLYCERIN  (NITROSTAT ) 0.4 MG SL tablet, Dissolve 1 tablet under the tongue every 5 minutes as needed for chest pain. Max of 3 doses, then 911., Disp: 25 tablet, Rfl: 6   nystatin  cream (MYCOSTATIN ), APPLY TO AFFECTED AREA TWICE A DAY AS NEEDED FOR DRY SKIN, Disp:  30 g, Rfl: 0   ONETOUCH VERIO test strip, 1 EACH BY IN VITRO ROUTE DAILY. MAY SUBSTITUTE TO ANY MANUFACTURER COVERED BY PATIENT'S INSURANCE., Disp: 100 strip, Rfl: 3   sacubitril -valsartan  (ENTRESTO ) 97-103 MG, Take 1 tablet by mouth 2 (two) times daily., Disp: 180 tablet, Rfl: 3   spironolactone  (ALDACTONE ) 25 MG tablet, TAKE 1 TABLET BY MOUTH EVERY DAY, Disp: 90 tablet, Rfl: 3   torsemide  (DEMADEX ) 20 MG tablet, Take 20 mg by mouth daily., Disp: , Rfl:    cephALEXin  (KEFLEX ) 500 MG capsule, Take 1 capsule (500 mg total) by mouth 4 (four) times daily. (Patient not taking:  Reported on 07/22/2024), Disp: 20 capsule, Rfl: 0   Semaglutide ,0.25 or 0.5MG /DOS, (OZEMPIC , 0.25 OR 0.5 MG/DOSE,) 2 MG/3ML SOPN, Inject 0.5 mg into the skin once a week. for diabetes. (Patient not taking: Reported on 07/22/2024), Disp: 9 mL, Rfl: 0  Past Medical History: Past Medical History:  Diagnosis Date   Acute non-recurrent sinusitis 06/12/2023   Allergy    seasonal   Arthritis    Benzodiazepine intoxication (HCC)    Breast pain, left 04/18/2020   Bursitis of hip    Left   Chest pain 02/25/2017   CHF (congestive heart failure) (HCC)    Colitis 01/26/2019   Constipation    90%of the time c/o constipation, occ has diarrhea - uses stool softener    Diverticulosis of colon without hemorrhage 01/21/2020   Endometriosis    Facial flushing 05/12/2014   Fibroid    GERD (gastroesophageal reflux disease)    Hematochezia    Hiatal hernia    HSV-1 (herpes simplex virus 1) infection    Hyperlipidemia    Hyperplastic colon polyp    Hypertension    Hypothyroidism    Ischemic colitis (HCC) 01/2019   LBBB (left bundle branch block)    Low back pain    Migraine    Osteopenia 2008   -1.2 FEMORAL NECK   Overdose of benzodiazepine 08/11/2021   Prediabetes    Stroke (HCC)    TIA   TIA (transient ischemic attack) 08/10/2013    Tobacco Use: Social History   Tobacco Use  Smoking Status Never   Passive exposure: Past  Smokeless Tobacco Never    Labs: Review Flowsheet  More data exists      Latest Ref Rng & Units 07/12/2022 09/27/2022 11/05/2023 01/01/2024 04/09/2024  Labs for ITP Cardiac and Pulmonary Rehab  Cholestrol 0 - 200 mg/dL 889  - 873  880  -  LDL (calc) 0 - 99 mg/dL 34  - 56  47  -  HDL-C >39.00 mg/dL 57.39  - 38  60.89  -  Trlycerides 0.0 - 149.0 mg/dL 836.9  - 802  833.9  -  Hemoglobin A1c 4.6 - 6.5 % - 6.4  - 6.9  6.4     Capillary Blood Glucose: Lab Results  Component Value Date   GLUCAP 116 (H) 08/11/2021     Pulmonary Assessment Scores:  Pulmonary  Assessment Scores     Row Name 07/22/24 1507         ADL UCSD   ADL Phase Entry     SOB Score total 55       CAT Score   CAT Score 33       mMRC Score   mMRC Score 4       UCSD: Self-administered rating of dyspnea associated with activities of daily living (ADLs) 6-point scale (0 = not at all to 5 =  maximal or unable to do because of breathlessness)  Scoring Scores range from 0 to 120.  Minimally important difference is 5 units  CAT: CAT can identify the health impairment of COPD patients and is better correlated with disease progression.  CAT has a scoring range of zero to 40. The CAT score is classified into four groups of low (less than 10), medium (10 - 20), high (21-30) and very high (31-40) based on the impact level of disease on health status. A CAT score over 10 suggests significant symptoms.  A worsening CAT score could be explained by an exacerbation, poor medication adherence, poor inhaler technique, or progression of COPD or comorbid conditions.  CAT MCID is 2 points  mMRC: mMRC (Modified Medical Research Council) Dyspnea Scale is used to assess the degree of baseline functional disability in patients of respiratory disease due to dyspnea. No minimal important difference is established. A decrease in score of 1 point or greater is considered a positive change.   Pulmonary Function Assessment:  Pulmonary Function Assessment - 07/22/24 1325       Breath   Bilateral Breath Sounds Decreased    Shortness of Breath Yes;Limiting activity          Exercise Target Goals: Exercise Program Goal: Individual exercise prescription set using results from initial 6 min walk test and THRR while considering  patient's activity barriers and safety.   Exercise Prescription Goal: Initial exercise prescription builds to 30-45 minutes a day of aerobic activity, 2-3 days per week.  Home exercise guidelines will be given to patient during program as part of exercise  prescription that the participant will acknowledge.  Activity Barriers & Risk Stratification:  Activity Barriers & Cardiac Risk Stratification - 07/22/24 1323       Activity Barriers & Cardiac Risk Stratification   Activity Barriers Joint Problems;Deconditioning;Muscular Weakness;Shortness of Breath;Right Knee Replacement;Balance Concerns;History of Falls;Back Problems          6 Minute Walk:  6 Minute Walk     Row Name 07/22/24 1500         6 Minute Walk   Phase Initial     Distance 918 feet     Walk Time 6 minutes     # of Rest Breaks 3  3:46-4:03, 5:04-5:16, 5:40-6     MPH 1.74     METS 1.32     RPE 14     Perceived Dyspnea  2     VO2 Peak 4.61     Symptoms Yes (comment)     Comments dyspnea, lower back pain     Resting HR 81 bpm     Resting BP 108/64     Resting Oxygen Saturation  94 %     Exercise Oxygen Saturation  during 6 min walk 93 %     Max Ex. HR 112 bpm     Max Ex. BP 132/72     2 Minute Post BP 110/66       Interval HR   1 Minute HR 89     2 Minute HR 93     3 Minute HR 100     4 Minute HR 107     5 Minute HR 112     6 Minute HR 112     2 Minute Post HR 91     Interval Heart Rate? Yes       Interval Oxygen   Interval Oxygen? Yes     Baseline Oxygen Saturation % 95 %  1 Minute Oxygen Saturation % 94 %     1 Minute Liters of Oxygen 0 L     2 Minute Oxygen Saturation % 93 %     2 Minute Liters of Oxygen 0 L     3 Minute Oxygen Saturation % 94 %     3 Minute Liters of Oxygen 0 L     4 Minute Oxygen Saturation % 94 %     4 Minute Liters of Oxygen 0 L     5 Minute Oxygen Saturation % 93 %     5 Minute Liters of Oxygen 0 L     6 Minute Oxygen Saturation % 94 %     6 Minute Liters of Oxygen 0 L     2 Minute Post Oxygen Saturation % 94 %     2 Minute Post Liters of Oxygen 0 L        Oxygen Initial Assessment:  Oxygen Initial Assessment - 07/22/24 1512       Home Oxygen   Home Oxygen Device None    Sleep Oxygen Prescription None     Home Exercise Oxygen Prescription None    Home Resting Oxygen Prescription None      Initial 6 min Walk   Oxygen Used None      Program Oxygen Prescription   Program Oxygen Prescription None      Intervention   Short Term Goals To learn and understand importance of maintaining oxygen saturations>88%;To learn and demonstrate proper pursed lip breathing techniques or other breathing techniques. ;To learn and understand importance of monitoring SPO2 with pulse oximeter and demonstrate accurate use of the pulse oximeter.;To learn and exhibit compliance with exercise, home and travel O2 prescription    Long  Term Goals Exhibits compliance with exercise, home  and travel O2 prescription;Maintenance of O2 saturations>88%;Exhibits proper breathing techniques, such as pursed lip breathing or other method taught during program session;Verbalizes importance of monitoring SPO2 with pulse oximeter and return demonstration          Oxygen Re-Evaluation:   Oxygen Discharge (Final Oxygen Re-Evaluation):   Initial Exercise Prescription:  Initial Exercise Prescription - 07/22/24 1500       Date of Initial Exercise RX and Referring Provider   Date 07/22/24    Referring Provider Dr. Shelda Bruckner    Expected Discharge Date 10/15/24      Recumbant Elliptical   Level 1    RPM 60    Watts 45    Minutes 15    METs 1.8      Track   Laps 5    Minutes 15    METs 1.62      Prescription Details   Frequency (times per week) 2    Duration Progress to 30 minutes of continuous aerobic without signs/symptoms of physical distress      Intensity   THRR 40-80% of Max Heartrate 59-118    Ratings of Perceived Exertion 11-13    Perceived Dyspnea 0-4      Progression   Progression Continue to progress workloads to maintain intensity without signs/symptoms of physical distress.      Resistance Training   Training Prescription Yes    Weight red bands    Reps 10-15          Perform  Capillary Blood Glucose checks as needed.  Exercise Prescription Changes:   Exercise Comments:   Exercise Goals and Review:   Exercise Goals     Row Name 07/22/24  1324             Exercise Goals   Increase Physical Activity Yes       Intervention Provide advice, education, support and counseling about physical activity/exercise needs.;Develop an individualized exercise prescription for aerobic and resistive training based on initial evaluation findings, risk stratification, comorbidities and participant's personal goals.       Expected Outcomes Short Term: Attend rehab on a regular basis to increase amount of physical activity.;Long Term: Exercising regularly at least 3-5 days a week.;Long Term: Add in home exercise to make exercise part of routine and to increase amount of physical activity.       Increase Strength and Stamina Yes       Intervention Provide advice, education, support and counseling about physical activity/exercise needs.;Develop an individualized exercise prescription for aerobic and resistive training based on initial evaluation findings, risk stratification, comorbidities and participant's personal goals.       Expected Outcomes Short Term: Increase workloads from initial exercise prescription for resistance, speed, and METs.;Short Term: Perform resistance training exercises routinely during rehab and add in resistance training at home;Long Term: Improve cardiorespiratory fitness, muscular endurance and strength as measured by increased METs and functional capacity ( )       Able to understand and use rate of perceived exertion (RPE) scale Yes       Intervention Provide education and explanation on how to use RPE scale       Expected Outcomes Short Term: Able to use RPE daily in rehab to express subjective intensity level;Long Term:  Able to use RPE to guide intensity level when exercising independently       Able to understand and use Dyspnea scale Yes        Intervention Provide education and explanation on how to use Dyspnea scale       Expected Outcomes Short Term: Able to use Dyspnea scale daily in rehab to express subjective sense of shortness of breath during exertion;Long Term: Able to use Dyspnea scale to guide intensity level when exercising independently       Knowledge and understanding of Target Heart Rate Range (THRR) Yes       Intervention Provide education and explanation of THRR including how the numbers were predicted and where they are located for reference       Expected Outcomes Short Term: Able to state/look up THRR;Short Term: Able to use daily as guideline for intensity in rehab;Long Term: Able to use THRR to govern intensity when exercising independently       Understanding of Exercise Prescription Yes       Intervention Provide education, explanation, and written materials on patient's individual exercise prescription       Expected Outcomes Short Term: Able to explain program exercise prescription;Long Term: Able to explain home exercise prescription to exercise independently          Exercise Goals Re-Evaluation :   Discharge Exercise Prescription (Final Exercise Prescription Changes):   Nutrition:  Target Goals: Understanding of nutrition guidelines, daily intake of sodium 1500mg , cholesterol 200mg , calories 30% from fat and 7% or less from saturated fats, daily to have 5 or more servings of fruits and vegetables.  Biometrics:  Pre Biometrics - 07/22/24 1511       Pre Biometrics   Grip Strength 12 kg           Nutrition Therapy Plan and Nutrition Goals:   Nutrition Assessments:  MEDIFICTS Score Key: >=70 Need to make dietary changes  40-70 Heart Healthy Diet <= 40 Therapeutic Level Cholesterol Diet   Picture Your Plate Scores: <59 Unhealthy dietary pattern with much room for improvement. 41-50 Dietary pattern unlikely to meet recommendations for good health and room for improvement. 51-60 More  healthful dietary pattern, with some room for improvement.  >60 Healthy dietary pattern, although there may be some specific behaviors that could be improved.    Nutrition Goals Re-Evaluation:   Nutrition Goals Discharge (Final Nutrition Goals Re-Evaluation):   Psychosocial: Target Goals: Acknowledge presence or absence of significant depression and/or stress, maximize coping skills, provide positive support system. Participant is able to verbalize types and ability to use techniques and skills needed for reducing stress and depression.  Initial Review & Psychosocial Screening:  Initial Psych Review & Screening - 07/22/24 1326       Initial Review   Current issues with History of Depression;Current Psychotropic Meds      Family Dynamics   Good Support System? Yes    Comments daughter is a PA in [place],  helps her with her medical questions and meds, other daughter lives close to her, stated she can depend on both      Barriers   Psychosocial barriers to participate in program Psychosocial barriers identified (see note)      Screening Interventions   Interventions Encouraged to exercise;To provide support and resources with identified psychosocial needs;Provide feedback about the scores to participant    Expected Outcomes Short Term goal: Utilizing psychosocial counselor, staff and physician to assist with identification of specific Stressors or current issues interfering with healing process. Setting desired goal for each stressor or current issue identified.;Long Term Goal: Stressors or current issues are controlled or eliminated.;Short Term goal: Identification and review with participant of any Quality of Life or Depression concerns found by scoring the questionnaire.;Long Term goal: The participant improves quality of Life and PHQ9 Scores as seen by post scores and/or verbalization of changes          Quality of Life Scores:  Scores of 19 and below usually indicate a poorer  quality of life in these areas.  A difference of  2-3 points is a clinically meaningful difference.  A difference of 2-3 points in the total score of the Quality of Life Index has been associated with significant improvement in overall quality of life, self-image, physical symptoms, and general health in studies assessing change in quality of life.  PHQ-9: Review Flowsheet  More data exists      07/22/2024 07/07/2024 04/09/2024 01/01/2024 07/31/2023  Depression screen PHQ 2/9  Decreased Interest 1 0 1 0 1  Down, Depressed, Hopeless 1 0 1 0 1  PHQ - 2 Score 2 0 2 0 2  Altered sleeping 2 - 1 - 1  Tired, decreased energy 2 - 2 - 1  Change in appetite 1 - 2 - 1  Feeling bad or failure about yourself  1 - 1 - 1  Trouble concentrating 1 - 1 - 1  Moving slowly or fidgety/restless 0 - 1 - 0  Suicidal thoughts 0 - 0 - 0  PHQ-9 Score 9 - 10 - 7  Difficult doing work/chores Not difficult at all - - - Somewhat difficult   Interpretation of Total Score  Total Score Depression Severity:  1-4 = Minimal depression, 5-9 = Mild depression, 10-14 = Moderate depression, 15-19 = Moderately severe depression, 20-27 = Severe depression   Psychosocial Evaluation and Intervention:  Psychosocial Evaluation - 07/22/24 1520  Psychosocial Evaluation & Interventions   Interventions Encouraged to exercise with the program and follow exercise prescription    Comments Denise [name] reported history of depression. She stated she'll get depressed when she has a life changing event. She mentioned her divorce and her daughter moving to [place]. She stated she knows when to get help if needed and has seen a therapist in the past. Denise [name]'s PHQ 2/9 scores were 2/9. Despite her scores she stated she feels her mental health is stable and has not only the support of her 2 daughters but also her significant other. Denise [name] declines any additional needs or resources at this time.    Expected Outcomes For Denise [name] to participate in Pulm  Rehab free of any psy/soc barriers    Continue Psychosocial Services  Follow up required by staff          Psychosocial Re-Evaluation:   Psychosocial Discharge (Final Psychosocial Re-Evaluation):   Education: Education Goals: Education classes will be provided on a weekly basis, covering required topics. Participant will state understanding/return demonstration of topics presented.  Learning Barriers/Preferences:   Education Topics: Know Your Numbers Group instruction that is supported by a PowerPoint presentation. Instructor discusses importance of knowing and understanding resting, exercise, and post-exercise oxygen saturation, heart rate, and blood pressure. Oxygen saturation, heart rate, blood pressure, rating of perceived exertion, and dyspnea are reviewed along with a normal range for these values.    Exercise for the Pulmonary Patient Group instruction that is supported by a PowerPoint presentation. Instructor discusses benefits of exercise, core components of exercise, frequency, duration, and intensity of an exercise routine, importance of utilizing pulse oximetry during exercise, safety while exercising, and options of places to exercise outside of rehab.    MET Level  Group instruction provided by PowerPoint, verbal discussion, and written material to support subject matter. Instructor reviews what METs are and how to increase METs.    Pulmonary Medications Verbally interactive group education provided by instructor with focus on inhaled medications and proper administration.   Anatomy and Physiology of the Respiratory System Group instruction provided by PowerPoint, verbal discussion, and written material to support subject matter. Instructor reviews respiratory cycle and anatomical components of the respiratory system and their functions. Instructor also reviews differences in obstructive and restrictive respiratory diseases with examples of each.    Oxygen  Safety Group instruction provided by PowerPoint, verbal discussion, and written material to support subject matter. There is an overview of "What is Oxygen" and "Why do we need it".  Instructor also reviews how to create a safe environment for oxygen use, the importance of using oxygen as prescribed, and the risks of noncompliance. There is a brief discussion on traveling with oxygen and resources the patient may utilize.   Oxygen Use Group instruction provided by PowerPoint, verbal discussion, and written material to discuss how supplemental oxygen is prescribed and different types of oxygen supply systems. Resources for more information are provided.    Breathing Techniques Group instruction that is supported by demonstration and informational handouts. Instructor discusses the benefits of pursed lip and diaphragmatic breathing and detailed demonstration on how to perform both.     Risk Factor Reduction Group instruction that is supported by a PowerPoint presentation. Instructor discusses the definition of a risk factor, different risk factors for pulmonary disease, and how the heart and lungs work together.   Pulmonary Diseases Group instruction provided by PowerPoint, verbal discussion, and written material to support subject matter. Instructor gives an overview of the different type  of pulmonary diseases. There is also a discussion on risk factors and symptoms as well as ways to manage the diseases.   Stress and Energy Conservation Group instruction provided by PowerPoint, verbal discussion, and written material to support subject matter. Instructor gives an overview of stress and the impact it can have on the body. Instructor also reviews ways to reduce stress. There is also a discussion on energy conservation and ways to conserve energy throughout the day.   Warning Signs and Symptoms Group instruction provided by PowerPoint, verbal discussion, and written material to support subject  matter. Instructor reviews warning signs and symptoms of stroke, heart attack, cold and flu. Instructor also reviews ways to prevent the spread of infection.   Other Education Group or individual verbal, written, or video instructions that support the educational goals of the pulmonary rehab program.    Knowledge Questionnaire Score:  Knowledge Questionnaire Score - 07/22/24 1507       Knowledge Questionnaire Score   Pre Score 16/18          Core Components/Risk Factors/Patient Goals at Admission:  Personal Goals and Risk Factors at Admission - 07/22/24 1347       Core Components/Risk Factors/Patient Goals on Admission    Weight Management Yes;Obesity;Weight Loss    Intervention Weight Management: Develop a combined nutrition and exercise program designed to reach desired caloric intake, while maintaining appropriate intake of nutrient and fiber, sodium and fats, and appropriate energy expenditure required for the weight goal.;Weight Management: Provide education and appropriate resources to help participant work on and attain dietary goals.;Weight Management/Obesity: Establish reasonable short term and long term weight goals.;Obesity: Provide education and appropriate resources to help participant work on and attain dietary goals.    Admit Weight 254 lb 6.6 oz (115.4 kg)    Goal Weight: Short Term 240 lb (108.9 kg)    Goal Weight: Long Term 200 lb (90.7 kg)    Expected Outcomes Short Term: Continue to assess and modify interventions until short term weight is achieved;Long Term: Adherence to nutrition and physical activity/exercise program aimed toward attainment of established weight goal;Weight Loss: Understanding of general recommendations for a balanced deficit meal plan, which promotes 1-2 lb weight loss per week and includes a negative energy balance of [phone] kcal/d;Understanding recommendations for meals to include 15-35% energy as protein, 25-35% energy from fat, 35-60% energy  from carbohydrates, less than 200mg  of dietary cholesterol, 20-35 gm of total fiber daily;Understanding of distribution of calorie intake throughout the day with the consumption of 4-5 meals/snacks    Improve shortness of breath with ADL's Yes    Intervention Provide education, individualized exercise plan and daily activity instruction to help decrease symptoms of SOB with activities of daily living.    Expected Outcomes Short Term: Improve cardiorespiratory fitness to achieve a reduction of symptoms when performing ADLs;Long Term: Be able to perform more ADLs without symptoms or delay the onset of symptoms          Core Components/Risk Factors/Patient Goals Review:    Core Components/Risk Factors/Patient Goals at Discharge (Final Review):    ITP Comments:   Comments: Dr. Slater Staff is Medical Director for Pulmonary Rehab at [facility].

## 2024-07-22 NOTE — Progress Notes (Signed)
 Denise [name] 73 y.o. female  Pulmonary Rehab Orientation Note  This patient who was referred to Pulmonary Rehab by Dr. Shelda Bruckner with the diagnosis of Diastolic Heart Failure arrived today in Cardiac and Pulmonary Rehab. She  arrived ambulatory with altered gait due to a recent fall. She  does not carry portable oxygen. Per patient, Denise [name] uses oxygen never. Color good, skin warm and dry. Patient is oriented to time and place. Patient's medical history, psychosocial health, and medications reviewed.   Psychosocial assessment reveals patient lives with alone. Denise [name] is currently retired. Patient hobbies include watching tv and spending time with others. Patient reports her stress level is low. Areas of stress/anxiety include health. Patient does not exhibit signs of possible depression. Signs of depression include helplessness and fatigue. PHQ2/9 score 2/9. Denise [name] reported history of depression. She stated she'll get depressed when she has a life changing event. She mentioned her divorce and her daughter moving to [place]. She stated she knows when to get help if needed and has seen a therapist in the past. Despite her scores she stated she feels her mental health is stable and has not only the support of her 2 daughters but also her significant other. Denise [name] declines any additional needs or resources at this time.  Denise [name] shows good  coping skills with positive outlook on life. Offered emotional support and reassurance. Will continue to monitor and evaluate progress toward psychosocial goal(s) of decreased health related stress.   Physical Assessment  Well appearing, obese, A&Ox4, NAD Eyes/Ears: WNL Lungs: Diminished with no wheezes, rales, rhonchi, denies chronic cough, + dyspnea on exertion Heart: Regular rate rhythm, no murmurs, no rubs, no clicks Gastrointestinal: abdomin soft, + bowel sounds in all 4 quads, denies recent weight gain or loss, endorses normal  BMs Genitourinary: WNL, pt denies s/s Extremities:  +2 pulses, grip strength equal, strong, no edema, no cyanosis, no clubbing Integumentary: pt denies any rashes, open or non healing wounds Psy/Soc: Pt denies Assistive devices: Pt occasionally uses cane for balance   Denise [name] reports she does take medications as prescribed. Patient states she follows a regular  diet. The patient has been trying to lose weight through a healthy diet and exercise program. Pt's weight will be monitored closely.   Demonstration and practice of PLB using pulse oximeter. Denise [name] able to return demonstration satisfactorily. Safety and hand hygiene in the exercise area reviewed with patient. Denise [name] voices understanding of the information reviewed. Department expectations discussed with patient and achievable goals were set. The patient shows enthusiasm about attending the program and we look forward to working with Denise. Denise [name] completed a 6 min walk test today and is scheduled to begin exercise on 07/28/24 at 1315.   [id number] Denise [name]

## 2024-07-23 ENCOUNTER — Encounter (HOSPITAL_COMMUNITY)

## 2024-07-23 ENCOUNTER — Ambulatory Visit (HOSPITAL_BASED_OUTPATIENT_CLINIC_OR_DEPARTMENT_OTHER): Admitting: Nurse Practitioner

## 2024-07-23 ENCOUNTER — Encounter (HOSPITAL_BASED_OUTPATIENT_CLINIC_OR_DEPARTMENT_OTHER): Payer: Self-pay | Admitting: Nurse Practitioner

## 2024-07-23 VITALS — BP 116/78 | HR 100 | Ht 64.5 in | Wt 256.1 lb

## 2024-07-23 DIAGNOSIS — E66813 Obesity, class 3: Secondary | ICD-10-CM

## 2024-07-23 DIAGNOSIS — Z6841 Body Mass Index (BMI) 40.0 and over, adult: Secondary | ICD-10-CM | POA: Diagnosis not present

## 2024-07-23 DIAGNOSIS — I5032 Chronic diastolic (congestive) heart failure: Secondary | ICD-10-CM

## 2024-07-23 DIAGNOSIS — I10 Essential (primary) hypertension: Secondary | ICD-10-CM | POA: Diagnosis not present

## 2024-07-23 DIAGNOSIS — E785 Hyperlipidemia, unspecified: Secondary | ICD-10-CM | POA: Diagnosis not present

## 2024-07-23 DIAGNOSIS — R42 Dizziness and giddiness: Secondary | ICD-10-CM | POA: Diagnosis not present

## 2024-07-23 DIAGNOSIS — R2689 Other abnormalities of gait and mobility: Secondary | ICD-10-CM

## 2024-07-23 DIAGNOSIS — E118 Type 2 diabetes mellitus with unspecified complications: Secondary | ICD-10-CM

## 2024-07-23 DIAGNOSIS — R0609 Other forms of dyspnea: Secondary | ICD-10-CM | POA: Diagnosis not present

## 2024-07-23 DIAGNOSIS — I447 Left bundle-branch block, unspecified: Secondary | ICD-10-CM | POA: Diagnosis not present

## 2024-07-23 NOTE — Patient Instructions (Signed)
 Medication Instructions:   You can try Magnesium  gluconate or Magnesium  LThrenate for Leg cramps.    *If you need a refill on your cardiac medications before your next appointment, please call your pharmacy*  Lab Work:  None ordered.  If you have labs (blood work) drawn today and your tests are completely normal, you will receive your results only by: MyChart Message (if you have MyChart) OR A paper copy in the mail If you have any lab test that is abnormal or we need to change your treatment, we will call you to review the results.  Testing/Procedures:  None ordered.  Follow-Up: At [facility], you and your health needs are our priority.  As part of our continuing mission to provide you with exceptional heart care, our providers are all part of one team.  This team includes your primary Cardiologist (physician) and Advanced Practice Providers or APPs (Physician Assistants and Nurse Practitioners) who all work together to provide you with the care you need, when you need it.  Your next appointment:   6 month(s)  Provider:   Shelda Bruckner, MD    We recommend signing up for the patient portal called MyChart.  Sign up information is provided on this After Visit Summary.  MyChart is used to connect with patients for Virtual Visits (Telemedicine).  Patients are able to view lab/test results, encounter notes, upcoming appointments, etc.  Non-urgent messages can be sent to your provider as well.   To learn more about what you can do with MyChart, go to [email or web address].   Other Instructions  Your physician wants you to follow-up in: 6 months.  You will receive a reminder letter in the mail two months in advance. If you don't receive a letter, please call our office to schedule the follow-up appointment.      You have been referred to Medical Nutrition Therapy.  The office will call you to schedule your appointment.

## 2024-07-23 NOTE — Progress Notes (Unsigned)
 Cardiology Office Note:  .   Date:  07/24/2024  ID:  Denise [name], DOB 1951-09-25, MRN [id number] PCP: Gretta Comer POUR, NP  [facility] Providers Cardiologist:  Shelda Bruckner, MD    Patient Profile: .      PMH Left bundle branch block Coronary calcium  score of 0 Chronic HFpEF Obesity Iron deficiency anemia Hypertension Family history CAD Father - MI mid 32s Grandmother - stroke GERD TIA  Referred to cardiology and seen by Dr. Alveta for evaluation of shortness of breath on 07/16/2022. Had recent TTE 07/09/22 that revealed normal LVEF 55-60%, no rwma, indeterminate diastolic parameters, and only trivial.  She reported leg edema and running out of breath while walking and dressing, as well as two-pillow orthopnea. Symptoms felt to be secondary to HFpEF and she was started on GDMT over the course of several office visits including spironolactone , Jardiance , Entresto , and metoprolol .  She did not feel much improvement on Lasix  and was subsequently switched to torsemide . She had difficulty affording Entresto  and during times of financial burden, she was on valsartan .  She underwent coronary CTA 08/28/2022 which revealed no evidence of CAD and coronary calcium  score of 0.  7-day ZIO monitor revealed predominant rhythm sinus rhythm, 1 run of SVT lasting 8 beats.  Symptoms persisted despite good medical therapy and she underwent cardiac MR on 01/30/2023 which revealed normal LV size and function with LVEF 56%, no myocardial LGE or scar, no evidence for myocardial inflammation or infiltration, no evidence for myocarditis, normal RV size and function.  She reported significant weight gain since she had retired for a total of 100 lbs over the course of a couple of years.  She was found to be anemic and was started on iron.  She continued to feel fatigued but orthopnea improved.  Last cardiology clinic visit was 02/11/2023 with Dr. Alveta.  She reported difficulty exercising due to  back issues.  She did not appear volume overloaded and no medication changes were made.  She was advised to return in 1 year for follow-up.  She contacted our office 09/24/2023 to report shortness of breath that had worsened over the previous 2 months.  She was also feeling abdominal distention and nausea she had stopped Jardiance  and Entresto  due to cost but was planning to get Entresto  filled for 1 month. Our nursing team could not reach her to triage her symptoms.   Seen in clinic by me on 11/05/23. She was considering having colonoscopy but decided to postpone. She continued to note fluid retention in her abdomen. Occasional leg and arm swelling. Has difficulty with stairs due to breathlessness and hip pain. No orthopnea or PND. Gets out of breath with walking at times. Has had financial difficulty getting Entresto  and Jardiance  and has stopped them intermittently. We enrolled her in the [employer] grant which will cover the cost of Jardiance  and Entresto . Reports fluctuations in weight, which she monitors but not daily.  Admits she eats out frequently which she acknowledges may be high in sodium. She expresses a desire to improve her diet and is open to participating in an exercise program. No presyncope or syncope. We referred her to cardiac rehab and planned to check BNP, however there is no record that it was completed. Furosemide  was resumed because she felt a better response when taking it over torsemide .   She returned to clinic 01/02/24 and was seen by Artist Pouch, PA for persistent DOE. She described feeling like I swallowed a water balloon. Reported  HR up to 130s at times. She had resumed torsemide . BNP mildly elevated at 457, Scr 1.57, K+ 5.1. She was advised to hold torsemide  and spironolactone  for 2 days and d/c potassium supplement. Follow-up BMET on 01/07/24 revealed K+ 4.6 and Scr 0.94. She was advised to resume spironolactone  and torsemide .   Seen by me on 02/04/24 reporting rapid heart  rate during activities such as washing dishes and taking a shower, with elevations up to 140 bpm. Feeling short of breath when HR is elevated. Generally takes about 10-15 minutes for the heart rate to return to normal. This has been limiting her physical activities. She also reports feeling weak and dizzy at times, which she suspects could be related to her medications. At times, she feels fluid overloaded, particularly with abdominal distention. She rarely has edema in her extremities. Weight fluctuates, with the highest recorded weight being 258 lbs at home. Feels that base weight is in the upper 240s. She has started Mounjaro  for weight loss. Reports that since resuming Jardiance  and Entresto  on a regular basis in January, she has overall felt an improvement in her fluid status and general health. Previously, cost was an issue but we were able to get her the [employer] grant which is covering the cost of these medications. Working on weight loss through dietary changes. She has cut out sweets, chips, and sodas from her diet and has been consuming more fruits and vegetables. No chest pain, orthopnea, PND, presyncope, or syncope. We completed a walk test around the office and she became short of breath walking about 500 feet. No evidence of CAD on coronary CTA 08/2022. I suspect DOE may be at least somewhat secondary to deconditioning. Will refer her to pulmonary rehab.   Echo 02/10/24 revealed nearly normal LVEF 50 to 55%, septal lateral dyssynchrony C consistent with LBBB, G1 DD, mildly reduced RV SF, no significant valve disease.  Carotid duplex 02/14/2024 revealed no carotid stenosis R ICA, only mild stenosis in LICA (1-39%).   On 06/14/2024 she reported continued shortness of breath and HR elevations 100-130 BPM.  She wanted to try increased dosing of torsemide .  Advised her to take an additional 20 mg of torsemide  for 3 days and note improvement.       History of Present Illness: .    Discussed the use  of AI scribe software for clinical note transcription with the patient, who gave verbal consent to proceed.  History of Present Illness Denise [name] is a very pleasant 73 year old female who is here today for follow-up of chronic HFpEF. She continues to experience shortness of breath and fluid retention, worse if she does not take torsemide  daily.  She notices distention in her abdomen leading to breathlessness during activities like grocery shopping. Over the past one to two weeks, she has experienced dizziness characterized by losing balance while walking, without vertigo. She also notes aching legs and is concerned about her potassium levels, as she has not been consistently taking her potassium supplement due to previously high levels. She has swelling in her feet and hands, particularly after prolonged standing. Her feet were swollen today, and she injured her toe two days ago, causing pain and discomfort with shoes.Jardiance  was recently increased from 10 mg to 25 mg for blood sugar control. She has been limiting eating out because she is concerned about the sodium and how it contributes to weight gain. She denies chest pain, orthopnea, PND, syncope.   Discussed the use of AI scribe  software for clinical note transcription with the patient, who gave verbal consent to proceed.   ROS: See HPI       Studies Reviewed: .        Risk Assessment/Calculations:             Physical Exam:   VS:  BP 116/78   Pulse 100   Ht 5' 4.5 (1.638 m)   Wt 256 lb 1.6 oz (116.2 kg)   SpO2 97%   BMI 43.28 kg/m    Wt Readings from Last 3 Encounters:  07/23/24 256 lb 1.6 oz (116.2 kg)  07/22/24 254 lb 6.6 oz (115.4 kg)  07/07/24 254 lb (115.2 kg)    GEN: Obese, well developed in no acute distress NECK: No JVD; No carotid bruits CARDIAC: RRR, no murmurs, rubs, gallops RESPIRATORY:  Clear to auscultation without rales, wheezing or rhonchi  ABDOMEN: Soft, non-tender, mild distention EXTREMITIES:   No edema; No deformity     ASSESSMENT AND PLAN: .    Chronic HFpEF/DOE: EF 53%, G1DD on echo 02/10/24 Reports abdominal swelling and shortness of breath with mild exertion. She feels improvement in symptoms with torsemide  20 mg daily. Feels weight is overall stable. No PND, orthopnea, or chest pain. Diet is better, now limiting sodium and avoiding processed foods. Limits fluids to 2 L daily. Will get BMP today to assess renal function. Agreement with plan to continue torsemide  20 mg daily. Continue GDMT with metoprolol , Entresto , empagliflozin , torsemide , and spironolactone .  Hypertension: BP is well controlled. No concerns with abnormal readings. BMET today to ensure stable renal function. Continue current anti-hypertensive therapy including metoprolol , Entresto , spironolactone .  Left BBB: Known. EF remains stable on TTE 02/10/24. Cardiac MR 01/30/2023 revealed normal LVEF 56%, normal RV size and function, no scarring or evidence of myocardial infiltration or inflammation. Continue management of HFpEF and report concerning symptoms that develop.   Dizziness/Balance issues: Occasional dizziness with activity. She has leg cramps and feels her legs give out on occasion. She is starting pulmonary rehab. Encourage her to continue efforts for strength training with legs. Monitor BP and report episodes of hypotension or orthostasis.  Magnesium  supplement recommended for leg cramps.   Dyslipidemia: Lipid panel completed 01/01/2024 with total cholesterol 119, HDL 39, LDL 47, and triglycerides 833.  Well-controlled.  Continue atorvastatin .  Obesity/Type 2 diabetes: Did not tolerate Mounjaro . She is going to try semaglutide  for weight loss. She is starting pulmonary rehab. Is motivated to lose weight and learn exercises that do not exacerbate back and knee problems. Continue heart healthy diet avoiding high sodium, processed, and fatty foods. Aim to increase physical activity as tolerated. We have referred her to  medical nutrition management.      Disposition: 6 months with Dr. Lonni (transfer from Dr. Alveta) or me  Signed, Rosaline Bane, NP-C

## 2024-07-24 ENCOUNTER — Ambulatory Visit: Payer: Self-pay | Admitting: Nurse Practitioner

## 2024-07-24 LAB — BASIC METABOLIC PANEL WITH GFR
BUN/Creatinine Ratio: 17 (ref 12–28)
BUN: 20 mg/dL (ref 8–27)
CO2: 19 mmol/L — ABNORMAL LOW (ref 20–29)
Calcium: 9.7 mg/dL (ref 8.7–10.3)
Chloride: 104 mmol/L (ref 96–106)
Creatinine, Ser: 1.2 mg/dL — ABNORMAL HIGH (ref 0.57–1.00)
Glucose: 113 mg/dL — ABNORMAL HIGH (ref 70–99)
Potassium: 4.7 mmol/L (ref 3.5–5.2)
Sodium: 142 mmol/L (ref 134–144)
eGFR: 48 mL/min/1.73 — ABNORMAL LOW (ref >59)

## 2024-07-28 ENCOUNTER — Encounter (HOSPITAL_COMMUNITY)
Admission: RE | Admit: 2024-07-28 | Discharge: 2024-07-28 | Disposition: A | Discharge status: Home / Self Care | Source: Ambulatory Visit | Attending: Pulmonary Disease | Admitting: Pulmonary Disease

## 2024-07-28 DIAGNOSIS — I5032 Chronic diastolic (congestive) heart failure: Secondary | ICD-10-CM

## 2024-07-28 NOTE — Progress Notes (Signed)
 Daily Session Note  Patient Details  Name: Denise [name] MRN: [id number] Date of Birth: [birth year] Referring Provider:   Conrad Ports Pulmonary Rehab Walk Test from 07/22/2024 in [facility]  Referring Provider Dr. Shelda Bruckner    Encounter Date: 07/28/2024  Check In:  Session Check In - 07/28/24 1458       Check-In   Supervising physician immediately available to respond to emergencies [facility] MD immediately available    Physician(s) Rosabel Mose, NP    Location [facility]    Staff Present Saturnino Koyanagi, RN, Maud Moats, MS, ACSM-CEP, Exercise Physiologist;Annedrea Violetta, RN, MHA;Olinty Valere, MS, ACSM-CEP, Exercise Physiologist;Kitrina Maurin Claudene, RT    Virtual Visit No    Medication changes reported     No    Fall or balance concerns reported    No    Tobacco Cessation No Change    Warm-up and Cool-down Performed as group-led instruction    Resistance Training Performed Yes    VAD Patient? No    PAD/SET Patient? No      Pain Assessment   Currently in Pain? No/denies    Pain Score 0-No pain    Pain Onset --    Multiple Pain Sites No          Capillary Blood Glucose: No results found for this or any previous visit (from the past 24 hours).    Social History   Tobacco Use  Smoking Status Never   Passive exposure: Past  Smokeless Tobacco Never    Goals Met:  Proper associated with RPD/PD & O2 Sat Independence with exercise equipment Exercise tolerated well No report of concerns or symptoms today Strength training completed today  Goals Unmet:  Not Applicable  Comments: Service time is from 1309 to 1429.    Dr. Slater Staff is Medical Director for Pulmonary Rehab at [facility].

## 2024-07-29 LAB — GLUCOSE, CAPILLARY
Glucose-Capillary: 153 mg/dL — ABNORMAL HIGH (ref 70–99)
Glucose-Capillary: 121 mg/dL — ABNORMAL HIGH (ref 70–99)

## 2024-07-30 ENCOUNTER — Encounter (HOSPITAL_COMMUNITY): Admission: RE | Admit: 2024-07-30 | Source: Ambulatory Visit

## 2024-07-30 ENCOUNTER — Telehealth (HOSPITAL_COMMUNITY): Payer: Self-pay

## 2024-07-31 NOTE — Telephone Encounter (Signed)
 Called Denise [name] since she was absent from [place]. LVM

## 2024-08-02 MED FILL — Metoprolol Succinate Tab ER 24HR 50 MG (Tartrate Equiv): ORAL | 90 days supply | Qty: 90 | Fill #1 | Status: AC

## 2024-08-04 ENCOUNTER — Encounter (HOSPITAL_COMMUNITY)
Admission: RE | Admit: 2024-08-04 | Discharge: 2024-08-04 | Disposition: A | Discharge status: Home / Self Care | Source: Ambulatory Visit | Attending: Pulmonary Disease | Admitting: Pulmonary Disease

## 2024-08-04 VITALS — Wt 253.1 lb

## 2024-08-04 DIAGNOSIS — I5032 Chronic diastolic (congestive) heart failure: Secondary | ICD-10-CM | POA: Diagnosis not present

## 2024-08-04 NOTE — Progress Notes (Addendum)
 Daily Session Note  Patient Details  Name: Denise [name] MRN: [id number] Date of Birth: May 06, 1951 Referring Provider:   Conrad Ports Pulmonary Rehab Walk Test from 07/22/2024 in [facility]  Referring Provider Dr. Shelda Bruckner    Encounter Date: 08/04/2024  Check In:  Session Check In - 08/04/24 1429       Check-In   Supervising physician immediately available to respond to emergencies [facility] MD immediately available    Physician(s) Lum Louis, NP    Location [facility]    Staff Present Johnnie Moats, MS, ACSM-CEP, Exercise Physiologist;Casey Claudene Adell Music, RN, BSN;Samantha Belarus, RD, Fernande Levin, RN, BSN    Virtual Visit No    Medication changes reported     No    Fall or balance concerns reported    No    Comments States has been off balance, sometimes uses can    Tobacco Cessation No Change    Warm-up and Cool-down Performed as group-led instruction    Resistance Training Performed Yes    VAD Patient? No    PAD/SET Patient? No      Pain Assessment   Currently in Pain? No/denies    Pain Score 0-No pain          Capillary Blood Glucose: No results found for this or any previous visit (from the past 24 hours).  POCT Glucose - 08/04/24 1524       POCT Blood Glucose   Pre-Exercise 91 mg/dL   4oz juice & 2Tbls peanut butter given   Post-Exercise 124 mg/dL          Exercise Prescription Changes - 08/04/24 1500       Response to Exercise   Blood Pressure (Admit) 108/64    Blood Pressure (Exercise) 122/68    Blood Pressure (Exit) 104/60    Heart Rate (Admit) 74 bpm    Heart Rate (Exercise) 103 bpm    Heart Rate (Exit) 82 bpm    Oxygen Saturation (Admit) 95 %    Oxygen Saturation (Exercise) 95 %    Oxygen Saturation (Exit) 95 %    Rating of Perceived Exertion (Exercise) 13    Perceived Dyspnea (Exercise) 1    Duration Continue with 30 min of aerobic  exercise without signs/symptoms of physical distress.    Intensity THRR unchanged      Resistance Training   Training Prescription Yes    Weight red bands    Reps 10-15    Time 10 Minutes      Interval Training   Interval Training No      Recumbant Elliptical   Level 1    Minutes 15    METs 1.6      Track   Laps 5   2103ft track   Minutes 15    METs 1.77          Social History   Tobacco Use  Smoking Status Never   Passive exposure: Past  Smokeless Tobacco Never    Goals Met:  Exercise tolerated well Queuing for purse lip breathing No report of concerns or symptoms today Strength training completed today  Goals Unmet:  Not Applicable  Comments: Service time is from 1316 to 1437    Dr. Slater Staff is Medical Director for Pulmonary Rehab at [facility].

## 2024-08-05 LAB — GLUCOSE, CAPILLARY
Glucose-Capillary: 91 mg/dL (ref 70–99)
Glucose-Capillary: 124 mg/dL — ABNORMAL HIGH (ref 70–99)

## 2024-08-06 ENCOUNTER — Encounter (HOSPITAL_COMMUNITY)
Admission: RE | Admit: 2024-08-06 | Discharge: 2024-08-06 | Disposition: A | Discharge status: Home / Self Care | Source: Ambulatory Visit | Attending: Pulmonary Disease | Admitting: Pulmonary Disease

## 2024-08-06 DIAGNOSIS — I5032 Chronic diastolic (congestive) heart failure: Secondary | ICD-10-CM | POA: Diagnosis not present

## 2024-08-06 LAB — GLUCOSE, CAPILLARY
Glucose-Capillary: 140 mg/dL — ABNORMAL HIGH (ref 70–99)
Glucose-Capillary: 131 mg/dL — ABNORMAL HIGH (ref 70–99)

## 2024-08-06 NOTE — Progress Notes (Signed)
 Daily Session Note  Patient Details  Name: Denise [name] MRN: [id number] Date of Birth: 1951/04/09 Referring Provider:   Conrad Ports Pulmonary Rehab Walk Test from 07/22/2024 in [facility] for [facility], V[facility], & Lung [facility]  Referring Provider Dr. Shelda Bruckner    Encounter Date: 08/06/2024  Check In:  Session Check In - 08/06/24 1340       Check-In   Supervising physician immediately available to respond to emergencies [facility] MD immediately available    Physician(s) Rosaline Bane, NP    Location [facility]    Staff Present Johnnie Moats, MS, ACSM-CEP, Exercise Physiologist;Casey Claudene Candia Levin, RN, BSN;Randi Reeve BS, ACSM-CEP, Exercise Physiologist    Virtual Visit No    Medication changes reported     No    Fall or balance concerns reported    No    Comments States has been off balance, sometimes uses can    Tobacco Cessation No Change    Warm-up and Cool-down Performed as group-led instruction    Resistance Training Performed Yes    VAD Patient? No    PAD/SET Patient? No      Pain Assessment   Currently in Pain? No/denies    Pain Score 0-No pain    Multiple Pain Sites No          Capillary Blood Glucose: Results for orders placed or performed during the hospital encounter of 08/06/24 (from the past 24 hours)  Glucose, capillary     Status: Abnormal   Collection Time: 08/06/24  1:18 PM  Result Value Ref Range   Glucose-Capillary 140 (H) 70 - 99 mg/dL  Glucose, capillary     Status: Abnormal   Collection Time: 08/06/24  2:53 PM  Result Value Ref Range   Glucose-Capillary 131 (H) 70 - 99 mg/dL      Social History   Tobacco Use  Smoking Status Never   Passive exposure: Past  Smokeless Tobacco Never    Goals Met:  Exercise tolerated well No report of concerns or symptoms today Strength training completed today  Goals Unmet:  Not Applicable  Comments: Service time is from 1304 to 1505     Dr. Slater Staff is Medical Director for Pulmonary Rehab at [facility].

## 2024-08-11 ENCOUNTER — Encounter (HOSPITAL_COMMUNITY): Admission: RE | Admit: 2024-08-11 | Source: Ambulatory Visit

## 2024-08-11 ENCOUNTER — Telehealth (HOSPITAL_COMMUNITY): Payer: Self-pay

## 2024-08-11 NOTE — Telephone Encounter (Signed)
 Patient c/o for 1:15 PR class, no reason given.

## 2024-08-13 ENCOUNTER — Encounter (HOSPITAL_COMMUNITY)
Admission: RE | Admit: 2024-08-13 | Discharge: 2024-08-13 | Disposition: A | Discharge status: Home / Self Care | Source: Ambulatory Visit | Attending: Pulmonary Disease | Admitting: Pulmonary Disease

## 2024-08-13 VITALS — Wt 255.7 lb

## 2024-08-13 DIAGNOSIS — I5032 Chronic diastolic (congestive) heart failure: Secondary | ICD-10-CM | POA: Diagnosis not present

## 2024-08-13 NOTE — Progress Notes (Signed)
 Daily Session Note  Patient Details  Name: Denise [name] MRN: [id number] Date of Birth: 01-03-1951 Referring Provider:   [facility] Pul[facility] Re[facility] Walk Test from 07/22/2024 in [facility]  Referring Provider Dr. Shelda Bruckner    Encounter Date: 08/13/2024  Check In:  Session Check In - 08/13/24 1437       Check-In   Supervising physician immediately available to respond to emergencies [facility] MD immediately available    Physician(s) Jackee Alberts, NP    Location [facility]    Staff Present Johnnie Moats, MS, ACSM-CEP, Exercise Physiologist;Casey Claudene Candia Levin, RN, BSN;Randi Reeve BS, ACSM-CEP, Exercise Physiologist    Virtual Visit No    Medication changes reported     No    Fall or balance concerns reported    No    Comments States has been off balance, sometimes uses can    Tobacco Cessation No Change    Warm-up and Cool-down Performed as group-led instruction    Resistance Training Performed Yes    VAD Patient? No    PAD/SET Patient? No      Pain Assessment   Currently in Pain? No/denies    Multiple Pain Sites No          Capillary Blood Glucose: No results found for this or any previous visit (from the past 24 hours).    Social History   Tobacco Use  Smoking Status Never   Passive exposure: Past  Smokeless Tobacco Never    Goals Met:  Exercise tolerated well No report of concerns or symptoms today Strength training completed today  Goals Unmet:  Not Applicable  Comments: Service time is from 1308 to 1443    Dr. Slater Staff is Medical Director for Pulmonary Rehab at [facility].

## 2024-08-14 LAB — GLUCOSE, CAPILLARY
Glucose-Capillary: 86 mg/dL (ref 70–99)
Glucose-Capillary: 107 mg/dL — ABNORMAL HIGH (ref 70–99)

## 2024-08-18 ENCOUNTER — Encounter (HOSPITAL_COMMUNITY): Admission: RE | Admit: 2024-08-18 | Source: Ambulatory Visit

## 2024-08-18 ENCOUNTER — Other Ambulatory Visit: Payer: Self-pay | Admitting: Primary Care

## 2024-08-18 DIAGNOSIS — R21 Rash and other nonspecific skin eruption: Secondary | ICD-10-CM

## 2024-08-19 NOTE — Progress Notes (Signed)
 Pulmonary Individual Treatment Plan  Patient Details  Name: Denise [name] MRN: [id number] Date of Birth: [birth year] Referring Provider:   Conrad Ports Pulmonary Rehab Walk Test from 07/22/2024 in [facility]  Referring Provider Dr. Shelda Bruckner    Initial Encounter Date:  Flowsheet Row Pulmonary Rehab Walk Test from 07/22/2024 in [facility]  Date 07/22/24    Visit Diagnosis: Heart failure, diastolic, chronic (HCC)  Patient's Home Medications on Admission:  Current Outpatient Medications:    alendronate  (FOSAMAX ) 70 MG tablet, Take 1 tablet (70 mg total) by mouth every 7 (seven) days. Take with a full glass of water on an empty stomach. Avoid laying flat for 2 hours., Disp: 12 tablet, Rfl: 1   aspirin  81 MG tablet, Take 81 mg by mouth daily., Disp: , Rfl:    atorvastatin  (LIPITOR) 10 MG tablet, Take 1 tablet (10 mg total) by mouth daily. for cholesterol., Disp: 90 tablet, Rfl: 1   Blood Glucose Monitoring Suppl (GHT BLOOD GLUCOSE MONITOR) w/Device KIT, 1 EACH BY DOES NOT APPLY ROUTE DAILY. MAY SUBSTITUTE TO ANY MANUFACTURER COVERED BY PATIENT'S INSURANCE., Disp: 1 kit, Rfl: 0   buPROPion  ER (WELLBUTRIN  SR) 100 MG 12 hr tablet, Take 1 tablet (100 mg total) by mouth 2 (two) times daily for depression, Disp: 180 tablet, Rfl: 1   DULoxetine  (CYMBALTA ) 60 MG capsule, TAKE 1 CAPSULE BY MOUTH EVERY DAY TO PREVENT HEADACHE, Disp: 90 capsule, Rfl: 1   empagliflozin  (JARDIANCE ) 25 MG TABS tablet, Take 1 tablet (25 mg total) by mouth daily before breakfast., Disp: 90 tablet, Rfl: 3   Lancets Misc. MISC, 1 each by Does not apply route daily. May substitute to any manufacturer covered by patient's insurance., Disp: 100 each, Rfl: 3   Lancets MISC, 1 EACH BY DOES NOT APPLY ROUTE IN THE MORNING, AT NOON, AND AT BEDTIME. MAY SUBSTITUTE TO ANY MANUFACTURER COVERED BY PATIENT'S  INSURANCE., Disp: 1 each, Rfl: 0   lansoprazole (EQ LANSOPRAZOLE) 15 MG capsule, Take 15 mg by mouth 2 (two) times daily., Disp: , Rfl:    levothyroxine  (SYNTHROID ) 75 MCG tablet, Take 1 tablet (75 mcg total) by mouth in the morning on an empty stomach with water only. No food/other medications for 1/2 hour, Disp: 90 tablet, Rfl: 1   magnesium  30 MG tablet, Take 30 mg by mouth daily., Disp: , Rfl:    meloxicam (MOBIC) 7.5 MG tablet, Take 7.5 mg by mouth daily., Disp: , Rfl:    methocarbamol (ROBAXIN) 500 MG tablet, Take 500 mg by mouth every 6 (six) hours as needed for muscle spasms., Disp: , Rfl:    metoprolol  succinate (TOPROL -XL) 50 MG 24 hr tablet, Take 1 tablet (50 mg total) by mouth daily. Take with or immediately following a meal., Disp: 90 tablet, Rfl: 3   metoprolol  tartrate (LOPRESSOR ) 25 MG tablet, Take one (1) tablet by mouth ( 25 mg) Heart Rate greater than 100 or palpitations., Disp: 30 tablet, Rfl: 6   mirtazapine  (REMERON ) 30 MG tablet, TAKE 1 TABLET (30 MG TOTAL) BY MOUTH AT BEDTIME FOR SLEEP., Disp: 90 tablet, Rfl: 1   Multiple Minerals-Vitamins (CALCIUM -MAGNESIUM -ZINC-D3 PO), Take 1 capsule by mouth daily., Disp: , Rfl:    nitroGLYCERIN  (NITROSTAT ) 0.4 MG SL tablet, Dissolve 1 tablet under the tongue every 5 minutes as needed for chest pain. Max of 3 doses, then 911., Disp: 25 tablet, Rfl: 6  nystatin  cream (MYCOSTATIN ), APPLY TO AFFECTED AREA TWICE A DAY AS NEEDED FOR DRY SKIN, Disp: 30 g, Rfl: 0   ONETOUCH VERIO test strip, 1 EACH BY IN VITRO ROUTE DAILY. MAY SUBSTITUTE TO ANY MANUFACTURER COVERED BY PATIENT'S INSURANCE., Disp: 100 strip, Rfl: 3   sacubitril -valsartan  (ENTRESTO ) 97-103 MG, Take 1 tablet by mouth 2 (two) times daily., Disp: 180 tablet, Rfl: 3   Semaglutide ,0.25 or 0.5MG /DOS, (OZEMPIC , 0.25 OR 0.5 MG/DOSE,) 2 MG/3ML SOPN, Inject 0.5 mg into the skin once a week. for diabetes., Disp: 9 mL, Rfl: 0   spironolactone  (ALDACTONE ) 25 MG tablet, TAKE 1 TABLET BY MOUTH  EVERY DAY, Disp: 90 tablet, Rfl: 3   torsemide  (DEMADEX ) 20 MG tablet, Take 20 mg by mouth daily., Disp: , Rfl:   Past Medical History: Past Medical History:  Diagnosis Date   Acute non-recurrent sinusitis 06/12/2023   Allergy    seasonal   Arthritis    Benzodiazepine intoxication (HCC)    Breast pain, left 04/18/2020   Bursitis of hip    Left   Chest pain 02/25/2017   CHF (congestive heart failure) (HCC)    Colitis 01/26/2019   Constipation    90%of the time c/o constipation, occ has diarrhea - uses stool softener    Diverticulosis of colon without hemorrhage 01/21/2020   Endometriosis    Facial flushing 05/12/2014   Fibroid    GERD (gastroesophageal reflux disease)    Hematochezia    Hiatal hernia    HSV-1 (herpes simplex virus 1) infection    Hyperlipidemia    Hyperplastic colon polyp    Hypertension    Hypothyroidism    Ischemic colitis (HCC) 01/2019   LBBB (left bundle branch block)    Low back pain    Migraine    Osteopenia 2008   -1.2 FEMORAL NECK   Overdose of benzodiazepine 08/11/2021   Prediabetes    Stroke (HCC)    TIA   TIA (transient ischemic attack) 08/10/2013    Tobacco Use: Social History   Tobacco Use  Smoking Status Never   Passive exposure: Past  Smokeless Tobacco Never    Labs: Review Flowsheet  More data exists      Latest Ref Rng & Units 07/12/2022 09/27/2022 11/05/2023 01/01/2024 04/09/2024  Labs for ITP Cardiac and Pulmonary Rehab  Cholestrol 0 - 200 mg/dL 889  - 873  880  -  LDL (calc) 0 - 99 mg/dL 34  - 56  47  -  HDL-C >39.00 mg/dL 57.39  - 38  60.89  -  Trlycerides 0.0 - 149.0 mg/dL 836.9  - 802  833.9  -  Hemoglobin A1c 4.6 - 6.5 % - 6.4  - 6.9  6.4     POCT Glucose     Row Name 08/04/24 1524             POCT Blood Glucose   Pre-Exercise 91 mg/dL  4oz juice & 2Tbls peanut butter given       Post-Exercise 124 mg/dL          Pulmonary Assessment Scores:  Pulmonary Assessment Scores     Row Name 07/22/24 1507          ADL UCSD   ADL Phase Entry     SOB Score total 55       CAT Score   CAT Score 33       mMRC Score   mMRC Score 4        UCSD: Self-administered rating  of dyspnea associated with activities of daily living (ADLs) 6-point scale (0 = not at all to 5 = maximal or unable to do because of breathlessness)  Scoring Scores range from 0 to 120.  Minimally important difference is 5 units  CAT: CAT can identify the health impairment of COPD patients and is better correlated with disease progression.  CAT has a scoring range of zero to 40. The CAT score is classified into four groups of low (less than 10), medium (10 - 20), high (21-30) and very high (31-40) based on the impact level of disease on health status. A CAT score over 10 suggests significant symptoms.  A worsening CAT score could be explained by an exacerbation, poor medication adherence, poor inhaler technique, or progression of COPD or comorbid conditions.  CAT MCID is 2 points  mMRC: mMRC (Modified Medical Research Council) Dyspnea Scale is used to assess the degree of baseline functional disability in patients of respiratory disease due to dyspnea. No minimal important difference is established. A decrease in score of 1 point or greater is considered a positive change.   Pulmonary Function Assessment:  Pulmonary Function Assessment - 07/22/24 1325       Breath   Bilateral Breath Sounds Decreased    Shortness of Breath Yes;Limiting activity          Exercise Target Goals: Exercise Program Goal: Individual exercise prescription set using results from initial 6 min walk test and THRR while considering  patient's activity barriers and safety.   Exercise Prescription Goal: Initial exercise prescription builds to 30-45 minutes a day of aerobic activity, 2-3 days per week.  Home exercise guidelines will be given to patient during program as part of exercise prescription that the participant will  acknowledge.  Education: Aerobic Exercise: - Group verbal and visual presentation on the components of exercise prescription. Introduces F.I.T.T principle from ACSM for exercise prescriptions.  Reviews F.I.T.T. principles of aerobic exercise including progression. Written material provided at class time.   Education: Resistance Exercise: - Group verbal and visual presentation on the components of exercise prescription. Introduces F.I.T.T principle from ACSM for exercise prescriptions  Reviews F.I.T.T. principles of resistance exercise including progression. Written material provided at class time.    Education: Exercise & Equipment Safety: - Individual verbal instruction and demonstration of equipment use and safety with use of the equipment.   Education: Exercise Physiology & General Exercise Guidelines: - Group verbal and written instruction with models to review the exercise physiology of the cardiovascular system and associated critical values. Provides general exercise guidelines with specific guidelines to those with heart or lung disease.    Education: Flexibility, Balance, Mind/Body Relaxation: - Group verbal and visual presentation with interactive activity on the components of exercise prescription. Introduces F.I.T.T principle from ACSM for exercise prescriptions. Reviews F.I.T.T. principles of flexibility and balance exercise training including progression. Also discusses the mind body connection.  Reviews various relaxation techniques to help reduce and manage stress (i.e. Deep breathing, progressive muscle relaxation, and visualization). Balance handout provided to take home. Written material provided at class time.   Activity Barriers & Risk Stratification:  Activity Barriers & Cardiac Risk Stratification - 07/22/24 1323       Activity Barriers & Cardiac Risk Stratification   Activity Barriers Joint Problems;Deconditioning;Muscular Weakness;Shortness of Breath;Right Knee  Replacement;Balance Concerns;History of Falls;Back Problems          6 Minute Walk:  6 Minute Walk     Row Name 07/22/24 1500  6 Minute Walk   Phase Initial     Distance 918 feet     Walk Time 6 minutes     # of Rest Breaks 3  3:46-4:03, 5:04-5:16, 5:40-6     MPH 1.74     METS 1.32     RPE 14     Perceived Dyspnea  2     VO2 Peak 4.61     Symptoms Yes (comment)     Comments dyspnea, lower back pain     Resting HR 81 bpm     Resting BP 108/64     Resting Oxygen Saturation  94 %     Exercise Oxygen Saturation  during 6 min walk 93 %     Max Ex. HR 112 bpm     Max Ex. BP 132/72     2 Minute Post BP 110/66       Interval HR   1 Minute HR 89     2 Minute HR 93     3 Minute HR 100     4 Minute HR 107     5 Minute HR 112     6 Minute HR 112     2 Minute Post HR 91     Interval Heart Rate? Yes       Interval Oxygen   Interval Oxygen? Yes     Baseline Oxygen Saturation % 95 %     1 Minute Oxygen Saturation % 94 %     1 Minute Liters of Oxygen 0 L     2 Minute Oxygen Saturation % 93 %     2 Minute Liters of Oxygen 0 L     3 Minute Oxygen Saturation % 94 %     3 Minute Liters of Oxygen 0 L     4 Minute Oxygen Saturation % 94 %     4 Minute Liters of Oxygen 0 L     5 Minute Oxygen Saturation % 93 %     5 Minute Liters of Oxygen 0 L     6 Minute Oxygen Saturation % 94 %     6 Minute Liters of Oxygen 0 L     2 Minute Post Oxygen Saturation % 94 %     2 Minute Post Liters of Oxygen 0 L       Oxygen Initial Assessment:  Oxygen Initial Assessment - 07/22/24 1512       Home Oxygen   Home Oxygen Device None    Sleep Oxygen Prescription None    Home Exercise Oxygen Prescription None    Home Resting Oxygen Prescription None      Initial 6 min Walk   Oxygen Used None      Program Oxygen Prescription   Program Oxygen Prescription None      Intervention   Short Term Goals To learn and understand importance of maintaining oxygen saturations>88%;To learn  and demonstrate proper pursed lip breathing techniques or other breathing techniques. ;To learn and understand importance of monitoring SPO2 with pulse oximeter and demonstrate accurate use of the pulse oximeter.;To learn and exhibit compliance with exercise, home and travel O2 prescription    Long  Term Goals Exhibits compliance with exercise, home  and travel O2 prescription;Maintenance of O2 saturations>88%;Exhibits proper breathing techniques, such as pursed lip breathing or other method taught during program session;Verbalizes importance of monitoring SPO2 with pulse oximeter and return demonstration          Oxygen Re-Evaluation:  Oxygen Re-Evaluation     Row Name 08/18/24 0838             Program Oxygen Prescription   Program Oxygen Prescription None         Home Oxygen   Home Oxygen Device None       Sleep Oxygen Prescription None       Home Exercise Oxygen Prescription None       Home Resting Oxygen Prescription None         Goals/Expected Outcomes   Short Term Goals To learn and understand importance of maintaining oxygen saturations>88%;To learn and demonstrate proper pursed lip breathing techniques or other breathing techniques. ;To learn and understand importance of monitoring SPO2 with pulse oximeter and demonstrate accurate use of the pulse oximeter.;To learn and exhibit compliance with exercise, home and travel O2 prescription       Long  Term Goals Exhibits compliance with exercise, home  and travel O2 prescription;Maintenance of O2 saturations>88%;Exhibits proper breathing techniques, such as pursed lip breathing or other method taught during program session;Verbalizes importance of monitoring SPO2 with pulse oximeter and return demonstration       Goals/Expected Outcomes Compliance and understanding of oxygen saturation monitoring and breathing techniques to decrease shortness of breath.          Oxygen Discharge (Final Oxygen Re-Evaluation):  Oxygen Re-Evaluation  - 08/18/24 0838       Program Oxygen Prescription   Program Oxygen Prescription None      Home Oxygen   Home Oxygen Device None    Sleep Oxygen Prescription None    Home Exercise Oxygen Prescription None    Home Resting Oxygen Prescription None      Goals/Expected Outcomes   Short Term Goals To learn and understand importance of maintaining oxygen saturations>88%;To learn and demonstrate proper pursed lip breathing techniques or other breathing techniques. ;To learn and understand importance of monitoring SPO2 with pulse oximeter and demonstrate accurate use of the pulse oximeter.;To learn and exhibit compliance with exercise, home and travel O2 prescription    Long  Term Goals Exhibits compliance with exercise, home  and travel O2 prescription;Maintenance of O2 saturations>88%;Exhibits proper breathing techniques, such as pursed lip breathing or other method taught during program session;Verbalizes importance of monitoring SPO2 with pulse oximeter and return demonstration    Goals/Expected Outcomes Compliance and understanding of oxygen saturation monitoring and breathing techniques to decrease shortness of breath.          Initial Exercise Prescription:  Initial Exercise Prescription - 07/22/24 1500       Date of Initial Exercise RX and Referring Provider   Date 07/22/24    Referring Provider Dr. Shelda Bruckner    Expected Discharge Date 10/15/24      Recumbant Elliptical   Level 1    RPM 60    Watts 45    Minutes 15    METs 1.8      Track   Laps 5    Minutes 15    METs 1.62      Prescription Details   Frequency (times per week) 2    Duration Progress to 30 minutes of continuous aerobic without signs/symptoms of physical distress      Intensity   THRR 40-80% of Max Heartrate 59-118    Ratings of Perceived Exertion 11-13    Perceived Dyspnea 0-4      Progression   Progression Continue to progress workloads to maintain intensity without signs/symptoms of  physical distress.  Resistance Training   Training Prescription Yes    Weight red bands    Reps 10-15          Perform Capillary Blood Glucose checks as needed.  Exercise Prescription Changes:   Exercise Prescription Changes     Row Name 08/04/24 1500 08/13/24 1448           Response to Exercise   Blood Pressure (Admit) 108/64 104/70      Blood Pressure (Exercise) 122/68 --      Blood Pressure (Exit) 104/60 102/60      Heart Rate (Admit) 74 bpm 99 bpm      Heart Rate (Exercise) 103 bpm 117 bpm      Heart Rate (Exit) 82 bpm 91 bpm      Oxygen Saturation (Admit) 95 % 96 %      Oxygen Saturation (Exercise) 95 % 93 %      Oxygen Saturation (Exit) 95 % 92 %      Rating of Perceived Exertion (Exercise) 13 12      Perceived Dyspnea (Exercise) 1 2      Duration Continue with 30 min of aerobic exercise without signs/symptoms of physical distress. Continue with 30 min of aerobic exercise without signs/symptoms of physical distress.      Intensity THRR unchanged THRR unchanged        Resistance Training   Training Prescription Yes Yes      Weight red bands red bands      Reps 10-15 10-15      Time 10 Minutes 10 Minutes        Interval Training   Interval Training No --        Recumbant Elliptical   Level 1 1      Minutes 15 15      METs 1.6 2        Track   Laps 5  297ft track 5      Minutes 15 15      METs 1.77 1.77         Exercise Comments:   Exercise Goals and Review:   Exercise Goals     Row Name 07/22/24 1324             Exercise Goals   Increase Physical Activity Yes       Intervention Provide advice, education, support and counseling about physical activity/exercise needs.;Develop an individualized exercise prescription for aerobic and resistive training based on initial evaluation findings, risk stratification, comorbidities and participant's personal goals.       Expected Outcomes Short Term: Attend rehab on a regular basis to increase  amount of physical activity.;Long Term: Exercising regularly at least 3-5 days a week.;Long Term: Add in home exercise to make exercise part of routine and to increase amount of physical activity.       Increase Strength and Stamina Yes       Intervention Provide advice, education, support and counseling about physical activity/exercise needs.;Develop an individualized exercise prescription for aerobic and resistive training based on initial evaluation findings, risk stratification, comorbidities and participant's personal goals.       Expected Outcomes Short Term: Increase workloads from initial exercise prescription for resistance, speed, and METs.;Short Term: Perform resistance training exercises routinely during rehab and add in resistance training at home;Long Term: Improve cardiorespiratory fitness, muscular endurance and strength as measured by increased METs and functional capacity ( )       Able to understand and use rate of perceived exertion (  RPE) scale Yes       Intervention Provide education and explanation on how to use RPE scale       Expected Outcomes Short Term: Able to use RPE daily in rehab to express subjective intensity level;Long Term:  Able to use RPE to guide intensity level when exercising independently       Able to understand and use Dyspnea scale Yes       Intervention Provide education and explanation on how to use Dyspnea scale       Expected Outcomes Short Term: Able to use Dyspnea scale daily in rehab to express subjective sense of shortness of breath during exertion;Long Term: Able to use Dyspnea scale to guide intensity level when exercising independently       Knowledge and understanding of Target Heart Rate Range (THRR) Yes       Intervention Provide education and explanation of THRR including how the numbers were predicted and where they are located for reference       Expected Outcomes Short Term: Able to state/look up THRR;Short Term: Able to use daily as  guideline for intensity in rehab;Long Term: Able to use THRR to govern intensity when exercising independently       Understanding of Exercise Prescription Yes       Intervention Provide education, explanation, and written materials on patient's individual exercise prescription       Expected Outcomes Short Term: Able to explain program exercise prescription;Long Term: Able to explain home exercise prescription to exercise independently          Exercise Goals Re-Evaluation :  Exercise Goals Re-Evaluation     Row Name 08/18/24 0835             Exercise Goal Re-Evaluation   Exercise Goals Review Increase Physical Activity;Able to understand and use Dyspnea scale;Understanding of Exercise Prescription;Increase Strength and Stamina;Knowledge and understanding of Target Heart Rate Range (THRR);Able to understand and use rate of perceived exertion (RPE) scale       Comments Pt has completed 4 exercise sessions, missing 1. She has been walking the track for 15 min, 2.39 METs. She then has been exercising on the recumbent elliptical for 15 min, METs 2. However she is complaining of groin pain on the elliptical therefore she will move to the recumbent stepper next session, level 1. she performs warm up and cool down without significant limitations, using blue bands, 5.8 lbs. Will continue to progress as tolerated.       Expected Outcomes Through exercise at rehab and home, the patient will decrease shortness of breath and feel confident in carrying out an exercise regimen at home.          Discharge Exercise Prescription (Final Exercise Prescription Changes):  Exercise Prescription Changes - 08/13/24 1448       Response to Exercise   Blood Pressure (Admit) 104/70    Blood Pressure (Exit) 102/60    Heart Rate (Admit) 99 bpm    Heart Rate (Exercise) 117 bpm    Heart Rate (Exit) 91 bpm    Oxygen Saturation (Admit) 96 %    Oxygen Saturation (Exercise) 93 %    Oxygen Saturation (Exit) 92 %     Rating of Perceived Exertion (Exercise) 12    Perceived Dyspnea (Exercise) 2    Duration Continue with 30 min of aerobic exercise without signs/symptoms of physical distress.    Intensity THRR unchanged      Resistance Training   Training Prescription Yes  Weight red bands    Reps 10-15    Time 10 Minutes      Recumbant Elliptical   Level 1    Minutes 15    METs 2      Track   Laps 5    Minutes 15    METs 1.77          Nutrition:  Target Goals: Understanding of nutrition guidelines, daily intake of sodium 1500mg , cholesterol 200mg , calories 30% from fat and 7% or less from saturated fats, daily to have 5 or more servings of fruits and vegetables.  Education: Nutrition 1 -Group instruction provided by verbal, written material, interactive activities, discussions, models, and posters to present general guidelines for heart healthy nutrition including macronutrients, label reading, and promoting whole foods over processed counterparts. Education serves as Pensions consultant of discussion of heart healthy eating for all. Written material provided at class time.     Education: Nutrition 2 -Group instruction provided by verbal, written material, interactive activities, discussions, models, and posters to present general guidelines for heart healthy nutrition including sodium, cholesterol, and saturated fat. Providing guidance of habit forming to improve blood pressure, cholesterol, and body weight. Written material provided at class time.     Biometrics:  Pre Biometrics - 07/22/24 1511       Pre Biometrics   Grip Strength 12 kg           Nutrition Therapy Plan and Nutrition Goals:  Nutrition Therapy & Goals - 07/28/24 1603       Nutrition Therapy   Diet General Healthy Diet    Drug/Food Interactions Statins/Certain Fruits      Personal Nutrition Goals   Nutrition Goal Patient to improve diet quality by using the plate method as a guide for meal planning to include  lean protein/plant protein, fruits, vegetables, whole grains, nonfat dairy as part of a well-balanced diet.    Comments Patient has medical history of HTN, hyperlipidemia, CHF, DM2. She recently started Ozempic  in July to aid with continued blood sugar support and weight loss. LDL is well controlled. Patient will benefit from participation in pulmonary rehab for nutrition education, exercise, and lifestyle modification.      Intervention Plan   Intervention Prescribe, educate and counsel regarding individualized specific dietary modifications aiming towards targeted core components such as weight, hypertension, lipid management, diabetes, heart failure and other comorbidities.;Nutrition handout(s) given to patient.    Expected Outcomes Short Term Goal: Understand basic principles of dietary content, such as calories, fat, sodium, cholesterol and nutrients.;Long Term Goal: Adherence to prescribed nutrition plan.          Nutrition Assessments:  MEDIFICTS Score Key: >=70 Need to make dietary changes  40-70 Heart Healthy Diet <= 40 Therapeutic Level Cholesterol Diet   Picture Your Plate Scores: <59 Unhealthy dietary pattern with much room for improvement. 41-50 Dietary pattern unlikely to meet recommendations for good health and room for improvement. 51-60 More healthful dietary pattern, with some room for improvement.  >60 Healthy dietary pattern, although there may be some specific behaviors that could be improved.   Nutrition Goals Re-Evaluation:  Nutrition Goals Re-Evaluation     Row Name 07/28/24 1603             Goals   Current Weight 255 lb 4.7 oz (115.8 kg)       Comment A1c 6.4, triglycerdies 166, LDL 47       Expected Outcome Patient has medical history of HTN, hyperlipidemia, CHF, DM2. She recently  started Ozempic  in July to aid with continued blood sugar support and weight loss. LDL is well controlled. Patient will benefit from participation in pulmonary rehab for  nutrition education, exercise, and lifestyle modification.          Nutrition Goals Discharge (Final Nutrition Goals Re-Evaluation):  Nutrition Goals Re-Evaluation - 07/28/24 1603       Goals   Current Weight 255 lb 4.7 oz (115.8 kg)    Comment A1c 6.4, triglycerdies 166, LDL 47    Expected Outcome Patient has medical history of HTN, hyperlipidemia, CHF, DM2. She recently started Ozempic  in July to aid with continued blood sugar support and weight loss. LDL is well controlled. Patient will benefit from participation in pulmonary rehab for nutrition education, exercise, and lifestyle modification.          Psychosocial: Target Goals: Acknowledge presence or absence of significant depression and/or stress, maximize coping skills, provide positive support system. Participant is able to verbalize types and ability to use techniques and skills needed for reducing stress and depression.   Education: Stress, Anxiety, and Depression - Group verbal and visual presentation to define topics covered.  Reviews how body is impacted by stress, anxiety, and depression.  Also discusses healthy ways to reduce stress and to treat/manage anxiety and depression.  Written material provided at class time.   Education: Sleep Hygiene -Provides group verbal and written instruction about how sleep can affect your health.  Define sleep hygiene, discuss sleep cycles and impact of sleep habits. Review good sleep hygiene tips.    Initial Review & Psychosocial Screening:  Initial Psych Review & Screening - 07/22/24 1326       Initial Review   Current issues with History of Depression;Current Psychotropic Meds      Family Dynamics   Good Support System? Yes    Comments daughter is a PA in [place],  helps her with her medical questions and meds, other daughter lives close to her, stated she can depend on both      Barriers   Psychosocial barriers to participate in program Psychosocial barriers identified (see  note)      Screening Interventions   Interventions Encouraged to exercise;To provide support and resources with identified psychosocial needs;Provide feedback about the scores to participant    Expected Outcomes Short Term goal: Utilizing psychosocial counselor, staff and physician to assist with identification of specific Stressors or current issues interfering with healing process. Setting desired goal for each stressor or current issue identified.;Long Term Goal: Stressors or current issues are controlled or eliminated.;Short Term goal: Identification and review with participant of any Quality of Life or Depression concerns found by scoring the questionnaire.;Long Term goal: The participant improves quality of Life and PHQ9 Scores as seen by post scores and/or verbalization of changes          Quality of Life Scores:  Scores of 19 and below usually indicate a poorer quality of life in these areas.  A difference of  2-3 points is a clinically meaningful difference.  A difference of 2-3 points in the total score of the Quality of Life Index has been associated with significant improvement in overall quality of life, self-image, physical symptoms, and general health in studies assessing change in quality of life.  PHQ-9: Review Flowsheet  More data exists      07/22/2024 07/07/2024 04/09/2024 01/01/2024 07/31/2023  Depression screen PHQ 2/9  Decreased Interest 1 0 1 0 1  Down, Depressed, Hopeless 1 0 1 0 1  PHQ - 2 Score 2 0 2 0 2  Altered sleeping 2 - 1 - 1  Tired, decreased energy 2 - 2 - 1  Change in appetite 1 - 2 - 1  Feeling bad or failure about yourself  1 - 1 - 1  Trouble concentrating 1 - 1 - 1  Moving slowly or fidgety/restless 0 - 1 - 0  Suicidal thoughts 0 - 0 - 0  PHQ-9 Score 9 - 10 - 7  Difficult doing work/chores Not difficult at all - - - Somewhat difficult   Interpretation of Total Score  Total Score Depression Severity:  1-4 = Minimal depression, 5-9 = Mild depression,  10-14 = Moderate depression, 15-19 = Moderately severe depression, 20-27 = Severe depression   Psychosocial Evaluation and Intervention:  Psychosocial Evaluation - 07/22/24 1520       Psychosocial Evaluation & Interventions   Interventions Encouraged to exercise with the program and follow exercise prescription    Comments Jordyne reported history of depression. She stated she'll get depressed when she has a life changing event. She mentioned her divorce and her daughter moving to [place]. She stated she knows when to get help if needed and has seen a therapist in the past. Cecia's PHQ 2/9 scores were 2/9. Despite her scores she stated she feels her mental health is stable and has not only the support of her 2 daughters but also her significant other. Sachiko declines any additional needs or resources at this time.    Expected Outcomes For Morene to participate in [facility] free of any psy/soc barriers    Continue Psychosocial Services  Follow up required by staff          Psychosocial Re-Evaluation:  Psychosocial Re-Evaluation     Row Name 08/12/24 1639             Psychosocial Re-Evaluation   Current issues with History of Depression;Current Psychotropic Meds       Comments Shantea states her mental health is stable. She states she has great support from her daughters. She declines any additional resources, needs, or referrals.       Expected Outcomes For Chantal to continue exercising for mental health. To continue being compliant with her psychotropic meds. To use positive coping mechanisms for stress relief.       Interventions Encouraged to attend Pulmonary Rehabilitation for the exercise       Continue Psychosocial Services  Follow up required by staff          Psychosocial Discharge (Final Psychosocial Re-Evaluation):  Psychosocial Re-Evaluation - 08/12/24 1639       Psychosocial Re-Evaluation   Current issues with History of Depression;Current Psychotropic Meds     Comments Skarlette states her mental health is stable. She states she has great support from her daughters. She declines any additional resources, needs, or referrals.    Expected Outcomes For Latana to continue exercising for mental health. To continue being compliant with her psychotropic meds. To use positive coping mechanisms for stress relief.    Interventions Encouraged to attend Pulmonary Rehabilitation for the exercise    Continue Psychosocial Services  Follow up required by staff          Education: Education Goals: Education classes will be provided on a weekly basis, covering required topics. Participant will state understanding/return demonstration of topics presented.  Learning Barriers/Preferences:   General Pulmonary Education Topics:  Infection Prevention: - Provides verbal and written material to individual with discussion of infection  control including proper hand washing and proper equipment cleaning during exercise session.   Falls Prevention: - Provides verbal and written material to individual with discussion of falls prevention and safety.   Chronic Lung Disease Review: - Group verbal instruction with posters, models, PowerPoint presentations and videos,  to review new updates, new respiratory medications, new advancements in procedures and treatments. Providing information on websites and 800 numbers for continued self-education. Includes information about supplement oxygen, available portable oxygen systems, continuous and intermittent flow rates, oxygen safety, concentrators, and Medicare reimbursement for oxygen. Explanation of Pulmonary Drugs, including class, frequency, complications, importance of spacers, rinsing mouth after steroid MDI's, and proper cleaning methods for nebulizers. Review of basic lung anatomy and physiology related to function, structure, and complications of lung disease. Review of risk factors. Discussion about methods for diagnosing  sleep apnea and types of masks and machines for OSA. Includes a review of the use of types of environmental controls: home humidity, furnaces, filters, dust mite/pet prevention, HEPA vacuums. Discussion about weather changes, air quality and the benefits of nasal washing. Instruction on Warning signs, infection symptoms, calling MD promptly, preventive modes, and value of vaccinations. Review of effective airway clearance, coughing and/or vibration techniques. Emphasizing that all should Create an Action Plan. Written material provided at class time.   AED/CPR: - Group verbal and written instruction with the use of models to demonstrate the basic use of the AED with the basic ABC's of resuscitation.    Tests and Procedures:  - Group verbal and visual presentation and models provide information about basic cardiac anatomy and function. Reviews the testing methods done to diagnose heart disease and the outcomes of the test results. Describes the treatment choices: Medical Management, Angioplasty, or Coronary Bypass Surgery for treating various heart conditions including Myocardial Infarction, Angina, Valve Disease, and Cardiac Arrhythmias.  Written material provided at class time.   Medication Safety: - Group verbal and visual instruction to review commonly prescribed medications for heart and lung disease. Reviews the medication, class of the drug, and side effects. Includes the steps to properly store meds and maintain the prescription regimen.  Written material given at graduation.   Other: -Provides group and verbal instruction on various topics (see comments)   Knowledge Questionnaire Score:  Knowledge Questionnaire Score - 07/22/24 1507       Knowledge Questionnaire Score   Pre Score 16/18           Core Components/Risk Factors/Patient Goals at Admission:  Personal Goals and Risk Factors at Admission - 07/22/24 1347       Core Components/Risk Factors/Patient Goals on Admission     Weight Management Yes;Obesity;Weight Loss    Intervention Weight Management: Develop a combined nutrition and exercise program designed to reach desired caloric intake, while maintaining appropriate intake of nutrient and fiber, sodium and fats, and appropriate energy expenditure required for the weight goal.;Weight Management: Provide education and appropriate resources to help participant work on and attain dietary goals.;Weight Management/Obesity: Establish reasonable short term and long term weight goals.;Obesity: Provide education and appropriate resources to help participant work on and attain dietary goals.    Admit Weight 254 lb 6.6 oz (115.4 kg)    Goal Weight: Short Term 240 lb (108.9 kg)    Goal Weight: Long Term 200 lb (90.7 kg)    Expected Outcomes Short Term: Continue to assess and modify interventions until short term weight is achieved;Long Term: Adherence to nutrition and physical activity/exercise program aimed toward attainment of established weight goal;Weight  Loss: Understanding of general recommendations for a balanced deficit meal plan, which promotes 1-2 lb weight loss per week and includes a negative energy balance of [phone] kcal/d;Understanding recommendations for meals to include 15-35% energy as protein, 25-35% energy from fat, 35-60% energy from carbohydrates, less than 200mg  of dietary cholesterol, 20-35 gm of total fiber daily;Understanding of distribution of calorie intake throughout the day with the consumption of 4-5 meals/snacks    Improve shortness of breath with ADL's Yes    Intervention Provide education, individualized exercise plan and daily activity instruction to help decrease symptoms of SOB with activities of daily living.    Expected Outcomes Short Term: Improve cardiorespiratory fitness to achieve a reduction of symptoms when performing ADLs;Long Term: Be able to perform more ADLs without symptoms or delay the onset of symptoms           Education:Diabetes - Individual verbal and written instruction to review signs/symptoms of diabetes, desired ranges of glucose level fasting, after meals and with exercise. Acknowledge that pre and post exercise glucose checks will be done for 3 sessions at entry of program.   Know Your Numbers and Heart Failure: - Group verbal and visual instruction to discuss disease risk factors for cardiac and pulmonary disease and treatment options.  Reviews associated critical values for Overweight/Obesity, Hypertension, Cholesterol, and Diabetes.  Discusses basics of heart failure: signs/symptoms and treatments.  Introduces Heart Failure Zone chart for action plan for heart failure. Written material provided at class time.   Core Components/Risk Factors/Patient Goals Review:   Goals and Risk Factor Review     Row Name 08/12/24 1642             Core Components/Risk Factors/Patient Goals Review   Personal Goals Review Weight Management/Obesity;Improve shortness of breath with ADL's;Develop more efficient breathing techniques such as purse lipped breathing and diaphragmatic breathing and practicing self-pacing with activity.       Review Monthly review of patient's Core Components/Risk Factors/Patient Goals are as follows: Goal in progress for improving her shortness of breath with ADLs and developing more efficient breathing techniques such as purse lipped breathing and diaphragmatic breathing; and practicing self-pacing with activity. Staff is working with her on her breathing techniques. Her oxygen saturation has been WNL on room air with exertion. She is trying hard to build up her endurance and stamina to complete activities of daily living at home with decreased shortness of breath. Goal progressing on weight loss. Colleena has not started Ozempic  yet to aid in her weight loss. Shia is waiting for insurance approval. She is working with our dietician to decrease her caloric intake. Ethelreda will  continue to benefit from PR for nutrition, education, exercise, and lifestyle modification.       Expected Outcomes Pt will show progress toward meeting expected goals and outcomes.          Core Components/Risk Factors/Patient Goals at Discharge (Final Review):   Goals and Risk Factor Review - 08/12/24 1642       Core Components/Risk Factors/Patient Goals Review   Personal Goals Review Weight Management/Obesity;Improve shortness of breath with ADL's;Develop more efficient breathing techniques such as purse lipped breathing and diaphragmatic breathing and practicing self-pacing with activity.    Review Monthly review of patient's Core Components/Risk Factors/Patient Goals are as follows: Goal in progress for improving her shortness of breath with ADLs and developing more efficient breathing techniques such as purse lipped breathing and diaphragmatic breathing; and practicing self-pacing with activity. Staff is working with her on  her breathing techniques. Her oxygen saturation has been WNL on room air with exertion. She is trying hard to build up her endurance and stamina to complete activities of daily living at home with decreased shortness of breath. Goal progressing on weight loss. Roe has not started Ozempic  yet to aid in her weight loss. Surina is waiting for insurance approval. She is working with our dietician to decrease her caloric intake. Manaal will continue to benefit from PR for nutrition, education, exercise, and lifestyle modification.    Expected Outcomes Pt will show progress toward meeting expected goals and outcomes.          ITP Comments: Pt is making expected progress toward Pulmonary Rehab goals after completing 4 session(s). Recommend continued exercise, life style modification, education, and utilization of breathing techniques to increase stamina and strength, while also decreasing shortness of breath with exertion.    Comments: Dr. Slater Staff is Medical  Director for Pulmonary Rehab at [facility].

## 2024-08-20 ENCOUNTER — Encounter (HOSPITAL_COMMUNITY)
Admission: RE | Admit: 2024-08-20 | Discharge: 2024-08-20 | Disposition: A | Discharge status: Home / Self Care | Source: Ambulatory Visit | Attending: Pulmonary Disease | Admitting: Pulmonary Disease

## 2024-08-20 DIAGNOSIS — I5032 Chronic diastolic (congestive) heart failure: Secondary | ICD-10-CM | POA: Insufficient documentation

## 2024-08-20 NOTE — Progress Notes (Signed)
 Daily Session Note  Patient Details  Name: Nishat Livingston MRN: [id number] Date of Birth: 01/17/1951 Referring Provider:   Conrad Ports Pulmonary Rehab Walk Test from 07/22/2024 in [facility]  Referring Provider Dr. Shelda Bruckner    Encounter Date: 08/20/2024  Check In:  Session Check In - 08/20/24 1337       Check-In   Supervising physician immediately available to respond to emergencies [facility] MD immediately available    Physician(s) Barnie Hila, NP    Location [facility]    Staff Present Johnnie Moats, MS, ACSM-CEP, Exercise Physiologist;Casey Claudene Candia Levin, RN, BSN;Randi Reeve BS, ACSM-CEP, Exercise Physiologist;Bailey Elnor, MS, Exercise Physiologist    Virtual Visit No    Medication changes reported     No    Fall or balance concerns reported    No    Comments States has been off balance, sometimes uses can    Tobacco Cessation No Change    Warm-up and Cool-down Performed as group-led instruction    Resistance Training Performed Yes    VAD Patient? No    PAD/SET Patient? No      Pain Assessment   Currently in Pain? No/denies    Pain Score 0-No pain    Multiple Pain Sites No          Capillary Blood Glucose: No results found for this or any previous visit (from the past 24 hours).    Social History   Tobacco Use  Smoking Status Never   Passive exposure: Past  Smokeless Tobacco Never    Goals Met:  Proper associated with RPD/PD & O2 Sat Exercise tolerated well No report of concerns or symptoms today Strength training completed today  Goals Unmet:  Not Applicable  Comments: Service time is from 1310 to 1436.    Dr. Slater Staff is Medical Director for Pulmonary Rehab at [facility].

## 2024-08-25 ENCOUNTER — Encounter (HOSPITAL_COMMUNITY)
Admission: RE | Admit: 2024-08-25 | Discharge: 2024-08-25 | Disposition: A | Discharge status: Home / Self Care | Source: Ambulatory Visit | Attending: Pulmonary Disease | Admitting: Pulmonary Disease

## 2024-08-25 ENCOUNTER — Telehealth (HOSPITAL_COMMUNITY): Payer: Self-pay | Admitting: *Deleted

## 2024-08-25 NOTE — Telephone Encounter (Signed)
 Returned Denise [name]'s call. She is experiencing ongoing back pain exacerbated by exercise. She feels she cannot continue PR at this time. We discussed water aerobics. Will discharge from PR.  Aliene Aris BS, ACSM-CEP 08/25/2024 3:54 PM

## 2024-08-26 NOTE — Progress Notes (Deleted)
  Start: *** end: *** Patient is here today *** Patient would like to learn *** Patient lives with ***.  *** shopping and cooking.  History includes:  *** Medications include:  *** Labs noted:  ***   6.4%, ozempic

## 2024-08-27 ENCOUNTER — Ambulatory Visit: Admitting: Endocrinology

## 2024-08-27 ENCOUNTER — Encounter (HOSPITAL_COMMUNITY)

## 2024-08-28 ENCOUNTER — Other Ambulatory Visit: Payer: HMO

## 2024-08-28 NOTE — Progress Notes (Signed)
 Discharge Progress Report  Patient Details  Name: Denise [name] MRN: [id number] Date of Birth: [birth year] Referring Provider:   [facility] Walk Test from 07/22/2024 in [facility]  Referring Provider Dr. Shelda Bruckner     Number of Visits: 5  Reason for Discharge:  Early Exit:  Personal  Smoking History:  Social History   Tobacco Use  Smoking Status Never   Passive exposure: Past  Smokeless Tobacco Never    Diagnosis:  Heart failure, diastolic, chronic (HCC)  ADL UCSD:  Pulmonary Assessment Scores     Row Name 07/22/24 1507         ADL UCSD   ADL Phase Entry     SOB Score total 55       CAT Score   CAT Score 33       mMRC Score   mMRC Score 4        Initial Exercise Prescription:  Initial Exercise Prescription - 07/22/24 1500       Date of Initial Exercise RX and Referring Provider   Date 07/22/24    Referring Provider Dr. Shelda Bruckner    Expected Discharge Date 10/15/24      Recumbant Elliptical   Level 1    RPM 60    Watts 45    Minutes 15    METs 1.8      Track   Laps 5    Minutes 15    METs 1.62      Prescription Details   Frequency (times per week) 2    Duration Progress to 30 minutes of continuous aerobic without signs/symptoms of physical distress      Intensity   THRR 40-80% of Max Heartrate 59-118    Ratings of Perceived Exertion 11-13    Perceived Dyspnea 0-4      Progression   Progression Continue to progress workloads to maintain intensity without signs/symptoms of physical distress.      Resistance Training   Training Prescription Yes    Weight red bands    Reps 10-15          Discharge Exercise Prescription (Final Exercise Prescription Changes):  Exercise Prescription Changes - 08/13/24 1448       Response to Exercise   Blood Pressure (Admit) 104/70    Blood Pressure (Exit) 102/60    Heart Rate (Admit) 99 bpm     Heart Rate (Exercise) 117 bpm    Heart Rate (Exit) 91 bpm    Oxygen Saturation (Admit) 96 %    Oxygen Saturation (Exercise) 93 %    Oxygen Saturation (Exit) 92 %    Rating of Perceived Exertion (Exercise) 12    Perceived Dyspnea (Exercise) 2    Duration Continue with 30 min of aerobic exercise without signs/symptoms of physical distress.    Intensity THRR unchanged      Resistance Training   Training Prescription Yes    Weight red bands    Reps 10-15    Time 10 Minutes      Recumbant Elliptical   Level 1    Minutes 15    METs 2      Track   Laps 5    Minutes 15    METs 1.77          Functional Capacity:  6 Minute Walk     Row Name 07/22/24 1500         6 Minute Walk  Phase Initial     Distance 918 feet     Walk Time 6 minutes     # of Rest Breaks 3  3:46-4:03, 5:04-5:16, 5:40-6     MPH 1.74     METS 1.32     RPE 14     Perceived Dyspnea  2     VO2 Peak 4.61     Symptoms Yes (comment)     Comments dyspnea, lower back pain     Resting HR 81 bpm     Resting BP 108/64     Resting Oxygen Saturation  94 %     Exercise Oxygen Saturation  during 6 min walk 93 %     Max Ex. HR 112 bpm     Max Ex. BP 132/72     2 Minute Post BP 110/66       Interval HR   1 Minute HR 89     2 Minute HR 93     3 Minute HR 100     4 Minute HR 107     5 Minute HR 112     6 Minute HR 112     2 Minute Post HR 91     Interval Heart Rate? Yes       Interval Oxygen   Interval Oxygen? Yes     Baseline Oxygen Saturation % 95 %     1 Minute Oxygen Saturation % 94 %     1 Minute Liters of Oxygen 0 L     2 Minute Oxygen Saturation % 93 %     2 Minute Liters of Oxygen 0 L     3 Minute Oxygen Saturation % 94 %     3 Minute Liters of Oxygen 0 L     4 Minute Oxygen Saturation % 94 %     4 Minute Liters of Oxygen 0 L     5 Minute Oxygen Saturation % 93 %     5 Minute Liters of Oxygen 0 L     6 Minute Oxygen Saturation % 94 %     6 Minute Liters of Oxygen 0 L     2 Minute Post  Oxygen Saturation % 94 %     2 Minute Post Liters of Oxygen 0 L        Psychological, QOL, Others - Outcomes: PHQ 2/9:    07/22/2024    1:26 PM 07/07/2024    9:02 AM 04/09/2024   10:07 AM 01/01/2024    1:55 PM 07/31/2023    8:06 AM  Depression screen PHQ 2/9  Decreased Interest 1 0 1 0 1  Down, Depressed, Hopeless 1 0 1 0 1  PHQ - 2 Score 2 0 2 0 2  Altered sleeping 2  1  1   Tired, decreased energy 2  2  1   Change in appetite 1  2  1   Feeling bad or failure about yourself  1  1  1   Trouble concentrating 1  1  1   Moving slowly or fidgety/restless 0  1  0  Suicidal thoughts 0  0  0  PHQ-9 Score 9  10  7   Difficult doing work/chores Not difficult at all    Somewhat difficult    Quality of Life:   Personal Goals: Goals established at orientation with interventions provided to work toward goal.  Personal Goals and Risk Factors at Admission - 07/22/24 1347       Core Components/Risk Factors/Patient  Goals on Admission    Weight Management Yes;Obesity;Weight Loss    Intervention Weight Management: Develop a combined nutrition and exercise program designed to reach desired caloric intake, while maintaining appropriate intake of nutrient and fiber, sodium and fats, and appropriate energy expenditure required for the weight goal.;Weight Management: Provide education and appropriate resources to help participant work on and attain dietary goals.;Weight Management/Obesity: Establish reasonable short term and long term weight goals.;Obesity: Provide education and appropriate resources to help participant work on and attain dietary goals.    Admit Weight 254 lb 6.6 oz (115.4 kg)    Goal Weight: Short Term 240 lb (108.9 kg)    Goal Weight: Long Term 200 lb (90.7 kg)    Expected Outcomes Short Term: Continue to assess and modify interventions until short term weight is achieved;Long Term: Adherence to nutrition and physical activity/exercise program aimed toward attainment of established weight  goal;Weight Loss: Understanding of general recommendations for a balanced deficit meal plan, which promotes 1-2 lb weight loss per week and includes a negative energy balance of [phone] kcal/d;Understanding recommendations for meals to include 15-35% energy as protein, 25-35% energy from fat, 35-60% energy from carbohydrates, less than 200mg  of dietary cholesterol, 20-35 gm of total fiber daily;Understanding of distribution of calorie intake throughout the day with the consumption of 4-5 meals/snacks    Improve shortness of breath with ADL's Yes    Intervention Provide education, individualized exercise plan and daily activity instruction to help decrease symptoms of SOB with activities of daily living.    Expected Outcomes Short Term: Improve cardiorespiratory fitness to achieve a reduction of symptoms when performing ADLs;Long Term: Be able to perform more ADLs without symptoms or delay the onset of symptoms           Personal Goals Discharge:  Goals and Risk Factor Review     Row Name 08/12/24 1642 08/28/24 1000           Core Components/Risk Factors/Patient Goals Review   Personal Goals Review Weight Management/Obesity;Improve shortness of breath with ADL's;Develop more efficient breathing techniques such as purse lipped breathing and diaphragmatic breathing and practicing self-pacing with activity. Weight Management/Obesity;Improve shortness of breath with ADL's;Develop more efficient breathing techniques such as purse lipped breathing and diaphragmatic breathing and practicing self-pacing with activity.      Review Monthly review of patient's Core Components/Risk Factors/Patient Goals are as follows: Goal in progress for improving her shortness of breath with ADLs and developing more efficient breathing techniques such as purse lipped breathing and diaphragmatic breathing; and practicing self-pacing with activity. Staff is working with her on her breathing techniques. Her oxygen saturation  has been WNL on room air with exertion. She is trying hard to build up her endurance and stamina to complete activities of daily living at home with decreased shortness of breath. Goal progressing on weight loss. Teandra has not started Ozempic  yet to aid in her weight loss. Crosby is waiting for insurance approval. She is working with our dietician to decrease her caloric intake. Jelitza will continue to benefit from PR for nutrition, education, exercise, and lifestyle modification. Patsie was discharged from the PR program on 08/25/24. She no longer wanted to particpate in the program due to ongoing back pain. She unfortunately did not meet her goals due to only attending 5 sessions.      Expected Outcomes Pt will show progress toward meeting expected goals and outcomes. To continue to exercise and modify her nutrition and lifestyle post discharge  Exercise Goals and Review:  Exercise Goals     Row Name 07/22/24 1324             Exercise Goals   Increase Physical Activity Yes       Intervention Provide advice, education, support and counseling about physical activity/exercise needs.;Develop an individualized exercise prescription for aerobic and resistive training based on initial evaluation findings, risk stratification, comorbidities and participant's personal goals.       Expected Outcomes Short Term: Attend rehab on a regular basis to increase amount of physical activity.;Long Term: Exercising regularly at least 3-5 days a week.;Long Term: Add in home exercise to make exercise part of routine and to increase amount of physical activity.       Increase Strength and Stamina Yes       Intervention Provide advice, education, support and counseling about physical activity/exercise needs.;Develop an individualized exercise prescription for aerobic and resistive training based on initial evaluation findings, risk stratification, comorbidities and participant's personal goals.       Expected  Outcomes Short Term: Increase workloads from initial exercise prescription for resistance, speed, and METs.;Short Term: Perform resistance training exercises routinely during rehab and add in resistance training at home;Long Term: Improve cardiorespiratory fitness, muscular endurance and strength as measured by increased METs and functional capacity ( )       Able to understand and use rate of perceived exertion (RPE) scale Yes       Intervention Provide education and explanation on how to use RPE scale       Expected Outcomes Short Term: Able to use RPE daily in rehab to express subjective intensity level;Long Term:  Able to use RPE to guide intensity level when exercising independently       Able to understand and use Dyspnea scale Yes       Intervention Provide education and explanation on how to use Dyspnea scale       Expected Outcomes Short Term: Able to use Dyspnea scale daily in rehab to express subjective sense of shortness of breath during exertion;Long Term: Able to use Dyspnea scale to guide intensity level when exercising independently       Knowledge and understanding of Target Heart Rate Range (THRR) Yes       Intervention Provide education and explanation of THRR including how the numbers were predicted and where they are located for reference       Expected Outcomes Short Term: Able to state/look up THRR;Short Term: Able to use daily as guideline for intensity in rehab;Long Term: Able to use THRR to govern intensity when exercising independently       Understanding of Exercise Prescription Yes       Intervention Provide education, explanation, and written materials on patient's individual exercise prescription       Expected Outcomes Short Term: Able to explain program exercise prescription;Long Term: Able to explain home exercise prescription to exercise independently          Exercise Goals Re-Evaluation:  Exercise Goals Re-Evaluation     Row Name 08/18/24 0835 08/25/24 1556            Exercise Goal Re-Evaluation   Exercise Goals Review Increase Physical Activity;Able to understand and use Dyspnea scale;Understanding of Exercise Prescription;Increase Strength and Stamina;Knowledge and understanding of Target Heart Rate Range (THRR);Able to understand and use rate of perceived exertion (RPE) scale Increase Physical Activity;Able to understand and use Dyspnea scale;Understanding of Exercise Prescription;Increase Strength and Stamina;Knowledge and understanding of Target Heart Rate  Range (THRR);Able to understand and use rate of perceived exertion (RPE) scale      Comments Pt has completed 4 exercise sessions, missing 1. She has been walking the track for 15 min, 2.39 METs. She then has been exercising on the recumbent elliptical for 15 min, METs 2. However she is complaining of groin pain on the elliptical therefore she will move to the recumbent stepper next session, level 1. she performs warm up and cool down without significant limitations, using blue bands, 5.8 lbs. Will continue to progress as tolerated. Pt completed 5 exercise sessions. She had back pain exacerbations from exercise and needs to discuss a new plan with her orthopedist. Pt decided to discharge from the program. She sts she was improving cardiovascularly and enjoyed the program. Max METs were 2.0.      Expected Outcomes Through exercise at rehab and home, the patient will decrease shortness of breath and feel confident in carrying out an exercise regimen at home. Through exercise at rehab and home, the patient will decrease shortness of breath and feel confident in carrying out an exercise regimen at home.         Nutrition & Weight - Outcomes:  Pre Biometrics - 07/22/24 1511       Pre Biometrics   Grip Strength 12 kg           Nutrition:  Nutrition Therapy & Goals - 07/28/24 1603       Nutrition Therapy   Diet General Healthy Diet    Drug/Food Interactions Statins/Certain Fruits       Personal Nutrition Goals   Nutrition Goal Patient to improve diet quality by using the plate method as a guide for meal planning to include lean protein/plant protein, fruits, vegetables, whole grains, nonfat dairy as part of a well-balanced diet.    Comments Patient has medical history of HTN, hyperlipidemia, CHF, DM2. She recently started Ozempic  in July to aid with continued blood sugar support and weight loss. LDL is well controlled. Patient will benefit from participation in pulmonary rehab for nutrition education, exercise, and lifestyle modification.      Intervention Plan   Intervention Prescribe, educate and counsel regarding individualized specific dietary modifications aiming towards targeted core components such as weight, hypertension, lipid management, diabetes, heart failure and other comorbidities.;Nutrition handout(s) given to patient.    Expected Outcomes Short Term Goal: Understand basic principles of dietary content, such as calories, fat, sodium, cholesterol and nutrients.;Long Term Goal: Adherence to prescribed nutrition plan.          Nutrition Discharge:   Education Questionnaire Score:  Knowledge Questionnaire Score - 07/22/24 1507       Knowledge Questionnaire Score   Pre Score 16/18          Goals reviewed with patient; copy given to patient.

## 2024-09-01 ENCOUNTER — Encounter (HOSPITAL_COMMUNITY)

## 2024-09-01 ENCOUNTER — Encounter (HOSPITAL_BASED_OUTPATIENT_CLINIC_OR_DEPARTMENT_OTHER): Payer: Self-pay

## 2024-09-01 MED ORDER — TORSEMIDE 20 MG PO TABS: 20.0000 mg | ORAL_TABLET | Freq: Every day | ORAL | 0 refills | Status: DC

## 2024-09-03 ENCOUNTER — Encounter (HOSPITAL_COMMUNITY)

## 2024-09-04 ENCOUNTER — Telehealth: Payer: Self-pay

## 2024-09-04 ENCOUNTER — Ambulatory Visit: Admitting: Dietician

## 2024-09-04 DIAGNOSIS — E66813 Obesity, class 3: Secondary | ICD-10-CM

## 2024-09-04 DIAGNOSIS — E118 Type 2 diabetes mellitus with unspecified complications: Secondary | ICD-10-CM

## 2024-09-04 NOTE — Telephone Encounter (Signed)
 Copied from CRM #[id number]. Topic: General - Other >> Sep 04, 2024 10:06 AM Pinkey ORN wrote: Reason for CRM: Emotional Support Animal >> Sep 04, 2024 10:09 AM Pinkey ORN wrote: Katheryn - Pet Screening [phone] (confidential line)  Called on behalf of patient, states she received a letter in regards to patient receiving an emotional support animal. Katheryn states she just needs confirmation that Gretta Comer POUR, NP did in fact write the letter, she just needs patients first and last name and rather or not physician wrote the letter. Please follow up.

## 2024-09-04 NOTE — Telephone Encounter (Signed)
 Letter completed and placed in Denise [name]'s inbox.

## 2024-09-04 NOTE — Telephone Encounter (Addendum)
 Faxed info to Pet Screening Verification Dept at [phone].  Spoke with pt notifying her of above info. Informed her I will mail her the ppw for her records (minus Kate's letter since pt already received that via MyChart). Pt verbalizes understanding and expresses her thanks.   [Mailed info to pt. Made copy to scan.]

## 2024-09-08 ENCOUNTER — Encounter (HOSPITAL_COMMUNITY)

## 2024-09-10 ENCOUNTER — Encounter (HOSPITAL_COMMUNITY)

## 2024-09-13 ENCOUNTER — Encounter (HOSPITAL_BASED_OUTPATIENT_CLINIC_OR_DEPARTMENT_OTHER): Payer: Self-pay

## 2024-09-15 ENCOUNTER — Encounter (HOSPITAL_COMMUNITY)

## 2024-09-17 ENCOUNTER — Encounter (HOSPITAL_COMMUNITY)

## 2024-09-18 NOTE — Progress Notes (Deleted)
  Start: *** end: *** Patient is here today *** Patient would like to learn *** Patient lives with ***.  *** shopping and cooking.  History includes:  *** Medications include:  *** Labs noted:  ***   6.4%, ozempic

## 2024-09-22 ENCOUNTER — Ambulatory Visit: Admitting: Endocrinology

## 2024-09-22 ENCOUNTER — Encounter (HOSPITAL_COMMUNITY)

## 2024-09-23 NOTE — Telephone Encounter (Signed)
 Lvm for pt to give weights and go over recommendations per Rosaline Percy PIETY.

## 2024-09-24 ENCOUNTER — Encounter (HOSPITAL_COMMUNITY)

## 2024-09-24 DIAGNOSIS — M4727 Other spondylosis with radiculopathy, lumbosacral region: Secondary | ICD-10-CM | POA: Diagnosis not present

## 2024-09-24 DIAGNOSIS — M4802 Spinal stenosis, cervical region: Secondary | ICD-10-CM | POA: Diagnosis not present

## 2024-09-24 DIAGNOSIS — M4722 Other spondylosis with radiculopathy, cervical region: Secondary | ICD-10-CM | POA: Diagnosis not present

## 2024-09-24 DIAGNOSIS — I5032 Chronic diastolic (congestive) heart failure: Secondary | ICD-10-CM | POA: Diagnosis not present

## 2024-09-24 DIAGNOSIS — M4712 Other spondylosis with myelopathy, cervical region: Secondary | ICD-10-CM | POA: Diagnosis not present

## 2024-09-25 ENCOUNTER — Ambulatory Visit: Admitting: Dietician

## 2024-09-25 DIAGNOSIS — Z6841 Body Mass Index (BMI) 40.0 and over, adult: Secondary | ICD-10-CM

## 2024-09-25 DIAGNOSIS — E118 Type 2 diabetes mellitus with unspecified complications: Secondary | ICD-10-CM

## 2024-09-28 ENCOUNTER — Telehealth: Payer: Self-pay

## 2024-09-28 NOTE — Telephone Encounter (Signed)
   Pre-operative Risk Assessment    Patient Name: Denise [name]  DOB: 06-Sep-1951 MRN: [id number]   Date of last office visit: 07/23/24 Date of next office visit: Not scheduled   Request for Surgical Clearance    Procedure:  C4-5, C5-6, C6-7 Anterior Cervical Discectomy & Fusion  Date of Surgery:  Clearance TBD                                Surgeon:  Dr. Aliene Gravely Surgeon's Group or Practice Name:  [facility] Phone number:  [phone] Fax number:  [phone]   Type of Clearance Requested:   - Medical    Type of Anesthesia:  General    Additional requests/questions:    Bonney Ival LOISE Gerome   09/28/2024, 4:44 PM

## 2024-09-29 ENCOUNTER — Encounter (HOSPITAL_COMMUNITY)

## 2024-09-29 NOTE — Telephone Encounter (Signed)
   Name: Denise [name]  DOB: [birth year]  MRN: [id number]  Primary Cardiologist: Shelda Bruckner, MD  Chart reviewed as part of pre-operative protocol coverage. Because of [name]'s past medical history and time since last visit, she will require a follow-up telephone visit in order to better assess preoperative cardiovascular risk.  Pre-op covering staff: - Please schedule appointment and call patient to inform them. If patient already had an upcoming appointment within acceptable timeframe, please add pre-op clearance to the appointment notes so provider is aware. - Please contact requesting surgeon's office via preferred method (i.e, phone, fax) to inform them of need for appointment prior to surgery.  No medications indicated as needing held.   Patient does live in [place].   Orren LOISE Fabry, PA-C  09/29/2024, 8:12 AM

## 2024-09-29 NOTE — Telephone Encounter (Signed)
 S/w the pt and she is agreeable to tele preop appt, butt he said she really wants to think about this surgery. She states she knows she needs it but is on the fence about it. Pt said she might not even have this until beginning of the year and asked if she can call back to schedule once she decides. I said that will be fine and she call [phone] and ask to s/w the preop team. Pt thanked me for the help today.

## 2024-09-30 ENCOUNTER — Ambulatory Visit: Admitting: Primary Care

## 2024-10-01 ENCOUNTER — Encounter (HOSPITAL_COMMUNITY)

## 2024-10-02 ENCOUNTER — Other Ambulatory Visit: Payer: Self-pay | Admitting: Medical Genetics

## 2024-10-06 ENCOUNTER — Ambulatory Visit: Payer: Self-pay

## 2024-10-06 ENCOUNTER — Encounter (HOSPITAL_COMMUNITY)

## 2024-10-06 NOTE — Telephone Encounter (Signed)
 FYI Only or Action Required?: FYI only for provider.  Patient was last seen in primary care on 07/07/2024 by Gretta Comer POUR, NP.  Called Nurse Triage reporting Hypertension.  Symptoms began several weeks ago.  Interventions attempted: Rest, hydration, or home remedies.  Symptoms are: gradually worsening.  Triage Disposition: Go to ED Now (or PCP Triage)  Patient/caregiver understands and will follow disposition?: No   PT refused ER.    Copied from CRM [id number]. Topic: Clinical - Red Word Triage >> Oct 06, 2024  4:15 PM Drema MATSU wrote: Red Word that prompted transfer to Nurse Triage: Patient has low blood pressure (96/61, 97/67)and is usually in the 120s, weak, dizzy, very spotty close to passing out. Reason for Disposition  [1] Fall in systolic BP > 20 mm Hg from normal AND [2] feeling weak or lightheaded  Answer Assessment - Initial Assessment Questions PT states she was checking her BP regularly and got out of the habit. She states that she she started to feel weak so she checked it and it was low. Typically she is 110-120 systolic. She states about 5 weeks ago she was put on ozempic  for weight loss. She states it made her deathly sick so she stopped taking it. She stats that she does also have low iron and diastolic heart failure. She states that she is feeling lightheaded, sees black spots with standing. RN asked her to check BP on the phone with RN. 100/64 sitting, standing 90/67 HR 100 and symptomatic.    1. BLOOD PRESSURE: What is your blood pressure? Did you take at least two measurements 5 minutes apart?     1pm 96/61 2. ONSET: When did you take your blood pressure?      3. HOW: How did you take your blood pressure? (e.g., visiting nurse, automatic home BP monitor)     Automatic bp cuff 4. HISTORY: Do you have a history of low blood pressure? What is your blood pressure normally?     Not typically 5. MEDICINES: Are you taking any medicines for blood  pressure? If Yes, ask: Have they been changed recently?      Torsemide  and spironalactone 6. PULSE RATE: Do you know what your pulse rate is?      100 7. OTHER SYMPTOMS: Have you been sick recently? Have you had a recent injury?  Protocols used: Blood Pressure - Low-A-AH

## 2024-10-07 NOTE — Telephone Encounter (Signed)
 Please call patient:  Have her stop the spironolactone  medication for now and monitor blood pressure readings.  I also recommend that she contact cardiology for further instructions.  Also working on hydration with water.

## 2024-10-07 NOTE — Telephone Encounter (Signed)
 Unable to reach patient. Left voicemail to return call to our office.

## 2024-10-08 ENCOUNTER — Encounter (HOSPITAL_COMMUNITY)

## 2024-10-08 ENCOUNTER — Encounter (HOSPITAL_BASED_OUTPATIENT_CLINIC_OR_DEPARTMENT_OTHER): Payer: Self-pay

## 2024-10-08 NOTE — Telephone Encounter (Signed)
 Called patient and reviewed all information. Patient verbalized understanding. She will contact cardiology.  She also sent mychart message with additional BP readings.   Will call if any further questions.

## 2024-10-08 NOTE — Telephone Encounter (Signed)
 Noted. Will review mychart message

## 2024-10-12 ENCOUNTER — Encounter (HOSPITAL_BASED_OUTPATIENT_CLINIC_OR_DEPARTMENT_OTHER): Payer: Self-pay

## 2024-10-12 NOTE — Progress Notes (Addendum)
 " Cardiology Office Note:  .   Date:  10/15/2024  ID:  Denise [name], DOB [birth year], MRN [id number] PCP: Gretta Comer POUR, NP  [facility] Providers Cardiologist:  Shelda Bruckner, MD    Patient Profile: .      PMH Left bundle branch block Coronary calcium  score of 0 Chronic HFpEF Obesity Iron deficiency anemia Hypertension Family history CAD Father - MI mid 36s Grandmother - stroke GERD TIA in 2014  Referred to cardiology and seen by Dr. Alveta for evaluation of shortness of breath on 07/16/2022. Had recent TTE 07/09/22 that revealed normal LVEF 55-60%, no rwma, indeterminate diastolic parameters, and only trivial.  She reported leg edema and running out of breath while walking and dressing, as well as two-pillow orthopnea. Symptoms felt to be secondary to HFpEF and she was started on GDMT over the course of several office visits including spironolactone , Jardiance , Entresto , and metoprolol .  She did not feel much improvement on Lasix  and was subsequently switched to torsemide . She had difficulty affording Entresto  and during times of financial burden, she was on valsartan .  She underwent coronary CTA 08/28/2022 which revealed no evidence of CAD and coronary calcium  score of 0.  7-day ZIO monitor revealed predominant rhythm sinus rhythm, 1 run of SVT lasting 8 beats.  Symptoms persisted despite good medical therapy and she underwent cardiac MR on 01/30/2023 which revealed normal LV size and function with LVEF 56%, no myocardial LGE or scar, no evidence for myocardial inflammation or infiltration, no evidence for myocarditis, normal RV size and function.  She reported significant weight gain since she had retired for a total of 100 lbs over the course of a couple of years.  She was found to be anemic and was started on iron.  She continued to feel fatigued but orthopnea improved.  Last cardiology clinic visit was 02/11/2023 with Dr. Alveta.  She reported difficulty  exercising due to back issues.  She did not appear volume overloaded and no medication changes were made.  She was advised to return in 1 year for follow-up.  She contacted our office 09/24/2023 to report shortness of breath that had worsened over the previous 2 months.  She was also feeling abdominal distention and nausea she had stopped Jardiance  and Entresto  due to cost but was planning to get Entresto  filled for 1 month. Our nursing team could not reach her to triage her symptoms.   Seen in clinic by me on 11/05/23. She was considering having colonoscopy but decided to postpone. She continued to note fluid retention in her abdomen. Occasional leg and arm swelling. Has difficulty with stairs due to breathlessness and hip pain. No orthopnea or PND. Gets out of breath with walking at times. Has had financial difficulty getting Entresto  and Jardiance  and has stopped them intermittently. We enrolled her in the [employer] grant which will cover the cost of Jardiance  and Entresto . Reports fluctuations in weight, which she monitors but not daily.  Admits she eats out frequently which she acknowledges may be high in sodium. She expresses a desire to improve her diet and is open to participating in an exercise program. No presyncope or syncope. We referred her to cardiac rehab and planned to check BNP, however there is no record that it was completed. Furosemide  was resumed because she felt a better response when taking it over torsemide .   She returned to clinic 01/02/24 and was seen by Artist Pouch, PA for persistent DOE. She described feeling like I swallowed a  water balloon. Reported HR up to 130s at times. She had resumed torsemide . BNP mildly elevated at 457, Scr 1.57, K+ 5.1. She was advised to hold torsemide  and spironolactone  for 2 days and d/c potassium supplement. Follow-up BMET on 01/07/24 revealed K+ 4.6 and Scr 0.94. She was advised to resume spironolactone  and torsemide .   Seen by me on 02/04/24  reporting rapid heart rate during activities such as washing dishes and taking a shower, with elevations up to 140 bpm. Feeling short of breath when HR is elevated. Generally takes about 10-15 minutes for the heart rate to return to normal. This has been limiting her physical activities. She also reports feeling weak and dizzy at times, which she suspects could be related to her medications. At times, she feels fluid overloaded, particularly with abdominal distention. She rarely has edema in her extremities. Weight fluctuates, with the highest recorded weight being 258 lbs at home. Feels that base weight is in the upper 240s. She has started Mounjaro  for weight loss. Reports that since resuming Jardiance  and Entresto  on a regular basis in January, she has overall felt an improvement in her fluid status and general health. Previously, cost was an issue but we were able to get her the [employer] grant which is covering the cost of these medications. Working on weight loss through dietary changes. She has cut out sweets, chips, and sodas from her diet and has been consuming more fruits and vegetables. No chest pain, orthopnea, PND, presyncope, or syncope. We completed a walk test around the office and she became short of breath walking about 500 feet. No evidence of CAD on coronary CTA 08/2022. I suspect DOE may be at least somewhat secondary to deconditioning. Will refer her to pulmonary rehab.   Echo 02/10/24 revealed nearly normal LVEF 50 to 55%, septal lateral dyssynchrony C consistent with LBBB, G1 DD, mildly reduced RV SF, no significant valve disease.  Carotid duplex 02/14/2024 revealed no carotid stenosis R ICA, only mild stenosis in LICA (1-39%).   On 06/14/2024 she reported continued shortness of breath and HR elevations 100-130 BPM.  She wanted to try increased dosing of torsemide .  Advised her to take an additional 20 mg of torsemide  for 3 days and note improvement.  Seen by me on 07/23/24 for follow-up of  chronic HFpEF. Experiencing shortness of breath and fluid retention, worse if she does not take torsemide  daily. Noting distention in her abdomen leading to breathlessness during activities like grocery shopping. Over the previous 1-2 weeks, she experienced dizziness characterized by losing balance while walking, without vertigo. Has aching in legs and is concerned about her potassium levels, as she has not been consistently taking her potassium supplement due to previously high levels. Swelling in her feet and hands, particularly after prolonged standing. Her feet swollen at office visit, with injury to a toe two days prior, causing pain and discomfort with shoes. Jardiance  was recently increased from 10 mg to 25 mg for blood sugar control. Has been limiting eating out because she is concerned about the sodium and how it contributes to weight gain. No chest pain, orthopnea, PND, presyncope or syncope. She was encouraged to try magnesium  supplement for leg cramps. BMET obtained that day revealed stable kidney function and K+ of 4.7. She was advised to continue GDMT including torsemide  20 mg daily, Entresto , metoprolol , empagliflozin , and spironolactone .  On 10/08/2024 she notified our office of low blood pressure and weakness.  She had been diagnosed with the flu a few days  prior.  Spironolactone  was stopped by PCP.  Had also started Ozempic .  She was advised to reduce Entresto  97-103 mg to half tablet twice daily, reduce metoprolol  50 mg to half tablet daily and reduce torsemide  20 mg to half tablet daily.       History of Present Illness: .    Discussed the use of AI scribe software for clinical note transcription with the patient, who gave verbal consent to proceed.  History of Present Illness Tabitha Ann Pestka is a very pleasant 73 year old female who is here today for follow-up of heart failure with recent hypotension and weakness. For the past week, she has had an illness that she is assuming is  the flu causing significant weakness and fatigue. Episodes of presyncope occuring upon standing, which caused her to see black but she did not pass out. She contacted our office and was advised to decrease medication doses due to hypotension, including Entresto  1/2 tablet of 97-103 mg twice daily, torsemide  10 mg daily, and Toprol  XL 25 mg daily. PCP had already advised her to d/c spironolactone . Prior to respiratory illness, in September, she noted fluid retention, particularly in her abdomen, and associated shortness of breath. She observed a weight fluctuation of three to four pounds, attributing it to fluid accumulation. She was taking torsemide  20 mg daily. Currently, she takes half a tablet of torsemide  and questions its efficacy compared to her previous medication. She regularly monitors her blood pressure, noting an increase over the past few days. She is concerned about hypertension risk, especially since she is on a lower dose of medications. She needs increase in HR with minimal exertion > 100 bpm. She takes additional lopressor  25 mg for HR > 120 bpm. She maintains a log book for heart failure, tracking her weight, blood pressure, heart rate, and respiration, which she finds helpful for managing her condition. At the time she became ill with respiratory illness, she took Ozempic  for one month. She did not feel that she tolerated the medication well. She was unable to complete pulmonary rehab due to back pain secondary to spinal stenosis. She is contemplating back surgery.  Discussed the use of AI scribe software for clinical note transcription with the patient, who gave verbal consent to proceed.   ROS: See HPI       Studies Reviewed: .        Risk Assessment/Calculations:             Physical Exam:   VS:  BP 102/66 (BP Location: Right Arm, Patient Position: Sitting, Cuff Size: Large)   Pulse 90   Ht 5' 4.5 (1.638 m)   Wt 252 lb (114.3 kg)   SpO2 99%   BMI 42.59 kg/m    Wt Readings  from Last 3 Encounters:  10/14/24 252 lb (114.3 kg)  08/13/24 255 lb 11.7 oz (116 kg)  08/04/24 253 lb 1.4 oz (114.8 kg)    GEN: Obese, well developed in no acute distress NECK: No JVD; No carotid bruits CARDIAC: RRR, no murmurs, rubs, gallops RESPIRATORY:  Clear to auscultation without rales, wheezing or rhonchi  ABDOMEN: Soft, non-tender, mild distention EXTREMITIES:  No edema; No deformity     ASSESSMENT AND PLAN: .    Assessment & Plan Chronic HFpEF DOE LBBB Known LBBB. EF 53%, G1DD on echo 02/10/24. She notes occasional weight gain and abdominal distention. At times she feels torsemide  20 mg does not feel like enough medication to resolve symptoms. She is currently taking  torsemide  10 mg daily in the setting of recent respiratory illness, hypotension and presyncope. Appetite has been poor and she is subsequently down a few pounds. No PND, orthopnea, edema or chest pain currently. She limits dietary sodium and fluids. Is on reduced dose of Entresto  and metoprolol  and has discontinue spironolactone .  - Continue GDMT including 1/2 tablet Entresto  97-103 mg twice daily, Jardiance  25 mg daily, Toprol  XL 25 mg daily, torsemide  10 mg daily - Will check iron studies to ensure no significant anemia - Continue to monitor BP daily and report in 2 weeks, sooner if SBP consistently > 140 mmHg - Get BMET today to evaluate renal function and electrolytes - Will gradually up-titrate medications with elevation of blood pressure and/or if she develops symptoms concerning for CHF exacerbation - Monitor weight daily and take additional torsemide  if weight increases by more than 2 pounds in 24 hours or more than 5 pounds in a week  History of hypertension   Orthostatic hypotension Tachycardia Presyncope Blood pressure fluctuations in the setting of recent illness. BP is soft. She continues to have symptoms of orthostatic hypotension and presyncope with standing. No syncope. HR typically > 100 bpm when  she stands. She is hydrating well but admits appetite suppression. Goal is to stabilize blood pressure while avoiding hypotension and presyncope. Will continue current medication regimen today.  - Continue to monitor blood pressure regularly and report if consistently over 140/90 mmHg - Send BP readings in 1-2 weeks and adjust the medication regimen based on these readings - Favor increasing Toprol  XL to 50 mg when SBP consistently > 110 mmHg  Cough with wheezing   Persistent cough with wheezing, particularly in the upper chest, may be related to recent illness or bronchitis. Slight expiratory wheeze noted in upper left lung. No signs concerning for infection or respiratory distress.  -Monitor symptoms and consider a primary care appointment if cough worsens or with development of fever/chills, other symptoms concerning for infection - Consider using plain Mucinex with a full glass of water for expectoration  - Deep breathing exercises encouraged  Spinal stenosis  Surgery for spinal stenosis is being considered. It is high risk, but cardiac risk is moderate. Ensure heart failure is well controlled before surgery and discuss the risks and benefits of surgery with a neurosurgeon -Notify us  if you decide to proceed with surgery for official preoperative evaluation      Disposition: 3 months with Dr. Lonni  Signed, Rosaline Bane, NP-C "

## 2024-10-13 ENCOUNTER — Ambulatory Visit (HOSPITAL_COMMUNITY)

## 2024-10-14 ENCOUNTER — Ambulatory Visit (HOSPITAL_BASED_OUTPATIENT_CLINIC_OR_DEPARTMENT_OTHER): Admitting: Nurse Practitioner

## 2024-10-14 ENCOUNTER — Encounter (HOSPITAL_BASED_OUTPATIENT_CLINIC_OR_DEPARTMENT_OTHER): Payer: Self-pay | Admitting: Nurse Practitioner

## 2024-10-14 VITALS — BP 102/66 | HR 90 | Ht 64.5 in | Wt 252.0 lb

## 2024-10-14 DIAGNOSIS — R42 Dizziness and giddiness: Secondary | ICD-10-CM | POA: Diagnosis not present

## 2024-10-14 DIAGNOSIS — I447 Left bundle-branch block, unspecified: Secondary | ICD-10-CM | POA: Diagnosis not present

## 2024-10-14 DIAGNOSIS — Z5181 Encounter for therapeutic drug level monitoring: Secondary | ICD-10-CM

## 2024-10-14 DIAGNOSIS — M4800 Spinal stenosis, site unspecified: Secondary | ICD-10-CM

## 2024-10-14 DIAGNOSIS — I5032 Chronic diastolic (congestive) heart failure: Secondary | ICD-10-CM | POA: Diagnosis not present

## 2024-10-14 DIAGNOSIS — I10 Essential (primary) hypertension: Secondary | ICD-10-CM | POA: Diagnosis not present

## 2024-10-14 DIAGNOSIS — R0602 Shortness of breath: Secondary | ICD-10-CM

## 2024-10-14 DIAGNOSIS — R000 Tachycardia, unspecified: Secondary | ICD-10-CM | POA: Diagnosis not present

## 2024-10-14 DIAGNOSIS — Z79899 Other long term (current) drug therapy: Secondary | ICD-10-CM | POA: Diagnosis not present

## 2024-10-14 DIAGNOSIS — R55 Syncope and collapse: Secondary | ICD-10-CM

## 2024-10-14 DIAGNOSIS — R052 Subacute cough: Secondary | ICD-10-CM | POA: Diagnosis not present

## 2024-10-14 MED ORDER — METOPROLOL SUCCINATE ER 50 MG PO TB24: 25.0000 mg | ORAL_TABLET | Freq: Every day | ORAL | Status: DC

## 2024-10-14 MED ORDER — TORSEMIDE 20 MG PO TABS: 10.0000 mg | ORAL_TABLET | Freq: Every day | ORAL | Status: DC

## 2024-10-14 MED ORDER — SACUBITRIL-VALSARTAN 97-103 MG PO TABS: ORAL_TABLET | ORAL | Status: DC

## 2024-10-14 NOTE — Patient Instructions (Addendum)
 Medication Instructions:  TRY PLAIN MUCINEX AS NEEDED FOR COUGH   Labwork: BMET/IRON PANEL TODAY   Testing/Procedures: NONE  Follow-Up: KEEP AS SCHEDULED   Any Other Special Instructions Will Be Listed Below (If Applicable). DO NOT MAKE ANY MEDICATION CHANGES UNTIL YOU HAVE DISCUSSED WITH PROVIDER   MONITOR AND LOG BLOOD PRESSURE DAILY. SEND READINGS VIA MYCHART IN 2 WEEKS, SOONER IF BLOOD PRESSURE IS CONSISTENTLY ABOVE 140/80

## 2024-10-15 ENCOUNTER — Ambulatory Visit (HOSPITAL_COMMUNITY)

## 2024-10-15 ENCOUNTER — Encounter (HOSPITAL_BASED_OUTPATIENT_CLINIC_OR_DEPARTMENT_OTHER): Payer: Self-pay | Admitting: Nurse Practitioner

## 2024-10-15 ENCOUNTER — Ambulatory Visit: Payer: Self-pay | Admitting: Nurse Practitioner

## 2024-10-15 LAB — BASIC METABOLIC PANEL WITH GFR
BUN/Creatinine Ratio: 13 (ref 12–28)
BUN: 17 mg/dL (ref 8–27)
CO2: 18 mmol/L — ABNORMAL LOW (ref 20–29)
Calcium: 8.9 mg/dL (ref 8.7–10.3)
Chloride: 106 mmol/L (ref 96–106)
Creatinine, Ser: 1.27 mg/dL — ABNORMAL HIGH (ref 0.57–1.00)
Glucose: 92 mg/dL (ref 70–99)
Potassium: 4.6 mmol/L (ref 3.5–5.2)
Sodium: 139 mmol/L (ref 134–144)
eGFR: 45 mL/min/1.73 — ABNORMAL LOW (ref >59)

## 2024-10-15 LAB — IRON,TIBC AND FERRITIN PANEL
Ferritin: 92 ng/mL (ref 15–150)
Iron Saturation: 15 % (ref 15–55)
Iron: 44 ug/dL (ref 27–139)
Total Iron Binding Capacity: 285 ug/dL (ref 250–450)
UIBC: 241 ug/dL (ref 118–369)

## 2024-10-16 ENCOUNTER — Telehealth: Admitting: Physician Assistant

## 2024-10-16 ENCOUNTER — Ambulatory Visit: Payer: Self-pay

## 2024-10-16 ENCOUNTER — Encounter (HOSPITAL_BASED_OUTPATIENT_CLINIC_OR_DEPARTMENT_OTHER): Payer: Self-pay

## 2024-10-16 DIAGNOSIS — R052 Subacute cough: Secondary | ICD-10-CM

## 2024-10-16 DIAGNOSIS — R0602 Shortness of breath: Secondary | ICD-10-CM

## 2024-10-16 NOTE — Patient Instructions (Signed)
 Denise [name], thank you for joining Teena Shuck, PA-C for today's virtual visit.  While this provider is not your primary care provider (PCP), if your PCP is located in our provider database this encounter information will be shared with them immediately following your visit.   A [facility] MyChart account gives you access to today's visit and all your visits, tests, and labs performed at [facility]  click here if you don't have a [facility] MyChart account or go to [email or web address]  Consent: (Patient) Denise [name] provided verbal consent for this virtual visit at the beginning of the encounter.  Current Medications:  Current Outpatient Medications:    alendronate  (FOSAMAX ) 70 MG tablet, Take 1 tablet (70 mg total) by mouth every 7 (seven) days. Take with a full glass of water on an empty stomach. Avoid laying flat for 2 hours., Disp: 12 tablet, Rfl: 1   aspirin  81 MG tablet, Take 81 mg by mouth daily., Disp: , Rfl:    atorvastatin  (LIPITOR) 10 MG tablet, Take 1 tablet (10 mg total) by mouth daily. for cholesterol., Disp: 90 tablet, Rfl: 1   Blood Glucose Monitoring Suppl (GHT BLOOD GLUCOSE MONITOR) w/Device KIT, 1 EACH BY DOES NOT APPLY ROUTE DAILY. MAY SUBSTITUTE TO ANY MANUFACTURER COVERED BY PATIENT'S INSURANCE., Disp: 1 kit, Rfl: 0   buPROPion  ER (WELLBUTRIN  SR) 100 MG 12 hr tablet, Take 1 tablet (100 mg total) by mouth 2 (two) times daily for depression, Disp: 180 tablet, Rfl: 1   DULoxetine  (CYMBALTA ) 60 MG capsule, TAKE 1 CAPSULE BY MOUTH EVERY DAY TO PREVENT HEADACHE, Disp: 90 capsule, Rfl: 1   empagliflozin  (JARDIANCE ) 25 MG TABS tablet, Take 1 tablet (25 mg total) by mouth daily before breakfast., Disp: 90 tablet, Rfl: 3   guaiFENesin (MUCINEX) 600 MG 12 hr tablet, Take by mouth 2 (two) times daily as needed for cough., Disp: , Rfl:    Lancets Misc. MISC, 1 each by Does not apply route daily. May substitute to any manufacturer covered by  patient's insurance., Disp: 100 each, Rfl: 3   Lancets MISC, 1 EACH BY DOES NOT APPLY ROUTE IN THE MORNING, AT NOON, AND AT BEDTIME. MAY SUBSTITUTE TO ANY MANUFACTURER COVERED BY PATIENT'S INSURANCE., Disp: 1 each, Rfl: 0   lansoprazole (EQ LANSOPRAZOLE) 15 MG capsule, Take 15 mg by mouth 2 (two) times daily., Disp: , Rfl:    levothyroxine  (SYNTHROID ) 75 MCG tablet, Take 1 tablet (75 mcg total) by mouth in the morning on an empty stomach with water only. No food/other medications for 1/2 hour, Disp: 90 tablet, Rfl: 1   magnesium  30 MG tablet, Take 30 mg by mouth daily., Disp: , Rfl:    meloxicam (MOBIC) 7.5 MG tablet, Take 7.5 mg by mouth daily. (Patient taking differently: Take 7.5 mg by mouth daily. Taking as needed right now), Disp: , Rfl:    methocarbamol (ROBAXIN) 500 MG tablet, Take 500 mg by mouth every 6 (six) hours as needed for muscle spasms., Disp: , Rfl:    metoprolol  succinate (TOPROL -XL) 50 MG 24 hr tablet, Take 0.5 tablets (25 mg total) by mouth daily. Take with or immediately following a meal., Disp: , Rfl:    metoprolol  tartrate (LOPRESSOR ) 25 MG tablet, Take one (1) tablet by mouth ( 25 mg) Heart Rate greater than 100 or palpitations., Disp: 30 tablet, Rfl: 6   mirtazapine  (REMERON ) 30 MG tablet, TAKE 1 TABLET (30 MG TOTAL) BY MOUTH AT BEDTIME FOR SLEEP., Disp: 90 tablet, Rfl: 1  Multiple Minerals-Vitamins (CALCIUM -MAGNESIUM -ZINC-D3 PO), Take 1 capsule by mouth daily., Disp: , Rfl:    nitroGLYCERIN  (NITROSTAT ) 0.4 MG SL tablet, Dissolve 1 tablet under the tongue every 5 minutes as needed for chest pain. Max of 3 doses, then 911., Disp: 25 tablet, Rfl: 6   nystatin  cream (MYCOSTATIN ), APPLY TO AFFECTED AREA TWICE A DAY AS NEEDED FOR DRY SKIN, Disp: 30 g, Rfl: 0   ONETOUCH VERIO test strip, 1 EACH BY IN VITRO ROUTE DAILY. MAY SUBSTITUTE TO ANY MANUFACTURER COVERED BY PATIENT'S INSURANCE., Disp: 100 strip, Rfl: 3   sacubitril -valsartan  (ENTRESTO ) 97-103 MG, Take 1/2 tablet by mouth  twice a day, Disp: , Rfl:    torsemide  (DEMADEX ) 20 MG tablet, Take 0.5 tablets (10 mg total) by mouth daily., Disp: , Rfl:    Medications ordered in this encounter:  No orders of the defined types were placed in this encounter.    *If you need refills on other medications prior to your next appointment, please contact your pharmacy*  Follow-Up: Call back or seek an in-person evaluation if the symptoms worsen or if the condition fails to improve as anticipated.  [facility] [phone]  Other Instructions Report to Urgent Care or ER for evaluation.   If you have been instructed to have an in-person evaluation today at a local Urgent Care facility, please use the link below. It will take you to a list of all of our available [facility], including address, phone number and hours of operation. Please do not delay care.  [facility]  If you or a family member do not have a primary care provider, use the link below to schedule a visit and establish care. When you choose a [facility] primary care physician or advanced practice provider, you gain a long-term partner in health. Find a Primary Care Provider  Learn more about [facility] in-office and virtual care options: [facility] - Get Care Now

## 2024-10-16 NOTE — Telephone Encounter (Signed)
 FYI Only or Action Required?: Action required by provider: request for appointment.  Patient was last seen in primary care on 07/07/2024 by Gretta Comer POUR, NP.  Called Nurse Triage reporting Cough.  Symptoms began several weeks ago.  Interventions attempted: Rest, hydration, or home remedies.  Symptoms are: unchanged. Had flu 2 weeks ago. Still has cough and congestion with green mucus  Triage Disposition: See Physician Within 24 Hours  Patient/caregiver understands and will follow disposition?: Yes    Copied from CRM #[id number]. Topic: Clinical - Red Word Triage >> Oct 16, 2024 11:47 AM Alfonso ORN wrote: Red Word that prompted transfer to Nurse Triage: alot of congestion in chest, mucous after flu last week getting worse . previous heart condition Reason for Disposition  [1] Continuous (nonstop) coughing interferes with work or school AND [2] no improvement using cough treatment per Care Advice  Answer Assessment - Initial Assessment Questions 1. ONSET: When did the cough begin?      2 weeks 2. SEVERITY: How bad is the cough today?      severe 3. SPUTUM: Describe the color of your sputum (e.g., none, dry cough; clear, white, yellow, green)     Green, thick 4. HEMOPTYSIS: Are you coughing up any blood? If Yes, ask: How much? (e.g., flecks, streaks, tablespoons, etc.)     no 5. DIFFICULTY BREATHING: Are you having difficulty breathing? If Yes, ask: How bad is it? (e.g., mild, moderate, severe)      mild 6. FEVER: Do you have a fever? If Yes, ask: What is your temperature, how was it measured, and when did it start?     no 7. CARDIAC HISTORY: Do you have any history of heart disease? (e.g., heart attack, congestive heart failure)      yes 8. LUNG HISTORY: Do you have any history of lung disease?  (e.g., pulmonary embolus, asthma, emphysema)     no 9. PE RISK FACTORS: Do you have a history of blood clots? (or: recent major surgery, recent prolonged travel,  bedridden)     no 10. OTHER SYMPTOMS: Do you have any other symptoms? (e.g., runny nose, wheezing, chest pain)       wheezing 11. PREGNANCY: Is there any chance you are pregnant? When was your last menstrual period?       no 12. TRAVEL: Have you traveled out of the country in the last month? (e.g., travel history, exposures)       no  Protocols used: Cough - Acute Productive-A-AH

## 2024-10-16 NOTE — Progress Notes (Signed)
 Virtual Visit Consent   Denise [name], you are scheduled for a virtual visit with a Agency provider today. Just as with appointments in the office, your consent must be obtained to participate. Your consent will be active for this visit and any virtual visit you may have with one of our providers in the next 365 days. If you have a MyChart account, a copy of this consent can be sent to you electronically.  As this is a virtual visit, video technology does not allow for your provider to perform a traditional examination. This may limit your provider's ability to fully assess your condition. If your provider identifies any concerns that need to be evaluated in person or the need to arrange testing (such as labs, EKG, etc.), we will make arrangements to do so. Although advances in technology are sophisticated, we cannot ensure that it will always work on either your end or our end. If the connection with a video visit is poor, the visit may have to be switched to a telephone visit. With either a video or telephone visit, we are not always able to ensure that we have a secure connection.  By engaging in this virtual visit, you consent to the provision of healthcare and authorize for your insurance to be billed (if applicable) for the services provided during this visit. Depending on your insurance coverage, you may receive a charge related to this service.  I need to obtain your verbal consent now. Are you willing to proceed with your visit today? Denise [name] has provided verbal consent on 10/16/2024 for a virtual visit (video or telephone). Teena Shuck, [place]  Date: 10/16/2024 3:10 PM   Virtual Visit via Video Note   I, Teena Shuck, connected with  Denise [name]  ([id number], 73/14/1952) on 10/16/24 at  3:00 PM EDT by a video-enabled telemedicine application and verified that I am speaking with the correct person using two identifiers.  Location: Patient: Virtual Visit  Location Patient: Home Provider: Virtual Visit Location Provider: Home Office   I discussed the limitations of evaluation and management by telemedicine and the availability of in person appointments. The patient expressed understanding and agreed to proceed.    History of Present Illness: Denise [name] is a 73 y.o. who identifies as a female who was assigned female at birth, and is being seen today for ongoing congestion and cough. .  HPI: Cough This is a recurrent problem. The current episode started 1 to 4 weeks ago. The problem has been gradually worsening. Associated symptoms include shortness of breath. Nothing aggravates the symptoms. The treatment provided no relief.    Problems:  Patient Active Problem List   Diagnosis Date Noted   Facial cellulitis 07/07/2024   Dizziness 04/09/2024   Rash and nonspecific skin eruption 04/09/2024   Urinary frequency 02/21/2024   Type 2 diabetes mellitus with hyperglycemia, without long-term current use of insulin (HCC) 02/21/2024   Tachycardia 01/01/2024   Left upper quadrant abdominal pain 07/31/2023   Class 3 severe obesity due to excess calories with serious comorbidity and body mass index (BMI) of 40.0 to 44.9 in adult [facility]) 05/16/2023   Chronic diastolic CHF (congestive heart failure) (HCC) 10/08/2022   Iron deficiency anemia 07/28/2022   Dysphagia 07/28/2022   LBBB (left bundle branch block) 06/12/2022   Shortness of breath 06/12/2022   Bilateral leg edema 06/12/2022   Overactive bladder 09/26/2021   Neck pain on right side 07/31/2021   Encounter for annual general medical  examination with abnormal findings in adult 06/23/2021   Sleep disturbance 06/01/2021   Anxiety and depression 11/02/2020   Lumbosacral spondylosis with radiculopathy 10/20/2020   Elevated alkaline phosphatase level 02/11/2020   Hx of adenomatous colonic polyps 01/21/2020   Left lower quadrant abdominal pain 10/22/2019   Excessive sweating 10/13/2019    Prediabetes 10/13/2019   Hyperlipidemia 02/02/2019   Welcome to Medicare preventive visit 08/25/2015   Essential hypertension 08/25/2015   Pituitary adenoma (HCC) 05/12/2014   GERD (gastroesophageal reflux disease) 09/01/2012   Hypothyroidism    Osteoporosis    Migraine    HSV-1 (herpes simplex virus 1) infection     Allergies:  Allergies  Allergen Reactions   Tramadol Other (See Comments)    Reaction: NIGHTMARES Other reaction(s): Not available   Medications:  Current Outpatient Medications:    alendronate  (FOSAMAX ) 70 MG tablet, Take 1 tablet (70 mg total) by mouth every 7 (seven) days. Take with a full glass of water on an empty stomach. Avoid laying flat for 2 hours., Disp: 12 tablet, Rfl: 1   aspirin  81 MG tablet, Take 81 mg by mouth daily., Disp: , Rfl:    atorvastatin  (LIPITOR) 10 MG tablet, Take 1 tablet (10 mg total) by mouth daily. for cholesterol., Disp: 90 tablet, Rfl: 1   Blood Glucose Monitoring Suppl (GHT BLOOD GLUCOSE MONITOR) w/Device KIT, 1 EACH BY DOES NOT APPLY ROUTE DAILY. MAY SUBSTITUTE TO ANY MANUFACTURER COVERED BY PATIENT'S INSURANCE., Disp: 1 kit, Rfl: 0   buPROPion  ER (WELLBUTRIN  SR) 100 MG 12 hr tablet, Take 1 tablet (100 mg total) by mouth 2 (two) times daily for depression, Disp: 180 tablet, Rfl: 1   DULoxetine  (CYMBALTA ) 60 MG capsule, TAKE 1 CAPSULE BY MOUTH EVERY DAY TO PREVENT HEADACHE, Disp: 90 capsule, Rfl: 1   empagliflozin  (JARDIANCE ) 25 MG TABS tablet, Take 1 tablet (25 mg total) by mouth daily before breakfast., Disp: 90 tablet, Rfl: 3   guaiFENesin (MUCINEX) 600 MG 12 hr tablet, Take by mouth 2 (two) times daily as needed for cough., Disp: , Rfl:    Lancets Misc. MISC, 1 each by Does not apply route daily. May substitute to any manufacturer covered by patient's insurance., Disp: 100 each, Rfl: 3   Lancets MISC, 1 EACH BY DOES NOT APPLY ROUTE IN THE MORNING, AT NOON, AND AT BEDTIME. MAY SUBSTITUTE TO ANY MANUFACTURER COVERED BY PATIENT'S  INSURANCE., Disp: 1 each, Rfl: 0   lansoprazole (EQ LANSOPRAZOLE) 15 MG capsule, Take 15 mg by mouth 2 (two) times daily., Disp: , Rfl:    levothyroxine  (SYNTHROID ) 75 MCG tablet, Take 1 tablet (75 mcg total) by mouth in the morning on an empty stomach with water only. No food/other medications for 1/2 hour, Disp: 90 tablet, Rfl: 1   magnesium  30 MG tablet, Take 30 mg by mouth daily., Disp: , Rfl:    meloxicam (MOBIC) 7.5 MG tablet, Take 7.5 mg by mouth daily. (Patient taking differently: Take 7.5 mg by mouth daily. Taking as needed right now), Disp: , Rfl:    methocarbamol (ROBAXIN) 500 MG tablet, Take 500 mg by mouth every 6 (six) hours as needed for muscle spasms., Disp: , Rfl:    metoprolol  succinate (TOPROL -XL) 50 MG 24 hr tablet, Take 0.5 tablets (25 mg total) by mouth daily. Take with or immediately following a meal., Disp: , Rfl:    metoprolol  tartrate (LOPRESSOR ) 25 MG tablet, Take one (1) tablet by mouth ( 25 mg) Heart Rate greater than 100 or palpitations., Disp:  30 tablet, Rfl: 6   mirtazapine  (REMERON ) 30 MG tablet, TAKE 1 TABLET (30 MG TOTAL) BY MOUTH AT BEDTIME FOR SLEEP., Disp: 90 tablet, Rfl: 1   Multiple Minerals-Vitamins (CALCIUM -MAGNESIUM -ZINC-D3 PO), Take 1 capsule by mouth daily., Disp: , Rfl:    nitroGLYCERIN  (NITROSTAT ) 0.4 MG SL tablet, Dissolve 1 tablet under the tongue every 5 minutes as needed for chest pain. Max of 3 doses, then 911., Disp: 25 tablet, Rfl: 6   nystatin  cream (MYCOSTATIN ), APPLY TO AFFECTED AREA TWICE A DAY AS NEEDED FOR DRY SKIN, Disp: 30 g, Rfl: 0   ONETOUCH VERIO test strip, 1 EACH BY IN VITRO ROUTE DAILY. MAY SUBSTITUTE TO ANY MANUFACTURER COVERED BY PATIENT'S INSURANCE., Disp: 100 strip, Rfl: 3   sacubitril -valsartan  (ENTRESTO ) 97-103 MG, Take 1/2 tablet by mouth twice a day, Disp: , Rfl:    torsemide  (DEMADEX ) 20 MG tablet, Take 0.5 tablets (10 mg total) by mouth daily., Disp: , Rfl:   Observations/Objective: Patient is well-developed,  well-nourished in no acute distress.  Resting comfortably  at home.  Head is normocephalic, atraumatic.  No labored breathing.  Speech is clear and coherent with logical content.  Patient is alert and oriented at baseline.    Assessment and Plan: 1. Subacute cough (Primary)  2. Shortness of breath  Patient with ongoing cough and shortness of breath advised to be seen in person and patient agreed to report to Urgent care for evaluation.   Follow Up Instructions: I discussed the assessment and treatment plan with the patient. The patient was provided an opportunity to ask questions and all were answered. The patient agreed with the plan and demonstrated an understanding of the instructions.  A copy of instructions were sent to the patient via MyChart unless otherwise noted below.   The patient was advised to call back or seek an in-person evaluation if the symptoms worsen or if the condition fails to improve as anticipated.    Teena Shuck, PA-C

## 2024-10-19 ENCOUNTER — Ambulatory Visit
Admission: RE | Admit: 2024-10-19 | Discharge: 2024-10-19 | Disposition: A | Discharge status: Home / Self Care | Source: Ambulatory Visit | Attending: Family Medicine | Admitting: Family Medicine

## 2024-10-19 ENCOUNTER — Ambulatory Visit (INDEPENDENT_AMBULATORY_CARE_PROVIDER_SITE_OTHER): Admitting: Family Medicine

## 2024-10-19 VITALS — BP 106/77 | HR 100 | Temp 97.9°F | Resp 16

## 2024-10-19 DIAGNOSIS — R051 Acute cough: Secondary | ICD-10-CM

## 2024-10-19 DIAGNOSIS — Z0389 Encounter for observation for other suspected diseases and conditions ruled out: Secondary | ICD-10-CM | POA: Diagnosis not present

## 2024-10-19 DIAGNOSIS — J22 Unspecified acute lower respiratory infection: Secondary | ICD-10-CM | POA: Diagnosis not present

## 2024-10-19 MED ORDER — AMOXICILLIN-POT CLAVULANATE 875-125 MG PO TABS: 1.0000 | ORAL_TABLET | Freq: Two times a day (BID) | ORAL | 0 refills | Status: DC

## 2024-10-19 MED ORDER — PREDNISONE 10 MG PO TABS: ORAL_TABLET | ORAL | 0 refills | Status: DC

## 2024-10-19 NOTE — Progress Notes (Signed)
 Acute Office Visit  Introduced to nurse practitioner role and practice setting.  All questions answered.  Discussed provider/patient relationship and expectations.   Subjective:     Patient ID: Denise [name], female    DOB: 12-08-1951, 73 y.o.   MRN: [id number]  Chief Complaint  Patient presents with   Acute Visit    Cough( since the 10/20) Chest congestion   Discussed the use of AI scribe software for clinical note transcription with the patient, who gave verbal consent to proceed.  History of Present Illness Denise [name] is a 73 year old female with significant cardiology history, heart who presents with a persistent cough following a recent flu- like infection. Was not formally tested  She has been experiencing a persistent cough that began after a flu infection on October 05, 2024. The flu symptoms lasted for about five to seven days, with a low-grade fever that resolved after three to four days. The cough is deep and mucousy, with states rattling sound in her chest.  She is taking Mucinex 1200 mg daily, Delsym, and Tylenol , though she has not taken Tylenol  since her fever subsided.   She has a history of heart failure and takes torsemide  for fluid retention. She experiences difficulty breathing when lying down, which affects her sleep, causing her to sleep sitting up. No recent chest pain, weight gain, or significant changes in urination. Denies nausea, vomiting, rash, diarrhea.No fevers  During the review of symptoms, she reports a little wheezing, no recent fever or chills, and no current vomiting or diarrhea. She has not had any recent imaging studies.  She received the pneumonia vaccine approximately two years ago.   HPI  ROS      Objective:    BP 106/77 (BP Location: Left Arm, Patient Position: Sitting, Cuff Size: Large)   Pulse 100   Temp 97.9 F (36.6 C) (Oral)   Resp 16   SpO2 100%    Physical Exam Constitutional:      General: She is not in  acute distress.    Appearance: She is well-developed and normal weight. She is not ill-appearing, toxic-appearing or diaphoretic.  HENT:     Head: Normocephalic.     Right Ear: Tympanic membrane and ear canal normal. No drainage, swelling or tenderness. No middle ear effusion. Tympanic membrane is not erythematous.     Left Ear: Tympanic membrane and ear canal normal. No drainage, swelling or tenderness.  No middle ear effusion. Tympanic membrane is not erythematous.     Nose: Congestion present. No rhinorrhea.     Right Turbinates: Not enlarged.     Left Turbinates: Not enlarged.     Right Sinus: Maxillary sinus tenderness present. No frontal sinus tenderness.     Left Sinus: Maxillary sinus tenderness present. No frontal sinus tenderness.     Mouth/Throat:     Lips: Pink.     Mouth: Mucous membranes are moist. No oral lesions.     Pharynx: Uvula midline. Posterior oropharyngeal erythema present. No pharyngeal swelling, oropharyngeal exudate or uvula swelling.     Tonsils: No tonsillar exudate or tonsillar abscesses. 0 on the right. 0 on the left.  Eyes:     Extraocular Movements:     Right eye: Normal extraocular motion.     Left eye: Normal extraocular motion.     Conjunctiva/sclera: Conjunctivae normal.     Pupils: Pupils are equal, round, and reactive to light.  Cardiovascular:     Rate and Rhythm: Regular rhythm.  Tachycardia present.     Heart sounds: No murmur heard.    No gallop.     Comments: Low 100s HR Pulmonary:     Effort: Pulmonary effort is normal. No respiratory distress.     Breath sounds: No stridor. Wheezing and rhonchi present. No rales.     Comments: Cough present. Rhonchi does not clear with cough. Mildly tachypnea during exam Chest:     Chest wall: No tenderness.  Musculoskeletal:        General: Normal range of motion.     Right lower leg: No edema.     Left lower leg: No edema.  Lymphadenopathy:     Cervical: No cervical adenopathy.  Skin:    General:  Skin is warm and dry.     Capillary Refill: Capillary refill takes less than 2 seconds.  Neurological:     Mental Status: She is alert.  Psychiatric:        Mood and Affect: Mood normal.        Behavior: Behavior normal.        Thought Content: Thought content normal.        Judgment: Judgment normal.     No results found for any visits on 10/19/24.      Assessment & Plan:  Assessment and Plan Assessment & Plan Acute cough -  possible lower respiratory infection - Pneumonia vs bronchitis vs sinusitis Persistent cough for two weeks following a flu-like illness, with coarser breath sounds and wheezing. No recent imaging. No fever or chills since initial flu symptoms. No recent use of inhalers. No recent antibiotic use.  - Course breath sounds, mild tachypnea on exam. - Rhonchi does not clear with cough - concerns for developing pneumonia given physical exam today and co morbidities.  - Ordered chest x-ray to evaluate for pneumonia or other lower respiratory infection. - Prescribed Augmentin  875 mg twice daily for 7 days. - Prescribed a 6-day course of prednisone  to reduce inflammation. - Continue Mucinex and Delsym for symptom relief. - Advised on supportive measures: hot tea with honey, salt water gargles, cough lozenges - hold on albuterol  given use of metoprolol  - Instructed to seek urgent care or ED if symptoms worsen or do not improve within 48 hours. - pt is vaccinated for pneumoncoccal, has not received flu vaccine this season due to recent illnesses  Diastolic Heart failure - chronic - no recent weight gain - on torsemide , jardiance , metoprolol , entresto  - lungs are course, no edema in BLE - mild tachypnea and tachycardiac (low 100s)with exam, appears to run 80-100s per chart review - no DOB or SOB or worsening fatigue during today's visit - Saw cardiology last week - chest xray to rule out any cardiac or lung changes.    Problem List Items Addressed This Visit    None Visit Diagnoses       Acute cough    -  Primary   Relevant Medications   predniSONE  (DELTASONE ) 10 MG tablet   Other Relevant Orders   DG Chest 2 View     Acute lower respiratory infection       Relevant Medications   amoxicillin -clavulanate (AUGMENTIN ) 875-125 MG tablet   predniSONE  (DELTASONE ) 10 MG tablet       Meds ordered this encounter  Medications   amoxicillin -clavulanate (AUGMENTIN ) 875-125 MG tablet    Sig: Take 1 tablet by mouth 2 (two) times daily.    Dispense:  14 tablet    Refill:  0   predniSONE  (  DELTASONE ) 10 MG tablet    Sig: Take 60mg  PO daily x1d, then 50mg  daily x1d, then 40mg  daily x1d, then 30mg  daily x1d, then 20mg  daily x1d, then 10mg  daily x1d, then stop    Dispense:  21 tablet    Refill:  0    No follow-ups on file.  Curtis DELENA Boom, FNP  I, Curtis DELENA Boom, FNP, have reviewed all documentation for this visit. The documentation on 10/19/24 for the exam, diagnosis, procedures, and orders are all accurate and complete.

## 2024-10-20 LAB — COMPREHENSIVE METABOLIC PANEL WITH GFR
AG Ratio: 1.6 (calc) (ref 1.0–2.5)
ALT: 12 U/L (ref 6–29)
AST: 13 U/L (ref 10–35)
Albumin: 4.2 g/dL (ref 3.6–5.1)
Alkaline phosphatase (APISO): 159 U/L — ABNORMAL HIGH (ref 37–153)
BUN/Creatinine Ratio: 22 (calc) (ref 6–22)
BUN: 30 mg/dL — ABNORMAL HIGH (ref 7–25)
CO2: 27 mmol/L (ref 20–32)
Calcium: 9.8 mg/dL (ref 8.6–10.4)
Chloride: 102 mmol/L (ref 98–110)
Creat: 1.39 mg/dL — ABNORMAL HIGH (ref 0.60–1.00)
Globulin: 2.7 g/dL (ref 1.9–3.7)
Glucose, Bld: 100 mg/dL (ref 65–139)
Potassium: 5.5 mmol/L — ABNORMAL HIGH (ref 3.5–5.3)
Sodium: 139 mmol/L (ref 135–146)
Total Bilirubin: 0.4 mg/dL (ref 0.2–1.2)
Total Protein: 6.9 g/dL (ref 6.1–8.1)
eGFR: 40 mL/min/1.73m2 — ABNORMAL LOW (ref >=60)

## 2024-10-20 LAB — ALKALINE PHOSPHATASE, BONE SPECIFIC: ALKALINE PHOSPHATASE, BONE SPECIFIC: 20.3 ug/L (ref 5.6–29.0)

## 2024-10-20 LAB — GAMMA GT: GGT: 25 U/L (ref 3–65)

## 2024-10-20 LAB — MAGNESIUM: Magnesium: 2.2 mg/dL (ref 1.5–2.5)

## 2024-10-20 LAB — PHOSPHORUS: Phosphorus: 4 mg/dL (ref 2.1–4.3)

## 2024-10-20 LAB — VITAMIN D 25 HYDROXY (VIT D DEFICIENCY, FRACTURES): Vit D, 25-Hydroxy: 32 ng/mL (ref 30–100)

## 2024-10-20 LAB — PARATHYROID HORMONE, INTACT (NO CA): PTH: 70 pg/mL (ref 16–77)

## 2024-10-21 ENCOUNTER — Ambulatory Visit: Payer: Self-pay | Admitting: Family Medicine

## 2024-10-21 NOTE — Telephone Encounter (Signed)
 Patient called and given lab results per notes. She says she is wanting to know about her chest x-ray results. Advised it has not been been reviewed by the provider, so I will send a message. She asked will she receive them this week because she wants to know if she's cleared of pneumonia. Advised someone should reach back out to her this week. She verbalized understanding.   Copied from CRM [id number]. Topic: Clinical - Lab/Test Results >> Oct 21, 2024  4:08 PM Nessti S wrote: Reason for CRM: pt called to receive lab results. Call back number [phone]

## 2024-10-27 ENCOUNTER — Other Ambulatory Visit: Payer: Self-pay | Admitting: Primary Care

## 2024-10-27 ENCOUNTER — Other Ambulatory Visit (HOSPITAL_COMMUNITY): Payer: Self-pay

## 2024-10-27 DIAGNOSIS — E785 Hyperlipidemia, unspecified: Secondary | ICD-10-CM

## 2024-10-27 DIAGNOSIS — F32A Depression, unspecified: Secondary | ICD-10-CM

## 2024-10-27 DIAGNOSIS — E039 Hypothyroidism, unspecified: Secondary | ICD-10-CM

## 2024-10-27 MED ORDER — LEVOTHYROXINE SODIUM 75 MCG PO TABS
75.0000 ug | ORAL_TABLET | Freq: Every morning | ORAL | 0 refills | Status: DC
  Filled 2024-10-27: qty 90, 90d supply, fill #0

## 2024-10-27 MED ORDER — ATORVASTATIN CALCIUM 10 MG PO TABS
10.0000 mg | ORAL_TABLET | Freq: Every day | ORAL | 0 refills | Status: AC
  Filled 2024-10-27: qty 90, 90d supply, fill #0

## 2024-10-27 MED ORDER — BUPROPION HCL ER (SR) 100 MG PO TB12
100.0000 mg | ORAL_TABLET | Freq: Two times a day (BID) | ORAL | 0 refills | Status: AC
  Filled 2024-10-27: qty 180, 90d supply, fill #0

## 2024-10-27 NOTE — Telephone Encounter (Signed)
 Patient is due for CPE/follow up after 12/31/24, this will be required prior to any further refills.  Please schedule, thank you!

## 2024-10-30 ENCOUNTER — Ambulatory Visit: Payer: Self-pay

## 2024-10-30 NOTE — Telephone Encounter (Signed)
 Noted, will evaluate.

## 2024-10-30 NOTE — Telephone Encounter (Signed)
 FYI Only or Action Required?: FYI only for provider: appointment scheduled on 11/03/2024 at 2:40 PM.  Patient was last seen in primary care on 10/19/2024 by Wellington Curtis LABOR, FNP.  Called Nurse Triage reporting Abdominal Pain.  Symptoms began 2 months ago but will increase at times.  Interventions attempted: Rest, hydration, or home remedies.  Symptoms are: unchanged.  Triage Disposition: See Within 3 Days in Office (overriding See Physician Within 24 Hours)  Patient/caregiver understands and will follow disposition?: Yes  Copied from CRM #[id number]. Topic: Clinical - Red Word Triage >> Oct 30, 2024 12:15 PM Alexandria E wrote: [facility] that prompted transfer to Nurse Triage: Lower left pain, below stomach. Going on for a couple of months, but worsening. Reason for Disposition  [1] MILD pain (e.g., does not interfere with normal activities) AND [2] pain comes and goes (cramps) AND [3] present > 48 hours  (Exception: This same abdominal pain is a chronic symptom recurrent or ongoing AND present > 4 weeks.)  Answer Assessment - Initial Assessment Questions Patient reports LLQ pain that started 2 months ago. Patient reports pain will come and go but has been getting worse at times. Current pain level of 4 out of 10. Patient requested an appointment on 11/18 due to her availability. Scheduled patient for 11/03/2024 at 2:40 PM  1. LOCATION: Where does it hurt?      Lower left quadrant area 2. RADIATION: Does the pain shoot anywhere else? (e.g., chest, back)     no 3. ONSET: When did the pain begin? (e.g., minutes, hours or days ago)      2 months ago 4. SUDDEN: Gradual or sudden onset?     gradual 5. PATTERN Does the pain come and go, or is it constant?     Comes and goes 6. SEVERITY: How bad is the pain?  (e.g., Scale 1-10; mild, moderate, or severe)     4 7. RECURRENT SYMPTOM: Have you ever had this type of stomach pain before? If Yes, ask: When was the last time? and  What happened that time?      yes 8. CAUSE: What do you think is causing the stomach pain? (e.g., gallstones, recent abdominal surgery)     unsure 9. RELIEVING/AGGRAVATING FACTORS: What makes it better or worse? (e.g., antacids, bending or twisting motion, bowel movement)     Heat seems to help 10. OTHER SYMPTOMS: Do you have any other symptoms? (e.g., back pain, diarrhea, fever, urination pain, vomiting)       no  Protocols used: Abdominal Pain - Female-A-AH

## 2024-10-30 NOTE — Telephone Encounter (Signed)
 Pt is scheduled for 11/18/20025

## 2024-10-31 DIAGNOSIS — M5416 Radiculopathy, lumbar region: Secondary | ICD-10-CM | POA: Diagnosis not present

## 2024-11-02 ENCOUNTER — Ambulatory Visit: Admitting: Endocrinology

## 2024-11-03 ENCOUNTER — Ambulatory Visit

## 2024-11-05 NOTE — Telephone Encounter (Signed)
 lvm for pt to call office to schedule appt.

## 2024-11-10 DIAGNOSIS — Z961 Presence of intraocular lens: Secondary | ICD-10-CM | POA: Diagnosis not present

## 2024-11-10 DIAGNOSIS — H25811 Combined forms of age-related cataract, right eye: Secondary | ICD-10-CM | POA: Diagnosis not present

## 2024-11-10 DIAGNOSIS — H43813 Vitreous degeneration, bilateral: Secondary | ICD-10-CM | POA: Diagnosis not present

## 2024-11-10 DIAGNOSIS — H18513 Endothelial corneal dystrophy, bilateral: Secondary | ICD-10-CM | POA: Diagnosis not present

## 2024-11-11 NOTE — Telephone Encounter (Signed)
 Lvm to schedule

## 2024-11-17 DIAGNOSIS — M542 Cervicalgia: Secondary | ICD-10-CM | POA: Diagnosis not present

## 2024-11-17 DIAGNOSIS — M545 Low back pain, unspecified: Secondary | ICD-10-CM | POA: Diagnosis not present

## 2024-11-18 NOTE — Telephone Encounter (Signed)
 Patient was seen by Rosaline RAMAN NP 10/14/2024

## 2024-11-19 ENCOUNTER — Other Ambulatory Visit (HOSPITAL_BASED_OUTPATIENT_CLINIC_OR_DEPARTMENT_OTHER): Payer: Self-pay | Admitting: Nurse Practitioner

## 2024-11-21 ENCOUNTER — Other Ambulatory Visit: Payer: Self-pay | Admitting: Primary Care

## 2024-11-21 DIAGNOSIS — M816 Localized osteoporosis [Lequesne]: Secondary | ICD-10-CM

## 2024-11-22 NOTE — Telephone Encounter (Signed)
 Patient is due for CPE/follow up in late January 2026, this will be required prior to any further refills. Can we schedule her at the end of a morning or evening session? Please schedule, thank you!

## 2024-11-26 ENCOUNTER — Encounter (HOSPITAL_BASED_OUTPATIENT_CLINIC_OR_DEPARTMENT_OTHER): Payer: Self-pay

## 2024-11-26 ENCOUNTER — Other Ambulatory Visit: Payer: Self-pay | Admitting: Primary Care

## 2024-11-26 DIAGNOSIS — G479 Sleep disorder, unspecified: Secondary | ICD-10-CM

## 2024-11-26 MED ORDER — METOPROLOL SUCCINATE ER 50 MG PO TB24: 50.0000 mg | ORAL_TABLET | Freq: Every day | ORAL | 1 refills | Status: AC

## 2024-11-26 NOTE — Telephone Encounter (Signed)
 Patient is due for CPE/follow up in late January 2026, this will be required prior to any further refills.  Please schedule, thank you!

## 2024-11-29 ENCOUNTER — Encounter (HOSPITAL_BASED_OUTPATIENT_CLINIC_OR_DEPARTMENT_OTHER): Payer: Self-pay

## 2024-11-29 DIAGNOSIS — I209 Angina pectoris, unspecified: Secondary | ICD-10-CM

## 2024-11-30 ENCOUNTER — Encounter: Payer: Self-pay | Admitting: Pharmacist

## 2024-11-30 MED ORDER — NITROGLYCERIN 0.4 MG SL SUBL: SUBLINGUAL_TABLET | SUBLINGUAL | 6 refills | Status: AC

## 2024-11-30 NOTE — Progress Notes (Signed)
 Pharmacy Quality Measure Review  This patient is appearing on a report for being at risk of failing the adherence measure for cholesterol (statin) medications this calendar year.   Medication: atorvastatin  10 mg Last fill date: 07/30/24 for 90 day supply  Insurance report was not up to date. No action needed at this time.  Medication has been refilled as of 11/12 x90 ds.

## 2024-12-01 NOTE — Telephone Encounter (Signed)
 lvm for pt to call office to schedule appt.

## 2024-12-21 ENCOUNTER — Other Ambulatory Visit: Payer: Self-pay | Admitting: Primary Care

## 2024-12-21 DIAGNOSIS — G479 Sleep disorder, unspecified: Secondary | ICD-10-CM

## 2024-12-21 NOTE — Telephone Encounter (Signed)
 Patient is due for CPE/follow up in late January, this will be required prior to any further refills.  Please schedule, thank you!

## 2024-12-24 ENCOUNTER — Ambulatory Visit: Admitting: Endocrinology

## 2024-12-28 ENCOUNTER — Other Ambulatory Visit: Payer: Self-pay | Admitting: Primary Care

## 2024-12-28 DIAGNOSIS — G43009 Migraine without aura, not intractable, without status migrainosus: Secondary | ICD-10-CM

## 2024-12-29 NOTE — Telephone Encounter (Signed)
 Patient is due for CPE/follow up, this will be required prior to any further refills.  Please schedule, thank you!

## 2024-12-29 NOTE — Telephone Encounter (Signed)
 I called and LVM.

## 2025-01-06 ENCOUNTER — Ambulatory Visit: Payer: Self-pay | Admitting: Endocrinology

## 2025-01-06 ENCOUNTER — Ambulatory Visit: Admitting: Endocrinology

## 2025-01-06 ENCOUNTER — Encounter: Payer: Self-pay | Admitting: Endocrinology

## 2025-01-06 ENCOUNTER — Other Ambulatory Visit

## 2025-01-06 VITALS — BP 110/82 | HR 82 | Resp 16 | Ht 64.5 in | Wt 253.2 lb

## 2025-01-06 DIAGNOSIS — E782 Mixed hyperlipidemia: Secondary | ICD-10-CM

## 2025-01-06 DIAGNOSIS — R748 Abnormal levels of other serum enzymes: Secondary | ICD-10-CM

## 2025-01-06 DIAGNOSIS — E559 Vitamin D deficiency, unspecified: Secondary | ICD-10-CM | POA: Diagnosis not present

## 2025-01-06 DIAGNOSIS — Z9889 Other specified postprocedural states: Secondary | ICD-10-CM | POA: Diagnosis not present

## 2025-01-06 DIAGNOSIS — Z7984 Long term (current) use of oral hypoglycemic drugs: Secondary | ICD-10-CM | POA: Diagnosis not present

## 2025-01-06 DIAGNOSIS — E039 Hypothyroidism, unspecified: Secondary | ICD-10-CM

## 2025-01-06 DIAGNOSIS — E1165 Type 2 diabetes mellitus with hyperglycemia: Secondary | ICD-10-CM

## 2025-01-06 DIAGNOSIS — E890 Postprocedural hypothyroidism: Secondary | ICD-10-CM | POA: Diagnosis not present

## 2025-01-06 LAB — POCT GLYCOSYLATED HEMOGLOBIN (HGB A1C): Hemoglobin A1C: 6.3 % — AB (ref 4.0–5.6)

## 2025-01-06 NOTE — Progress Notes (Unsigned)
 "  Outpatient Endocrinology Note Denise Caraveo, MD   Patient's Name: Denise [name]    DOB: November 04, 1951    MRN: [id number]                                                    REASON OF VISIT: Follow-up for type 2 diabetes mellitus.  REFERRING PROVIDER: Gretta Comer POUR, NP  PCP: Gretta Comer POUR, NP  HISTORY OF PRESENT ILLNESS:   Denise [name] is a 74 y.o. old female with past medical history listed below, is here for follow-up for type 2 diabetes mellitus and elevated alkaline phosphatase.  She wants to follow-up for hypothyroidism in this clinic as well.  Pertinent Diabetes History: Patient was diagnosed with type 2 diabetes mellitus with hemoglobin A1c of 6.9% in January 2025.  She tried Mounjaro  low-dose p.o. times continue to have GI intolerance with nausea and vomiting and not able to continue on it.  She reports she was prediabetic diagnosed in 2020.  Hemoglobin A1c improved to 6.4% in April 2025.  No personal history of pancreatitis and / or family history of medullary thyroid  carcinoma or MEN 2B syndrome.   Chronic Diabetes Complications : Retinopathy: unknown. Last ophthalmology exam was done on ?, following with ophthalmology regularly.  Nephropathy: CKD III, on ACE/ARB / valsartan  Peripheral neuropathy: no Coronary artery disease: no Stroke: yes  Relevant comorbidities and cardiovascular risk factors: Obesity: yes Body mass index is 42.79 kg/m.  Hypertension: Yes  Hyperlipidemia : Yes, on statin   Current / Home Diabetic regimen includes:  Jardiance  25 mg daily.  Jardiance  was started by cardiology for having history of heart failure.  Prior diabetic medications: Mounjaro  tried initially stopped due to GI intolerance on low-dose.  Glycemic data:   Glucose log reviewed for last 10 days at different times of the day as follows, before meal and after meal.  125, 298, 129, 120, 155, 137, 146, 122  Hypoglycemia: Patient has no hypoglycemic episodes.  Patient has hypoglycemia awareness.  Factors modifying glucose control: 1.  Diabetic diet assessment: 3 meals a day.  2.  Staying active or exercising: No formal exercise, limited exercise due to low back pain.  She has been doing physical therapy.  3.  Medication compliance: compliant all of the time.  Vit D 500 Units daily, calcium   Left thyroidectomy in 2000s, thyroid  nodule.   # Postsurgical hypothyroidism : - Patient states history of having left thyroid  lobectomy in early 2000, has been on thyroid  hormone replacement since the surgery.  Thyroid  surgery was done for thyroid  nodules and reportedly benign.  She has been taking levothyroxine  75 mcg daily.   # Elevated alkaline phosphatase : - Patient is noted to have elevated alkaline phosphatase, based on chart review at least from 2013,  in the range of 145-220 range.  Alkaline phosphatase in January 2025 was 193 with upper normal limit of 117.  Normal liver enzymes including GGT. She has history of cholecystectomy in 2003, no other abdominal surgeries.  She has no fall and fracture.  She has low back pain due to degenerative changes/osteoarthritis, has been following with physical therapy.  -Labs in May 2025 : Alkaline phosphatase 159 mildly elevated, only specific alkaline phosphatase normal 20.3 and normal, GT.  Vitamin D  was normal and parathyroid  hormone normal.  # She has osteoporosis, DEXA scan  in May 2025 with lowest T-score of -2.5 at L2-L3, on Fosamax , managed by primary care provider.  Has been taking vitamin D  and calcium  supplement.   Interval Hx: Patient present for follow-up type 2 diabetes mellitus.  Patient was last time seen in May 2025.  Hemoglobin A1c 6.3%.  Patient has been taking Fosamax , stopped using at this time is being managed by PCP.  She has also been taking calcium  and vitamin D  supplement.  No fall and fracture.  She has been taking levothyroxine  75 mg daily and denies palpitation and heat  intolerance.  Glucose log reviewed as above.  No numbness and ting of the feet.  No vision problem.  No other complaints today.   REVIEW OF SYSTEMS As per history of present illness.   PAST MEDICAL HISTORY: Past Medical History:  Diagnosis Date   Acute non-recurrent sinusitis 06/12/2023   Allergy    seasonal   Anemia 2024   Resolved   Arthritis    Benzodiazepine intoxication (HCC)    Breast pain, left 04/18/2020   Bursitis of hip    Left   Cataract    Chest pain 02/25/2017   CHF (congestive heart failure) (HCC)    Colitis 01/26/2019   Constipation    90%of the time c/o constipation, occ has diarrhea - uses stool softener    Diverticulosis of colon without hemorrhage 01/21/2020   Endometriosis    Facial flushing 05/12/2014   Fibroid    GERD (gastroesophageal reflux disease)    Hematochezia    Hiatal hernia    HSV-1 (herpes simplex virus 1) infection    Hyperlipidemia    Hyperplastic colon polyp    Hypertension    Hypothyroidism    Ischemic colitis 01/2019   LBBB (left bundle branch block)    Low back pain    Migraine    Osteopenia 2008   -1.2 FEMORAL NECK   Osteoporosis 2024   Osteoaethritis-lower  back   Overdose of benzodiazepine 08/11/2021   Prediabetes    Stroke (HCC)    TIA   TIA (transient ischemic attack) 08/10/2013    PAST SURGICAL HISTORY: Past Surgical History:  Procedure Laterality Date   ABDOMINAL HYSTERECTOMY  12/18/1987   TAH, BSO   APPENDECTOMY  1982   BRAIN SURGERY  2003   Pituitary tumor   BREAST SURGERY  1992   Cyst   CHOLECYSTECTOMY  12/17/2001   COLONOSCOPY     EYE SURGERY  2024   Cataract left eye   KNEE SURGERY  12/17/2002   right   NASAL SEPTUM SURGERY  12/17/2010   PITUITARY SURGERY  12/18/1999   POLYPECTOMY     SHOULDER SURGERY  12/17/2005   right   THYROID  LOBECTOMY  12/17/2000    ALLERGIES: Allergies  Allergen Reactions   Tramadol Other (See Comments)    Reaction: NIGHTMARES Other reaction(s): Not available     FAMILY HISTORY:  Family History  Problem Relation Age of Onset   Cancer Mother        brain tumor   Varicose Veins Mother    Heart disease Father    Parkinson's disease Sister    Breast cancer Maternal Aunt    Heart disease Paternal Grandmother    Stroke Paternal Grandmother    Colon cancer Neg Hx    Colon polyps Neg Hx    Rectal cancer Neg Hx    Stomach cancer Neg Hx    Thyroid  disease Neg Hx    Esophageal cancer Neg Hx  Pancreatic cancer Neg Hx     SOCIAL HISTORY: Social History   Socioeconomic History   Marital status: Divorced    Spouse name: Not on file   Number of children: 2   Years of education: Not on file   Highest education level: 12th grade  Occupational History    Employer: RETIRED  Tobacco Use   Smoking status: Never    Passive exposure: Past   Smokeless tobacco: Never  Vaping Use   Vaping status: Never Used  Substance and Sexual Activity   Alcohol use: Yes    Alcohol/week: 0.0 standard drinks of alcohol    Comment: 1 every 3 months   Drug use: No   Sexual activity: Not Currently    Comment: intercourse age 63, more than 5 sexual partners,des neg  Other Topics Concern   Not on file  Social History Narrative   Single.   2 daughters, 4 grandchildren.   Once worked for [employer].   Enjoys catering manager.    Social Drivers of Health   Tobacco Use: Low Risk (01/06/2025)   Patient History    Smoking Tobacco Use: Never    Smokeless Tobacco Use: Never    Passive Exposure: Past  Financial Resource Strain: Patient Declined (10/19/2024)   Overall Financial Resource Strain (CARDIA)    Difficulty of Paying Living Expenses: Patient declined  Food Insecurity: Patient Declined (10/19/2024)   Epic    Worried About Programme Researcher, Broadcasting/film/video in the Last Year: Patient declined    Barista in the Last Year: Patient declined  Transportation Needs: No Transportation Needs (10/19/2024)   Epic    Lack of Transportation  (Medical): No    Lack of Transportation (Non-Medical): No  Physical Activity: Inactive (10/19/2024)   Exercise Vital Sign    Days of Exercise per Week: 0 days    Minutes of Exercise per Session: Not on file  Stress: No Stress Concern Present (10/19/2024)   [employer] of Occupational Health - Occupational Stress Questionnaire    Feeling of Stress: Only a little  Social Connections: Moderately Isolated (10/19/2024)   Social Connection and Isolation Panel    Frequency of Communication with Friends and Family: Twice a week    Frequency of Social Gatherings with Friends and Family: Once a week    Attends Religious Services: 1 to 4 times per year    Active Member of [employer] or Organizations: No    Attends Engineer, Structural: Not on file    Marital Status: Divorced  Depression (PHQ2-9): Medium Risk (07/22/2024)   Depression (PHQ2-9)    PHQ-2 Score: 9  Alcohol Screen: Not on file  Housing: Low Risk (10/19/2024)   Epic    Unable to Pay for Housing in the Last Year: No    Number of Times Moved in the Last Year: 0    Homeless in the Last Year: No  Utilities: Not on file  Health Literacy: Not on file    MEDICATIONS:  Current Outpatient Medications  Medication Sig Dispense Refill   alendronate  (FOSAMAX ) 70 MG tablet TAKE 1 TABLET EVERY 7 DAYS WITH FULL GLASS OF WATER ON EMPTY STOMACH. AVOID LAYING FLAT FOR 2 HOURS. 12 tablet 0   amoxicillin -clavulanate (AUGMENTIN ) 875-125 MG tablet Take 1 tablet by mouth 2 (two) times daily. 14 tablet 0   aspirin  81 MG tablet Take 81 mg by mouth daily.     atorvastatin  (LIPITOR) 10 MG tablet Take 1 tablet (10 mg  total) by mouth daily for cholesterol. 90 tablet 0   Blood Glucose Monitoring Suppl (GHT BLOOD GLUCOSE MONITOR) w/Device KIT 1 EACH BY DOES NOT APPLY ROUTE DAILY. MAY SUBSTITUTE TO ANY MANUFACTURER COVERED BY PATIENT'S INSURANCE. 1 kit 0   buPROPion  ER (WELLBUTRIN  SR) 100 MG 12 hr tablet Take 1 tablet (100 mg total) by mouth 2 (two) times  daily for depression 180 tablet 0   DULoxetine  (CYMBALTA ) 60 MG capsule TAKE 1 CAPSULE BY MOUTH EVERY DAY TO PREVENT HEADACHE 30 capsule 0   empagliflozin  (JARDIANCE ) 25 MG TABS tablet Take 1 tablet (25 mg total) by mouth daily before breakfast. 90 tablet 3   guaiFENesin (MUCINEX) 600 MG 12 hr tablet Take by mouth 2 (two) times daily as needed for cough.     Lancets Misc. MISC 1 each by Does not apply route daily. May substitute to any manufacturer covered by patient's insurance. 100 each 3   Lancets MISC 1 EACH BY DOES NOT APPLY ROUTE IN THE MORNING, AT NOON, AND AT BEDTIME. MAY SUBSTITUTE TO ANY MANUFACTURER COVERED BY PATIENT'S INSURANCE. 1 each 0   lansoprazole (EQ LANSOPRAZOLE) 15 MG capsule Take 15 mg by mouth 2 (two) times daily.     levothyroxine  (SYNTHROID ) 75 MCG tablet Take 1 tablet (75 mcg total) by mouth in the morning on an empty stomach with water only. No food/other medications for 30 minutes 90 tablet 0   magnesium  30 MG tablet Take 30 mg by mouth daily.     meloxicam (MOBIC) 7.5 MG tablet Take 7.5 mg by mouth daily. (Patient taking differently: Take 7.5 mg by mouth daily. Taking as needed right now)     methocarbamol (ROBAXIN) 500 MG tablet Take 500 mg by mouth every 6 (six) hours as needed for muscle spasms.     metoprolol  succinate (TOPROL -XL) 50 MG 24 hr tablet Take 1 tablet (50 mg total) by mouth daily. Take with or immediately following a meal. 90 tablet 1   metoprolol  tartrate (LOPRESSOR ) 25 MG tablet Take one (1) tablet by mouth ( 25 mg) Heart Rate greater than 100 or palpitations. 30 tablet 6   mirtazapine  (REMERON ) 30 MG tablet TAKE 1 TABLET (30 MG TOTAL) BY MOUTH AT BEDTIME FOR SLEEP. 30 tablet 0   Multiple Minerals-Vitamins (CALCIUM -MAGNESIUM -ZINC-D3 PO) Take 1 capsule by mouth daily.     nitroGLYCERIN  (NITROSTAT ) 0.4 MG SL tablet Dissolve 1 tablet under the tongue every 5 minutes as needed for chest pain. Max of 3 doses, then 911. 25 tablet 6   nystatin  cream  (MYCOSTATIN ) APPLY TO AFFECTED AREA TWICE A DAY AS NEEDED FOR DRY SKIN 30 g 0   ONETOUCH VERIO test strip 1 EACH BY IN VITRO ROUTE DAILY. MAY SUBSTITUTE TO ANY MANUFACTURER COVERED BY PATIENT'S INSURANCE. 100 strip 3   sacubitril -valsartan  (ENTRESTO ) 97-103 MG Take 1/2 tablet by mouth twice a day     torsemide  (DEMADEX ) 20 MG tablet Take 0.5 tablets (10 mg total) by mouth daily. 45 tablet 3   No current facility-administered medications for this visit.    PHYSICAL EXAM: Vitals:   01/06/25 1448  BP: 110/82  Pulse: 82  Resp: 16  SpO2: 95%  Weight: 253 lb 3.2 oz (114.9 kg)  Height: 5' 4.5 (1.638 m)   Body mass index is 42.79 kg/m.  Wt Readings from Last 3 Encounters:  01/06/25 253 lb 3.2 oz (114.9 kg)  10/14/24 252 lb (114.3 kg)  08/13/24 255 lb 11.7 oz (116 kg)    General: Well  developed, well nourished female in no apparent distress.  HEENT: AT/[place], no external lesions.  Eyes: Conjunctiva clear and no icterus. Neck: Neck supple  Lungs: Respirations not labored Neurologic: Alert, oriented, normal speech Extremities / Skin: Dry.  No spine tenderness. Psychiatric: Does not appear depressed or anxious  Diabetic Foot Exam - Simple   Simple Foot Form Diabetic Foot exam was performed with the following findings: Yes 01/06/2025  3:18 PM  Visual Inspection No deformities, no ulcerations, no other skin breakdown bilaterally: Yes See comments: Yes Sensation Testing Intact to touch and monofilament testing bilaterally: Yes Pulse Check Posterior Tibialis and Dorsalis pulse intact bilaterally: Yes Comments Left great toe medically callus +     LABS Reviewed Lab Results  Component Value Date   HGBA1C 6.3 (A) 01/06/2025   HGBA1C 6.4 04/09/2024   HGBA1C 6.9 (H) 01/01/2024   No results found for: FRUCTOSAMINE Lab Results  Component Value Date   CHOL 137 01/06/2025   HDL 37 (L) 01/06/2025   LDLCALC 53 01/06/2025   LDLDIRECT 72.0 06/23/2021   TRIG 393 (H) 01/06/2025    CHOLHDL 3.7 01/06/2025   Lab Results  Component Value Date   MICRALBCREAT NOTE 01/06/2025   Lab Results  Component Value Date   CREATININE 1.05 (H) 01/06/2025   Lab Results  Component Value Date   GFR 54.23 (L) 04/09/2024    ASSESSMENT / PLAN  1. Type 2 diabetes mellitus with hyperglycemia, without long-term current use of insulin (HCC)   2. Elevated serum alkaline phosphatase level   3. Vitamin D  deficiency   4. Postsurgical hypothyroidism   5. History of thyroid  surgery   6. Mixed hyperlipidemia     Diabetes Mellitus type 2, complicated by no known complication. - Diabetic status / severity: Controlled.  Lab Results  Component Value Date   HGBA1C 6.3 (A) 01/06/2025    - Hemoglobin A1c goal : <6.5%  Controlled type 2 diabetes mellitus.  She had intolerance with GLP-1 receptor agonist in the past.  -MEDICATIONS: See below.  I) continue Jardiance  25 mg daily.  - Home glucose testing: Advised to check at least in the morning fasting.  Test supplies including glucometer prescribed.  - Discussed/ Gave Hypoglycemia treatment plan.  # Consult : not required at this time.   # Annual urine for microalbuminuria/ creatinine ratio, no microalbuminuria currently, continue ACE/ARB /valsartan .  Will check today.   Last  Lab Results  Component Value Date   MICRALBCREAT NOTE 01/06/2025    # Foot check nightly.  # Annual dilated diabetic eye exams.   - Diet: Make healthy diabetic food choices. - Life style / activity / exercise: Discussed.  2. Blood pressure  -  BP Readings from Last 1 Encounters:  01/06/25 110/82    - Control is in target.  - No change in current plans.  3. Lipid status / Hyperlipidemia - Last  Lab Results  Component Value Date   LDLCALC 53 01/06/2025   - Continue atorvastatin  10 mg daily.  Managed by primary care provider. - Lipid panel today.  # Osteoporosis on Fosamax , managed by primary care provider.  She is currently on calcium  and  vitamin D  supplement.  # Postsurgical hypothyroidism, status post left lobectomy in early 2000, used to manage by primary care provider.  On levothyroxine  75 mcg daily.  She wants to follow-up thyroid  disorder in this clinic. - Check thyroid  function test today.  # Elevated alkaline phosphatase : - Unclear reason at this time.  She had  normal liver enzymes and GGT in the past. -She had mildly elevated alkaline phosphatase along with normal bone specific alkaline phosphatase in May 2025. -Will continue to monitor.  Recheck lab for alkaline phosphatase, vitamin D  and BMP today. - Will consider bone scan if other causes for elevated alkaline phosphatase is negative to rule out for Paget's disease.   Diagnoses and all orders for this visit:  Type 2 diabetes mellitus with hyperglycemia, without long-term current use of insulin (HCC) -     Basic metabolic panel with GFR -     Microalbumin / creatinine urine ratio -     POCT glycosylated hemoglobin (Hb A1C)  Elevated serum alkaline phosphatase level -     Alkaline phosphatase -     VITAMIN D  25 Hydroxy (Vit-D Deficiency, Fractures)  Vitamin D  deficiency -     VITAMIN D  25 Hydroxy (Vit-D Deficiency, Fractures)  Postsurgical hypothyroidism -     T4, free -     TSH  History of thyroid  surgery  Mixed hyperlipidemia -     Lipid panel   DISPOSITION Follow up in clinic in 4 months suggested.   All questions answered and patient verbalized understanding of the plan.  Constantine Ruddick, MD [facility] Endocrinology [facility] [address] [place], [address] [place], [place] [address] Phone # [phone]  At least part of this note was generated using voice recognition software. Inadvertent word errors may have occurred, which were not recognized during the proofreading process. "

## 2025-01-07 LAB — T4, FREE: Free T4: 1.2 ng/dL (ref 0.8–1.8)

## 2025-01-07 LAB — BASIC METABOLIC PANEL WITH GFR
BUN/Creatinine Ratio: 16 (calc) (ref 6–22)
BUN: 17 mg/dL (ref 7–25)
CO2: 26 mmol/L (ref 20–32)
Calcium: 9.5 mg/dL (ref 8.6–10.4)
Chloride: 104 mmol/L (ref 98–110)
Creat: 1.05 mg/dL — ABNORMAL HIGH (ref 0.60–1.00)
Glucose, Bld: 102 mg/dL (ref 65–139)
Potassium: 4.8 mmol/L (ref 3.5–5.3)
Sodium: 139 mmol/L (ref 135–146)
eGFR: 56 mL/min/1.73m2 — ABNORMAL LOW

## 2025-01-07 LAB — MICROALBUMIN / CREATININE URINE RATIO
Creatinine, Urine: 28 mg/dL (ref 20–275)
Microalb, Ur: <0.2 mg/dL

## 2025-01-07 LAB — LIPID PANEL
Cholesterol: 137 mg/dL
HDL: 37 mg/dL — ABNORMAL LOW
LDL Cholesterol (Calc): 53 mg/dL
Non-HDL Cholesterol (Calc): 100 mg/dL
Total CHOL/HDL Ratio: 3.7 (calc)
Triglycerides: 393 mg/dL — ABNORMAL HIGH

## 2025-01-07 LAB — ALKALINE PHOSPHATASE: Alkaline phosphatase (APISO): 163 U/L — ABNORMAL HIGH (ref 37–153)

## 2025-01-07 LAB — TSH: TSH: 2.63 m[IU]/L (ref 0.40–4.50)

## 2025-01-07 LAB — VITAMIN D 25 HYDROXY (VIT D DEFICIENCY, FRACTURES): Vit D, 25-Hydroxy: 31 ng/mL (ref 30–100)

## 2025-01-08 MED ORDER — LEVOTHYROXINE SODIUM 75 MCG PO TABS: 75.0000 ug | ORAL_TABLET | Freq: Every morning | ORAL | 3 refills | Status: AC

## 2025-01-20 ENCOUNTER — Encounter (HOSPITAL_BASED_OUTPATIENT_CLINIC_OR_DEPARTMENT_OTHER): Payer: Self-pay | Admitting: Cardiology

## 2025-01-20 ENCOUNTER — Ambulatory Visit (INDEPENDENT_AMBULATORY_CARE_PROVIDER_SITE_OTHER): Admitting: Cardiology

## 2025-01-20 VITALS — BP 112/70 | HR 73 | Ht 64.0 in | Wt 252.0 lb

## 2025-01-20 DIAGNOSIS — R002 Palpitations: Secondary | ICD-10-CM

## 2025-01-20 DIAGNOSIS — E66813 Obesity, class 3: Secondary | ICD-10-CM | POA: Diagnosis not present

## 2025-01-20 DIAGNOSIS — I10 Essential (primary) hypertension: Secondary | ICD-10-CM | POA: Diagnosis not present

## 2025-01-20 DIAGNOSIS — I5032 Chronic diastolic (congestive) heart failure: Secondary | ICD-10-CM

## 2025-01-20 DIAGNOSIS — E782 Mixed hyperlipidemia: Secondary | ICD-10-CM | POA: Diagnosis not present

## 2025-01-20 DIAGNOSIS — E118 Type 2 diabetes mellitus with unspecified complications: Secondary | ICD-10-CM | POA: Diagnosis not present

## 2025-01-20 DIAGNOSIS — Z6841 Body Mass Index (BMI) 40.0 and over, adult: Secondary | ICD-10-CM | POA: Diagnosis not present

## 2025-01-20 DIAGNOSIS — I447 Left bundle-branch block, unspecified: Secondary | ICD-10-CM

## 2025-01-20 MED ORDER — SACUBITRIL-VALSARTAN 49-51 MG PO TABS: 1.0000 | ORAL_TABLET | Freq: Two times a day (BID) | ORAL | 3 refills | Status: AC

## 2025-01-20 NOTE — Patient Instructions (Signed)
 We are going to change your entresto  dose--you can finish the 97-103 mg dose (cut in half, take 1/2 pill twice a day) and I sent the next prescription in at a lower dose, so when you pick that up don't cut those in half. We are not going to add back the spironolactone  right now, but if you notice the blood pressure creeping up let us  know.  Medication Instructions:  1.) decrease Entresto  to 49-51 mg --one tablet twice daily  *If you need a refill on your cardiac medications before your next appointment, please call your pharmacy*  Lab Work: none  Testing/Procedures: none  Follow-Up: At [facility], you and your health needs are our priority.  As part of our continuing mission to provide you with exceptional heart care, our providers are all part of one team.  This team includes your primary Cardiologist (physician) and Advanced Practice Providers or APPs (Physician Assistants and Nurse Practitioners) who all work together to provide you with the care you need, when you need it.  Your next appointment:   6 month(s)  Provider:   Dr. Lonni or Rosaline Bane, NP

## 2025-01-22 ENCOUNTER — Other Ambulatory Visit: Payer: Self-pay | Admitting: Primary Care

## 2025-01-22 DIAGNOSIS — G43009 Migraine without aura, not intractable, without status migrainosus: Secondary | ICD-10-CM

## 2025-02-12 ENCOUNTER — Ambulatory Visit

## 2025-03-03 ENCOUNTER — Ambulatory Visit

## 2025-05-06 ENCOUNTER — Ambulatory Visit: Admitting: Endocrinology
